# Patient Record
Sex: Female | Born: 1955 | Race: White | Hispanic: No | Marital: Married | Smoking: Never smoker
Health system: Southern US, Community
[De-identification: ages and names within clinical notes are randomized; demographics above are authoritative.]

## PROBLEM LIST (undated history)

## (undated) DIAGNOSIS — G510 Bell's palsy: Secondary | ICD-10-CM

## (undated) DIAGNOSIS — E119 Type 2 diabetes mellitus without complications: Secondary | ICD-10-CM

## (undated) DIAGNOSIS — K219 Gastro-esophageal reflux disease without esophagitis: Secondary | ICD-10-CM

## (undated) DIAGNOSIS — D649 Anemia, unspecified: Secondary | ICD-10-CM

## (undated) DIAGNOSIS — E785 Hyperlipidemia, unspecified: Secondary | ICD-10-CM

## (undated) HISTORY — PX: TONSILLECTOMY: SUR1361

## (undated) HISTORY — DX: Gastro-esophageal reflux disease without esophagitis: K21.9

## (undated) HISTORY — DX: Bell's palsy: G51.0

## (undated) HISTORY — DX: Type 2 diabetes mellitus without complications: E11.9

## (undated) HISTORY — DX: Hyperlipidemia, unspecified: E78.5

---
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
## 2003-06-12 ENCOUNTER — Other Ambulatory Visit: Payer: Self-pay

## 2004-02-14 ENCOUNTER — Ambulatory Visit: Payer: Self-pay | Admitting: Unknown Physician Specialty

## 2006-10-18 ENCOUNTER — Encounter: Payer: Self-pay | Admitting: Physician Assistant

## 2006-11-12 ENCOUNTER — Encounter: Payer: Self-pay | Admitting: Physician Assistant

## 2007-06-17 ENCOUNTER — Ambulatory Visit: Payer: Self-pay | Admitting: Sports Medicine

## 2014-11-28 ENCOUNTER — Encounter: Payer: 59 | Attending: Internal Medicine | Admitting: Dietician

## 2014-11-28 VITALS — Ht 65.0 in | Wt 253.2 lb

## 2014-11-28 DIAGNOSIS — Z713 Dietary counseling and surveillance: Secondary | ICD-10-CM | POA: Diagnosis present

## 2014-11-28 DIAGNOSIS — E669 Obesity, unspecified: Secondary | ICD-10-CM

## 2014-11-28 NOTE — Progress Notes (Signed)
Notes from [facility] employee "self referral" nutrition session: Start time: 1115   End time: 3524  Met with employee to discuss his/her nutritional concerns and diet history. The employee's questions/concerns were also addressed.  We discussed the following topics:  Healthy Eating  Weight Concerns   I also provided the following handouts as reinforcement of the educational session:  Planning a Balanced Meal   Additional Comments: Patient has been losing weight through regular exercise (30-80 minutes, 5 days per week), as well as reducing sugar and starch intake. She is tracking calories, and consuming less than the website-advised 2300kcal. Calculated needs for weight loss at 1800kcal daily, provided patient with meal plan. Discussed protein options for meals and snacks, as patient feels her midday protein intake might be low.    Goals: She will aim for 1800kcal daily, and include protein sources at regular intervals during the day.

## 2015-01-21 ENCOUNTER — Encounter: Payer: Self-pay | Admitting: Physician Assistant

## 2015-01-21 ENCOUNTER — Ambulatory Visit: Payer: Self-pay | Admitting: Physician Assistant

## 2015-01-21 VITALS — BP 130/80 | Temp 98.5°F | Wt 265.0 lb

## 2015-01-21 DIAGNOSIS — J018 Other acute sinusitis: Secondary | ICD-10-CM

## 2015-01-21 DIAGNOSIS — K2100 Gastro-esophageal reflux disease with esophagitis, without bleeding: Secondary | ICD-10-CM

## 2015-01-21 MED ORDER — FLUCONAZOLE 150 MG PO TABS: 150.0000 mg | ORAL_TABLET | Freq: Once | ORAL | Status: DC

## 2015-01-21 MED ORDER — AMOXICILLIN 875 MG PO TABS: 875.0000 mg | ORAL_TABLET | Freq: Two times a day (BID) | ORAL | Status: DC

## 2015-01-21 MED ORDER — OMEPRAZOLE 40 MG PO CPDR: 40.0000 mg | DELAYED_RELEASE_CAPSULE | Freq: Every day | ORAL | Status: DC

## 2015-01-21 NOTE — Progress Notes (Signed)
S: C/o runny nose and congestion for 3 days, no fever, chills, cp/sob, v/d; mucus is green and thick, cough is sporadic, c/o of facial and dental pain. Also med refill on prilosec  Using otc meds:   O: PE: perrl eomi, normocephalic, tms dull, nasal mucosa red and swollen, throat injected, neck supple no lymph, lungs c t a, cv rrr, neuro intact  A:  Acute sinusitis   P:amoxil 862m bid x 10d, dilfucan 1557mone now and 1 in 1 week, refill on omeprazole 4029md , urged pt once again to get pcp;  drink fluids, continue regular meds , use otc meds of choice, return if not improving in 5 days, return earlier if worsening

## 2015-01-21 NOTE — Progress Notes (Deleted)
   Subjective:    [name], female    DOB: 1955/08/05, 59 y.o.   MRN: [id number]  HPI    Review of Systems     Objective:   Physical Exam        Assessment & Plan:

## 2015-02-13 ENCOUNTER — Ambulatory Visit: Payer: Self-pay | Admitting: Physician Assistant

## 2015-02-13 ENCOUNTER — Encounter: Payer: Self-pay | Admitting: Physician Assistant

## 2015-02-13 VITALS — BP 142/90 | HR 100 | Temp 100.1°F

## 2015-02-13 DIAGNOSIS — B029 Zoster without complications: Secondary | ICD-10-CM

## 2015-02-13 DIAGNOSIS — R509 Fever, unspecified: Secondary | ICD-10-CM

## 2015-02-13 LAB — POCT URINALYSIS DIPSTICK
BILIRUBIN UA: NEGATIVE
Blood, UA: NEGATIVE
Glucose, UA: NEGATIVE
KETONES UA: NEGATIVE
Nitrite, UA: NEGATIVE
PH UA: 6
PROTEIN UA: NEGATIVE
Spec Grav, UA: 1.025
Urobilinogen, UA: 0.2

## 2015-02-13 MED ORDER — HYDROCODONE-ACETAMINOPHEN 5-325 MG PO TABS: 1.0000 | ORAL_TABLET | Freq: Four times a day (QID) | ORAL | Status: DC | PRN

## 2015-02-13 MED ORDER — CIPROFLOXACIN HCL 250 MG PO TABS: 250.0000 mg | ORAL_TABLET | Freq: Two times a day (BID) | ORAL | Status: DC

## 2015-02-13 MED ORDER — FAMCICLOVIR 500 MG PO TABS: 500.0000 mg | ORAL_TABLET | Freq: Three times a day (TID) | ORAL | Status: DC

## 2015-02-13 NOTE — Progress Notes (Signed)
S: c/o headache on r side of head, coming up from neck, fever/chills, mild cough started today, hx of pneumonia, no v/d, no eye pain, did get shingles vaccine at age ?52, sx for 2 days  O: vitals wnl except temp elevated at 100, pt appears flushed, scalp tender on r side along temperal side of head, has a few red bumps in area but no vesicles, tms clear, throat clear, neck supple no lymph, lungs c t a, cv rrr, cough is dry; ua + trace leuks  A: fever, uti, ?shingles  P: cipro 254m bid x 7d, famvir 5089mtid, vicodin 5/325 #20 nr; pt instructed to go to ER if fever worsening and headache worsening, signs and sx of meningitis explained. Pt states she understands. Work note given for 11-2 thru 11-4

## 2015-02-18 ENCOUNTER — Ambulatory Visit: Payer: Self-pay | Admitting: Physician Assistant

## 2015-02-18 ENCOUNTER — Encounter: Payer: Self-pay | Admitting: Physician Assistant

## 2015-02-18 VITALS — BP 119/75 | HR 84 | Temp 98.4°F

## 2015-02-18 DIAGNOSIS — J018 Other acute sinusitis: Secondary | ICD-10-CM

## 2015-02-18 DIAGNOSIS — B029 Zoster without complications: Secondary | ICD-10-CM

## 2015-02-18 MED ORDER — OXYCODONE-ACETAMINOPHEN 10-325 MG PO TABS: 1.0000 | ORAL_TABLET | Freq: Three times a day (TID) | ORAL | Status: DC | PRN

## 2015-02-18 MED ORDER — GABAPENTIN 300 MG PO CAPS: 300.0000 mg | ORAL_CAPSULE | Freq: Two times a day (BID) | ORAL | Status: DC

## 2015-02-18 MED ORDER — FLUCONAZOLE 150 MG PO TABS: 150.0000 mg | ORAL_TABLET | Freq: Once | ORAL | Status: DC

## 2015-02-18 NOTE — Progress Notes (Signed)
S: here for recheck, states pain is the same, fever has gone away, taking meds as rx'd , pain meds don't seem to help, has 1.5 days of meds left, now pain is on entire r side of face and back of eye, no v  O: vitals wnl, nad, skin with a few red places in scalp, none on skin, perrl eomi, lungs c t a, cv rrr  A: shingles  P: neurontin 336m bid, percocet 10/325 #20 nr, diflucan for yeast , finish other rx meds, stop vicodin

## 2015-02-27 ENCOUNTER — Encounter: Payer: Self-pay | Admitting: Physician Assistant

## 2015-02-27 ENCOUNTER — Ambulatory Visit: Payer: Self-pay | Admitting: Physician Assistant

## 2015-02-27 VITALS — BP 138/80 | HR 76

## 2015-02-27 DIAGNOSIS — B029 Zoster without complications: Secondary | ICD-10-CM

## 2015-02-27 DIAGNOSIS — B0229 Other postherpetic nervous system involvement: Secondary | ICD-10-CM

## 2015-02-27 MED ORDER — TRAMADOL HCL 50 MG PO TABS: 50.0000 mg | ORAL_TABLET | Freq: Three times a day (TID) | ORAL | Status: DC | PRN

## 2015-02-27 NOTE — Progress Notes (Signed)
S / lesions have dried in , feeling well but has residual pain to scalp not relieved with tylenol  Is ready to RTW but will not be able to take her neurontin in am  O VSS no active lesions  A/ Shingles post herpetic neuralgia P continue neurontin . rx ultram 50 mg i po q 8 hours prn #30 o rf with precautions. RTW Monday Mar 04 2015

## 2015-04-23 ENCOUNTER — Other Ambulatory Visit: Payer: Self-pay | Admitting: Physician Assistant

## 2015-06-17 ENCOUNTER — Ambulatory Visit: Payer: Self-pay | Admitting: Physician Assistant

## 2015-06-17 ENCOUNTER — Encounter: Payer: Self-pay | Admitting: Physician Assistant

## 2015-06-17 VITALS — BP 142/80 | HR 80 | Temp 98.3°F

## 2015-06-17 DIAGNOSIS — J329 Chronic sinusitis, unspecified: Secondary | ICD-10-CM

## 2015-06-17 DIAGNOSIS — J302 Other seasonal allergic rhinitis: Secondary | ICD-10-CM

## 2015-06-18 NOTE — Progress Notes (Signed)
See note in paper chart by Daryll Drown, PAC

## 2015-08-19 ENCOUNTER — Ambulatory Visit: Payer: Self-pay | Admitting: Physician Assistant

## 2015-08-19 ENCOUNTER — Encounter: Payer: Self-pay | Admitting: Physician Assistant

## 2015-08-19 VITALS — BP 154/80 | HR 80 | Temp 98.1°F

## 2015-08-19 DIAGNOSIS — M25532 Pain in left wrist: Secondary | ICD-10-CM

## 2015-08-19 MED ORDER — METHYLPREDNISOLONE 4 MG PO TBPK: ORAL_TABLET | ORAL | Status: DC

## 2015-08-19 NOTE — Progress Notes (Signed)
S: c/o left wrist pain, hx of carpal tunnel, has been wearing splints at night, doesn't wear them at work bc has to wash /sanitize her hands so much, is carrying a clipboard in the same arm while at work, wrist has gotten really inflamed, doesn't really want to go to ortho at this time  O: vitals wnl, nad, left wrist neg for bony tenderness, pt stiffly holding arm, no decreased rom, n/v intact  A: acute wrist pain with hx of carpal tunnel syndrome  P: medrol dose pack, ice, f/u with ortho

## 2015-10-31 DIAGNOSIS — M47816 Spondylosis without myelopathy or radiculopathy, lumbar region: Secondary | ICD-10-CM | POA: Diagnosis not present

## 2015-10-31 DIAGNOSIS — M545 Low back pain: Secondary | ICD-10-CM | POA: Diagnosis not present

## 2015-10-31 DIAGNOSIS — M79651 Pain in right thigh: Secondary | ICD-10-CM | POA: Diagnosis not present

## 2015-10-31 DIAGNOSIS — S79921A Unspecified injury of right thigh, initial encounter: Secondary | ICD-10-CM | POA: Diagnosis not present

## 2015-10-31 DIAGNOSIS — M79604 Pain in right leg: Secondary | ICD-10-CM | POA: Diagnosis not present

## 2016-03-02 DIAGNOSIS — H5213 Myopia, bilateral: Secondary | ICD-10-CM | POA: Diagnosis not present

## 2016-04-28 ENCOUNTER — Encounter: Payer: Self-pay | Admitting: Physician Assistant

## 2016-04-28 ENCOUNTER — Ambulatory Visit: Payer: Self-pay | Admitting: Physician Assistant

## 2016-04-28 VITALS — BP 122/80 | HR 96 | Temp 98.0°F

## 2016-04-28 DIAGNOSIS — J0100 Acute maxillary sinusitis, unspecified: Secondary | ICD-10-CM

## 2016-04-28 MED ORDER — FLUCONAZOLE 150 MG PO TABS: 150.0000 mg | ORAL_TABLET | Freq: Once | ORAL | 0 refills | Status: AC

## 2016-04-28 MED ORDER — AZITHROMYCIN 250 MG PO TABS: ORAL_TABLET | ORAL | 0 refills | Status: DC

## 2016-04-28 NOTE — Progress Notes (Signed)
S: C/o runny nose and congestion for 3 days, cold sx for 2 wekes, no fever, chills, cp/sob, v/d; mucus is green and thick, cough is sporadic, c/o of facial and dental pain.   Using otc meds:   O: PE: vitals wnl, nad, perrl eomi, normocephalic, tms dull, nasal mucosa red and swollen, throat injected, neck supple no lymph, lungs c t a, cv rrr, neuro intact  A:  Acute sinusitis   P: drink fluids, continue regular meds , use otc meds of choice, return if not improving in 5 days, return earlier if worsening, zpack, diflucan if needed

## 2016-05-12 ENCOUNTER — Ambulatory Visit: Payer: Self-pay | Admitting: Physician Assistant

## 2016-05-12 ENCOUNTER — Encounter: Payer: Self-pay | Admitting: Physician Assistant

## 2016-05-12 VITALS — BP 140/82 | HR 98 | Temp 98.0°F

## 2016-05-12 DIAGNOSIS — J0100 Acute maxillary sinusitis, unspecified: Secondary | ICD-10-CM

## 2016-05-12 MED ORDER — AMOXICILLIN 875 MG PO TABS: 875.0000 mg | ORAL_TABLET | Freq: Two times a day (BID) | ORAL | 0 refills | Status: DC

## 2016-05-12 MED ORDER — FLUCONAZOLE 150 MG PO TABS: ORAL_TABLET | ORAL | 0 refills | Status: DC

## 2016-05-12 MED ORDER — PREDNISONE 10 MG PO TABS: 30.0000 mg | ORAL_TABLET | Freq: Every day | ORAL | 0 refills | Status: DC

## 2016-05-12 NOTE — Progress Notes (Signed)
S: C/o runny nose and congestion for 2-3 weeks, no fever, chills, cp/sob, v/d; mucus is green and thick, cough is sporadic, c/o of facial and dental pain. Finished the zpack, felt like it was working a little but didn't clear her up  Using otc meds: saline nose rinse, cough syrup  O: PE: vitals wnl, nad, perrl eomi, normocephalic, tms dull, nasal mucosa red and swollen, throat injected, neck supple no lymph, lungs c t a, cv rrr, neuro intact  A:  Acute sinusitis   P: drink fluids, continue regular meds , use otc meds of choice, return if not improving in 5 days, return earlier if worsening , amoxil 8107m bid x 10d, diflucan x 2, prednisone 314mqd x 3d

## 2016-12-11 ENCOUNTER — Ambulatory Visit: Payer: Self-pay | Admitting: Physician Assistant

## 2016-12-11 VITALS — BP 119/70 | HR 84 | Temp 98.5°F | Resp 16

## 2016-12-11 DIAGNOSIS — E6609 Other obesity due to excess calories: Secondary | ICD-10-CM | POA: Insufficient documentation

## 2016-12-11 DIAGNOSIS — K219 Gastro-esophageal reflux disease without esophagitis: Secondary | ICD-10-CM | POA: Insufficient documentation

## 2016-12-11 DIAGNOSIS — R7309 Other abnormal glucose: Secondary | ICD-10-CM

## 2016-12-11 DIAGNOSIS — J309 Allergic rhinitis, unspecified: Secondary | ICD-10-CM | POA: Insufficient documentation

## 2016-12-11 DIAGNOSIS — R350 Frequency of micturition: Secondary | ICD-10-CM

## 2016-12-11 LAB — POCT URINALYSIS DIPSTICK
Bilirubin, UA: NEGATIVE
Ketones, UA: NEGATIVE
LEUKOCYTES UA: NEGATIVE
NITRITE UA: NEGATIVE
PH UA: 6 (ref 5.0–8.0)
Protein, UA: NEGATIVE
RBC UA: NEGATIVE
Spec Grav, UA: 1.025 (ref 1.010–1.025)
UROBILINOGEN UA: 0.2 U/dL

## 2016-12-11 LAB — GLUCOSE, POCT (MANUAL RESULT ENTRY): POC GLUCOSE: 259 mg/dL — AB (ref 70–99)

## 2016-12-11 NOTE — Progress Notes (Signed)
S: c/o burning, urgency, itching, some white vaginal discharge, ?odor, also some sinus pain and drainage, r ear is stopped up, no fever/chills, no cp/sob, hasn't eaten since 830 this morning, has been drinking more sodas than normal, no hx of diabetes  O: vitals wnl, nad, ent wnl, neck supple no lymph,l ungs c t a, cv rrr, ua 2+ glucose, fsbs 259  A: ?yeast and new onset diabetes, seasonal allergies  P: pt to stop using any sodas, is going to use her husband's glucometer to check fasting and nonfasting glucose levels, return with reading on Thursday and we will repeat fsbs, refer to pcp, otc meds for sinus drainage

## 2016-12-17 ENCOUNTER — Encounter: Payer: Self-pay | Admitting: Physician Assistant

## 2016-12-17 ENCOUNTER — Ambulatory Visit: Payer: Self-pay | Admitting: Physician Assistant

## 2016-12-17 VITALS — BP 118/80 | HR 92 | Temp 98.5°F

## 2016-12-17 DIAGNOSIS — R7309 Other abnormal glucose: Secondary | ICD-10-CM

## 2016-12-17 LAB — GLUCOSE, POCT (MANUAL RESULT ENTRY): POC GLUCOSE: 232 mg/dL — AB (ref 70–99)

## 2016-12-17 MED ORDER — METFORMIN HCL 500 MG PO TABS
500.0000 mg | ORAL_TABLET | Freq: Two times a day (BID) | ORAL | 3 refills | Status: DC
Start: 2016-12-17 — End: 2017-01-15

## 2016-12-17 NOTE — Progress Notes (Signed)
[name] spoke with [facility] appointment scheduled with Dr. Lavon Paganini on 01/15/2017 @ 9:00. Patient has been informed.

## 2016-12-17 NOTE — Progress Notes (Signed)
S: here for fsbs recheck, has list of fasting labs that are in the 110-130s, did not check after eating, had peanut butter toast before coming to clinic today  O: vitals wnl, nad, fsbs 232,   A: elevated glucose  P: pt to get A1C done at hospital, metformin 51m bid, we will set up appt with a pcp

## 2016-12-18 ENCOUNTER — Other Ambulatory Visit
Admission: RE | Admit: 2016-12-18 | Discharge: 2016-12-18 | Disposition: A | Discharge status: Home / Self Care | Payer: 59 | Source: Ambulatory Visit | Attending: Family Medicine | Admitting: Family Medicine

## 2016-12-18 DIAGNOSIS — R7309 Other abnormal glucose: Secondary | ICD-10-CM | POA: Diagnosis not present

## 2016-12-18 LAB — HEMOGLOBIN A1C
Hgb A1c MFr Bld: 9.4 % — ABNORMAL HIGH (ref 4.8–5.6)
Mean Plasma Glucose: 223.08 mg/dL

## 2017-01-15 ENCOUNTER — Encounter: Payer: Self-pay | Admitting: Family Medicine

## 2017-01-15 ENCOUNTER — Ambulatory Visit (INDEPENDENT_AMBULATORY_CARE_PROVIDER_SITE_OTHER): Payer: 59 | Admitting: Family Medicine

## 2017-01-15 VITALS — BP 120/82 | HR 76 | Temp 98.1°F | Resp 16 | Ht 65.0 in | Wt 237.0 lb

## 2017-01-15 DIAGNOSIS — K219 Gastro-esophageal reflux disease without esophagitis: Secondary | ICD-10-CM

## 2017-01-15 DIAGNOSIS — Z124 Encounter for screening for malignant neoplasm of cervix: Secondary | ICD-10-CM | POA: Diagnosis not present

## 2017-01-15 DIAGNOSIS — J309 Allergic rhinitis, unspecified: Secondary | ICD-10-CM

## 2017-01-15 DIAGNOSIS — E669 Obesity, unspecified: Secondary | ICD-10-CM

## 2017-01-15 DIAGNOSIS — Z1159 Encounter for screening for other viral diseases: Secondary | ICD-10-CM | POA: Diagnosis not present

## 2017-01-15 DIAGNOSIS — E119 Type 2 diabetes mellitus without complications: Secondary | ICD-10-CM | POA: Diagnosis not present

## 2017-01-15 DIAGNOSIS — E1169 Type 2 diabetes mellitus with other specified complication: Secondary | ICD-10-CM | POA: Insufficient documentation

## 2017-01-15 DIAGNOSIS — Z7689 Persons encountering health services in other specified circumstances: Secondary | ICD-10-CM

## 2017-01-15 DIAGNOSIS — E785 Hyperlipidemia, unspecified: Secondary | ICD-10-CM

## 2017-01-15 DIAGNOSIS — Z114 Encounter for screening for human immunodeficiency virus [HIV]: Secondary | ICD-10-CM | POA: Diagnosis not present

## 2017-01-15 DIAGNOSIS — Z6839 Body mass index (BMI) 39.0-39.9, adult: Secondary | ICD-10-CM

## 2017-01-15 LAB — POCT UA - MICROALBUMIN: MICROALBUMIN (UR) POC: 50 mg/L

## 2017-01-15 MED ORDER — OMEPRAZOLE 20 MG PO CPDR: 20.0000 mg | DELAYED_RELEASE_CAPSULE | Freq: Every day | ORAL | 3 refills | Status: DC

## 2017-01-15 MED ORDER — METFORMIN HCL 1000 MG PO TABS: 1000.0000 mg | ORAL_TABLET | Freq: Two times a day (BID) | ORAL | 2 refills | Status: DC

## 2017-01-15 NOTE — Assessment & Plan Note (Signed)
Well-controlled Continue Claritin as needed

## 2017-01-15 NOTE — Assessment & Plan Note (Signed)
No lipid panel on file Not on any medications Check CMP, lipid panel today

## 2017-01-15 NOTE — Assessment & Plan Note (Signed)
Well-controlled with occasional Prilosec Discussed dietary changes and patient is trying to not eat after dinner which seems to contribute We will decrease omeprazole dose to 20 mg daily as needed Discuss long-term consequences of PPI therapy including osteoporosis and kidney disease Could consider H2 blocker in the future

## 2017-01-15 NOTE — Patient Instructions (Signed)

## 2017-01-15 NOTE — Assessment & Plan Note (Signed)
Discussed diet and exercise and healthy weight management

## 2017-01-15 NOTE — Progress Notes (Signed)
Patient: Cindy Trujillo, Female    DOB: 06-15-1955, 61 y.o.   MRN: [id number] Visit Date: 01/15/2017  Today's Provider: Lavon Paganini, MD   Chief Complaint  Patient presents with  . Establish Care   Subjective:   Mrs. Fiore is establishing care. She has never had a PCP. 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 She is c/o GERD, and requesting a 90 day prescription of Omeprazole 40 mg. Had her flu shot through the hospital in September. Has not had pap in 10 years. Last colonoscopy was 9 years ago per pt, and was performed by Dr. Vira Agar. She has never had a mammogram, but performs a monthly self breast exam. Denies changes in breast exam. She feels fairly well. She reports she has not been exercising for the last year while she was caring for mother. She reports she is sleeping fairly well. Has not been sleeping well since mother died last month.  T2DM - Checking BG at home: no - does not have meter - plans to sign up for [employer] through work insurance to get free testing supplies - Medications: metformin 518m  - Compliance: was prescribed BID, but patient misunderstood so she was only taking daily - Diet: trying to cut back on sweets and sodas - backslid while mother was terminally ill - Exercise: 3-4 times weekly at gym - backslid while mother was terminally ill - [facility] exam: [facility] f/u exam soon - foot exam: need - microalbumin: needs - denies symptoms of hypoglycemia, polyuria, polydipsia, numbness extremities, foot ulcers/trauma - has not had any nutritional diabetes education   Review of Systems  Constitutional: Positive for activity change. Negative for appetite change, chills, diaphoresis, fatigue, fever and unexpected weight change.  HENT: Positive for postnasal drip and sneezing. Negative for congestion, dental problem, drooling, ear discharge, ear pain, facial swelling, hearing loss, mouth sores, nosebleeds, rhinorrhea, sinus pain,  sinus pressure, sore throat, tinnitus, trouble swallowing and voice change.   Eyes: Negative.   Respiratory: Negative.   Cardiovascular: Positive for leg swelling. Negative for chest pain and palpitations.  Gastrointestinal: Negative.   Endocrine: Negative.   Genitourinary: Negative.   Musculoskeletal: Negative.   Skin: Negative.   Allergic/Immunologic: Negative.   Neurological: Positive for headaches. Negative for dizziness, tremors, seizures, syncope, facial asymmetry, speech difficulty, weakness, light-headedness and numbness.  Hematological: Negative.   Psychiatric/Behavioral: Positive for sleep disturbance. Negative for agitation, behavioral problems, confusion, decreased concentration, dysphoric mood, hallucinations, self-injury and suicidal ideas. The patient is not nervous/anxious and is not hyperactive.     Social History      She  reports that she has never smoked. She has never used smokeless tobacco. She reports that she does not drink alcohol or use drugs.       Social History   Social History  . Marital status: Married    Spouse name: RFrancee Piccolo . Number of children: 0  . Years of education: some college   Occupational History  . [employer]   Social History Main Topics  . Smoking status: Never Smoker  . Smokeless tobacco: Never Used  . Alcohol use No  . Drug use: No  . Sexual activity: Not Currently   Other Topics Concern  . None   Social History Narrative  . None    Past Medical History:  Diagnosis Date  . Diabetes mellitus without complication ([place]   . GERD (gastroesophageal reflux disease)   . Hyperlipidemia  Patient Active Problem List   Diagnosis Date Noted  . Diabetes mellitus without complication ([place])   . Hyperlipidemia   . Allergic rhinitis 12/11/2016  . GERD (gastroesophageal reflux disease) 12/11/2016  . Obesity 12/11/2016    Past Surgical History:  Procedure Laterality Date  . TONSILLECTOMY AND  ADENOIDECTOMY  1968    Family History        Family Status  Relation Status  . Mother Deceased       complications from lung disease  . Father Alive  . Sister Alive  . Brother Alive  . Sister Alive  . Sister Alive  . Brother Alive  . MGM (Not Specified)  . MGF (Not Specified)  . PGM (Not Specified)  . PGF (Not Specified)  . Neg Hx (Not Specified)        Her family history includes COPD in her brother, father, mother, sister, sister, and sister; Cancer in her paternal grandmother; Fibromyalgia in her mother; Healthy in her brother; Heart attack in her maternal grandfather and paternal grandfather; Heart failure in her maternal grandmother; Melanoma in her father; Pulmonary fibrosis in her mother.     Allergies  Allergen Reactions  . Sulfa Antibiotics Hives     Current Outpatient Prescriptions:  .  loratadine (CLARITIN) 10 MG tablet, Take 10 mg by mouth daily., Disp: , Rfl:  .  metFORMIN (GLUCOPHAGE) 1000 MG tablet, Take 1 tablet (1,000 mg total) by mouth 2 (two) times daily with a meal., Disp: 180 tablet, Rfl: 2 .  omeprazole (PRILOSEC) 20 MG capsule, Take 1 capsule (20 mg total) by mouth daily., Disp: 90 capsule, Rfl: 3   Patient Care Team: Virginia Crews, MD as PCP - General (Family Medicine)      Objective:   Vitals: BP 120/82 (BP Location: Left Arm, Patient Position: Sitting, Cuff Size: Large)   Pulse 76   Temp 98.1 F (36.7 C) (Oral)   Resp 16   Ht 5' 5"  (1.651 m)   Wt 237 lb (107.5 kg)   BMI 39.44 kg/m    Vitals:   01/15/17 0908  BP: 120/82  Pulse: 76  Resp: 16  Temp: 98.1 F (36.7 C)  TempSrc: Oral  Weight: 237 lb (107.5 kg)  Height: 5' 5"  (1.651 m)     Physical Exam  Constitutional: She is oriented to person, place, and time. She appears well-developed and well-nourished. No distress.  HENT:  Head: Normocephalic and atraumatic.  Right Ear: External ear normal.  Left Ear: External ear normal.  Nose: Nose normal.  Mouth/Throat:  Oropharynx is clear and moist.  Eyes: Pupils are equal, round, and reactive to light. Conjunctivae are normal. No scleral icterus.  Neck: Neck supple. No thyromegaly present.  Cardiovascular: Normal rate, regular rhythm, normal heart sounds and intact distal pulses.   No murmur heard. Pulmonary/Chest: Effort normal and breath sounds normal. No respiratory distress. She has no wheezes. She has no rales.  Abdominal: Soft. Bowel sounds are normal. She exhibits no distension. There is no tenderness. There is no rebound and no guarding.  Genitourinary:  Genitourinary Comments: GYN:  External genitalia within normal limits.  Vaginal mucosa pink, moist, normal rugae.  Nonfriable cervix without lesions, no discharge or bleeding noted on speculum exam.  Bimanual exam revealed normal, nongravid uterus.  No cervical motion tenderness. No adnexal masses bilaterally.    Musculoskeletal: She exhibits no edema or deformity.  Lymphadenopathy:    She has no cervical adenopathy.  Neurological: She is alert and oriented to  person, place, and time.  Skin: Skin is warm and dry. No rash noted.  Psychiatric: She has a normal mood and affect. Her behavior is normal.  Vitals reviewed.    Depression Screen PHQ 2/9 Scores 01/15/2017  PHQ - 2 Score 0     Assessment & Plan:    Problem List Items Addressed This Visit      Respiratory   Allergic rhinitis    Well-controlled Continue Claritin as needed        Digestive   GERD (gastroesophageal reflux disease)    Well-controlled with occasional Prilosec Discussed dietary changes and patient is trying to not eat after dinner which seems to contribute We will decrease omeprazole dose to 20 mg daily as needed Discuss long-term consequences of PPI therapy including osteoporosis and kidney disease Could consider H2 blocker in the future      Relevant Medications   omeprazole (PRILOSEC) 20 MG capsule   Other Relevant Orders   CBC w/Diff/Platelet      Endocrine   Diabetes mellitus without complication ([place])    New diagnosis for the patient, uncontrolled Last A1c 9.4 Increase metformin to 500 mg twice daily If tolerating without significant GI effects after one week, can increase to 1000 mg twice a day New prescription sent to pharmacy Foot exam completed today Patient schedule eye exam Referral to nutrition for new diabetic teaching Discussed diet and exercise Discuss long-term consequences of uncontrolled diabetes and cold for A1c Urine microalbumin today We will update vaccines at upcoming CPE in one month  follow-up with recheck A1c in 2 months      Relevant Medications   metFORMIN (GLUCOPHAGE) 1000 MG tablet   Other Relevant Orders   Amb ref to Medical Nutrition Therapy-MNT   POCT UA - Microalbumin (Completed)     Other   Obesity    Discussed diet and exercise and healthy weight management      Relevant Medications   metFORMIN (GLUCOPHAGE) 1000 MG tablet   Other Relevant Orders   CBC w/Diff/Platelet   TSH   Hyperlipidemia    No lipid panel on file Not on any medications Check CMP, lipid panel today      Relevant Orders   Lipid panel   COMPLETE METABOLIC PANEL WITH GFR    Other Visit Diagnoses    Encounter to establish care    -  Primary   Relevant Orders   CBC w/Diff/Platelet   Need for hepatitis C screening test       Relevant Orders   Hepatitis C Antibody   Encounter for screening for HIV       Relevant Orders   HIV antibody (with reflex)   Screening for cervical cancer       Relevant Orders   Pap IG and HPV (high risk) DNA detection      Return in about 4 weeks (around 02/12/2017) for CPE.   The entirety of the information documented in the History of Present Illness, Review of Systems and Physical Exam were personally obtained by me. Portions of this information were initially documented by Raquel Sarna Ratchford, CMA and reviewed by me for thoroughness and accuracy.    Lavon Paganini, MD    [facility]

## 2017-01-15 NOTE — Assessment & Plan Note (Signed)
New diagnosis for the patient, uncontrolled Last A1c 9.4 Increase metformin to 500 mg twice daily If tolerating without significant GI effects after one week, can increase to 1000 mg twice a day New prescription sent to pharmacy Foot exam completed today Patient schedule eye exam Referral to nutrition for new diabetic teaching Discussed diet and exercise Discuss long-term consequences of uncontrolled diabetes and cold for A1c Urine microalbumin today We will update vaccines at upcoming CPE in one month  follow-up with recheck A1c in 2 months

## 2017-01-16 LAB — CBC WITH DIFFERENTIAL/PLATELET
BASOS ABS: 18 {cells}/uL (ref 0–200)
Basophils Relative: 0.3 %
EOS PCT: 2.4 %
Eosinophils Absolute: 144 cells/uL (ref 15–500)
HEMATOCRIT: 37.5 % (ref 35.0–45.0)
Hemoglobin: 12.2 g/dL (ref 11.7–15.5)
LYMPHS ABS: 2382 {cells}/uL (ref 850–3900)
MCH: 25.2 pg — ABNORMAL LOW (ref 27.0–33.0)
MCHC: 32.5 g/dL (ref 32.0–36.0)
MCV: 77.3 fL — AB (ref 80.0–100.0)
MPV: 10.4 fL (ref 7.5–12.5)
Monocytes Relative: 6.6 %
NEUTROS PCT: 51 %
Neutro Abs: 3060 cells/uL (ref 1500–7800)
PLATELETS: 141 10*3/uL (ref 140–400)
RBC: 4.85 10*6/uL (ref 3.80–5.10)
RDW: 14.6 % (ref 11.0–15.0)
TOTAL LYMPHOCYTE: 39.7 %
WBC mixed population: 396 cells/uL (ref 200–950)
WBC: 6 10*3/uL (ref 3.8–10.8)

## 2017-01-16 LAB — HEPATITIS C ANTIBODY
HEP C AB: NONREACTIVE
SIGNAL TO CUT-OFF: 0.01 (ref <1.00)

## 2017-01-16 LAB — LIPID PANEL
Cholesterol: 210 mg/dL — ABNORMAL HIGH (ref <200)
HDL: 56 mg/dL (ref >50)
LDL Cholesterol (Calc): 119 mg/dL (calc) — ABNORMAL HIGH
NON-HDL CHOLESTEROL (CALC): 154 mg/dL — AB (ref <130)
Total CHOL/HDL Ratio: 3.8 (calc) (ref <5.0)
Triglycerides: 248 mg/dL — ABNORMAL HIGH (ref <150)

## 2017-01-16 LAB — COMPLETE METABOLIC PANEL WITH GFR
AG Ratio: 1.2 (calc) (ref 1.0–2.5)
ALBUMIN MSPROF: 4.1 g/dL (ref 3.6–5.1)
ALT: 25 U/L (ref 6–29)
AST: 42 U/L — ABNORMAL HIGH (ref 10–35)
Alkaline phosphatase (APISO): 91 U/L (ref 33–130)
BUN: 13 mg/dL (ref 7–25)
CALCIUM: 9.4 mg/dL (ref 8.6–10.4)
CO2: 24 mmol/L (ref 20–32)
Chloride: 105 mmol/L (ref 98–110)
Creat: 0.81 mg/dL (ref 0.50–1.05)
GFR, EST AFRICAN AMERICAN: 92 mL/min/{1.73_m2} (ref >=60)
GFR, EST NON AFRICAN AMERICAN: 79 mL/min/{1.73_m2} (ref >=60)
GLUCOSE: 158 mg/dL — AB (ref 65–99)
Globulin: 3.3 g/dL (calc) (ref 1.9–3.7)
Potassium: 3.8 mmol/L (ref 3.5–5.3)
Sodium: 138 mmol/L (ref 135–146)
TOTAL PROTEIN: 7.4 g/dL (ref 6.1–8.1)
Total Bilirubin: 1.1 mg/dL (ref 0.2–1.2)

## 2017-01-16 LAB — TSH: TSH: 1.84 mIU/L (ref 0.40–4.50)

## 2017-01-16 LAB — HIV ANTIBODY (ROUTINE TESTING W REFLEX): HIV 1&2 Ab, 4th Generation: NONREACTIVE

## 2017-01-18 ENCOUNTER — Telehealth: Payer: Self-pay

## 2017-01-18 DIAGNOSIS — R809 Proteinuria, unspecified: Principal | ICD-10-CM

## 2017-01-18 DIAGNOSIS — E1129 Type 2 diabetes mellitus with other diabetic kidney complication: Secondary | ICD-10-CM

## 2017-01-18 LAB — PAP IG AND HPV HIGH-RISK: HPV DNA HIGH RISK: NOT DETECTED

## 2017-01-18 MED ORDER — LISINOPRIL 5 MG PO TABS: 5.0000 mg | ORAL_TABLET | Freq: Every day | ORAL | 5 refills | Status: DC

## 2017-01-18 NOTE — Telephone Encounter (Signed)
-----   Message from Virginia Crews, MD sent at 01/18/2017  8:32 AM EDT ----- Normal blood counts, electrolytes, kidney function, liver function, thyroid function. Negative hepatitis C and HIV screenings.  Cholesterol is moderately high.  10 year risk of heart disease/stroke is 5.2 %.  Given diabetes and moderately elevated cholesterol, I would recommend starting a cholesterol medicine. We can discuss further follow-up visit and decide whether patient is interested in taking this medication. Urine microalbumin is elevated, which means that the kidneys are taking some damage from diabetes. Patient would benefit from a medicine called lisinopril. She does this for her blood pressure, but does need to protect her kidneys from diabetes. If patient is agreeable to starting this medication, we'll start 5 mg daily prescribed #30 r5.  Possible side effects include decrease in blood pressure, though this is a low dose, persistent nagging nonproductive cough.  She should let us know if she experiences side effects.  Virginia Crews, MD, MPH [facility] 01/18/2017 8:32 AM

## 2017-01-18 NOTE — Telephone Encounter (Signed)
Accidentally closed results note; pt agrees to start Lisinopril, and will consider statin. Lisinopril sent in.

## 2017-01-18 NOTE — Telephone Encounter (Signed)
LMTCB 01/18/2017  Thanks,   -Mickel Baas

## 2017-01-19 ENCOUNTER — Telehealth: Payer: Self-pay

## 2017-01-19 NOTE — Telephone Encounter (Signed)
Left message advising pt. OK per DPR. Asked to call back if yeast infection needs to be treated.

## 2017-01-19 NOTE — Telephone Encounter (Signed)
-----   Message from Virginia Crews, MD sent at 01/19/2017  8:32 AM EDT ----- Normal pap smear.  Repeat in 5 years.  There is some yeast present.  If patient is having symptoms, can treat for yeast infection with diflucan.  Virginia Crews, MD, MPH [facility] 01/19/2017 8:32 AM

## 2017-01-20 ENCOUNTER — Telehealth: Payer: Self-pay | Admitting: Family Medicine

## 2017-01-20 MED ORDER — FLUCONAZOLE 150 MG PO TABS: 150.0000 mg | ORAL_TABLET | Freq: Once | ORAL | 0 refills | Status: AC

## 2017-01-20 NOTE — Telephone Encounter (Signed)
Left message advising pt. OK per DPR.

## 2017-01-20 NOTE — Telephone Encounter (Signed)
Pt returned Emily's call from 01/19/17. Pt stated she got the message Raquel Sarna left about her results and she would like to be treated for the yeast infection via pill form and Rx sent to [place]. Please advise. Thanks TNP

## 2017-01-20 NOTE — Telephone Encounter (Signed)
Please let patient know that Rx sent.  Virginia Crews, MD, MPH [facility] 01/20/2017 11:08 AM

## 2017-02-25 ENCOUNTER — Encounter: Payer: 59 | Attending: Family Medicine | Admitting: Dietician

## 2017-02-25 VITALS — Ht 65.0 in | Wt 240.7 lb

## 2017-02-25 DIAGNOSIS — E119 Type 2 diabetes mellitus without complications: Secondary | ICD-10-CM | POA: Diagnosis not present

## 2017-02-25 DIAGNOSIS — Z713 Dietary counseling and surveillance: Secondary | ICD-10-CM | POA: Diagnosis not present

## 2017-02-25 DIAGNOSIS — Z6839 Body mass index (BMI) 39.0-39.9, adult: Secondary | ICD-10-CM

## 2017-02-25 NOTE — Patient Instructions (Signed)
   Keep working on improved sleep, and exercise/ light activity as able.   Continue with making healthy food choices and regular meal and snack times (every 4-5 hours). Emphasize low-carb veggies, lean proteins, and whole grains.

## 2017-02-25 NOTE — Progress Notes (Signed)
Medical Nutrition Therapy: Visit start time: 1330  end time: 1430  Assessment:  Diagnosis: Type 2 Diabetes Past medical history: GERD, obesity Psychosocial issues/ stress concerns: patient reports high stress level for the past 2 years but currently better. Preferred learning method:  . Auditory . Visual . Hands-on  Current weight: 240.7lbs with shoes  Height: 5'5" Medications, supplements: reconciled list in medical record  Progress and evaluation: Patient reports losing weight over the past several years, from a high of 280+ lbs. She was seen in this office in August 2016 for MNT for weight control and has good basic understanding of nutrition. She since has been caring for ailing mother who recently passed away. Therefore exercise and healthy eating habits have declined to more erratic eating patterns and more unhealthy food and beverage choices. Reports recent reduction in soda and sweet tea intake. Controls portions and food choices when eating out. Reports high level of stress at work. She wakes up often during the night, which has occurred since she began providing around-the-clock care for her mother.   Physical activity: none at this time.  Dietary Intake:  Usual eating pattern includes 2-3 meals and 1-2 snacks per day. Dining out frequency: 4-5 meals per week.  Breakfast: yogurt with granola; fruit Snack: none Lunch: sometimes misses due to work, lack of hunger. Usually brings leftovers. No bread typically unless eating out. Snack: fruit or breakfast bar Supper: some meals out; sometimes cooks I.e. Pork chop, potato, green beans.  Snack: has stopped most snacks; occasionally popcorn. Has stopped chips, candy Beverages: water, occasional fruit juice (grape juice from [employer]); occasional hot chocolate in am if not juice; soda once a day with a meal.  Nutrition Care Education: Topics covered: diabetes, weight control    Weight control: benefits of weight control, reviewed  carbohydrate needs and appropriate nutrient balance in meals.  Advanced nutrition: dining out Diabetes:  goals for BGs, appropriate meal and snack schedule, appropriate carb intake and balance; role of exercise in BG control as well as weight control.  Nutritional Diagnosis:  [place]-2.2 Altered nutrition-related laboratory As related to diabetes.  As evidenced by HbA1C of 7.3%. [place]-3.3 Overweight/obesity As related to history of excess calories and inactivity.  As evidenced by BMI 39.8, patient report.  Intervention: Instruction as noted above.   Set goals with input from patient.   Patient states she knows what she needs to do to control BGs, will be signing on with [facility] [place] program.   No follow-up scheduled at this time; patient will schedule later if needed.  Education Materials given:  . Plate Planner . Food lists-- Carb Counting and Meal Planning ([employer]) . Diabetes and You [employer]) . Goals/ instructions  Learner/ who was taught:  . Patient   Level of understanding: [place] Kitchen Verbalizes/ demonstrates competency  Demonstrated degree of understanding via:   Teach back Learning barriers: . None  Willingness to learn/ readiness for change: . Eager, change in progress  Monitoring and Evaluation:  Dietary intake, exercise, BG control, and body weight      follow up: prn

## 2017-03-08 ENCOUNTER — Ambulatory Visit (INDEPENDENT_AMBULATORY_CARE_PROVIDER_SITE_OTHER): Payer: 59 | Admitting: Family Medicine

## 2017-03-08 ENCOUNTER — Encounter: Payer: Self-pay | Admitting: Family Medicine

## 2017-03-08 VITALS — BP 120/76 | HR 84 | Temp 98.3°F | Resp 16 | Ht 65.0 in | Wt 241.0 lb

## 2017-03-08 DIAGNOSIS — E669 Obesity, unspecified: Secondary | ICD-10-CM | POA: Diagnosis not present

## 2017-03-08 DIAGNOSIS — Z0000 Encounter for general adult medical examination without abnormal findings: Secondary | ICD-10-CM

## 2017-03-08 DIAGNOSIS — Z23 Encounter for immunization: Secondary | ICD-10-CM

## 2017-03-08 DIAGNOSIS — Z6839 Body mass index (BMI) 39.0-39.9, adult: Secondary | ICD-10-CM | POA: Diagnosis not present

## 2017-03-08 DIAGNOSIS — Z5320 Procedure and treatment not carried out because of patient's decision for unspecified reasons: Secondary | ICD-10-CM

## 2017-03-08 DIAGNOSIS — E119 Type 2 diabetes mellitus without complications: Secondary | ICD-10-CM | POA: Diagnosis not present

## 2017-03-08 DIAGNOSIS — Z1211 Encounter for screening for malignant neoplasm of colon: Secondary | ICD-10-CM | POA: Diagnosis not present

## 2017-03-08 NOTE — Patient Instructions (Signed)
Preventive Care 40-64 Years, Female Preventive care refers to lifestyle choices and visits with your health care provider that can promote health and wellness. What does preventive care include?  A yearly physical exam. This is also called an annual well check.  Dental exams once or twice a year.  Routine eye exams. Ask your health care provider how often you should have your eyes checked.  Personal lifestyle choices, including: ? Daily care of your teeth and gums. ? Regular physical activity. ? Eating a healthy diet. ? Avoiding tobacco and drug use. ? Limiting alcohol use. ? Practicing safe sex. ? Taking low-dose aspirin daily starting at age 58. ? Taking vitamin and mineral supplements as recommended by your health care provider. What happens during an annual well check? The services and screenings done by your health care provider during your annual well check will depend on your age, overall health, lifestyle risk factors, and family history of disease. Counseling Your health care provider may ask you questions about your:  Alcohol use.  Tobacco use.  Drug use.  Emotional well-being.  Home and relationship well-being.  Sexual activity.  Eating habits.  Work and work Statistician.  Method of birth control.  Menstrual cycle.  Pregnancy history.  Screening You may have the following tests or measurements:  Height, weight, and BMI.  Blood pressure.  Lipid and cholesterol levels. These may be checked every 5 years, or more frequently if you are over 81 years old.  Skin check.  Lung cancer screening. You may have this screening every year starting at age 78 if you have a 30-pack-year history of smoking and currently smoke or have quit within the past 15 years.  Fecal occult blood test (FOBT) of the stool. You may have this test every year starting at age 65.  Flexible sigmoidoscopy or colonoscopy. You may have a sigmoidoscopy every 5 years or a colonoscopy  every 10 years starting at age 30.  Hepatitis C blood test.  Hepatitis B blood test.  Sexually transmitted disease (STD) testing.  Diabetes screening. This is done by checking your blood sugar (glucose) after you have not eaten for a while (fasting). You may have this done every 1-3 years.  Mammogram. This may be done every 1-2 years. Talk to your health care provider about when you should start having regular mammograms. This may depend on whether you have a family history of breast cancer.  BRCA-related cancer screening. This may be done if you have a family history of breast, ovarian, tubal, or peritoneal cancers.  Pelvic exam and Pap test. This may be done every 3 years starting at age 80. Starting at age 36, this may be done every 5 years if you have a Pap test in combination with an HPV test.  Bone density scan. This is done to screen for osteoporosis. You may have this scan if you are at high risk for osteoporosis.  Discuss your test results, treatment options, and if necessary, the need for more tests with your health care provider. Vaccines Your health care provider may recommend certain vaccines, such as:  Influenza vaccine. This is recommended every year.  Tetanus, diphtheria, and acellular pertussis (Tdap, Td) vaccine. You may need a Td booster every 10 years.  Varicella vaccine. You may need this if you have not been vaccinated.  Zoster vaccine. You may need this after age 5.  Measles, mumps, and rubella (MMR) vaccine. You may need at least one dose of MMR if you were born in  1955 or later. You may also need a second dose.  Pneumococcal 13-valent conjugate (PCV13) vaccine. You may need this if you have certain conditions and were not previously vaccinated.  Pneumococcal polysaccharide (PPSV23) vaccine. You may need one or two doses if you smoke cigarettes or if you have certain conditions.  Meningococcal vaccine. You may need this if you have certain  conditions.  Hepatitis A vaccine. You may need this if you have certain conditions or if you travel or work in places where you may be exposed to hepatitis A.  Hepatitis B vaccine. You may need this if you have certain conditions or if you travel or work in places where you may be exposed to hepatitis B.  Haemophilus influenzae type b (Hib) vaccine. You may need this if you have certain conditions.  Talk to your health care provider about which screenings and vaccines you need and how often you need them. This information is not intended to replace advice given to you by your health care provider. Make sure you discuss any questions you have with your health care provider. Document Released: 04/26/2015 Document Revised: 12/18/2015 Document Reviewed: 01/29/2015 Elsevier Interactive Patient Education  2017 [employer].

## 2017-03-08 NOTE — Progress Notes (Signed)
Patient: Cindy Trujillo, Female    DOB: 07-16-1955, 61 y.o.   MRN: [id number] Visit Date: 03/08/2017  Today's Provider: Lavon Paganini, MD   Chief Complaint  Patient presents with  . Annual Exam   Subjective:    Annual physical exam Cindy Trujillo is a 61 y.o. female who presents today for health maintenance and complete physical. She feels fairly well. States she is fatigued, which is her baseline. She reports exercising none. She reports she is sleeping fairly well. She states her sleep has improved since LOV.  Per LOV 01/15/2017, pt states her last colonoscopy was 9 years ago. We do not have documentation.  Was done by Dr Vira Agar at [facility] [place] never had mammogram.  States that she will not until a more comfortable way of screening is developed. Last pap- 01/18/2017- NIL; HPV negative. Agrees to Pneumovax and TDAP vaccines today -----------------------------------------------------------------   Review of Systems  Constitutional: Negative.   HENT: Negative.   Eyes: Negative.   Respiratory: Negative.   Cardiovascular: Negative.   Gastrointestinal: Negative.   Endocrine: Negative.   Genitourinary: Negative.   Musculoskeletal: Negative.   Skin: Negative.   Allergic/Immunologic: Negative.   Neurological: Negative.   Hematological: Negative.   Psychiatric/Behavioral: Negative.     Social History      She  reports that  has never smoked. she has never used smokeless tobacco. She reports that she does not drink alcohol or use drugs.       Social History   Socioeconomic History  . Marital status: Married    Spouse name: Francee Piccolo  . Number of children: 0  . Years of education: some college  . Highest education level: Not on file  Social Needs  . Financial resource strain: Not on file  . Food insecurity - worry: Not on file  . Food insecurity - inability: Not on file  . Transportation needs - medical: Not on file  . Transportation needs - non-medical:  Not on file  Occupational History  . Occupation: CARE MANGEMENT    Employer: [employer]  Tobacco Use  . Smoking status: Never Smoker  . Smokeless tobacco: Never Used  Substance and Sexual Activity  . Alcohol use: No    Alcohol/week: 0.0 oz  . Drug use: No  . Sexual activity: Not Currently  Other Topics Concern  . Not on file  Social History Narrative  . Not on file    Past Medical History:  Diagnosis Date  . Diabetes mellitus without complication ([place])   . GERD (gastroesophageal reflux disease)   . Hyperlipidemia      Patient Active Problem List   Diagnosis Date Noted  . Diabetes mellitus without complication ([place])   . Hyperlipidemia   . Allergic rhinitis 12/11/2016  . GERD (gastroesophageal reflux disease) 12/11/2016  . Obesity 12/11/2016    Past Surgical History:  Procedure Laterality Date  . TONSILLECTOMY AND ADENOIDECTOMY  1968    Family History        Family Status  Relation Name Status  . Mother  Deceased       complications from lung disease  . Father  Alive  . Sister  Alive  . Brother  Alive  . Sister  Alive  . Sister  Alive  . Brother  Alive  . MGM  (Not Specified)  . MGF  (Not Specified)  . PGM  (Not Specified)  . PGF  (Not Specified)  . Neg Hx  (  Not Specified)        Her family history includes COPD in her brother, father, mother, sister, sister, and sister; Cancer in her paternal grandfather and paternal grandmother; Fibromyalgia in her mother; Healthy in her brother; Heart attack in her maternal grandfather and paternal grandfather; Heart failure in her maternal grandmother; Melanoma in her father; Pulmonary fibrosis in her mother.     Allergies  Allergen Reactions  . Sulfa Antibiotics Hives     Current Outpatient Medications:  .  lisinopril (PRINIVIL,ZESTRIL) 5 MG tablet, Take 1 tablet (5 mg total) by mouth daily., Disp: 30 tablet, Rfl: 5 .  loratadine (CLARITIN) 10 MG tablet, Take 10 mg by mouth daily., Disp: , Rfl:   .  metFORMIN (GLUCOPHAGE) 500 MG tablet, Take 500 mg by mouth 2 (two) times daily with a meal. , Disp: , Rfl: 3 .  omeprazole (PRILOSEC) 20 MG capsule, Take 1 capsule (20 mg total) by mouth daily., Disp: 90 capsule, Rfl: 3   Patient Care Team: Virginia Crews, MD as PCP - General (Family Medicine)      Objective:   Vitals: BP 120/76 (BP Location: Left Arm, Patient Position: Sitting, Cuff Size: Large)   Pulse 84   Temp 98.3 F (36.8 C) (Oral)   Resp 16   Ht 5' 5"  (1.651 m)   Wt 241 lb (109.3 kg)   BMI 40.10 kg/m    Vitals:   03/08/17 0859  BP: 120/76  Pulse: 84  Resp: 16  Temp: 98.3 F (36.8 C)  TempSrc: Oral  Weight: 241 lb (109.3 kg)  Height: 5' 5"  (1.651 m)     Physical Exam  Constitutional: She is oriented to person, place, and time. She appears well-developed and well-nourished. No distress.  HENT:  Head: Normocephalic and atraumatic.  Right Ear: External ear normal.  Left Ear: External ear normal.  Nose: Nose normal.  Mouth/Throat: Oropharynx is clear and moist.  Eyes: Conjunctivae and EOM are normal. Pupils are equal, round, and reactive to light. No scleral icterus.  Neck: Neck supple. No thyromegaly present.  Cardiovascular: Normal rate, regular rhythm, normal heart sounds and intact distal pulses.  No murmur heard. Pulmonary/Chest: Effort normal and breath sounds normal. No respiratory distress. She has no wheezes. She has no rales.  Breasts: breasts appear normal, no suspicious masses, no skin or nipple changes or axillary nodes.  Abdominal: Soft. Bowel sounds are normal. She exhibits no distension. There is no tenderness. There is no rebound and no guarding.  Musculoskeletal: She exhibits no edema or deformity.  Lymphadenopathy:    She has no cervical adenopathy.  Neurological: She is alert and oriented to person, place, and time.  Skin: Skin is warm and dry. No rash noted.  Psychiatric: She has a normal mood and affect. Her behavior is normal.    Vitals reviewed.    Depression Screen PHQ 2/9 Scores 02/25/2017 01/15/2017  PHQ - 2 Score 0 0      Assessment & Plan:     Routine Health Maintenance and Physical Exam  Exercise Activities and Dietary recommendations Goals    None      Immunization History  Administered Date(s) Administered  . Influenza,inj,Quad PF,6+ Mos 01/05/2017  . Pneumococcal Polysaccharide-23 03/08/2017  . Tdap 03/08/2017    Health Maintenance  Topic Date Due  . PNEUMOCOCCAL POLYSACCHARIDE VACCINE (1) 12/29/1959  . FOOT EXAM  12/29/1967  . OPHTHALMOLOGY EXAM  12/29/1967  . TETANUS/TDAP  12/28/1976  . MAMMOGRAM  12/29/2007  . COLONOSCOPY  12/29/2007  . HEMOGLOBIN A1C  06/17/2017  . PAP SMEAR  01/16/2020  . INFLUENZA VACCINE  Completed  . Hepatitis C Screening  Completed  . HIV Screening  Completed     Discussed health benefits of physical activity, and encouraged her to engage in regular exercise appropriate for her age and condition.  Referral to GI for colon cancer screening Patient declines breast cancer screening Updated vaccines today   Problem List Items Addressed This Visit      Endocrine   Diabetes mellitus without complication ([place])   Relevant Orders   Pneumococcal polysaccharide vaccine 23-valent greater than or equal to 2yo subcutaneous/IM (Completed)     Other   Obesity    Other Visit Diagnoses    Encounter for annual physical exam    -  Primary   Mammogram declined       Screening for colon cancer       Relevant Orders   Ambulatory referral to Gastroenterology   Need for pneumococcal vaccination       Relevant Orders   Pneumococcal polysaccharide vaccine 23-valent greater than or equal to 2yo subcutaneous/IM (Completed)   Need for Tdap vaccination       Relevant Orders   Tdap vaccine greater than or equal to 7yo IM (Completed)      Follow-up in 1 month for DM - will need foot exam and ophtho  referral --------------------------------------------------------------------  The entirety of the information documented in the History of Present Illness, Review of Systems and Physical Exam were personally obtained by me. Portions of this information were initially documented by Raquel Sarna Ratchford, CMA and reviewed by me for thoroughness and accuracy.    Lavon Paganini, MD  [facility]

## 2017-03-12 DIAGNOSIS — H5213 Myopia, bilateral: Secondary | ICD-10-CM | POA: Diagnosis not present

## 2017-03-12 DIAGNOSIS — E119 Type 2 diabetes mellitus without complications: Secondary | ICD-10-CM | POA: Diagnosis not present

## 2017-03-12 LAB — HM DIABETES EYE EXAM

## 2017-03-15 ENCOUNTER — Encounter: Payer: Self-pay | Admitting: Family Medicine

## 2017-03-22 ENCOUNTER — Ambulatory Visit: Payer: Self-pay | Admitting: Family Medicine

## 2017-06-14 DIAGNOSIS — Z1211 Encounter for screening for malignant neoplasm of colon: Secondary | ICD-10-CM | POA: Diagnosis not present

## 2017-06-28 ENCOUNTER — Ambulatory Visit (INDEPENDENT_AMBULATORY_CARE_PROVIDER_SITE_OTHER): Payer: Self-pay | Admitting: Family Medicine

## 2017-06-28 ENCOUNTER — Encounter: Payer: Self-pay | Admitting: Family Medicine

## 2017-06-28 VITALS — BP 119/74 | HR 83 | Temp 98.0°F | Wt 241.0 lb

## 2017-06-28 DIAGNOSIS — J40 Bronchitis, not specified as acute or chronic: Secondary | ICD-10-CM

## 2017-06-28 DIAGNOSIS — R059 Cough, unspecified: Secondary | ICD-10-CM

## 2017-06-28 DIAGNOSIS — R05 Cough: Secondary | ICD-10-CM

## 2017-06-28 MED ORDER — FLUCONAZOLE 150 MG PO TABS: 150.0000 mg | ORAL_TABLET | Freq: Once | ORAL | 0 refills | Status: AC

## 2017-06-28 MED ORDER — BENZONATATE 100 MG PO CAPS: 100.0000 mg | ORAL_CAPSULE | Freq: Three times a day (TID) | ORAL | 0 refills | Status: DC | PRN

## 2017-06-28 MED ORDER — AMOXICILLIN 875 MG PO TABS: 875.0000 mg | ORAL_TABLET | Freq: Two times a day (BID) | ORAL | 0 refills | Status: DC

## 2017-06-28 NOTE — Patient Instructions (Signed)

## 2017-06-28 NOTE — Progress Notes (Signed)
Sorethroat,cough green/yellow mucus/drainage x1wk OTC: tylenol, delsym,cough drops

## 2017-06-28 NOTE — Progress Notes (Signed)
Cindy Trujillo is a 63 y.o. female who presents with 7 days of cough and congestion symptoms. She has attempted to self treat with OTC medications without real relief. She has multiple co-morbid conditions like diabetes, GERD, hyperlipidemia, and allergic rhinitis.  Review of Systems  Constitutional: Negative for chills, fever and malaise/fatigue.  HENT: Positive for congestion. Negative for sinus pain and sore throat.   Eyes: Negative.   Respiratory: Positive for cough and sputum production.   Cardiovascular: Negative.   Gastrointestinal: Negative.   Genitourinary: Negative.   Musculoskeletal: Negative.   Neurological: Negative.   Endo/Heme/Allergies: Negative.   Psychiatric/Behavioral: Negative.     O: Vitals:   06/28/17 1247  BP: 119/74  Pulse: 83  Temp: 98 F (36.7 C)  SpO2: 96%   Physical Exam  Constitutional: She is oriented to person, place, and time. Vital signs are normal. She appears well-developed and well-nourished.  HENT:  Head: Normocephalic.  Right Ear: Hearing, tympanic membrane, external ear and ear canal normal.  Left Ear: Hearing, tympanic membrane, external ear and ear canal normal.  Nose: Rhinorrhea present.  Mouth/Throat: Uvula is midline and oropharynx is clear and moist.  Eyes: Pupils are equal, round, and reactive to light.  Neck: Normal range of motion. Neck supple.  Cardiovascular: Normal rate and regular rhythm.  Pulmonary/Chest: Effort normal. She has rales in the right lower field and the left lower field.  Abdominal: Soft. Bowel sounds are normal.  Musculoskeletal: Normal range of motion.  Lymphadenopathy:       Head (right side): Submandibular adenopathy present.       Head (left side): Submandibular adenopathy present.  Neurological: She is alert and oriented to person, place, and time.  Skin: Skin is warm and dry.     A: 1. Bronchitis   2. Cough     P: Treatment plan and medications use and indications discussed with patient who  agrees with POC and verbalized understanding. No acute concerns on exam. F/U if needed PRN. 1. Bronchitis - amoxicillin (AMOXIL) 875 MG tablet; Take 1 tablet (875 mg total) by mouth 2 (two) times daily.  2. Cough - benzonatate (TESSALON) 100 MG capsule; Take 1-2 capsules (100-200 mg total) by mouth 3 (three) times daily as needed for cough.  Other orders - ferrous sulfate 325 (65 FE) MG tablet; Take by mouth.

## 2017-07-01 ENCOUNTER — Telehealth: Payer: Self-pay | Admitting: Emergency Medicine

## 2017-07-01 NOTE — Telephone Encounter (Signed)
Left message follow-up call from [facility] visit with provider

## 2017-07-20 ENCOUNTER — Other Ambulatory Visit: Payer: Self-pay | Admitting: Family Medicine

## 2017-07-20 DIAGNOSIS — E1129 Type 2 diabetes mellitus with other diabetic kidney complication: Secondary | ICD-10-CM

## 2017-07-20 DIAGNOSIS — R809 Proteinuria, unspecified: Principal | ICD-10-CM

## 2017-07-20 NOTE — Telephone Encounter (Signed)
Left message to schedule FU.

## 2017-07-20 NOTE — Telephone Encounter (Signed)
1 month refill. Let's get her in for f/u soon.  Cindy Crews, MD, MPH [facility] 07/20/2017 9:52 AM

## 2017-07-20 NOTE — Telephone Encounter (Signed)
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 Was supposed to FU for DM in December, but cancelled appointment.

## 2017-08-20 ENCOUNTER — Encounter: Payer: Self-pay | Admitting: Emergency Medicine

## 2017-08-23 ENCOUNTER — Encounter
Admission: RE | Disposition: A | Discharge status: Home / Self Care | Payer: Self-pay | Source: Ambulatory Visit | Attending: Unknown Physician Specialty

## 2017-08-23 ENCOUNTER — Ambulatory Visit
Admission: RE | Admit: 2017-08-23 | Discharge: 2017-08-23 | Disposition: A | Discharge status: Home / Self Care | Payer: 59 | Source: Ambulatory Visit | Attending: Unknown Physician Specialty | Admitting: Unknown Physician Specialty

## 2017-08-23 ENCOUNTER — Ambulatory Visit: Payer: 59 | Admitting: Anesthesiology

## 2017-08-23 ENCOUNTER — Encounter: Payer: Self-pay | Admitting: *Deleted

## 2017-08-23 DIAGNOSIS — K5730 Diverticulosis of large intestine without perforation or abscess without bleeding: Secondary | ICD-10-CM | POA: Diagnosis not present

## 2017-08-23 DIAGNOSIS — E119 Type 2 diabetes mellitus without complications: Secondary | ICD-10-CM | POA: Insufficient documentation

## 2017-08-23 DIAGNOSIS — K5790 Diverticulosis of intestine, part unspecified, without perforation or abscess without bleeding: Secondary | ICD-10-CM | POA: Diagnosis not present

## 2017-08-23 DIAGNOSIS — Z79899 Other long term (current) drug therapy: Secondary | ICD-10-CM | POA: Insufficient documentation

## 2017-08-23 DIAGNOSIS — Z882 Allergy status to sulfonamides status: Secondary | ICD-10-CM | POA: Insufficient documentation

## 2017-08-23 DIAGNOSIS — Z1211 Encounter for screening for malignant neoplasm of colon: Secondary | ICD-10-CM | POA: Diagnosis not present

## 2017-08-23 DIAGNOSIS — K640 First degree hemorrhoids: Secondary | ICD-10-CM | POA: Diagnosis not present

## 2017-08-23 DIAGNOSIS — Z6841 Body Mass Index (BMI) 40.0 and over, adult: Secondary | ICD-10-CM | POA: Insufficient documentation

## 2017-08-23 DIAGNOSIS — K219 Gastro-esophageal reflux disease without esophagitis: Secondary | ICD-10-CM | POA: Insufficient documentation

## 2017-08-23 DIAGNOSIS — E785 Hyperlipidemia, unspecified: Secondary | ICD-10-CM | POA: Diagnosis not present

## 2017-08-23 DIAGNOSIS — K648 Other hemorrhoids: Secondary | ICD-10-CM | POA: Diagnosis not present

## 2017-08-23 DIAGNOSIS — Z7984 Long term (current) use of oral hypoglycemic drugs: Secondary | ICD-10-CM | POA: Diagnosis not present

## 2017-08-23 HISTORY — PX: COLONOSCOPY WITH PROPOFOL: SHX5780

## 2017-08-23 LAB — GLUCOSE, CAPILLARY: Glucose-Capillary: 205 mg/dL — ABNORMAL HIGH (ref 65–99)

## 2017-08-23 SURGERY — COLONOSCOPY WITH PROPOFOL
Anesthesia: General

## 2017-08-23 MED ORDER — EPHEDRINE SULFATE 50 MG/ML IJ SOLN
INTRAMUSCULAR | Status: AC
  Filled 2017-08-23: qty 1

## 2017-08-23 MED ORDER — LIDOCAINE HCL (PF) 2 % IJ SOLN
INTRAMUSCULAR | Status: DC | PRN
  Administered 2017-08-23: 80 mg

## 2017-08-23 MED ORDER — FENTANYL CITRATE (PF) 100 MCG/2ML IJ SOLN
INTRAMUSCULAR | Status: DC | PRN
  Administered 2017-08-23: 25 ug via INTRAVENOUS
  Administered 2017-08-23: 50 ug via INTRAVENOUS
  Administered 2017-08-23: 25 ug via INTRAVENOUS

## 2017-08-23 MED ORDER — PROPOFOL 500 MG/50ML IV EMUL
INTRAVENOUS | Status: DC | PRN
  Administered 2017-08-23: 75 ug/kg/min via INTRAVENOUS

## 2017-08-23 MED ORDER — PHENYLEPHRINE HCL 10 MG/ML IJ SOLN
INTRAMUSCULAR | Status: AC
  Filled 2017-08-23: qty 1

## 2017-08-23 MED ORDER — MIDAZOLAM HCL 2 MG/2ML IJ SOLN
INTRAMUSCULAR | Status: AC
  Filled 2017-08-23: qty 2

## 2017-08-23 MED ORDER — SODIUM CHLORIDE 0.9 % IV SOLN
INTRAVENOUS | Status: DC
  Administered 2017-08-23: 1000 mL via INTRAVENOUS

## 2017-08-23 MED ORDER — MIDAZOLAM HCL 5 MG/5ML IJ SOLN
INTRAMUSCULAR | Status: DC | PRN
  Administered 2017-08-23: 2 mg via INTRAVENOUS

## 2017-08-23 MED ORDER — FENTANYL CITRATE (PF) 100 MCG/2ML IJ SOLN
INTRAMUSCULAR | Status: AC
  Filled 2017-08-23: qty 2

## 2017-08-23 MED ORDER — SODIUM CHLORIDE 0.9 % IV SOLN: INTRAVENOUS | Status: DC

## 2017-08-23 MED ORDER — PROPOFOL 500 MG/50ML IV EMUL
INTRAVENOUS | Status: AC
  Filled 2017-08-23: qty 50

## 2017-08-23 MED ORDER — LIDOCAINE HCL (PF) 2 % IJ SOLN
INTRAMUSCULAR | Status: AC
  Filled 2017-08-23: qty 10

## 2017-08-23 MED ORDER — PROPOFOL 10 MG/ML IV BOLUS
INTRAVENOUS | Status: DC | PRN
  Administered 2017-08-23: 30 mg via INTRAVENOUS
  Administered 2017-08-23: 20 mg via INTRAVENOUS

## 2017-08-23 NOTE — H&P (Signed)
Primary Care Physician:  Virginia Crews, MD Primary Gastroenterologist:  Dr. Vira Agar  Pre-Procedure History & Physical: HPI:  Cindy Trujillo is a 62 y.o. female is here for an colonoscopy.  Done for colon cancer screening.   Past Medical History:  Diagnosis Date  . Diabetes mellitus without complication ([place])   . GERD (gastroesophageal reflux disease)   . Hyperlipidemia     Past Surgical History:  Procedure Laterality Date  . TONSILLECTOMY AND ADENOIDECTOMY  1968    Prior to Admission medications   Medication Sig Start Date End Date Taking? Authorizing Provider  benzonatate (TESSALON) 100 MG capsule Take 1-2 capsules (100-200 mg total) by mouth 3 (three) times daily as needed for cough. 06/28/17   Shella Maxim, NP  ferrous sulfate 325 (65 FE) MG tablet Take by mouth.    [provider]  lisinopril (PRINIVIL,ZESTRIL) 5 MG tablet TAKE 1 TABLET (5 MG TOTAL) BY MOUTH DAILY. 07/20/17   Bacigalupo, Dionne Bucy, MD  loratadine (CLARITIN) 10 MG tablet Take 10 mg by mouth daily.    [provider]  metFORMIN (GLUCOPHAGE) 500 MG tablet Take 500 mg by mouth 2 (two) times daily with a meal.  12/17/16   [provider]  omeprazole (PRILOSEC) 20 MG capsule Take 1 capsule (20 mg total) by mouth daily. 01/15/17   Virginia Crews, MD    Allergies as of 08/04/2017 - Review Complete 06/28/2017  Allergen Reaction Noted  . Sulfa antibiotics Hives 01/21/2015    Family History  Problem Relation Age of Onset  . COPD Mother        heavy smoker  . Pulmonary fibrosis Mother   . Fibromyalgia Mother   . COPD Father   . Melanoma Father   . COPD Sister   . COPD Brother   . COPD Sister   . COPD Sister   . Healthy Brother   . Heart failure Maternal Grandmother   . Heart attack Maternal Grandfather   . Cancer Paternal Grandmother        unsure of type, had mets in colon  . Heart attack Paternal Grandfather   . Cancer Paternal Grandfather        unknown  . Breast  cancer Neg Hx   . Cervical cancer Neg Hx     Social History   Socioeconomic History  . Marital status: Married    Spouse name: Francee Piccolo  . Number of children: 0  . Years of education: some college  . Highest education level: Not on file  Occupational History  . Occupation: CARE MANGEMENT    Employer: [employer]  Social Needs  . Financial resource strain: Not on file  . Food insecurity:    Worry: Not on file    Inability: Not on file  . Transportation needs:    Medical: Not on file    Non-medical: Not on file  Tobacco Use  . Smoking status: Never Smoker  . Smokeless tobacco: Never Used  Substance and Sexual Activity  . Alcohol use: No    Alcohol/week: 0.0 oz  . Drug use: No  . Sexual activity: Not Currently  Lifestyle  . Physical activity:    Days per week: Not on file    Minutes per session: Not on file  . Stress: Not on file  Relationships  . Social connections:    Talks on phone: Not on file    Gets together: Not on file    Attends religious service: Not on  file    Active member of club or organization: Not on file    Attends meetings of clubs or organizations: Not on file    Relationship status: Not on file  . Intimate partner violence:    Fear of current or ex partner: Not on file    Emotionally abused: Not on file    Physically abused: Not on file    Forced sexual activity: Not on file  Other Topics Concern  . Not on file  Social History Narrative  . Not on file    Review of Systems: See HPI, otherwise negative ROS  Physical Exam: BP (!) 143/81   Pulse 93   Temp (!) 97 F (36.1 C) (Tympanic)   Resp 16   Ht 5' 5"  (1.651 m)   SpO2 97%   BMI 40.10 kg/m  General:   Alert,  pleasant and cooperative in NAD Head:  Normocephalic and atraumatic. Neck:  Supple; no masses or thyromegaly. Lungs:  Clear throughout to auscultation.    Heart:  Regular rate and rhythm. Abdomen:  Soft, nontender and nondistended. Normal bowel sounds,  without guarding, and without rebound.   Neurologic:  Alert and  oriented x4;  grossly normal neurologically.  Impression/Plan: Cindy Trujillo is here for an colonoscopy to be performed for colon cancer screening  Risks, benefits, limitations, and alternatives regarding  colonoscopy have been reviewed with the patient.  Questions have been answered.  All parties agreeable.   Gaylyn Cheers, MD  08/23/2017, 7:30 AM

## 2017-08-23 NOTE — Anesthesia Preprocedure Evaluation (Signed)
Anesthesia Evaluation  Patient identified by MRN, date of birth, ID band Patient awake    Reviewed: Allergy & Precautions, H&P , NPO status , Patient's Chart, lab work & pertinent test results, reviewed documented beta blocker date and time   History of Anesthesia Complications Negative for: history of anesthetic complications  Airway Mallampati: II  TM Distance: >3 FB Neck ROM: full    Dental  (+) Caps, Dental Advidsory Given, Missing, Teeth Intact   Pulmonary neg pulmonary ROS,           Cardiovascular Exercise Tolerance: Good negative cardio ROS       Neuro/Psych negative neurological ROS  negative psych ROS   GI/Hepatic Neg liver ROS, GERD  ,  Endo/Other  diabetes, Oral Hypoglycemic AgentsMorbid obesity  Renal/GU negative Renal ROS  negative genitourinary   Musculoskeletal   Abdominal   Peds  Hematology negative hematology ROS (+)   Anesthesia Other Findings Past Medical History: No date: Diabetes mellitus without complication ([facility]) No date: GERD (gastroesophageal reflux disease) No date: Hyperlipidemia   Reproductive/Obstetrics negative OB ROS                             Anesthesia Physical Anesthesia Plan  ASA: III  Anesthesia Plan: General   Post-op Pain Management:    Induction: Intravenous  PONV Risk Score and Plan: 3 and Propofol infusion  Airway Management Planned: Natural Airway and Nasal Cannula  Additional Equipment:   Intra-op Plan:   Post-operative Plan:   Informed Consent: I have reviewed the patients History and Physical, chart, labs and discussed the procedure including the risks, benefits and alternatives for the proposed anesthesia with the patient or authorized representative who has indicated his/her understanding and acceptance.   Dental Advisory Given  Plan Discussed with: Anesthesiologist, CRNA and Surgeon  Anesthesia Plan Comments:          Anesthesia Quick Evaluation

## 2017-08-23 NOTE — Op Note (Signed)
[facility] Gastroenterology Patient Name: Cindy Trujillo Procedure Date: 08/23/2017 7:20 AM MRN: [id number] Account #: [id number] Date of Birth: 1955/06/10 Admit Type: Outpatient Age: 62 Room: [facility] ENDO ROOM 3 Gender: Female Note Status: Finalized Procedure:            Colonoscopy Indications:          Screening for colorectal malignant neoplasm Providers:            Manya Silvas, MD Referring MD:         Dionne Bucy. Bacigalupo (Referring MD) Medicines:            Propofol per Anesthesia Complications:        No immediate complications. Procedure:            Pre-Anesthesia Assessment:                       - After reviewing the risks and benefits, the patient                        was deemed in satisfactory condition to undergo the                        procedure.                       After obtaining informed consent, the colonoscope was                        passed under direct vision. Throughout the procedure,                        the patient's blood pressure, pulse, and oxygen                        saturations were monitored continuously. The                        Colonoscope was introduced through the anus and                        advanced to the the cecum, identified by appendiceal                        orifice and ileocecal valve. The colonoscopy was                        performed without difficulty. The patient tolerated the                        procedure well. The quality of the bowel preparation                        was good. Findings:      Multiple medium-mouthed diverticula were found in the sigmoid colon and       descending colon.      Internal hemorrhoids were found during endoscopy. The hemorrhoids were       small and Grade I (internal hemorrhoids that do not prolapse).      The exam was otherwise without abnormality. Impression:           - Diverticulosis in the sigmoid colon and  in the                        descending  colon.                       - Internal hemorrhoids.                       - The examination was otherwise normal.                       - No specimens collected. Recommendation:       - Repeat colonoscopy in 10 years for screening purposes. Manya Silvas, MD 08/23/2017 7:54:19 AM This report has been signed electronically. Number of Addenda: 0 Note Initiated On: 08/23/2017 7:20 AM Scope Withdrawal Time: 0 hours 8 minutes 3 seconds  Total Procedure Duration: 0 hours 13 minutes 35 seconds       [facility]

## 2017-08-23 NOTE — Anesthesia Postprocedure Evaluation (Signed)
Anesthesia Post Note  Patient: Cindy Trujillo  Procedure(s) Performed: COLONOSCOPY WITH PROPOFOL (N/A )  Patient location during evaluation: Endoscopy Anesthesia Type: General Level of consciousness: awake and alert Pain management: pain level controlled Vital Signs Assessment: post-procedure vital signs reviewed and stable Respiratory status: spontaneous breathing, nonlabored ventilation, respiratory function stable and patient connected to nasal cannula oxygen Cardiovascular status: blood pressure returned to baseline and stable Postop Assessment: no apparent nausea or vomiting Anesthetic complications: no     Last Vitals:  Vitals:   08/23/17 0815 08/23/17 0825  BP: 113/69 110/79  Pulse: 82 72  Resp: (!) 26 (!) 23  Temp:    SpO2: 93% 94%    Last Pain:  Vitals:   08/23/17 0825  TempSrc:   PainSc: 0-No pain                 Martha Clan

## 2017-08-23 NOTE — Anesthesia Post-op Follow-up Note (Signed)
Anesthesia QCDR form completed.        

## 2017-08-23 NOTE — Transfer of Care (Signed)
Immediate Anesthesia Transfer of Care Note  Patient: Chrisanne A Padgett  Procedure(s) Performed: COLONOSCOPY WITH PROPOFOL (N/A )  Patient Location: PACU  Anesthesia Type:General  Level of Consciousness: sedated  Airway & Oxygen Therapy: Patient Spontanous Breathing and Patient connected to nasal cannula oxygen  Post-op Assessment: Report given to RN and Post -op Vital signs reviewed and stable  Post vital signs: Reviewed and stable  Last Vitals:  Vitals Value Taken Time  BP 119/74 08/23/2017  7:55 AM  Temp 36.1 C 08/23/2017  7:55 AM  Pulse 80 08/23/2017  7:57 AM  Resp 27 08/23/2017  7:57 AM  SpO2 96 % 08/23/2017  7:57 AM  Vitals shown include unvalidated device data.  Last Pain:  Vitals:   08/23/17 0755  TempSrc:   PainSc: Asleep         Complications: No apparent anesthesia complications

## 2017-08-24 ENCOUNTER — Ambulatory Visit: Payer: 59 | Admitting: Family Medicine

## 2017-08-25 ENCOUNTER — Encounter: Payer: Self-pay | Admitting: Unknown Physician Specialty

## 2017-08-26 ENCOUNTER — Encounter: Payer: Self-pay | Admitting: Family Medicine

## 2017-08-27 ENCOUNTER — Encounter: Payer: Self-pay | Admitting: Family Medicine

## 2017-08-27 ENCOUNTER — Ambulatory Visit (INDEPENDENT_AMBULATORY_CARE_PROVIDER_SITE_OTHER): Payer: 59 | Admitting: Family Medicine

## 2017-08-27 VITALS — BP 130/76 | HR 79 | Temp 98.1°F | Resp 16 | Wt 244.0 lb

## 2017-08-27 DIAGNOSIS — J309 Allergic rhinitis, unspecified: Secondary | ICD-10-CM

## 2017-08-27 DIAGNOSIS — E119 Type 2 diabetes mellitus without complications: Secondary | ICD-10-CM

## 2017-08-27 DIAGNOSIS — R809 Proteinuria, unspecified: Secondary | ICD-10-CM | POA: Diagnosis not present

## 2017-08-27 DIAGNOSIS — I8393 Asymptomatic varicose veins of bilateral lower extremities: Secondary | ICD-10-CM

## 2017-08-27 DIAGNOSIS — K219 Gastro-esophageal reflux disease without esophagitis: Secondary | ICD-10-CM | POA: Diagnosis not present

## 2017-08-27 DIAGNOSIS — E1129 Type 2 diabetes mellitus with other diabetic kidney complication: Secondary | ICD-10-CM | POA: Diagnosis not present

## 2017-08-27 LAB — POCT GLYCOSYLATED HEMOGLOBIN (HGB A1C): HEMOGLOBIN A1C: 8.8

## 2017-08-27 MED ORDER — LISINOPRIL 5 MG PO TABS: 5.0000 mg | ORAL_TABLET | Freq: Every day | ORAL | 3 refills | Status: DC

## 2017-08-27 MED ORDER — METFORMIN HCL 1000 MG PO TABS: 1000.0000 mg | ORAL_TABLET | Freq: Two times a day (BID) | ORAL | 3 refills | Status: DC

## 2017-08-27 MED ORDER — OMEPRAZOLE 20 MG PO CPDR: 20.0000 mg | DELAYED_RELEASE_CAPSULE | Freq: Every day | ORAL | 3 refills | Status: DC

## 2017-08-27 MED ORDER — GLUCOSE BLOOD VI STRP: ORAL_STRIP | 3 refills | Status: DC

## 2017-08-27 MED ORDER — LORATADINE 10 MG PO TABS: 10.0000 mg | ORAL_TABLET | Freq: Every day | ORAL | 3 refills | Status: DC

## 2017-08-27 NOTE — Assessment & Plan Note (Signed)
Improving A1c decreasing from 9.4 to 8.8 Increase Metformin to 1013m BID Foot exam completed today UTD on eye exam, vaccines Seeing dietician Continue lisinopril for microalbuminuria F/u in 3 months

## 2017-08-27 NOTE — Assessment & Plan Note (Signed)
Referral to [facility]VS for eval and further management Discussed importance of elevation and compression socks

## 2017-08-27 NOTE — Assessment & Plan Note (Signed)
Doing well Refilled prilosec

## 2017-08-27 NOTE — Progress Notes (Signed)
Patient: Cindy Trujillo Female    DOB: 04/10/1956   62 y.o.   MRN: [id number] Visit Date: 08/27/2017  Today's Provider: Lavon Paganini, MD   I, Martha Clan, CMA, am acting as scribe for Lavon Paganini, MD.  Chief Complaint  Patient presents with  . Diabetes   Subjective:    HPI      Diabetes Mellitus Type II, Follow-up:   Lab Results  Component Value Date   HGBA1C 9.4 (H) 12/18/2016    Last seen for diabetes 6 months ago.  Management since then includes administering Pneumovax. She was to FU in 1 month for A1C and foot exam. She is not having side effects.  Current symptoms include none and have been stable. Home blood sugar records: fasting range: 117-215  Episodes of hypoglycemia? no   Current Insulin Regimen: N/A Most Recent Eye Exam: November 2018 Weight trend: increasing steadily Prior visit with dietician: yes - she is a part of the [facility] [place] program through [facility]. Current diet: is not eating much veggies recently. Is hoping to get back on track. Current exercise: none  Pertinent Labs:    Component Value Date/Time   CHOL 210 (H) 01/15/2017 1026   TRIG 248 (H) 01/15/2017 1026   HDL 56 01/15/2017 1026   LDLCALC 119 (H) 01/15/2017 1026   CREATININE 0.81 01/15/2017 1026    Wt Readings from Last 3 Encounters:  08/27/17 244 lb (110.7 kg)  06/28/17 241 lb (109.3 kg)  03/08/17 241 lb (109.3 kg)    ------------------------------------------------------------------------ Pt is also concerned about varicose veins, and is requesting a referral for this.  Allergies  Allergen Reactions  . Sulfa Antibiotics Hives     Current Outpatient Medications:  .  ferrous sulfate 325 (65 FE) MG tablet, Take by mouth., Disp: , Rfl:  .  lisinopril (PRINIVIL,ZESTRIL) 5 MG tablet, Take 1 tablet (5 mg total) by mouth daily., Disp: 90 tablet, Rfl: 3 .  loratadine (CLARITIN) 10 MG tablet, Take 1 tablet (10 mg total) by mouth daily., Disp: 90 tablet, Rfl:  3 .  metFORMIN (GLUCOPHAGE) 1000 MG tablet, Take 1 tablet (1,000 mg total) by mouth 2 (two) times daily with a meal., Disp: 180 tablet, Rfl: 3 .  omeprazole (PRILOSEC) 20 MG capsule, Take 1 capsule (20 mg total) by mouth daily., Disp: 90 capsule, Rfl: 3 .  glucose blood (ACCU-CHEK GUIDE) test strip, Use as instructed, Disp: 100 each, Rfl: 3  Review of Systems  Constitutional: Negative for activity change, appetite change, chills, diaphoresis, fatigue, fever and unexpected weight change.  Eyes: Negative for visual disturbance.  Respiratory: Negative for shortness of breath.   Cardiovascular: Negative for chest pain, palpitations and leg swelling.  Endocrine: Negative for polydipsia, polyphagia and polyuria.    Social History   Tobacco Use  . Smoking status: Never Smoker  . Smokeless tobacco: Never Used  Substance Use Topics  . Alcohol use: No    Alcohol/week: 0.0 oz   Objective:   BP 130/76 (BP Location: Left Arm, Patient Position: Sitting, Cuff Size: Large)   Pulse 79   Temp 98.1 F (36.7 C) (Oral)   Resp 16   Wt 244 lb (110.7 kg)   SpO2 99%   BMI 40.60 kg/m  Vitals:   08/27/17 1455  BP: 130/76  Pulse: 79  Resp: 16  Temp: 98.1 F (36.7 C)  TempSrc: Oral  SpO2: 99%  Weight: 244 lb (110.7 kg)     Physical Exam  Constitutional:  She is oriented to person, place, and time. She appears well-developed and well-nourished. No distress.  HENT:  Head: Normocephalic and atraumatic.  Eyes: Conjunctivae are normal.  Neck: Neck supple. No thyromegaly present.  Cardiovascular: Normal rate, regular rhythm, normal heart sounds and intact distal pulses.  No murmur heard. Pulmonary/Chest: Effort normal and breath sounds normal. No respiratory distress. She has no wheezes. She has no rales.  Musculoskeletal: She exhibits edema (1+ bilaterally).  + varicose veins bilaterally  Lymphadenopathy:    She has no cervical adenopathy.  Neurological: She is alert and oriented to person,  place, and time.  Skin: Skin is warm and dry. Capillary refill takes less than 2 seconds. No rash noted.  Psychiatric: She has a normal mood and affect. Her behavior is normal.  Vitals reviewed.   Diabetic Foot Exam - Simple   Simple Foot Form Diabetic Foot exam was performed with the following findings:  Yes 08/27/2017  3:19 PM  Visual Inspection No deformities, no ulcerations, no other skin breakdown bilaterally:  Yes Sensation Testing Intact to touch and monofilament testing bilaterally:  Yes Pulse Check Posterior Tibialis and Dorsalis pulse intact bilaterally:  Yes Comments      Results for orders placed or performed during the hospital encounter of 08/23/17  Glucose, capillary  Result Value Ref Range   Glucose-Capillary 205 (H) 65 - 99 mg/dL      Assessment & Plan:   Problem List Items Addressed This Visit      Cardiovascular and Mediastinum   Varicose veins of both lower extremities    Referral to VVS for eval and further management Discussed importance of elevation and compression socks      Relevant Medications   lisinopril (PRINIVIL,ZESTRIL) 5 MG tablet   Other Relevant Orders   Ambulatory referral to Vascular Surgery     Respiratory   Allergic rhinitis    Doing well Refilled claritin        Digestive   GERD (gastroesophageal reflux disease)    Doing well Refilled prilosec      Relevant Medications   omeprazole (PRILOSEC) 20 MG capsule     Endocrine   Diabetes mellitus without complication (HCC) - Primary    Improving A1c decreasing from 9.4 to 8.8 Increase Metformin to 1037m BID Foot exam completed today UTD on eye exam, vaccines Seeing dietician Continue lisinopril for microalbuminuria F/u in 3 months      Relevant Medications   metFORMIN (GLUCOPHAGE) 1000 MG tablet   lisinopril (PRINIVIL,ZESTRIL) 5 MG tablet    Other Visit Diagnoses    Microalbuminuria due to type 2 diabetes mellitus (HCC)       Relevant Medications   metFORMIN  (GLUCOPHAGE) 1000 MG tablet   lisinopril (PRINIVIL,ZESTRIL) 5 MG tablet       Return in about 3 months (around 11/27/2017) for diabetes f/u.   The entirety of the information documented in the History of Present Illness, Review of Systems and Physical Exam were personally obtained by me. Portions of this information were initially documented by ERaquel SarnaRatchford, CMA and reviewed by me for thoroughness and accuracy.    BVirginia Crews MD, MPH [facility]5/17/2019 3:25 PM

## 2017-08-27 NOTE — Addendum Note (Signed)
Addended by: Brennan Bailey on: 08/27/2017 04:34 PM   Modules accepted: Orders

## 2017-08-27 NOTE — Patient Instructions (Signed)

## 2017-08-27 NOTE — Assessment & Plan Note (Signed)
Doing well Refilled claritin

## 2017-10-05 ENCOUNTER — Encounter: Payer: Self-pay | Admitting: Family Medicine

## 2017-10-05 ENCOUNTER — Ambulatory Visit (INDEPENDENT_AMBULATORY_CARE_PROVIDER_SITE_OTHER): Payer: Self-pay | Admitting: Family Medicine

## 2017-10-05 ENCOUNTER — Ambulatory Visit (INDEPENDENT_AMBULATORY_CARE_PROVIDER_SITE_OTHER): Payer: 59 | Admitting: Family Medicine

## 2017-10-05 VITALS — BP 118/70 | HR 88 | Temp 98.4°F | Wt 243.0 lb

## 2017-10-05 VITALS — BP 110/58 | HR 87 | Temp 98.1°F | Wt 242.8 lb

## 2017-10-05 DIAGNOSIS — R1084 Generalized abdominal pain: Secondary | ICD-10-CM

## 2017-10-05 DIAGNOSIS — R350 Frequency of micturition: Secondary | ICD-10-CM | POA: Diagnosis not present

## 2017-10-05 DIAGNOSIS — R109 Unspecified abdominal pain: Secondary | ICD-10-CM

## 2017-10-05 DIAGNOSIS — R81 Glycosuria: Secondary | ICD-10-CM

## 2017-10-05 DIAGNOSIS — E119 Type 2 diabetes mellitus without complications: Secondary | ICD-10-CM

## 2017-10-05 DIAGNOSIS — R358 Other polyuria: Secondary | ICD-10-CM

## 2017-10-05 LAB — POCT URINALYSIS DIPSTICK
Bilirubin, UA: NEGATIVE
Blood, UA: NEGATIVE
Blood, UA: NEGATIVE
GLUCOSE UA: NEGATIVE
Glucose, UA: POSITIVE — AB
KETONES UA: 5
Leukocytes, UA: NEGATIVE
Leukocytes, UA: NEGATIVE
NITRITE UA: NEGATIVE
NITRITE UA: NEGATIVE
PH UA: 6 (ref 5.0–8.0)
PROTEIN UA: POSITIVE — AB
PROTEIN UA: POSITIVE — AB
SPEC GRAV UA: 1.025 (ref 1.010–1.025)
Spec Grav, UA: >=1.03 — AB (ref 1.010–1.025)
UROBILINOGEN UA: 0.2 U/dL
Urobilinogen, UA: 0.2 E.U./dL
pH, UA: 6 (ref 5.0–8.0)

## 2017-10-05 NOTE — Progress Notes (Signed)
Patient is a type 2 diabetic (uncontrolled last A1C 8.8) and presented today with symptoms of urinary frequency and large volume of glucose in urine. Urine negative of bacteria or leukocytes. Contacted patient's primary care provider's office and was able to have patient seen today at 3:20 pm. Patient was given a work excuse advising that patient is being referred back to PCP for further evaluation.    Carroll Sage. Kenton Kingfisher, MSN, FNP-C [facility]  [place],  [phone]

## 2017-10-05 NOTE — Progress Notes (Signed)
Patient: Cindy Trujillo Female    DOB: [birth year]   62 y.o.   MRN: [id number] Visit Date: 10/05/2017  Today's Provider: Vernie Murders, PA   Chief Complaint  Patient presents with  . Abdominal Pain  . Urinary Frequency   Subjective:    HPI Patient presents today C/O abdominal pain, nausea, chills and urinary frequency. Symptoms started on Saturday. She was seen at [facility]. UA was done and showed Glucose and Protein 2+. Molli Barrows FNP from [place] called to request patient be seen by PCP for further evaluation.     Past Medical History:  Diagnosis Date  . Diabetes mellitus without complication ([place])   . GERD (gastroesophageal reflux disease)   . Hyperlipidemia    Past Surgical History:  Procedure Laterality Date  . COLONOSCOPY WITH PROPOFOL N/A 08/23/2017   Procedure: COLONOSCOPY WITH PROPOFOL;  Surgeon: Manya Silvas, MD;  Location: [facility] ENDOSCOPY;  Service: Endoscopy;  Laterality: N/A;  . TONSILLECTOMY AND ADENOIDECTOMY  1968   Family History  Problem Relation Age of Onset  . COPD Mother        heavy smoker  . Pulmonary fibrosis Mother   . Fibromyalgia Mother   . COPD Father   . Melanoma Father   . COPD Sister   . COPD Brother   . COPD Sister   . COPD Sister   . Healthy Brother   . Heart failure Maternal Grandmother   . Heart attack Maternal Grandfather   . Cancer Paternal Grandmother        unsure of type, had mets in colon  . Heart attack Paternal Grandfather   . Cancer Paternal Grandfather        unknown  . Breast cancer Neg Hx   . Cervical cancer Neg Hx    Allergies  Allergen Reactions  . Sulfa Antibiotics Hives    Current Outpatient Medications:  .  ferrous sulfate 325 (65 FE) MG tablet, Take by mouth., Disp: , Rfl:  .  glucose blood (ACCU-CHEK GUIDE) test strip, Use as instructed, Disp: 100 each, Rfl: 3 .  lisinopril (PRINIVIL,ZESTRIL) 5 MG tablet, Take 1 tablet (5 mg total) by mouth daily., Disp: 90 tablet, Rfl: 3 .  loratadine  (CLARITIN) 10 MG tablet, Take 1 tablet (10 mg total) by mouth daily., Disp: 90 tablet, Rfl: 3 .  metFORMIN (GLUCOPHAGE) 1000 MG tablet, Take 1 tablet (1,000 mg total) by mouth 2 (two) times daily with a meal., Disp: 180 tablet, Rfl: 3 .  omeprazole (PRILOSEC) 20 MG capsule, Take 1 capsule (20 mg total) by mouth daily., Disp: 90 capsule, Rfl: 3  Review of Systems  Constitutional: Positive for chills.  Respiratory: Negative.   Cardiovascular: Negative.   Gastrointestinal: Positive for abdominal pain and nausea.  Genitourinary: Positive for frequency.   Social History   Tobacco Use  . Smoking status: Never Smoker  . Smokeless tobacco: Never Used  Substance Use Topics  . Alcohol use: No    Alcohol/week: 0.0 oz   Objective:   BP (!) 110/58 (BP Location: Right Arm, Patient Position: Sitting, Cuff Size: Large)   Pulse 87   Temp 98.1 F (36.7 C) (Oral)   Wt 242 lb 12.8 oz (110.1 kg)   SpO2 99%   BMI 40.40 kg/m  Vitals:   10/05/17 1554  BP: (!) 110/58  Pulse: 87  Temp: 98.1 F (36.7 C)  TempSrc: Oral  SpO2: 99%  Weight: 242 lb 12.8 oz (110.1 kg)   Physical  Exam  Constitutional: She is oriented to person, place, and time. She appears well-developed and well-nourished. No distress.  HENT:  Head: Normocephalic and atraumatic.  Right Ear: Hearing normal.  Left Ear: Hearing normal.  Nose: Nose normal.  Eyes: Conjunctivae and lids are normal. Right eye exhibits no discharge. Left eye exhibits no discharge. No scleral icterus.  Cardiovascular: Normal rate and regular rhythm.  Pulmonary/Chest: Effort normal and breath sounds normal. No respiratory distress.  Abdominal: Soft. Bowel sounds are normal. There is tenderness.  Some soreness in the RLQ and LLQ. No masses, rebound or rigidity.  Musculoskeletal: Normal range of motion.  Neurological: She is alert and oriented to person, place, and time.  Skin: Skin is intact. No lesion and no rash noted.  Psychiatric: She has a normal  mood and affect. Her speech is normal and behavior is normal. Thought content normal.      Assessment & Plan:     1. Generalized abdominal pain Onset of lower abdominal discomfort and urinary frequency over the past 24 hours. No vomiting, hematochezia, hematemesis, hematuria or diarrhea. Slight urinary frequency. No fever. Went to a walk-in clinic and urine dipstick was unremarkable. Microscopic exam showed many epithelial cells with 1+ bacteria and occasional WBC per hpf. Will get C&S and encouraged to drink extra water. Recheck pending lab report. - POCT Urinalysis Dipstick - Urine Culture  2. Urinary frequency Feels this may have occurred because she drank some Mt. Dew over the past weekend with some sweet tea. No glucose on repeat U/A. Encouraged to flush system with extra water in diet. States FBS was 161 this morning and continued Metformin 1000 mg BID. hgb A1C was 8.8 on 08-27-17. Denies hypoglycemic episodes. Recheck pending C&S report. May need additional diagnostic tests.  - POCT Urinalysis Dipstick - Urine Culture       Vernie Murders, PA  [facility]

## 2017-10-07 LAB — URINE CULTURE

## 2017-12-06 ENCOUNTER — Ambulatory Visit (INDEPENDENT_AMBULATORY_CARE_PROVIDER_SITE_OTHER): Payer: 59 | Admitting: Family Medicine

## 2017-12-06 ENCOUNTER — Encounter: Payer: Self-pay | Admitting: Family Medicine

## 2017-12-06 VITALS — BP 118/70 | HR 87 | Temp 98.1°F | Wt 242.0 lb

## 2017-12-06 DIAGNOSIS — E119 Type 2 diabetes mellitus without complications: Secondary | ICD-10-CM | POA: Diagnosis not present

## 2017-12-06 LAB — POCT GLYCOSYLATED HEMOGLOBIN (HGB A1C): HEMOGLOBIN A1C: 7.8 % — AB (ref 4.0–5.6)

## 2017-12-06 MED ORDER — DAPAGLIFLOZIN PROPANEDIOL 5 MG PO TABS: 5.0000 mg | ORAL_TABLET | Freq: Every day | ORAL | 2 refills | Status: DC

## 2017-12-06 MED ORDER — GLUCOSE BLOOD VI STRP: ORAL_STRIP | 3 refills | Status: DC

## 2017-12-06 MED ORDER — ACCU-CHEK FASTCLIX LANCETS MISC: 3 refills | Status: DC

## 2017-12-06 NOTE — Patient Instructions (Addendum)

## 2017-12-06 NOTE — Assessment & Plan Note (Signed)
Uncontrolled, but improving A1c has decreased from 8.8 to 7.8 Patient is having side effects of diarrhea from her metformin, but she states that this is manageable and wishes to continue with 1000 mg twice daily We will start for [employer] 5 mg daily Discussed potential side effects including increased UTI and yeast infections Seeing a dietitian Part of the [place] well [facility] program for diabetes Refilled lancets and test strips Continue lisinopril for microalbuminuria Up-to-date on foot exam, eye exam, vaccinations Follow-up in 3 months

## 2017-12-06 NOTE — Progress Notes (Signed)
Patient: Cindy Trujillo Female    DOB: 06-Feb-1956   62 y.o.   MRN: [id number] Visit Date: 12/06/2017  Today's Provider: Lavon Paganini, MD   Chief Complaint  Patient presents with  . Diabetes   Subjective:    I, Tiburcio Pea, CMA, am acting as a Education administrator for Lavon Paganini, MD.   HPI  Diabetes Mellitus Type II, Follow-up:   RecentLabs   Lab Results  Component Value Date   HGBA1C 8.8 08/27/2017   Last seen for diabetes 3 months ago.  Management since then includes increase Metformin to 1000 mg BID, Continue Lisinopril for Microalbuminuria. She is not having side effects.  Current symptoms include none  Home blood sugar records: fasting range: averaging 140-170  Episodes of hypoglycemia? no              Current Insulin Regimen: N/A Most Recent Eye Exam: 03/12/2017 Weight trend: increasing steadily Prior visit with dietician: yes - she is a part of the [facility] [place] program through [facility]. Current diet: eats more vegetables  Current exercise: none  Pertinent Labs: Labs(Brief)   Lab Results  Component Value Date   CHOL 210 (H) 01/15/2017   HDL 56 01/15/2017   LDLCALC 119 (H) 01/15/2017   TRIG 248 (H) 01/15/2017   CHOLHDL 3.8 01/15/2017    Wt Readings from Last 3 Encounters:  12/06/17 242 lb (109.8 kg)  10/05/17 242 lb 12.8 oz (110.1 kg)  10/05/17 243 lb (110.2 kg)    Allergies  Allergen Reactions  . Sulfa Antibiotics Hives     Current Outpatient Medications:  .  ferrous sulfate 325 (65 FE) MG tablet, Take by mouth., Disp: , Rfl:  .  glucose blood (ACCU-CHEK GUIDE) test strip, Use as instructed, Disp: 100 each, Rfl: 3 .  lisinopril (PRINIVIL,ZESTRIL) 5 MG tablet, Take 1 tablet (5 mg total) by mouth daily., Disp: 90 tablet, Rfl: 3 .  loratadine (CLARITIN) 10 MG tablet, Take 1 tablet (10 mg total) by mouth daily., Disp: 90 tablet, Rfl: 3 .  metFORMIN (GLUCOPHAGE) 1000 MG tablet, Take 1 tablet (1,000 mg total) by mouth 2 (two) times daily with  a meal., Disp: 180 tablet, Rfl: 3 .  omeprazole (PRILOSEC) 20 MG capsule, Take 1 capsule (20 mg total) by mouth daily., Disp: 90 capsule, Rfl: 3  Review of Systems  Constitutional: Negative.   HENT: Negative.   Respiratory: Negative.   Cardiovascular: Negative.   Gastrointestinal: Negative.   Genitourinary: Negative.   Musculoskeletal: Negative.   Neurological: Negative.   Psychiatric/Behavioral: Negative.     Social History   Tobacco Use  . Smoking status: Never Smoker  . Smokeless tobacco: Never Used  Substance Use Topics  . Alcohol use: No    Alcohol/week: 0.0 standard drinks   Objective:   BP 118/70 (BP Location: Right Arm, Patient Position: Sitting, Cuff Size: Normal)   Pulse 87   Temp 98.1 F (36.7 C) (Oral)   Wt 242 lb (109.8 kg)   SpO2 97%   BMI 40.27 kg/m  Vitals:   12/06/17 1621  BP: 118/70  Pulse: 87  Temp: 98.1 F (36.7 C)  TempSrc: Oral  SpO2: 97%  Weight: 242 lb (109.8 kg)     Physical Exam  Constitutional: She is oriented to person, place, and time. She appears well-developed and well-nourished. No distress.  HENT:  Head: Normocephalic and atraumatic.  Eyes: Conjunctivae are normal. No scleral icterus.  Neck: Neck supple. No thyromegaly present.  Cardiovascular: Normal  rate, regular rhythm, normal heart sounds and intact distal pulses.  No murmur heard. Pulmonary/Chest: Effort normal and breath sounds normal. No respiratory distress. She has no wheezes. She has no rales.  Abdominal: Soft. She exhibits no distension. There is no tenderness.  Musculoskeletal: She exhibits no edema.  Lymphadenopathy:    She has no cervical adenopathy.  Neurological: She is alert and oriented to person, place, and time.  Skin: Skin is warm and dry. Capillary refill takes less than 2 seconds. No rash noted.  Psychiatric: She has a normal mood and affect. Her behavior is normal.  Vitals reviewed.    Results for orders placed or performed in visit on 12/06/17    POCT HgB A1C  Result Value Ref Range   Hemoglobin A1C 7.8 (A) 4.0 - 5.6 %   HbA1c POC (<> result, manual entry)     HbA1c, POC (prediabetic range)     HbA1c, POC (controlled diabetic range)         Assessment & Plan:   Problem List Items Addressed This Visit      Endocrine   Diabetes mellitus without complication ([place]) - Primary    Uncontrolled, but improving A1c has decreased from 8.8 to 7.8 Patient is having side effects of diarrhea from her metformin, but she states that this is manageable and wishes to continue with 1000 mg twice daily We will start for [employer] 5 mg daily Discussed potential side effects including increased UTI and yeast infections Seeing a dietitian Part of the [place] well Tamala Julian program for diabetes Refilled lancets and test strips Continue lisinopril for microalbuminuria Up-to-date on foot exam, eye exam, vaccinations Follow-up in 3 months      Relevant Medications   dapagliflozin propanediol (FARXIGA) 5 MG TABS tablet   ACCU-CHEK FASTCLIX LANCETS MISC   glucose blood (ACCU-CHEK GUIDE) test strip   Other Relevant Orders   POCT HgB A1C (Completed)       Return in about 3 months (around 03/08/2018) for diabetes f/u.   The entirety of the information documented in the History of Present Illness, Review of Systems and Physical Exam were personally obtained by me. Portions of this information were initially documented by Tiburcio Pea, CMA and reviewed by me for thoroughness and accuracy.    Virginia Crews, MD, MPH [facility] 12/06/2017 4:57 PM

## 2018-03-08 ENCOUNTER — Other Ambulatory Visit: Payer: Self-pay | Admitting: Family Medicine

## 2018-03-17 ENCOUNTER — Encounter: Payer: Self-pay | Admitting: Family Medicine

## 2018-03-17 ENCOUNTER — Ambulatory Visit: Payer: 59 | Admitting: Family Medicine

## 2018-03-17 VITALS — BP 128/81 | HR 85 | Temp 97.9°F | Wt 238.4 lb

## 2018-03-17 DIAGNOSIS — E119 Type 2 diabetes mellitus without complications: Secondary | ICD-10-CM

## 2018-03-17 DIAGNOSIS — E782 Mixed hyperlipidemia: Secondary | ICD-10-CM | POA: Diagnosis not present

## 2018-03-17 LAB — POCT GLYCOSYLATED HEMOGLOBIN (HGB A1C)
Est. average glucose Bld gHb Est-mCnc: 163
HEMOGLOBIN A1C: 7.3 % — AB (ref 4.0–5.6)

## 2018-03-17 MED ORDER — OMEPRAZOLE 20 MG PO CPDR: 20.0000 mg | DELAYED_RELEASE_CAPSULE | Freq: Every day | ORAL | 3 refills | Status: DC

## 2018-03-17 MED ORDER — GLUCOSE BLOOD VI STRP: ORAL_STRIP | 3 refills | Status: DC

## 2018-03-17 MED ORDER — METFORMIN HCL 1000 MG PO TABS: 1000.0000 mg | ORAL_TABLET | Freq: Two times a day (BID) | ORAL | 3 refills | Status: DC

## 2018-03-17 MED ORDER — DAPAGLIFLOZIN PROPANEDIOL 10 MG PO TABS: 10.0000 mg | ORAL_TABLET | Freq: Every day | ORAL | 3 refills | Status: DC

## 2018-03-17 NOTE — Patient Instructions (Addendum)
   The CDC recommends two doses of Shingrix (the shingles vaccine) separated by 2 to 6 months for adults age 60 years and older. I recommend checking with your insurance plan regarding coverage for this vaccine.    Schedule an eye exam   Come back fasting for labs one morning

## 2018-03-17 NOTE — Progress Notes (Signed)
Patient: Cindy Trujillo Female    DOB: October 09, 1955   62 y.o.   MRN: [id number] Visit Date: 03/18/2018  Today's Provider: Lavon Paganini, MD   Chief Complaint  Patient presents with  . Diabetes   Subjective:    HPI  Diabetes Mellitus Type II, Follow-up:   Lab Results  Component Value Date   HGBA1C 7.3 (A) 03/17/2018   HGBA1C 7.8 (A) 12/06/2017   HGBA1C 8.8 08/27/2017    Last seen for diabetes 3 months ago.  Management since then includes add Farxiga 5 mg tablets and patient states that the medication is helping. She reports good compliance with treatment. She is not having side effects.  Current symptoms include none and have been stable. Home blood sugar records: arranges from 110-170.  Episodes of hypoglycemia? no   Current Insulin Regimen: none Most Recent Eye Exam: 03/2017 Weight trend: decreasing steadily Prior visit with dietician: no Current diet: well balanced Current exercise: walking  Pertinent Labs:    Component Value Date/Time   CHOL 210 (H) 01/15/2017 1026   TRIG 248 (H) 01/15/2017 1026   HDL 56 01/15/2017 1026   LDLCALC 119 (H) 01/15/2017 1026   CREATININE 0.81 01/15/2017 1026    Wt Readings from Last 3 Encounters:  03/17/18 238 lb 6.4 oz (108.1 kg)  12/06/17 242 lb (109.8 kg)  10/05/17 242 lb 12.8 oz (110.1 kg)    ------------------------------------------------------------------------     Allergies  Allergen Reactions  . Sulfa Antibiotics Hives     Current Outpatient Medications:  .  ACCU-CHEK FASTCLIX LANCETS MISC, Use as directed to check blood sugar 1-2 times daily, Disp: 100 each, Rfl: 3 .  ferrous sulfate 325 (65 FE) MG tablet, Take by mouth., Disp: , Rfl:  .  glucose blood (ACCU-CHEK GUIDE) test strip, Use as instructed, Disp: 100 each, Rfl: 3 .  lisinopril (PRINIVIL,ZESTRIL) 5 MG tablet, Take 1 tablet (5 mg total) by mouth daily., Disp: 90 tablet, Rfl: 3 .  loratadine (CLARITIN) 10 MG tablet, Take 1 tablet (10 mg  total) by mouth daily., Disp: 90 tablet, Rfl: 3 .  metFORMIN (GLUCOPHAGE) 1000 MG tablet, Take 1 tablet (1,000 mg total) by mouth 2 (two) times daily with a meal., Disp: 180 tablet, Rfl: 3 .  omeprazole (PRILOSEC) 20 MG capsule, Take 1 capsule (20 mg total) by mouth daily., Disp: 90 capsule, Rfl: 3 .  dapagliflozin propanediol (FARXIGA) 10 MG TABS tablet, Take 10 mg by mouth daily., Disp: 90 tablet, Rfl: 3  Review of Systems  Constitutional: Negative.   HENT: Negative.   Respiratory: Negative.   Cardiovascular: Negative.   Endocrine: Negative.   Genitourinary: Negative.   Neurological: Negative.     Social History   Tobacco Use  . Smoking status: Never Smoker  . Smokeless tobacco: Never Used  Substance Use Topics  . Alcohol use: No    Alcohol/week: 0.0 standard drinks   Objective:   BP 128/81 (BP Location: Left Arm, Patient Position: Sitting, Cuff Size: Normal)   Pulse 85   Temp 97.9 F (36.6 C) (Oral)   Wt 238 lb 6.4 oz (108.1 kg)   SpO2 99%   BMI 39.67 kg/m  Vitals:   03/17/18 1556  BP: 128/81  Pulse: 85  Temp: 97.9 F (36.6 C)  TempSrc: Oral  SpO2: 99%  Weight: 238 lb 6.4 oz (108.1 kg)     Physical Exam  Constitutional: She is oriented to person, place, and time. She appears well-developed and well-nourished. No distress.  HENT:  Head: Normocephalic and atraumatic.  Mouth/Throat: Oropharynx is clear and moist.  Eyes: Conjunctivae are normal. No scleral icterus.  Neck: Neck supple. No thyromegaly present.  Cardiovascular: Normal rate, regular rhythm, normal heart sounds and intact distal pulses.  No murmur heard. Pulmonary/Chest: Effort normal and breath sounds normal. No respiratory distress. She has no wheezes. She has no rales.  Abdominal: Soft. She exhibits no distension. There is no tenderness.  Musculoskeletal: She exhibits no edema.  Lymphadenopathy:    She has no cervical adenopathy.  Neurological: She is alert and oriented to person, place, and  time.  Skin: Skin is warm and dry. Capillary refill takes less than 2 seconds. No rash noted.  Psychiatric: She has a normal mood and affect. Her behavior is normal.  Vitals reviewed.   Results for orders placed or performed in visit on 03/17/18  POCT glycosylated hemoglobin (Hb A1C)  Result Value Ref Range   Hemoglobin A1C 7.3 (A) 4.0 - 5.6 %   HbA1c POC (<> result, manual entry)     HbA1c, POC (prediabetic range)     HbA1c, POC (controlled diabetic range)     Est. average glucose Bld gHb Est-mCnc 163        Assessment & Plan:   Problem List Items Addressed This Visit      Endocrine   Diabetes mellitus without complication ([place]) - Primary    Uncontrolled but improving A1c decreasing steadily Patient is having SEs of diarrhea from metformin, but this is manageable and wishes to continue with 1065m BID Increase farxiga to 171mdaily On Lisinopril Seeing dietician UTD on vaccines and foot exams To schedule next eye exam F/u in 3 months      Relevant Medications   dapagliflozin propanediol (FARXIGA) 10 MG TABS tablet   glucose blood (ACCU-CHEK GUIDE) test strip   metFORMIN (GLUCOPHAGE) 1000 MG tablet   Other Relevant Orders   POCT glycosylated hemoglobin (Hb A1C) (Completed)   Lipid panel   Comprehensive metabolic panel     Other   Hyperlipidemia    Not currently on a statin Recheck lipid panel and CMP      Relevant Orders   Lipid panel   Comprehensive metabolic panel       Return in about 3 months (around 06/16/2018) for diabetes f/u.   The entirety of the information documented in the History of Present Illness, Review of Systems and Physical Exam were personally obtained by me. Portions of this information were initially documented by NiTiburcio Peand PoHurman HornCMA and reviewed by me for thoroughness and accuracy.    BaVirginia CrewsMD, MPH [facility]2/09/2017 1:11 PM

## 2018-03-18 NOTE — Assessment & Plan Note (Signed)
Uncontrolled but improving A1c decreasing steadily Patient is having SEs of diarrhea from metformin, but this is manageable and wishes to continue with 1048m BID Increase farxiga to 1107mdaily On Lisinopril Seeing dietician UTD on vaccines and foot exams To schedule next eye exam F/u in 3 months

## 2018-03-18 NOTE — Assessment & Plan Note (Signed)
Not currently on a statin Recheck lipid panel and CMP

## 2018-03-30 ENCOUNTER — Ambulatory Visit (INDEPENDENT_AMBULATORY_CARE_PROVIDER_SITE_OTHER): Payer: Self-pay | Admitting: Physician Assistant

## 2018-03-30 ENCOUNTER — Ambulatory Visit (INDEPENDENT_AMBULATORY_CARE_PROVIDER_SITE_OTHER): Payer: 59 | Admitting: Family Medicine

## 2018-03-30 ENCOUNTER — Encounter: Payer: Self-pay | Admitting: Family Medicine

## 2018-03-30 ENCOUNTER — Encounter: Payer: Self-pay | Admitting: Physician Assistant

## 2018-03-30 VITALS — BP 144/84 | HR 88 | Temp 98.1°F | Resp 20 | Ht 63.0 in | Wt 237.0 lb

## 2018-03-30 VITALS — BP 118/79 | HR 84 | Temp 97.9°F | Wt 238.2 lb

## 2018-03-30 DIAGNOSIS — E119 Type 2 diabetes mellitus without complications: Secondary | ICD-10-CM

## 2018-03-30 DIAGNOSIS — R109 Unspecified abdominal pain: Secondary | ICD-10-CM

## 2018-03-30 DIAGNOSIS — R300 Dysuria: Secondary | ICD-10-CM

## 2018-03-30 DIAGNOSIS — R3915 Urgency of urination: Secondary | ICD-10-CM

## 2018-03-30 DIAGNOSIS — E782 Mixed hyperlipidemia: Secondary | ICD-10-CM

## 2018-03-30 DIAGNOSIS — R1011 Right upper quadrant pain: Secondary | ICD-10-CM | POA: Diagnosis not present

## 2018-03-30 LAB — POCT URINALYSIS DIPSTICK
BILIRUBIN UA: NEGATIVE
Blood, UA: NEGATIVE
Glucose, UA: POSITIVE — AB
KETONES UA: NEGATIVE
Leukocytes, UA: NEGATIVE
NITRITE UA: NEGATIVE
PROTEIN UA: NEGATIVE
Spec Grav, UA: 1.015 (ref 1.010–1.025)
Urobilinogen, UA: 0.2 E.U./dL
pH, UA: 5.5 (ref 5.0–8.0)

## 2018-03-30 MED ORDER — TRAMADOL HCL 50 MG PO TABS: 50.0000 mg | ORAL_TABLET | Freq: Three times a day (TID) | ORAL | 0 refills | Status: AC | PRN

## 2018-03-30 NOTE — Patient Instructions (Signed)

## 2018-03-30 NOTE — Patient Instructions (Signed)
Thank you for choosing [facility] for your health care needs.  You have been diagnosed with right sided abdominal pain.  You need further evaluation than [facility] can provide.  Your family physician, Dr. Brita Romp MD, will see you today at 3:45pm.  Hope you feel better soon.  Abdominal Pain, Adult  Many things can cause belly (abdominal) pain. Most times, belly pain is not dangerous. Many cases of belly pain can be watched and treated at home. Sometimes belly pain is serious, though. Your doctor will try to find the cause of your belly pain. Follow these instructions at home:  Take over-the-counter and prescription medicines only as told by your doctor. Do not take medicines that help you poop (laxatives) unless told to by your doctor.  Drink enough fluid to keep your pee (urine) clear or pale yellow.  Watch your belly pain for any changes.  Keep all follow-up visits as told by your doctor. This is important. Contact a doctor if:  Your belly pain changes or gets worse.  You are not hungry, or you lose weight without trying.  You are having trouble pooping (constipated) or have watery poop (diarrhea) for more than 2-3 days.  You have pain when you pee or poop.  Your belly pain wakes you up at night.  Your pain gets worse with meals, after eating, or with certain foods.  You are throwing up and cannot keep anything down.  You have a fever. Get help right away if:  Your pain does not go away as soon as your doctor says it should.  You cannot stop throwing up.  Your pain is only in areas of your belly, such as the right side or the left lower part of the belly.  You have bloody or black poop, or poop that looks like tar.  You have very bad pain, cramping, or bloating in your belly.  You have signs of not having enough fluid or water in your body (dehydration), such as: ? Dark pee, very little pee, or no pee. ? Cracked lips. ? Dry mouth. ? Sunken  eyes. ? Sleepiness. ? Weakness. This information is not intended to replace advice given to you by your health care provider. Make sure you discuss any questions you have with your health care provider. Document Released: 09/16/2007 Document Revised: 10/18/2015 Document Reviewed: 09/11/2015 Elsevier Interactive Patient Education  2019 [employer].

## 2018-03-30 NOTE — Progress Notes (Signed)
[name] DOB: Aug 11, 1955 AGE: 62 y.o. MRN: [id number]   PCP: Virginia Crews, MD   Chief Complaint:  Chief Complaint  Patient presents with  . Urinary Tract Infection    x 2d     Subjective:    HPI:  Cindy Trujillo is a 62 y.o. female presents for evaluation  Chief Complaint  Patient presents with  . Urinary Tract Infection    x 62d    62 year old female presents with four day history of abdominal/pelvic symptoms. Began Sunday 03/27/2018 with vaginal discharge. Scant amount on underwear; bloody discoloration. Following day had scant vaginal discharge again, this time yellow. No associated odor. No vaginal rash or pruritis. Patient is post-menopausal. Denies concern for STD (monogamous relationship with husband). Patient yesterday, 03/29/2018, developed abdominal/side pain. Located mid right abdomen with extension to mid axillary line/flank. Describes as constant ache/soreness. No colicky quality. Severe. 8/10 in severity. Worse with lying flat. Worse with pressure/palpation. Had to lie on left side when sleeping last night (only able to sleep total of 3 hours due to severity of pain). States she has never had this severe of abdominal pain previously. No known injury/trauma. Associated headache. Denies fever, chills, URI symptoms (does report mild seasonal allergies), nausea/vomiting, diarrhea/constipation, abdominal bloating/distension, or rash. Patient suspected she had a UTI; reports increased urinary urgency and increased urinary frequency. Denies dysuria, foul odor to urine, nocturia, hematuria. Patient with no previous history of kidney stone or kidney infection. Patient with no previous history of gallbladder disease. Patient still has gallbladder and appendix. Patient underwent colonoscopy on 08/23/2017; states no abnormalities.  Patient regularly followed by [facility]. Last seen 03/17/2018. Controlled on Metformin and Wilder Glade (started 3-4  months ago, increases risk of vaginal yeast infections and UTIs). Last A1C 7.3.  Patient seen at [facility]; 10/05/2017, for urinary frequency. UA revealed large amount of glucose. Patient referred to PCP; seen that day. Patient evaluated for lower abdominal pain and urinary frequency. No glucose on repeat UA. Advised to increase fluids. Urine culture revealed mixed bacteria. No antibiotic prescribed.  A limited review of symptoms was performed, pertinent positives and negatives as mentioned in HPI.  The following portions of the patient's history were reviewed and updated as appropriate: allergies, current medications and past medical history.  Patient Active Problem List   Diagnosis Date Noted  . Varicose veins of both lower extremities 08/27/2017  . Diabetes mellitus without complication ([place])   . Hyperlipidemia   . Allergic rhinitis 12/11/2016  . GERD (gastroesophageal reflux disease) 12/11/2016  . Obesity 12/11/2016    Allergies  Allergen Reactions  . Sulfa Antibiotics Hives    Current Outpatient Medications on File Prior to Visit  Medication Sig Dispense Refill  . ACCU-CHEK FASTCLIX LANCETS MISC Use as directed to check blood sugar 1-2 times daily 100 each 3  . dapagliflozin propanediol (FARXIGA) 10 MG TABS tablet Take 10 mg by mouth daily. 90 tablet 3  . ferrous sulfate 325 (65 FE) MG tablet Take by mouth.    Marland Kitchen glucose blood (ACCU-CHEK GUIDE) test strip Use as instructed 100 each 3  . lisinopril (PRINIVIL,ZESTRIL) 5 MG tablet Take 1 tablet (5 mg total) by mouth daily. 90 tablet 3  . loratadine (CLARITIN) 10 MG tablet Take 1 tablet (10 mg total) by mouth daily. 90 tablet 3  . metFORMIN (GLUCOPHAGE) 1000 MG tablet Take 1 tablet (1,000 mg total) by mouth 2 (two) times daily with a meal. 180 tablet 3  .  omeprazole (PRILOSEC) 20 MG capsule Take 1 capsule (20 mg total) by mouth daily. 90 capsule 3   No current facility-administered medications on file prior to  visit.        Objective:   Vitals:   03/30/18 1408  BP: (!) 144/84  Pulse: 88  Resp: 20  Temp: 98.1 F (36.7 C)  SpO2: 98%     Wt Readings from Last 3 Encounters:  03/30/18 237 lb (107.5 kg)  03/17/18 238 lb 6.4 oz (108.1 kg)  12/06/17 242 lb (109.8 kg)    Physical Exam:   General Appearance:  Patient standing in examination room for majority of HPI due to discomfort with sitting or lying flat. Not pacing room. Conversational. Kermit Balo self-historian. In no acute distress. Afebrile. Patient is uncomfortable but does not look ill/sick.   Head:  Normocephalic, without obvious abnormality, atraumatic  Lungs:   Clear to auscultation bilaterally, respirations unlabored. No wheezing, rales, rhonchi, crackles.  Heart:  Regular rate and rhythm, S1 and S2 normal, no murmur, rub, or gallop  Abdomen:   Abdomen soft. No visible rash. No ecchymosis. No distension. Moderate tenderness with palpation over mid right abdomen with extension to mid axillary line, just underneath ribs. Grimacing with palpation. However, no rigidity, rebound tenderness, or guarding. Minimal right CVA tenderness with percussion.   Extremities: Extremities normal, atraumatic, no cyanosis or edema  Pulses: 2+ and symmetric  Skin: Skin color, texture, turgor normal, no rashes or lesions  Lymph nodes: Cervical, supraclavicular, and axillary nodes normal  Neurologic: Normal    Assessment & Plan:    Exam findings, diagnosis etiology and medication use and indications reviewed with patient. Follow-Up and discharge instructions provided. No emergent/urgent issues found on exam.  Patient education was provided.   Patient verbalized understanding of information provided and agrees with plan of care (POC), all questions answered. The patient is advised to call or return to clinic if condition does not see an improvement in symptoms, or to seek the care of the closest emergency department if condition worsens with the below  plan.    1. Abdominal pain, unspecified abdominal location  2. Dysuria  - POCT Urinalysis Dipstick. 2067m/dL glucose. Otherwise normal.  Patient with 2 day history of severe right sided abdominal pain. UA reveals glycosuria (known type-2 diabetic on Farxiga). No leukocytes or nitrites or blood indicative of UTI/pyelonephritis or ureteral stone. Due to severity of pain, advised patient go to the ED for further evaluation and treatment. Suspect patient should have blood work and imaging; differential includes cholecystitis, appendicitis, colitis, etc.   [facility] Discussed patient's case. Patient with severe abdominal pain; however, afebrile, non-toxic appearing, and abdominal exam without red flags (guarding, rigidity, or rebound tenderness). Dr. B agreed to see patient at 4pm today for further evaluation.    SDarlin Priestly MHS, PA-C SMontey Hora MHS, PA-C Advanced Practice Provider [facility] [address] [facility] [place] [address](p):  [email or web address]

## 2018-03-30 NOTE — Progress Notes (Signed)
Patient: Cindy Trujillo Female    DOB: [birth year]   62 y.o.   MRN: [id number] Visit Date: 03/30/2018  Today's Provider: Lavon Paganini, MD   Chief Complaint  Patient presents with  . Abdominal Pain   Subjective:    I, Tiburcio Pea, CMA, am acting as a scribe for Lavon Paganini, MD.   Abdominal Pain  This is a new problem. Episode onset: Sunday. The onset quality is sudden. The problem occurs constantly. The problem has been unchanged. The pain is located in the RUQ. The quality of the pain is aching. The abdominal pain does not radiate. Associated symptoms include diarrhea. Pertinent negatives include no constipation, nausea or vomiting. Exacerbated by: certain positions. The pain is relieved by nothing. She has tried nothing for the symptoms. Her past medical history is significant for GERD.   Constant pain x3 days.  Seemed to come out of nowhere. Worse with laying on it and sitting for long periods of time. Hasn't tried any medications.  Thought it might be a UTI as Wilder Glade was recently increased.  UA negative at [facility].  No back pain, chest pain, vomiting, constipation.  Has loose stools due to Metformin.     Allergies  Allergen Reactions  . Sulfa Antibiotics Hives     Current Outpatient Medications:  .  ACCU-CHEK FASTCLIX LANCETS MISC, Use as directed to check blood sugar 1-2 times daily, Disp: 100 each, Rfl: 3 .  dapagliflozin propanediol (FARXIGA) 10 MG TABS tablet, Take 10 mg by mouth daily., Disp: 90 tablet, Rfl: 3 .  ferrous sulfate 325 (65 FE) MG tablet, Take by mouth., Disp: , Rfl:  .  glucose blood (ACCU-CHEK GUIDE) test strip, Use as instructed, Disp: 100 each, Rfl: 3 .  lisinopril (PRINIVIL,ZESTRIL) 5 MG tablet, Take 1 tablet (5 mg total) by mouth daily., Disp: 90 tablet, Rfl: 3 .  loratadine (CLARITIN) 10 MG tablet, Take 1 tablet (10 mg total) by mouth daily., Disp: 90 tablet, Rfl: 3 .  metFORMIN (GLUCOPHAGE) 1000 MG tablet, Take 1 tablet (1,000 mg  total) by mouth 2 (two) times daily with a meal., Disp: 180 tablet, Rfl: 3 .  omeprazole (PRILOSEC) 20 MG capsule, Take 1 capsule (20 mg total) by mouth daily., Disp: 90 capsule, Rfl: 3  Review of Systems  Constitutional: Negative.   Respiratory: Negative.   Cardiovascular: Negative.   Gastrointestinal: Positive for abdominal pain and diarrhea. Negative for abdominal distention, anal bleeding, blood in stool, constipation, nausea, rectal pain and vomiting.  Genitourinary: Negative.   Musculoskeletal: Negative.   Skin: Negative.   Neurological: Negative.   Psychiatric/Behavioral: Negative.     Social History   Tobacco Use  . Smoking status: Never Smoker  . Smokeless tobacco: Never Used  Substance Use Topics  . Alcohol use: No    Alcohol/week: 0.0 standard drinks      Objective:   BP 118/79 (BP Location: Right Arm, Patient Position: Sitting, Cuff Size: Large)   Pulse 84   Temp 97.9 F (36.6 C) (Oral)   Wt 238 lb 3.2 oz (108 kg)   SpO2 97%   BMI 42.20 kg/m  Vitals:   03/30/18 1602  BP: 118/79  Pulse: 84  Temp: 97.9 F (36.6 C)  TempSrc: Oral  SpO2: 97%  Weight: 238 lb 3.2 oz (108 kg)     Physical Exam Vitals signs reviewed.  Constitutional:      General: She is not in acute distress.    Appearance: She is well-developed. She  is not ill-appearing.  HENT:     Head: Normocephalic and atraumatic.     Mouth/Throat:     Mouth: Mucous membranes are moist.     Pharynx: No oropharyngeal exudate.  Eyes:     General: No scleral icterus.    Pupils: Pupils are equal, round, and reactive to light.  Cardiovascular:     Rate and Rhythm: Normal rate and regular rhythm.     Heart sounds: Normal heart sounds. No murmur.  Pulmonary:     Effort: Pulmonary effort is normal. No respiratory distress.     Breath sounds: Normal breath sounds. No wheezing or rhonchi.  Abdominal:     General: Bowel sounds are normal. There is no distension.     Palpations: Abdomen is soft. There  is no fluid wave or hepatomegaly.     Tenderness: There is abdominal tenderness in the right upper quadrant. There is no right CVA tenderness, left CVA tenderness, guarding or rebound. Positive signs include Murphy's sign. Negative signs include McBurney's sign.  Skin:    General: Skin is warm and dry.     Capillary Refill: Capillary refill takes less than 2 seconds.     Findings: No rash.  Neurological:     General: No focal deficit present.     Mental Status: She is alert and oriented to person, place, and time.     Cranial Nerves: No cranial nerve deficit.  Psychiatric:        Mood and Affect: Mood normal.        Behavior: Behavior normal.         Assessment & Plan   1. RUQ pain - new problem -Right upper quadrant pain with positive Murphy sign on exam, concerning for possible gallbladder etiology -Check CBC, CMP -Check lipase to ensure no pancreatitis, low exam is not consistent with this - Check right upper quadrant ultrasound to ensure no cholecystitis or obstructive gallstone - Discussed symptomatic management -Can use tramadol sparingly as needed for pain -Further management pending results - US Abdomen Limited RUQ; Future - CBC w/Diff/Platelet - Comprehensive metabolic panel - Lipase  2. Diabetes mellitus without complication ([place]) - no changes, checking labs from last visit - Lipid panel  3. Mixed hyperlipidemia - Lipid panel    Meds ordered this encounter  Medications  . traMADol (ULTRAM) 50 MG tablet    Sig: Take 1 tablet (50 mg total) by mouth every 8 (eight) hours as needed for up to 5 days.    Dispense:  15 tablet    Refill:  0     Return if symptoms worsen or fail to improve.   The entirety of the information documented in the History of Present Illness, Review of Systems and Physical Exam were personally obtained by me. Portions of this information were initially documented by Tiburcio Pea, CMA and reviewed by me for thoroughness and accuracy.      Virginia Crews, MD, MPH [facility] 03/31/2018 1:58 PM

## 2018-03-31 ENCOUNTER — Telehealth: Payer: Self-pay

## 2018-03-31 ENCOUNTER — Ambulatory Visit
Admission: RE | Admit: 2018-03-31 | Discharge: 2018-03-31 | Disposition: A | Discharge status: Home / Self Care | Payer: 59 | Source: Ambulatory Visit | Attending: Family Medicine | Admitting: Family Medicine

## 2018-03-31 DIAGNOSIS — R1011 Right upper quadrant pain: Secondary | ICD-10-CM | POA: Diagnosis not present

## 2018-03-31 DIAGNOSIS — K7689 Other specified diseases of liver: Secondary | ICD-10-CM | POA: Diagnosis not present

## 2018-03-31 DIAGNOSIS — K760 Fatty (change of) liver, not elsewhere classified: Secondary | ICD-10-CM | POA: Diagnosis not present

## 2018-03-31 LAB — CBC WITH DIFFERENTIAL/PLATELET
BASOS ABS: 0 10*3/uL (ref 0.0–0.2)
Basos: 1 %
EOS (ABSOLUTE): 0.2 10*3/uL (ref 0.0–0.4)
Eos: 3 %
HEMOGLOBIN: 11.4 g/dL (ref 11.1–15.9)
Hematocrit: 33.6 % — ABNORMAL LOW (ref 34.0–46.6)
IMMATURE GRANS (ABS): 0 10*3/uL (ref 0.0–0.1)
Immature Granulocytes: 0 %
LYMPHS ABS: 2 10*3/uL (ref 0.7–3.1)
LYMPHS: 36 %
MCH: 25.8 pg — ABNORMAL LOW (ref 26.6–33.0)
MCHC: 33.9 g/dL (ref 31.5–35.7)
MCV: 76 fL — ABNORMAL LOW (ref 79–97)
MONOCYTES: 7 %
Monocytes Absolute: 0.4 10*3/uL (ref 0.1–0.9)
NEUTROS PCT: 53 %
Neutrophils Absolute: 3 10*3/uL (ref 1.4–7.0)
Platelets: 127 10*3/uL — ABNORMAL LOW (ref 150–450)
RBC: 4.42 x10E6/uL (ref 3.77–5.28)
RDW: 15.6 % — ABNORMAL HIGH (ref 12.3–15.4)
WBC: 5.6 10*3/uL (ref 3.4–10.8)

## 2018-03-31 LAB — LIPID PANEL
CHOLESTEROL TOTAL: 195 mg/dL (ref 100–199)
Chol/HDL Ratio: 4.5 ratio — ABNORMAL HIGH (ref 0.0–4.4)
HDL: 43 mg/dL (ref >39)
LDL Calculated: 87 mg/dL (ref 0–99)
Triglycerides: 327 mg/dL — ABNORMAL HIGH (ref 0–149)
VLDL CHOLESTEROL CAL: 65 mg/dL — AB (ref 5–40)

## 2018-03-31 LAB — COMPREHENSIVE METABOLIC PANEL
ALBUMIN: 4.2 g/dL (ref 3.6–4.8)
ALK PHOS: 99 IU/L (ref 39–117)
ALT: 32 IU/L (ref 0–32)
AST: 42 IU/L — ABNORMAL HIGH (ref 0–40)
Albumin/Globulin Ratio: 1.5 (ref 1.2–2.2)
BUN / CREAT RATIO: 18 (ref 12–28)
BUN: 16 mg/dL (ref 8–27)
Bilirubin Total: 0.7 mg/dL (ref 0.0–1.2)
CALCIUM: 9.5 mg/dL (ref 8.7–10.3)
CO2: 19 mmol/L — AB (ref 20–29)
CREATININE: 0.91 mg/dL (ref 0.57–1.00)
Chloride: 105 mmol/L (ref 96–106)
GFR calc Af Amer: 79 mL/min/{1.73_m2} (ref >59)
GFR calc non Af Amer: 69 mL/min/{1.73_m2} (ref >59)
GLUCOSE: 100 mg/dL — AB (ref 65–99)
Globulin, Total: 2.8 g/dL (ref 1.5–4.5)
Potassium: 3.9 mmol/L (ref 3.5–5.2)
Sodium: 139 mmol/L (ref 134–144)
Total Protein: 7 g/dL (ref 6.0–8.5)

## 2018-03-31 LAB — LIPASE: LIPASE: 35 U/L (ref 14–72)

## 2018-03-31 NOTE — Telephone Encounter (Signed)
Pt returning call, please call back @ [phone].

## 2018-03-31 NOTE — Telephone Encounter (Signed)
Sounds good

## 2018-03-31 NOTE — Telephone Encounter (Signed)
LMTCB

## 2018-03-31 NOTE — Telephone Encounter (Signed)
-----   Message from Virginia Crews, MD sent at 03/31/2018 10:08 AM EST ----- Gallbladder looks fine.  Does have some fatty liver changes.  No explanation for abd pain.  Would stop Metformin for 1 wk and see if this helps.  Also watch for rash as shingles can sometimes cause pain prior to rash presenting.

## 2018-03-31 NOTE — Telephone Encounter (Signed)
-----   Message from Virginia Crews, MD sent at 03/31/2018 10:07 AM EST ----- Blood counts have dropped a little.  We will recheck in 1 month.  Normal kidney function, liver function, electrolytes, pancreas.  Cholesterol is not quite to goal, want LDL <70.  Given diabetes, recommend statin for prevention of heart disease.  Would start lipitor 77m daily if patient is agreeable.

## 2018-03-31 NOTE — Telephone Encounter (Signed)
Patient was advised and states she would like to hold off on the Lipitor for now. Patient also states she will come in next month to have her lab redrawn.

## 2018-04-11 ENCOUNTER — Encounter: Payer: Self-pay | Admitting: Physician Assistant

## 2018-04-11 ENCOUNTER — Ambulatory Visit: Payer: Self-pay | Admitting: Physician Assistant

## 2018-04-11 VITALS — BP 130/70 | HR 101 | Temp 98.7°F | Wt 240.0 lb

## 2018-04-11 DIAGNOSIS — J069 Acute upper respiratory infection, unspecified: Secondary | ICD-10-CM

## 2018-04-11 DIAGNOSIS — R05 Cough: Secondary | ICD-10-CM

## 2018-04-11 DIAGNOSIS — R059 Cough, unspecified: Secondary | ICD-10-CM

## 2018-04-11 MED ORDER — PREDNISONE 50 MG PO TABS: 50.0000 mg | ORAL_TABLET | Freq: Every day | ORAL | 0 refills | Status: AC

## 2018-04-11 MED ORDER — PSEUDOEPH-BROMPHEN-DM 30-2-10 MG/5ML PO SYRP: 5.0000 mL | ORAL_SOLUTION | Freq: Four times a day (QID) | ORAL | 0 refills | Status: DC | PRN

## 2018-04-11 NOTE — Progress Notes (Signed)
[name] DOB: June 06, 1955 AGE: 62 y.o. MRN: [id number]   PCP: Virginia Crews, MD   Chief Complaint:  Chief Complaint  Patient presents with  . sinus pressure, cough and drainage w/green mucous    x4 days     Subjective:    HPI:  Cindy Trujillo is a 62 y.o. female presents for evaluation  Chief Complaint  Patient presents with  . sinus pressure, cough and drainage w/green mucous    x39 days   62 year old female presents to [facility] with 5 day history of URI symptoms. Began with sore throat. Then developed nasal congestion, bilateral maxillary sinus pressure/congestion with associated burning pain/discomfort (equal bilaterally), and cough. Cough coarse/junky. Worse at night. Causing difficulty sleeping. Associated headache and bilateral ear fullness/pressure. Has been taking OTC Mucinex with minimal symptom improvement. Patient takes Claritin daily for allergic rhinitis. Patient hates nasal sprays. Denies fever, chills, ear discharge/drainage, tinnitus, current sore throat, chest pain, SOB, wheezing. Patient with no smoking history. No asthma diagnosis. Patient is a type 2 diabetic. Recent improved control of A1C with starting Farxiga; last A1C 7.3 on 03/17/2018.  Of note, patient seen two weeks ago, on 03/30/2018, with RUQ abdominal pain. Referred to PCP. Seen by PCP same day. Underwent RUQ ultrasound and blood work. Workup benign. Unclear etiology of pain. Patient states self resolved within one week.  A limited review of symptoms was performed, pertinent positives and negatives as mentioned in HPI.  The following portions of the patient's history were reviewed and updated as appropriate: allergies, current medications and past medical history.  Patient Active Problem List   Diagnosis Date Noted  . Varicose veins of both lower extremities 08/27/2017  . Diabetes mellitus without complication ([place])   . Hyperlipidemia   . Allergic rhinitis  12/11/2016  . GERD (gastroesophageal reflux disease) 12/11/2016  . Obesity 12/11/2016    Allergies  Allergen Reactions  . Sulfa Antibiotics Hives    Current Outpatient Medications on File Prior to Visit  Medication Sig Dispense Refill  . ACCU-CHEK FASTCLIX LANCETS MISC Use as directed to check blood sugar 1-2 times daily 100 each 3  . dapagliflozin propanediol (FARXIGA) 10 MG TABS tablet Take 10 mg by mouth daily. 90 tablet 3  . ferrous sulfate 325 (65 FE) MG tablet Take by mouth.    Marland Kitchen glucose blood (ACCU-CHEK GUIDE) test strip Use as instructed 100 each 3  . lisinopril (PRINIVIL,ZESTRIL) 5 MG tablet Take 1 tablet (5 mg total) by mouth daily. 90 tablet 3  . loratadine (CLARITIN) 10 MG tablet Take 1 tablet (10 mg total) by mouth daily. 90 tablet 3  . metFORMIN (GLUCOPHAGE) 1000 MG tablet Take 1 tablet (1,000 mg total) by mouth 2 (two) times daily with a meal. 180 tablet 3  . omeprazole (PRILOSEC) 20 MG capsule Take 1 capsule (20 mg total) by mouth daily. 90 capsule 3   No current facility-administered medications on file prior to visit.        Objective:   Vitals:   04/11/18 1128  BP: 130/70  Pulse: (!) 101  Temp: 98.7 F (37.1 C)  SpO2: 97%     Wt Readings from Last 3 Encounters:  04/11/18 240 lb (108.9 kg)  03/30/18 238 lb 3.2 oz (108 kg)  03/30/18 237 lb (107.5 kg)    Physical Exam:   General Appearance:  Patient sitting comfortably on examination table. Conversational. Kermit Balo self-historian. In no acute distress. Afebrile.   Head:  Normocephalic, without  obvious abnormality, atraumatic  Eyes:  PERRL, conjunctiva/corneas clear, EOM's intact  Ears:  Bilateral ear canals WNL. No erythema or edema. No discharge/drainage. Bilateral TMs WNL. No erythema, injection, or serous effusion. No scar tissue.  Nose: Nares normal, septum midline. Nasal mucosa reveals bilateral edema with thick clear rhinorrhea. Nasally sounding voice. No sinus tenderness with percussion/palpation.  Subjective bilateral maxillary sinus pressure/congestion.  Throat: Lips, mucosa, and tongue normal; teeth and gums normal. Throat reveals no erythema. Tonsils with no enlargement or exudate.  Neck: Supple, symmetrical, trachea midline, no adenopathy  Lungs:   Clear to auscultation bilaterally, respirations unlabored. Coarse cough during examination. No wheezing, rales, rhonchi, or crackles.  Heart:  Regular rate and rhythm, S1 and S2 normal, no murmur, rub, or gallop  Extremities: Extremities normal, atraumatic, no cyanosis or edema  Pulses: 2+ and symmetric  Skin: Skin color, texture, turgor normal, no rashes or lesions  Lymph nodes: Cervical, supraclavicular, and axillary nodes normal  Neurologic: Normal    Assessment & Plan:    Exam findings, diagnosis etiology and medication use and indications reviewed with patient. Follow-Up and discharge instructions provided. No emergent/urgent issues found on exam.  Patient education was provided.   Patient verbalized understanding of information provided and agrees with plan of care (POC), all questions answered. The patient is advised to call or return to clinic if condition does not see an improvement in symptoms, or to seek the care of the closest emergency department if condition worsens with the below plan.    1. Upper respiratory tract infection, unspecified type  2. Cough  - predniSONE (DELTASONE) 50 MG tablet; Take 1 tablet (50 mg total) by mouth daily with breakfast for 5 days.  Dispense: 5 tablet; Refill: 0 - brompheniramine-pseudoephedrine-DM 30-2-10 MG/5ML syrup; Take 5 mLs by mouth 4 (four) times daily as needed.  Dispense: 120 mL; Refill: 0  Patient with five day history of URI symptoms; primary complaint sinus pressure/congestion and cough. Afebrile, VSS, in no acute distress, clear lung sounds. Prescribed 5-day course of prednisone for decreasing of sinus mucosal edema and decrease of swelling of bronchial tubes. Prescribed  Bromphed cough syrup. Advised patient return to [facility] or follow-up with PCP or urgent care in 4-5 days if symptoms not improving, sooner with any worsening symptoms.   Darlin Priestly, MHS, PA-C Montey Hora, MHS, PA-C Advanced Practice Provider [facility]  [address] [facility], [place], [place] [address] (p):  [phone] [email or web address]

## 2018-04-11 NOTE — Patient Instructions (Signed)
Thank you for choosing [facility] for your health care needs.  You have been diagnosed with an upper respiratory infection (a cold).  Recommend increase fluids; water, Gatorade, hot tea with lemon/honey, orange juice. Rest. Use several pillows to prop yourself up at night, will help with cough. Use cool mist humidifier in bedroom.  You have been prescribed a steroid, prednisone. Take 1 pill once a day x 5 days. Take with food to prevent stomach upset. Will cause sugars to increase.  You have been prescribed a prescription cough syrup.  Follow-up at [facility], with your family physician, or at urgent care if symptoms not improving in 4-5 days. Follow-up sooner with any worsening symptoms.  Upper Respiratory Infection, Adult An upper respiratory infection (URI) affects the nose, throat, and upper air passages. URIs are caused by germs (viruses). The most common type of URI is often called "the common cold." Medicines cannot cure URIs, but you can do things at home to relieve your symptoms. URIs usually get better within 7-10 days. Follow these instructions at home: Activity  Rest as needed.  If you have a fever, stay home from work or school until your fever is gone, or until your doctor says you may return to work or school. ? You should stay home until you cannot spread the infection anymore (you are not contagious). ? Your doctor may have you wear a face mask so you have less risk of spreading the infection. Relieving symptoms  Gargle with a salt-water mixture 3-4 times a day or as needed. To make a salt-water mixture, completely dissolve -1 tsp of salt in 1 cup of warm water.  Use a cool-mist humidifier to add moisture to the air. This can help you breathe more easily. Eating and drinking   Drink enough fluid to keep your pee (urine) pale yellow.  Eat soups and other clear broths. General instructions   Take over-the-counter and prescription medicines only as told by your  doctor. These include cold medicines, fever reducers, and cough suppressants.  Do not use any products that contain nicotine or tobacco. These include cigarettes and e-cigarettes. If you need help quitting, ask your doctor.  Avoid being where people are smoking (avoid secondhand smoke).  Make sure you get regular shots and get the flu shot every year.  Keep all follow-up visits as told by your doctor. This is important. How to avoid spreading infection to others   Wash your hands often with soap and water. If you do not have soap and water, use hand sanitizer.  Avoid touching your mouth, face, eyes, or nose.  Cough or sneeze into a tissue or your sleeve or elbow. Do not cough or sneeze into your hand or into the air. Contact a doctor if:  You are getting worse, not better.  You have any of these: ? A fever. ? Chills. ? Brown or red mucus in your nose. ? Yellow or brown fluid (discharge)coming from your nose. ? Pain in your face, especially when you bend forward. ? Swollen neck glands. ? Pain with swallowing. ? White areas in the back of your throat. Get help right away if:  You have shortness of breath that gets worse.  You have very bad or constant: ? Headache. ? Ear pain. ? Pain in your forehead, behind your eyes, and over your cheekbones (sinus pain). ? Chest pain.  You have long-lasting (chronic) lung disease along with any of these: ? Wheezing. ? Long-lasting cough. ? Coughing up blood. ? A  change in your usual mucus.  You have a stiff neck.  You have changes in your: ? Vision. ? Hearing. ? Thinking. ? Mood. Summary  An upper respiratory infection (URI) is caused by a germ called a virus. The most common type of URI is often called "the common cold."  URIs usually get better within 7-10 days.  Take over-the-counter and prescription medicines only as told by your doctor. This information is not intended to replace advice given to you by your health care  provider. Make sure you discuss any questions you have with your health care provider. Document Released: 09/16/2007 Document Revised: 11/20/2016 Document Reviewed: 11/20/2016 Elsevier Interactive Patient Education  2019 [employer].

## 2018-04-12 DIAGNOSIS — H524 Presbyopia: Secondary | ICD-10-CM | POA: Diagnosis not present

## 2018-04-12 DIAGNOSIS — E119 Type 2 diabetes mellitus without complications: Secondary | ICD-10-CM | POA: Diagnosis not present

## 2018-04-12 LAB — HM DIABETES EYE EXAM

## 2018-04-14 ENCOUNTER — Telehealth: Payer: Self-pay | Admitting: Emergency Medicine

## 2018-04-14 NOTE — Telephone Encounter (Signed)
Left message following up on visit with [facility] 

## 2018-04-18 ENCOUNTER — Ambulatory Visit (INDEPENDENT_AMBULATORY_CARE_PROVIDER_SITE_OTHER): Payer: Self-pay | Admitting: Physician Assistant

## 2018-04-18 ENCOUNTER — Encounter: Payer: Self-pay | Admitting: Physician Assistant

## 2018-04-18 VITALS — BP 112/80 | HR 82 | Temp 97.8°F | Resp 15 | Ht 65.0 in | Wt 238.0 lb

## 2018-04-18 DIAGNOSIS — J209 Acute bronchitis, unspecified: Secondary | ICD-10-CM

## 2018-04-18 MED ORDER — BENZONATATE 100 MG PO CAPS: 100.0000 mg | ORAL_CAPSULE | Freq: Three times a day (TID) | ORAL | 0 refills | Status: DC | PRN

## 2018-04-18 MED ORDER — PSEUDOEPH-BROMPHEN-DM 30-2-10 MG/5ML PO SYRP: 5.0000 mL | ORAL_SOLUTION | Freq: Four times a day (QID) | ORAL | 0 refills | Status: DC | PRN

## 2018-04-18 NOTE — Patient Instructions (Signed)
Acute Bronchitis, Adult    Thank you for choosing [facility] for your health care needs.   You have been diagnosed with bronchitis (chest cold).   Recommend increase fluids; water, Gatorade, or hot tea with water or lemon. May use humidifier or vaporizer in bedroom. Use several pillows to prop self up at night, may help with coughing and sleeping. Rest.   Use bromfed cough syrup as needed.  Use tessalon perles during the day to help suppress cough.   If no improvement in 3-5 days or you develop new worse cough or fever, contact our office and we can send in a prescription for an antibiotic at that time.   If you do get the antibiotic filled and do not notice improvement in 1-2 weeks, please follow up with family doctor.   Hope you feel better soon.      Acute bronchitis is when air tubes (bronchi) in the lungs suddenly get swollen. The condition can make it hard to breathe. It can also cause these symptoms:  A cough.  Coughing up clear, yellow, or green mucus.  Wheezing.  Chest congestion.  Shortness of breath.  A fever.  Body aches.  Chills.  A sore throat. Follow these instructions at home:  Medicines  Take over-the-counter and prescription medicines only as told by your doctor.  If you were prescribed an antibiotic medicine, take it as told by your doctor. Do not stop taking the antibiotic even if you start to feel better. General instructions  Rest.  Drink enough fluids to keep your pee (urine) pale yellow.  Avoid smoking and secondhand smoke. If you smoke and you need help quitting, ask your doctor. Quitting will help your lungs heal faster.  Use an inhaler, cool mist vaporizer, or humidifier as told by your doctor.  Keep all follow-up visits as told by your doctor. This is important. How is this prevented? To lower your risk of getting this condition again:  Wash your hands often with soap and water. If you cannot use soap and water, use hand  sanitizer.  Avoid contact with people who have cold symptoms.  Try not to touch your hands to your mouth, nose, or eyes.  Make sure to get the flu shot every year. Contact a doctor if:  Your symptoms do not get better in 2 weeks. Get help right away if:  You cough up blood.  You have chest pain.  You have very bad shortness of breath.  You become dehydrated.  You faint (pass out) or keep feeling like you are going to pass out.  You keep throwing up (vomiting).  You have a very bad headache.  Your fever or chills gets worse. This information is not intended to replace advice given to you by your health care provider. Make sure you discuss any questions you have with your health care provider. Document Released: 09/16/2007 Document Revised: 11/11/2016 Document Reviewed: 09/18/2015 Elsevier Interactive Patient Education  2019 [employer].

## 2018-04-18 NOTE — Progress Notes (Addendum)
MRN: [id number] DOB: May 16, 1955  Subjective:   Cindy Trujillo is a 63 y.o. female presenting for chief complaint of Cough (x1wk) and Sinus Problem (x1wk) .  Reports 1.5 wk history of illness. Started out with head cold then went to her chest. Having coarse cough (mix of dry hacking and productive). Seen on 04/11/18 and dx with URI w/ cough. Given Rx for bromfed and prednisone. Mucus is less and lighter in appearance.  Notes she feels about 75% better but the cough is still lingering. Denies fever, SOB, DOE, wheezing, chest tightness, chest pain, sinus pain, sore throat, ear pain, N/V/D. Would like a refill for bromfed because she believes it is really helping.  Patient has had flu shot this season. Denies smoking.  PMH of DM, last A1C 03/2018 was 7.3. No PMH of HTN, asthma, or COPD. Denies recent travel. Denies any other aggravating or relieving factors, no other questions or concerns.  Review of Systems  Constitutional: Negative for chills, diaphoresis and malaise/fatigue.  HENT: Negative for sinus pain.   Respiratory: Negative for hemoptysis.   Musculoskeletal: Negative for myalgias.  Neurological: Negative for dizziness and headaches.    Cindy Trujillo has a current medication list which includes the following prescription(s): accu-chek fastclix lancets, dapagliflozin propanediol, ferrous sulfate, glucose blood, lisinopril, loratadine, metformin, and omeprazole. Also is allergic to sulfa antibiotics.  Cindy Trujillo  has a past medical history of Diabetes mellitus without complication ([place]), GERD (gastroesophageal reflux disease), and Hyperlipidemia. Also  has a past surgical history that includes Tonsillectomy and adenoidectomy (1968) and Colonoscopy with propofol (N/A, 08/23/2017).   Objective:   Vitals: BP 112/80 (BP Location: Right Arm, Patient Position: Sitting, Cuff Size: Normal)   Pulse 82   Temp 97.8 F (36.6 C)   Ht 5' 5"  (1.651 m)   Wt 238 lb (108 kg)   SpO2 97%   BMI 39.61 kg/m   Physical  Exam Vitals signs reviewed.  Constitutional:      General: She is not in acute distress.    Appearance: She is well-developed and well-groomed. She is not ill-appearing, toxic-appearing or diaphoretic.  HENT:     Head: Normocephalic and atraumatic.     Right Ear: Tympanic membrane, ear canal and external ear normal.     Left Ear: Ear canal and external ear normal. Tympanic membrane is injected. Tympanic membrane is not erythematous or bulging.     Nose: Nose normal. No mucosal edema.     Right Sinus: No maxillary sinus tenderness or frontal sinus tenderness.     Left Sinus: No maxillary sinus tenderness or frontal sinus tenderness.     Mouth/Throat:     Lips: Pink.     Mouth: Mucous membranes are moist.     Pharynx: Oropharynx is clear. Uvula midline.     Tonsils: No tonsillar exudate or tonsillar abscesses.  Eyes:     Conjunctiva/sclera: Conjunctivae normal.  Neck:     Musculoskeletal: Normal range of motion.  Cardiovascular:     Rate and Rhythm: Normal rate and regular rhythm.     Heart sounds: Normal heart sounds.  Pulmonary:     Effort: Pulmonary effort is normal.     Breath sounds: Normal breath sounds. No decreased breath sounds, wheezing, rhonchi or rales.  Lymphadenopathy:     Head:     Right side of head: No submental, submandibular, tonsillar, preauricular, posterior auricular or occipital adenopathy.     Left side of head: No submental, submandibular, tonsillar, preauricular, posterior auricular or occipital adenopathy.  Cervical: No cervical adenopathy.     Upper Body:     Right upper body: No supraclavicular adenopathy.     Left upper body: No supraclavicular adenopathy.  Skin:    General: Skin is warm and dry.  Neurological:     Mental Status: She is alert.     No results found for this or any previous visit (from the past 24 hour(s)).  Assessment and Plan :  1. Acute bronchitis, unspecified organism Pt is overall well appearing, NAD. VSS. She is  afebrile. Lungs CTAB. Her sinus pressure completely resolved and her cough is 75% better since initial visit. Has not developed any new concerning sx or worsening of initial sx, which is reassuring. No red flags noted. Suspect this is all still viral in etiology and she should continue symptomatic/supportive measures at this time. No concerning hx for secondary sickening.  If no improvement in 3-5 days, pt advised to contact our office. Would consider Rx for oral abx (either a zpack or doxy) to cover for potential atypical lung etiology at that time. Encouraged to seek care immediately if symptoms worsen/develops new concerning sx. Pt voices understanding.   Meds ordered this encounter  Medications  . brompheniramine-pseudoephedrine-DM 30-2-10 MG/5ML syrup    Sig: Take 5 mLs by mouth 4 (four) times daily as needed.    Dispense:  120 mL    Refill:  0    Order Specific Question:   Supervising Provider    Answer:   MILLER, BRIAN [[id number]]  . benzonatate (TESSALON) 100 MG capsule    Sig: Take 1-2 capsules (100-200 mg total) by mouth 3 (three) times daily as needed for cough.    Dispense:  40 capsule    Refill:  0    Order Specific Question:   Supervising Provider    Answer:   Sabra Heck, BRIAN [[id number]]     Tenna Delaine, [facility] 04/18/2018 3:55 PM

## 2018-04-21 ENCOUNTER — Telehealth: Payer: Self-pay | Admitting: Emergency Medicine

## 2018-04-21 NOTE — Telephone Encounter (Signed)
Left message on patient cell phone that was given to me via her husband [phone] This was a followup call from visit with [facility]

## 2018-04-22 ENCOUNTER — Telehealth: Payer: Self-pay | Admitting: Emergency Medicine

## 2018-04-22 MED ORDER — DOXYCYCLINE HYCLATE 100 MG PO TABS: 100.0000 mg | ORAL_TABLET | Freq: Two times a day (BID) | ORAL | 0 refills | Status: AC

## 2018-04-22 NOTE — Telephone Encounter (Signed)
Patient returned phone call stated that she is no better still has cough,congestion and yellow mucus. Per conversation with provider at last visit would consider a zpack or doxy to be sent to [facility] pharmacy if not any better. Please advise

## 2018-04-22 NOTE — Telephone Encounter (Signed)
Reviewed office visit note from 04/11/18 and 04/18/18. Due to now duration of illness, feel an antibiotic would be warranted.  Will seen Doxy[place]ine 159m bid [place] 7 [place]  Doxycycline can cause sensitivity to the sun/easier to develop a sun burn. Be careful about sun exposure.  Continue to use Bromphed cough syrup if helpful. May use cool mist humdifier in bedroom at night. Prop self up with several pillows at night, can help with cough. During the day, may find throat lozenges/cough suppressants such as Hall's helpful.  If no improvement in 4-5 days on antibiotic, recommend patient follow-up with family physician or urgent care for further evaluation. Patient should seek immediate re-evaluation if she develops fever, chest pain, SOB, or other new/concerning symptom.  Thanks, SFS PA-C

## 2018-04-22 NOTE — Telephone Encounter (Signed)
Spoke to patient informed her of prescription sent to pharmacy also advised her of precaution when taking antibiotic. Per patient acknowledge understanding

## 2018-05-09 ENCOUNTER — Telehealth: Payer: Self-pay | Admitting: Family Medicine

## 2018-05-09 DIAGNOSIS — I8393 Asymptomatic varicose veins of bilateral lower extremities: Secondary | ICD-10-CM

## 2018-05-09 NOTE — Telephone Encounter (Signed)
Patient wants to get another referral to Dr. Ozella Almond office. She was unable to make one last year.

## 2018-05-10 NOTE — Telephone Encounter (Signed)
Ordered placed

## 2018-05-13 ENCOUNTER — Encounter (INDEPENDENT_AMBULATORY_CARE_PROVIDER_SITE_OTHER): Payer: Self-pay | Admitting: Vascular Surgery

## 2018-05-20 ENCOUNTER — Ambulatory Visit (INDEPENDENT_AMBULATORY_CARE_PROVIDER_SITE_OTHER): Payer: 59 | Admitting: Vascular Surgery

## 2018-05-20 ENCOUNTER — Encounter (INDEPENDENT_AMBULATORY_CARE_PROVIDER_SITE_OTHER): Payer: Self-pay | Admitting: Vascular Surgery

## 2018-05-20 VITALS — BP 137/83 | HR 93 | Resp 14 | Ht 65.0 in | Wt 237.0 lb

## 2018-05-20 DIAGNOSIS — E782 Mixed hyperlipidemia: Secondary | ICD-10-CM

## 2018-05-20 DIAGNOSIS — I83813 Varicose veins of bilateral lower extremities with pain: Secondary | ICD-10-CM | POA: Insufficient documentation

## 2018-05-20 DIAGNOSIS — E119 Type 2 diabetes mellitus without complications: Secondary | ICD-10-CM | POA: Diagnosis not present

## 2018-05-20 NOTE — Patient Instructions (Signed)
Varicose Veins Varicose veins are veins that have become enlarged, bulged, and twisted. They most often appear in the legs. What are the causes? This condition is caused by damage to the valves in the vein. These valves help blood return to your heart. When they are damaged and they stop working properly, blood may flow backward and back up in the veins near the skin, causing the veins to get larger and appear twisted. The condition can result from any issue that causes blood to back up, like pregnancy, prolonged standing, or obesity. What increases the risk? This condition is more likely to develop in people who are:  On their feet a lot.  Pregnant.  Overweight. What are the signs or symptoms? Symptoms of this condition include:  Bulging, twisted, and bluish veins.  A feeling of heaviness. This may be worse at the end of the day.  Leg pain. This may be worse at the end of the day.  Swelling in the leg.  Changes in skin color over the veins. How is this diagnosed? This condition may be diagnosed based on your symptoms, a physical exam, and an ultrasound test. How is this treated? Treatment for this condition may involve:  Avoiding sitting or standing in one position for long periods of time.  Wearing compression stockings. These stockings help to prevent blood clots and reduce swelling in the legs.  Raising (elevating) the legs when resting.  Losing weight.  Exercising regularly. If you have persistent symptoms or want to improve the way your varicose veins look, you may choose to have a procedure to close the varicose veins off or to remove them. Treatments to close off the veins include:  Sclerotherapy. In this treatment, a solution is injected into a vein to close it off.  Laser treatment. In this treatment, the vein is heated with a laser to close it off.  Radiofrequency vein ablation. In this treatment, an electrical current produced by radio waves is used to close  off the vein. Treatments to remove the veins include:  Phlebectomy. In this treatment, the veins are removed through small incisions made over the veins.  Vein ligation and stripping. In this treatment, incisions are made over the veins. The veins are then removed after being tied (ligated) with stitches (sutures). Follow these instructions at home: Activity  Walk as much as possible. Walking increases blood flow. This helps blood return to the heart and takes pressure off your veins. It also increases your cardiovascular strength.  Follow your health care provider's instructions about exercising.  Do not stand or sit in one position for a long period of time.  Do not sit with your legs crossed.  Rest with your legs raised during the day. General instructions   Follow any diet instructions given to you by your health care provider.  Wear compression stockings as directed by your health care provider. Do not wear other kinds of tight clothing around your legs, pelvis, or waist.  Elevate your legs at night to above the level of your heart.  If you get a cut in the skin over the varicose vein and the vein bleeds: ? Lie down with your leg raised. ? Apply firm pressure to the cut with a clean cloth until the bleeding stops. ? Place a bandage (dressing) on the cut. Contact a health care provider if:  The skin around your varicose veins starts to break down.  You have pain, redness, tenderness, or hard swelling over a vein.  You  are uncomfortable because of pain.  You get a cut in the skin over a varicose vein and it will not stop bleeding. Summary  Varicose veins are veins that have become enlarged, bulged, and twisted. They most often appear in the legs.  This condition is caused by damage to the valves in the vein. These valves help blood return to your heart.  Treatment for this condition includes frequent movements, wearing compression stockings, losing weight, and  exercising regularly. In some cases, procedures are done to close off or remove the veins.  Treatment for this condition may include wearing compression stockings, elevating the legs, losing weight, and engaging in regular activity. In some cases, procedures are done to close off or remove the veins. This information is not intended to replace advice given to you by your health care provider. Make sure you discuss any questions you have with your health care provider. Document Released: 01/07/2005 Document Revised: 04/22/2016 Document Reviewed: 04/22/2016 Elsevier Interactive Patient Education  2019 [employer].

## 2018-05-20 NOTE — Progress Notes (Signed)
[name], female   DOB: 09/13/1955, 63 y.o.   MRN: [id number]  Chief Complaint  Patient presents with  . New Patient (Initial Visit)    HPI Cindy Trujillo is a 63 y.o. female.  I am asked to see the patient by Dr. Brita Romp for evaluation of painful varicose veins.  The patient presents with complaints of symptomatic varicosities of the both legs. The patient reports a long standing history of varicosities and they have become painful over time. There was no clear inciting event or causative factor that started the symptoms.  The left leg is more severly affected. The patient elevates the legs for relief. The pain is described as stinging and burning. The symptoms are generally most severe in the evening, particularly when they have been on their feet for long periods of time. Elevation has been used to try to improve the symptoms with limited success. The patient complains of worsening swelling as an associated symptom. The patient has no previous history of deep venous thrombosis or superficial thrombophlebitis to their knowledge.     Past Medical History:  Diagnosis Date  . Diabetes mellitus without complication ([place])   . GERD (gastroesophageal reflux disease)   . Hyperlipidemia     Past Surgical History:  Procedure Laterality Date  . COLONOSCOPY WITH PROPOFOL N/A 08/23/2017   Procedure: COLONOSCOPY WITH PROPOFOL;  Surgeon: Manya Silvas, MD;  Location: [facility] ENDOSCOPY;  Service: Endoscopy;  Laterality: N/A;  . TONSILLECTOMY AND ADENOIDECTOMY  1968    Family History  Problem Relation Age of Onset  . COPD Mother        heavy smoker  . Pulmonary fibrosis Mother   . Fibromyalgia Mother   . COPD Father   . Melanoma Father   . COPD Sister   . COPD Brother   . COPD Sister   . COPD Sister   . Healthy Brother   . Heart failure Maternal Grandmother   . Heart attack Maternal Grandfather   . Cancer Paternal Grandmother        unsure of type, had mets in  colon  . Heart attack Paternal Grandfather   . Cancer Paternal Grandfather        unknown  . Breast cancer Neg Hx   . Cervical cancer Neg Hx     Social History Social History   Tobacco Use  . Smoking status: Never Smoker  . Smokeless tobacco: Never Used  Substance Use Topics  . Alcohol use: No    Alcohol/week: 0.0 standard drinks  . Drug use: No    Allergies  Allergen Reactions  . Sulfa Antibiotics Hives    Current Outpatient Medications  Medication Sig Dispense Refill  . ACCU-CHEK FASTCLIX LANCETS MISC Use as directed to check blood sugar 1-2 times daily 100 each 3  . dapagliflozin propanediol (FARXIGA) 10 MG TABS tablet Take 10 mg by mouth daily. 90 tablet 3  . ferrous sulfate 325 (65 FE) MG tablet Take by mouth.    Marland Kitchen glucose blood (ACCU-CHEK GUIDE) test strip Use as instructed 100 each 3  . lisinopril (PRINIVIL,ZESTRIL) 5 MG tablet Take 1 tablet (5 mg total) by mouth daily. 90 tablet 3  . loratadine (CLARITIN) 10 MG tablet Take 1 tablet (10 mg total) by mouth daily. 90 tablet 3  . metFORMIN (GLUCOPHAGE) 1000 MG tablet Take 1 tablet (1,000 mg total) by mouth 2 (two) times daily with a meal. 180 tablet 3  . omeprazole (PRILOSEC) 20 MG  capsule Take 1 capsule (20 mg total) by mouth daily. 90 capsule 3   No current facility-administered medications for this visit.       REVIEW OF SYSTEMS (Negative unless checked)  Constitutional: [] Weight loss  [] Fever  [] Chills Cardiac: [] Chest pain   [] Chest pressure   [] Palpitations   [] Shortness of breath when laying flat   [] Shortness of breath at rest   [] Shortness of breath with exertion. Vascular:  [] Pain in legs with walking   [] Pain in legs at rest   [] Pain in legs when laying flat   [] Claudication   [] Pain in feet when walking  [] Pain in feet at rest  [] Pain in feet when laying flat   [] History of DVT   [] Phlebitis   [x] Swelling in legs   [x] Varicose veins   [] Non-healing ulcers Pulmonary:   [] Uses home oxygen   [] Productive  cough   [] Hemoptysis   [] Wheeze  [] COPD   [] Asthma Neurologic:  [] Dizziness  [] Blackouts   [] Seizures   [] History of stroke   [] History of TIA  [] Aphasia   [] Temporary blindness   [] Dysphagia   [] Weakness or numbness in arms   [] Weakness or numbness in legs Musculoskeletal:  [] Arthritis   [] Joint swelling   [] Joint pain   [] Low back pain Hematologic:  [] Easy bruising  [] Easy bleeding   [] Hypercoagulable state   [] Anemic  [] Hepatitis Gastrointestinal:  [] Blood in stool   [] Vomiting blood  [x] Gastroesophageal reflux/heartburn   [] Abdominal pain Genitourinary:  [] Chronic kidney disease   [] Difficult urination  [] Frequent urination  [] Burning with urination   [] Hematuria Skin:  [] Rashes   [] Ulcers   [] Wounds Psychological:  [] History of anxiety   []  History of major depression.    Physical Exam BP 137/83 (BP Location: Right Arm, Patient Position: Sitting)   Pulse 93   Resp 14   Ht 5' 5"  (1.651 m)   Wt 237 lb (107.5 kg)   BMI 39.44 kg/m  Gen:  WD/WN, NAD Head: [place]/AT, No temporalis wasting.  Ear/Nose/Throat: Hearing grossly intact Eyes: Sclera non-icteric. Conjunctiva clear Neck: Supple. Trachea midline Pulmonary:  Good air movement, no use of accessory muscles, respirations not labored.  Cardiac: RRR, No JVD Vascular: Varicosities diffuse and measuring up to 2-3 mm in the right lower extremity        Varicosities diffuse and measuring up to 2-3 mm in the left lower extremity Vessel Right Left  Radial Palpable Palpable                          PT Palpable Palpable  DP Palpable Palpable   Gastrointestinal: soft, non-tender/non-distended.  Musculoskeletal: M/S 5/5 throughout.  Mild BLE edema Neurologic: Sensation grossly intact in extremities.  Symmetrical.  Speech is fluent.  Psychiatric: Judgment intact, Mood & affect appropriate for pt's clinical situation. Dermatologic: No rashes or ulcers noted.  No cellulitis or open wounds.    Radiology No results found.  Labs Recent  Results (from the past 2160 hour(s))  POCT glycosylated hemoglobin (Hb A1C)     Status: Abnormal   Collection Time: 03/17/18  4:06 PM  Result Value Ref Range   Hemoglobin A1C 7.3 (A) 4.0 - 5.6 %   HbA1c POC (<> result, manual entry)     HbA1c, POC (prediabetic range)     HbA1c, POC (controlled diabetic range)     Est. average glucose Bld gHb Est-mCnc 163   POCT Urinalysis Dipstick     Status: Abnormal   Collection Time:  03/30/18  2:40 PM  Result Value Ref Range   Color, UA lt yellow    Clarity, UA clear    Glucose, UA Positive (A) Negative   Bilirubin, UA neg    Ketones, UA neg    Spec Grav, UA 1.015 1.010 - 1.025   Blood, UA neg    pH, UA 5.5 5.0 - 8.0   Protein, UA Negative Negative   Urobilinogen, UA 0.2 0.2 or 1.0 E.U./dL   Nitrite, UA neg    Leukocytes, UA Negative Negative   Appearance     Odor    CBC w/Diff/Platelet     Status: Abnormal   Collection Time: 03/30/18  4:27 PM  Result Value Ref Range   WBC 5.6 3.4 - 10.8 x10E3/uL   RBC 4.42 3.77 - 5.28 x10E6/uL   Hemoglobin 11.4 11.1 - 15.9 g/dL   Hematocrit 33.6 (L) 34.0 - 46.6 %   MCV 76 (L) 79 - 97 fL   MCH 25.8 (L) 26.6 - 33.0 pg   MCHC 33.9 31.5 - 35.7 g/dL   RDW 15.6 (H) 12.3 - 15.4 %    Comment: **Effective April 18, 2018, the RDW pediatric reference**   interval will be removed and the adult reference interval   will be changing to:                             Female 11.7 - 15.4                                                      Female 11.6 - 15.4    Platelets 127 (L) 150 - 450 x10E3/uL   Neutrophils 53 Not Estab. %   Lymphs 36 Not Estab. %   Monocytes 7 Not Estab. %   Eos 3 Not Estab. %   Basos 1 Not Estab. %   Neutrophils Absolute 3.0 1.4 - 7.0 x10E3/uL   Lymphocytes Absolute 2.0 0.7 - 3.1 x10E3/uL   Monocytes Absolute 0.4 0.1 - 0.9 x10E3/uL   EOS (ABSOLUTE) 0.2 0.0 - 0.4 x10E3/uL   Basophils Absolute 0.0 0.0 - 0.2 x10E3/uL   Immature Granulocytes 0 Not Estab. %   Immature Grans (Abs) 0.0 0.0 - 0.1  x10E3/uL  Comprehensive metabolic panel     Status: Abnormal   Collection Time: 03/30/18  4:27 PM  Result Value Ref Range   Glucose 100 (H) 65 - 99 mg/dL   BUN 16 8 - 27 mg/dL   Creatinine, Ser 0.91 0.57 - 1.00 mg/dL   GFR calc non Af Amer 69 >59 mL/min/1.73   GFR calc Af Amer 79 >59 mL/min/1.73   BUN/Creatinine Ratio 18 12 - 28   Sodium 139 134 - 144 mmol/L   Potassium 3.9 3.5 - 5.2 mmol/L   Chloride 105 96 - 106 mmol/L   CO2 19 (L) 20 - 29 mmol/L   Calcium 9.5 8.7 - 10.3 mg/dL   Total Protein 7.0 6.0 - 8.5 g/dL   Albumin 4.2 3.6 - 4.8 g/dL   Globulin, Total 2.8 1.5 - 4.5 g/dL   Albumin/Globulin Ratio 1.5 1.2 - 2.2   Bilirubin Total 0.7 0.0 - 1.2 mg/dL   Alkaline Phosphatase 99 39 - 117 IU/L   AST 42 (H) 0 - 40 IU/L   ALT 32  0 - 32 IU/L  Lipase     Status: None   Collection Time: 03/30/18  4:27 PM  Result Value Ref Range   Lipase 35 14 - 72 U/L  Lipid panel     Status: Abnormal   Collection Time: 03/30/18  4:27 PM  Result Value Ref Range   Cholesterol, Total 195 100 - 199 mg/dL   Triglycerides 327 (H) 0 - 149 mg/dL   HDL 43 >39 mg/dL   VLDL Cholesterol Cal 65 (H) 5 - 40 mg/dL   LDL Calculated 87 0 - 99 mg/dL   Chol/HDL Ratio 4.5 (H) 0.0 - 4.4 ratio    Comment:                                   T. Chol/HDL Ratio                                             Men  Women                               1/2 Avg.Risk  3.4    3.3                                   Avg.Risk  5.0    4.4                                2X Avg.Risk  9.6    7.1                                3X Avg.Risk 23.4   11.0   HM DIABETES EYE EXAM     Status: None   Collection Time: 04/12/18 12:00 AM  Result Value Ref Range   HM Diabetic Eye Exam No Retinopathy No Retinopathy    Assessment/Plan:  Hyperlipidemia lipid control important in reducing the progression of atherosclerotic disease. Continue statin therapy   Diabetes mellitus without complication (HCC) blood glucose control important in reducing  the progression of atherosclerotic disease. Also, involved in wound healing. On appropriate medications.   Varicose veins of leg with pain, bilateral   The patient has symptoms consistent with chronic venous insufficiency. We discussed the natural history and treatment options for venous disease. I recommended the regular use of 20 - 30 mm Hg compression stockings, and prescribed these today. I recommended leg elevation and anti-inflammatories as needed for pain. I have also recommended a complete venous duplex to assess the venous system for reflux or thrombotic issues. This can be done at the patient's convenience. I will see the patient back in 3 months to assess the response to conservative management, and determine further treatment options.     Leotis Pain 05/20/2018, 3:48 PM   This note was created with Dragon medical transcription system.  Any errors from dictation are unintentional.

## 2018-05-20 NOTE — Assessment & Plan Note (Signed)
blood glucose control important in reducing the progression of atherosclerotic disease. Also, involved in wound healing. On appropriate medications.  

## 2018-05-20 NOTE — Assessment & Plan Note (Signed)
lipid control important in reducing the progression of atherosclerotic disease. Continue statin therapy  

## 2018-06-23 ENCOUNTER — Ambulatory Visit: Payer: 59 | Admitting: Family Medicine

## 2018-06-23 ENCOUNTER — Encounter: Payer: Self-pay | Admitting: Family Medicine

## 2018-06-23 ENCOUNTER — Other Ambulatory Visit: Payer: Self-pay

## 2018-06-23 VITALS — BP 111/73 | HR 82 | Temp 98.5°F | Wt 235.0 lb

## 2018-06-23 DIAGNOSIS — E1129 Type 2 diabetes mellitus with other diabetic kidney complication: Secondary | ICD-10-CM | POA: Insufficient documentation

## 2018-06-23 DIAGNOSIS — E782 Mixed hyperlipidemia: Secondary | ICD-10-CM

## 2018-06-23 DIAGNOSIS — R809 Proteinuria, unspecified: Secondary | ICD-10-CM | POA: Diagnosis not present

## 2018-06-23 LAB — POCT GLYCOSYLATED HEMOGLOBIN (HGB A1C): Hemoglobin A1C: 7.2 % — AB (ref 4.0–5.6)

## 2018-06-23 MED ORDER — OMEPRAZOLE 20 MG PO CPDR: 20.0000 mg | DELAYED_RELEASE_CAPSULE | Freq: Every day | ORAL | 3 refills | Status: DC

## 2018-06-23 MED ORDER — DAPAGLIFLOZIN PROPANEDIOL 10 MG PO TABS: 10.0000 mg | ORAL_TABLET | Freq: Every day | ORAL | 3 refills | Status: DC

## 2018-06-23 MED ORDER — LISINOPRIL 5 MG PO TABS: 5.0000 mg | ORAL_TABLET | Freq: Every day | ORAL | 3 refills | Status: DC

## 2018-06-23 MED ORDER — METFORMIN HCL 1000 MG PO TABS: 1000.0000 mg | ORAL_TABLET | Freq: Two times a day (BID) | ORAL | 3 refills | Status: DC

## 2018-06-23 NOTE — Assessment & Plan Note (Signed)
Discussed importance of tight lipid control in the setting of diabetes and to reduce progression of atherosclerotic disease She is hesitant to take a statin Recheck lipid panel at upcoming CPE and consider statin therapy again

## 2018-06-23 NOTE — Assessment & Plan Note (Signed)
Discussed importance of healthy weight management Discussed diet and exercise  

## 2018-06-23 NOTE — Assessment & Plan Note (Signed)
A1c has decreased slightly from 7.3-7.2 Still not to goal of less than 7 Discussed possible addition of a third medication, but patient is very hesitant She would like 3 months to work on diet and exercise to improve this We will continue metformin and Farxiga at current doses Up-to-date on eye exam, vaccination On lisinopril Foot exam completed today Patient is hesitant to take a statin and she is not taking one currently We will plan to recheck her A1c and lipids at her CPE in 3 months

## 2018-06-23 NOTE — Progress Notes (Signed)
Patient: Cindy Trujillo Female    DOB: 1955/10/08   63 y.o.   MRN: [id number] Visit Date: 06/23/2018  Today's Provider: Lavon Paganini, MD   Chief Complaint  Patient presents with  . Diabetes   Subjective:    I, Tiburcio Pea, CMA, am acting as a Education administrator for Lavon Paganini, MD.   HPI  Diabetes Mellitus Type II, Follow-up:   RecentLabs       Lab Results  Component Value Date   HGBA1C 7.3 (A) 03/17/2018   HGBA1C 7.8 (A) 12/06/2017   HGBA1C 8.8 08/27/2017      Last seen for diabetes 3 months ago.  Management since then includes continue  Farxiga 5 mg and Metformin 1000 mg She reports good compliance with treatment. She is not having side effects.  Current symptoms include none  Home blood sugar records: arranges from 120-150's  Episodes of hypoglycemia? no              Current Insulin Regimen: none Most Recent Eye Exam: 04/12/2018 Weight trend:  Prior visit with dietician: no Current diet: healthy  Current exercise: walking  Pertinent Labs: Lab Results  Component Value Date   CHOL 195 03/30/2018   HDL 43 03/30/2018   LDLCALC 87 03/30/2018   TRIG 327 (H) 03/30/2018   CHOLHDL 4.5 (H) 03/30/2018    Wt Readings from Last 3 Encounters:  06/23/18 235 lb (106.6 kg)  05/20/18 237 lb (107.5 kg)  04/18/18 238 lb (108 kg)    Allergies  Allergen Reactions  . Sulfa Antibiotics Hives     Current Outpatient Medications:  .  ACCU-CHEK FASTCLIX LANCETS MISC, Use as directed to check blood sugar 1-2 times daily, Disp: 100 each, Rfl: 3 .  dapagliflozin propanediol (FARXIGA) 10 MG TABS tablet, Take 10 mg by mouth daily., Disp: 90 tablet, Rfl: 3 .  ferrous sulfate 325 (65 FE) MG tablet, Take by mouth., Disp: , Rfl:  .  glucose blood (ACCU-CHEK GUIDE) test strip, Use as instructed, Disp: 100 each, Rfl: 3 .  lisinopril (PRINIVIL,ZESTRIL) 5 MG tablet, Take 1 tablet (5 mg total) by mouth daily., Disp: 90 tablet, Rfl: 3 .  loratadine (CLARITIN) 10 MG  tablet, Take 1 tablet (10 mg total) by mouth daily., Disp: 90 tablet, Rfl: 3 .  metFORMIN (GLUCOPHAGE) 1000 MG tablet, Take 1 tablet (1,000 mg total) by mouth 2 (two) times daily with a meal., Disp: 180 tablet, Rfl: 3 .  omeprazole (PRILOSEC) 20 MG capsule, Take 1 capsule (20 mg total) by mouth daily., Disp: 90 capsule, Rfl: 3  Review of Systems  Constitutional: Negative.   Respiratory: Negative.   Cardiovascular: Negative.   Endocrine: Negative.   Musculoskeletal: Negative.     Social History   Tobacco Use  . Smoking status: Never Smoker  . Smokeless tobacco: Never Used  Substance Use Topics  . Alcohol use: No    Alcohol/week: 0.0 standard drinks      Objective:   BP 111/73 (BP Location: Right Arm, Patient Position: Sitting, Cuff Size: Large)   Pulse 82   Temp 98.5 F (36.9 C) (Oral)   Wt 235 lb (106.6 kg)   SpO2 98%   BMI 39.11 kg/m  Vitals:   06/23/18 1602  BP: 111/73  Pulse: 82  Temp: 98.5 F (36.9 C)  TempSrc: Oral  SpO2: 98%  Weight: 235 lb (106.6 kg)     Physical Exam Vitals signs reviewed.  Constitutional:      General: She  is not in acute distress.    Appearance: Normal appearance. She is well-developed. She is not diaphoretic.  HENT:     Head: Normocephalic and atraumatic.  Eyes:     General: No scleral icterus.    Conjunctiva/sclera: Conjunctivae normal.  Neck:     Musculoskeletal: Neck supple.     Thyroid: No thyromegaly.  Cardiovascular:     Rate and Rhythm: Normal rate and regular rhythm.     Pulses: Normal pulses.     Heart sounds: Normal heart sounds. No murmur.  Pulmonary:     Effort: Pulmonary effort is normal. No respiratory distress.     Breath sounds: Normal breath sounds. No wheezing, rhonchi or rales.  Musculoskeletal:     Right lower leg: No edema.     Left lower leg: No edema.  Lymphadenopathy:     Cervical: No cervical adenopathy.  Skin:    General: Skin is warm and dry.     Capillary Refill: Capillary refill takes less  than 2 seconds.     Findings: No rash.  Neurological:     Mental Status: She is alert and oriented to person, place, and time. Mental status is at baseline.  Psychiatric:        Mood and Affect: Mood normal.        Behavior: Behavior normal.    Diabetic Foot Exam - Simple   Simple Foot Form Diabetic Foot exam was performed with the following findings:  Yes 06/23/2018  4:18 PM  Visual Inspection No deformities, no ulcerations, no other skin breakdown bilaterally:  Yes Sensation Testing Intact to touch and monofilament testing bilaterally:  Yes Pulse Check Posterior Tibialis and Dorsalis pulse intact bilaterally:  Yes Comments     Results for orders placed or performed in visit on 06/23/18  POCT HgB A1C  Result Value Ref Range   Hemoglobin A1C 7.2 (A) 4.0 - 5.6 %   HbA1c POC (<> result, manual entry)     HbA1c, POC (prediabetic range)     HbA1c, POC (controlled diabetic range)          Assessment & Plan   Problem List Items Addressed This Visit      Endocrine   T2DM (type 2 diabetes mellitus) ([place]) - Primary    A1c has decreased slightly from 7.3-7.2 Still not to goal of less than 7 Discussed possible addition of a third medication, but patient is very hesitant She would like 3 months to work on diet and exercise to improve this We will continue metformin and Farxiga at current doses Up-to-date on eye exam, vaccination On lisinopril Foot exam completed today Patient is hesitant to take a statin and she is not taking one currently We will plan to recheck her A1c and lipids at her CPE in 3 months      Relevant Medications   lisinopril (PRINIVIL,ZESTRIL) 5 MG tablet   metFORMIN (GLUCOPHAGE) 1000 MG tablet   dapagliflozin propanediol (FARXIGA) 10 MG TABS tablet   Other Relevant Orders   POCT HgB A1C (Completed)     Other   Morbid obesity ([place])    Discussed importance of healthy weight management Discussed diet and exercise      Relevant Medications   metFORMIN  (GLUCOPHAGE) 1000 MG tablet   dapagliflozin propanediol (FARXIGA) 10 MG TABS tablet   Hyperlipidemia    Discussed importance of tight lipid control in the setting of diabetes and to reduce progression of atherosclerotic disease She is hesitant to take a statin Recheck  lipid panel at upcoming CPE and consider statin therapy again      Relevant Medications   lisinopril (PRINIVIL,ZESTRIL) 5 MG tablet   Microalbuminuria    Patient to resume taking lisinopril       Other Visit Diagnoses    Microalbuminuria due to type 2 diabetes mellitus (HCC)       Relevant Medications   lisinopril (PRINIVIL,ZESTRIL) 5 MG tablet   metFORMIN (GLUCOPHAGE) 1000 MG tablet   dapagliflozin propanediol (FARXIGA) 10 MG TABS tablet       Return in about 3 months (around 09/23/2018) for CPE.   The entirety of the information documented in the History of Present Illness, Review of Systems and Physical Exam were personally obtained by me. Portions of this information were initially documented by Tiburcio Pea, CMA and reviewed by me for thoroughness and accuracy.    Virginia Crews, MD, MPH [facility] 06/23/2018 4:32 PM

## 2018-06-23 NOTE — Assessment & Plan Note (Signed)
Patient to resume taking lisinopril

## 2018-07-15 ENCOUNTER — Encounter (INDEPENDENT_AMBULATORY_CARE_PROVIDER_SITE_OTHER): Payer: 59

## 2018-07-15 ENCOUNTER — Ambulatory Visit (INDEPENDENT_AMBULATORY_CARE_PROVIDER_SITE_OTHER): Payer: 59 | Admitting: Nurse Practitioner

## 2018-08-01 ENCOUNTER — Telehealth (INDEPENDENT_AMBULATORY_CARE_PROVIDER_SITE_OTHER): Payer: Self-pay

## 2018-08-01 NOTE — Telephone Encounter (Signed)
I called the patient and left a message on voicemail with medical advice

## 2018-08-01 NOTE — Telephone Encounter (Signed)
We can keep her same appointment. She should continue to wear her stockings and elevate her lower extremities as much as possible.  She has non-invasive testing scheduled at that time and based on her last visit, we need to wait at least 3 months for insurance purposes if there a possible intervention that can be done.  Based on her last visit the pain and swelling is likely due to her varicose veins, which are not urgent in nature.  She can take tylenol or ibuprofen for the pain.

## 2018-08-12 DIAGNOSIS — I82409 Acute embolism and thrombosis of unspecified deep veins of unspecified lower extremity: Secondary | ICD-10-CM

## 2018-08-12 HISTORY — DX: Acute embolism and thrombosis of unspecified deep veins of unspecified lower extremity: I82.409

## 2018-08-18 ENCOUNTER — Ambulatory Visit (INDEPENDENT_AMBULATORY_CARE_PROVIDER_SITE_OTHER): Payer: 59 | Admitting: Nurse Practitioner

## 2018-08-18 ENCOUNTER — Encounter (INDEPENDENT_AMBULATORY_CARE_PROVIDER_SITE_OTHER): Payer: 59

## 2018-08-26 ENCOUNTER — Ambulatory Visit (INDEPENDENT_AMBULATORY_CARE_PROVIDER_SITE_OTHER): Payer: 59 | Admitting: Nurse Practitioner

## 2018-08-26 ENCOUNTER — Other Ambulatory Visit: Payer: Self-pay

## 2018-08-26 ENCOUNTER — Ambulatory Visit (INDEPENDENT_AMBULATORY_CARE_PROVIDER_SITE_OTHER): Payer: 59

## 2018-08-26 ENCOUNTER — Encounter (INDEPENDENT_AMBULATORY_CARE_PROVIDER_SITE_OTHER): Payer: Self-pay | Admitting: Nurse Practitioner

## 2018-08-26 VITALS — BP 113/75 | HR 76 | Resp 16 | Ht 65.0 in | Wt 234.0 lb

## 2018-08-26 DIAGNOSIS — I83813 Varicose veins of bilateral lower extremities with pain: Secondary | ICD-10-CM | POA: Diagnosis not present

## 2018-08-26 DIAGNOSIS — I82411 Acute embolism and thrombosis of right femoral vein: Secondary | ICD-10-CM | POA: Diagnosis not present

## 2018-08-26 DIAGNOSIS — K219 Gastro-esophageal reflux disease without esophagitis: Secondary | ICD-10-CM | POA: Diagnosis not present

## 2018-08-26 DIAGNOSIS — Z7984 Long term (current) use of oral hypoglycemic drugs: Secondary | ICD-10-CM

## 2018-08-26 DIAGNOSIS — R809 Proteinuria, unspecified: Secondary | ICD-10-CM

## 2018-08-26 DIAGNOSIS — E1129 Type 2 diabetes mellitus with other diabetic kidney complication: Secondary | ICD-10-CM | POA: Diagnosis not present

## 2018-08-26 DIAGNOSIS — Z79899 Other long term (current) drug therapy: Secondary | ICD-10-CM | POA: Diagnosis not present

## 2018-08-26 MED ORDER — TRAMADOL HCL 50 MG PO TABS: 50.0000 mg | ORAL_TABLET | Freq: Four times a day (QID) | ORAL | 0 refills | Status: AC | PRN

## 2018-08-26 MED ORDER — APIXABAN 5 MG PO TABS: 5.0000 mg | ORAL_TABLET | Freq: Two times a day (BID) | ORAL | 3 refills | Status: DC

## 2018-08-26 MED ORDER — ELIQUIS 5 MG VTE STARTER PACK: ORAL_TABLET | ORAL | 0 refills | Status: DC

## 2018-08-31 ENCOUNTER — Encounter (INDEPENDENT_AMBULATORY_CARE_PROVIDER_SITE_OTHER): Payer: Self-pay | Admitting: Nurse Practitioner

## 2018-08-31 NOTE — Progress Notes (Signed)
SUBJECTIVE:  [name], female    DOB: [birth year], 63 y.o.   MRN: [id number] Chief Complaint  Patient presents with  . Follow-up    ultrasound follow up    HPI  EDYE Trujillo is a 63 y.o. female The patient returns for followup evaluation 3 months after the initial visit. The patient continues to have pain in the lower extremities with dependency. The pain is lessened with elevation. Graduated compression stockings, Class I (20-30 mmHg), have been worn but the stockings do not eliminate the leg pain. Over-the-counter analgesics do not improve the symptoms. The degree of discomfort continues to interfere with daily activities. The patient notes the pain in the legs is causing problems with daily exercise, at the workplace and even with household activities and maintenance such as standing in the kitchen preparing meals and doing dishes.   Venous ultrasound shows DVT extending from the femoral to popliteal vein of the right lower extremity.  Superficial reflux is present in the left lower extremity.   Based upon the patient's pictures, the patient likely started having her DVT around May 4th.  She denies having any previous history of DVT.  She reports extreme swelling and redness of the right lower extremity, but denies shortness of breath or chest pain.   Past Medical History:  Diagnosis Date  . Diabetes mellitus without complication (Cindy Trujillo)   . GERD (gastroesophageal reflux disease)   . Hyperlipidemia     Past Surgical History:  Procedure Laterality Date  . COLONOSCOPY WITH PROPOFOL N/A 08/23/2017   Procedure: COLONOSCOPY WITH PROPOFOL;  Surgeon: Manya Silvas, MD;  Location: [facility] ENDOSCOPY;  Service: Endoscopy;  Laterality: N/A;  . TONSILLECTOMY AND ADENOIDECTOMY  1968    Social History   Socioeconomic History  . Marital status: Married    Spouse name: Cindy Trujillo  . Number of children: 0  . Years of education: some college  . Highest education level: Not on  file  Occupational History  . Occupation: CARE MANGEMENT    Employer: [employer]  Social Needs  . Financial resource strain: Not on file  . Food insecurity:    Worry: Not on file    Inability: Not on file  . Transportation needs:    Medical: Not on file    Non-medical: Not on file  Tobacco Use  . Smoking status: Never Smoker  . Smokeless tobacco: Never Used  Substance and Sexual Activity  . Alcohol use: No    Alcohol/week: 0.0 standard drinks  . Drug use: No  . Sexual activity: Not Currently  Lifestyle  . Physical activity:    Days per week: Not on file    Minutes per session: Not on file  . Stress: Not on file  Relationships  . Social connections:    Talks on phone: Not on file    Gets together: Not on file    Attends religious service: Not on file    Active member of club or organization: Not on file    Attends meetings of clubs or organizations: Not on file    Relationship status: Not on file  . Intimate partner violence:    Fear of current or ex partner: Not on file    Emotionally abused: Not on file    Physically abused: Not on file    Forced sexual activity: Not on file  Other Topics Concern  . Not on file  Social History Narrative  . Not on file  Family History  Problem Relation Age of Onset  . COPD Mother        heavy smoker  . Pulmonary fibrosis Mother   . Fibromyalgia Mother   . COPD Father   . Melanoma Father   . COPD Sister   . COPD Brother   . COPD Sister   . COPD Sister   . Healthy Brother   . Heart failure Maternal Grandmother   . Heart attack Maternal Grandfather   . Cancer Paternal Grandmother        unsure of type, had mets in colon  . Heart attack Paternal Grandfather   . Cancer Paternal Grandfather        unknown  . Breast cancer Neg Hx   . Cervical cancer Neg Hx     Allergies  Allergen Reactions  . Sulfa Antibiotics Hives     Review of Systems   Review of Systems: Negative Unless Checked  Constitutional: [] Weight loss  [] Fever  [] Chills Cardiac: [] Chest pain   []  Atrial Fibrillation  [] Palpitations   [] Shortness of breath when laying flat   [] Shortness of breath with exertion. [] Shortness of breath at rest Vascular:  [] Pain in legs with walking   [] Pain in legs with standing [] Pain in legs when laying flat   [] Claudication    [] Pain in feet when laying flat    [x] History of DVT   [] Phlebitis   [x] Swelling in legs   [x] Varicose veins   [] Non-healing ulcers Pulmonary:   [] Uses home oxygen   [] Productive cough   [] Hemoptysis   [] Wheeze  [] COPD   [] Asthma Neurologic:  [] Dizziness   [] Seizures  [] Blackouts [] History of stroke   [] History of TIA  [] Aphasia   [] Temporary Blindness   [] Weakness or numbness in arm   [] Weakness or numbness in leg Musculoskeletal:   [] Joint swelling   [] Joint pain   [] Low back pain  []  History of Knee Replacement [] Arthritis [] back Surgeries  []  Spinal Stenosis    Hematologic:  [] Easy bruising  [] Easy bleeding   [] Hypercoagulable state   [] Anemic Gastrointestinal:  [] Diarrhea   [] Vomiting  [] Gastroesophageal reflux/heartburn   [] Difficulty swallowing. [] Abdominal pain Genitourinary:  [] Chronic kidney disease   [] Difficult urination  [] Anuric   [] Blood in urine [] Frequent urination  [] Burning with urination   [] Hematuria Skin:  [] Rashes   [] Ulcers [] Wounds Psychological:  [] History of anxiety   []  History of major depression  []  Memory Difficulties      OBJECTIVE:   Physical Exam  BP 113/75 (BP Location: Right Arm)   Pulse 76   Resp 16   Ht 5' 5"  (1.651 m)   Wt 234 lb (106.1 kg)   BMI 38.94 kg/m   Gen: WD/WN, NAD Head: [place]/AT, No temporalis wasting.  Ear/Nose/Throat: Hearing grossly intact, nares w/o erythema or drainage Eyes: PER, EOMI, sclera nonicteric.  Neck: Supple, no masses.  No JVD.  Pulmonary:  Good air movement, no use of accessory muscles.  Cardiac: RRR Vascular: 3+ hard edema right leg; erythematous; 3-5 mm varicose vein on left lower  extremity and scattred varicosities  Radial Palpable Palpable   Gastrointestinal: soft, non-distended. No guarding/no peritoneal signs.  Musculoskeletal: M/S 5/5 throughout.  No deformity or atrophy.  Neurologic: Pain and light touch intact in extremities.  Symmetrical.  Speech is fluent. Motor exam as listed above. Psychiatric: Judgment intact, Mood & affect appropriate for pt's clinical situation. Dermatologic: bilateral stasis dermatitis . No Ulcers Noted.  No changes consistent with cellulitis. Lymph : No Cervical lymphadenopathy,  no lichenification or skin changes of chronic lymphedema.       ASSESSMENT AND PLAN:  1. Acute deep vein thrombosis (DVT) of femoral vein of right lower extremity (HCC)  The patient had an incidental finding of a RLE DVT while being evaluated for reflux.   Recommend:   No surgery or intervention at this point in time.  IVC filter is not indicated at present.  Patient's duplex ultrasound of the venous system shows DVT from the popliteal to the femoral veins.  The patient is initiated on anticoagulation   Elevation was stressed, use of a recliner was discussed.  I have had a long discussion with the patient regarding DVT and post phlebitic changes such as swelling and why it  causes symptoms such as pain.  The patient will wear graduated compression stockings class 1 (20-30 mmHg), beginning after three full days of anticoagulation, on a daily basis a prescription was given. The patient will  beginning wearing the stockings first thing in the morning and removing them in the evening. The patient is instructed specifically not to sleep in the stockings.  In addition, behavioral modification including elevation during the day and avoidance of prolonged dependency will be initiated.      - Eliquis DVT/PE Starter Pack (ELIQUIS STARTER PACK) 5 MG TABS; Take as directed on package: start with two-27m tablets twice daily for 7 days. On day 8, switch to one-560m tablet twice daily.  Dispense: 1 each; Refill: 0 - apixaban (ELIQUIS) 5 MG TABS tablet; Take 1 tablet (5 mg total) by mouth 2 (two) times daily.  Dispense: 60 tablet; Refill: 3 - traMADol (ULTRAM) 50 MG tablet; Take 1 tablet (50 mg total) by mouth every 6 (six) hours as needed for up to 5 days.  Dispense: 20 tablet; Refill: 0 - VAS US[place]OWER EXTREMITY VENOUS (DVT); Future  2. Varicose veins of leg with pain, bilateral The patient has evidence of superficial reflux in the left GSV.  However, her incidental finding of DVT in the RLE takes precedent.  We will have her return in 3 months to evaluate the DVT and then possible proceed with endovenous laser ablation of the right lower extremity.    3. Gastroesophageal reflux disease, esophagitis presence not specified Continue PPI as already ordered, this medication has been reviewed and there are no changes at this time.  Avoidence of caffeine and alcohol  Moderate elevation of the head of the bed   4. Type 2 diabetes mellitus with microalbuminuria, without long-term current use of insulin (HCC) Continue hypoglycemic medications as already ordered, these medications have been reviewed and there are no changes at this time.  Hgb A1C to be monitored as already arranged by primary service    Current Outpatient Medications on File Prior to Visit  Medication Sig Dispense Refill  . ACCU-CHEK FASTCLIX LANCETS MISC Use as directed to check blood sugar 1-2 times daily 100 each 3  . dapagliflozin propanediol (FARXIGA) 10 MG TABS tablet Take 10 mg by mouth daily. 90 tablet 3  . ferrous sulfate 325 (65 FE) MG tablet Take by mouth.    . Marland Kitchenlucose blood (ACCU-CHEK GUIDE) test strip Use as instructed 100 each 3  . lisinopril (PRINIVIL,ZESTRIL) 5 MG tablet Take 1 tablet (5 mg total) by mouth daily. 90 tablet 3  . loratadine (CLARITIN) 10 MG tablet Take 1 tablet (10 mg total) by mouth daily. 90 tablet 3  . metFORMIN (GLUCOPHAGE) 1000 MG tablet Take 1 tablet  (1,000 mg total)  by mouth 2 (two) times daily with a meal. 180 tablet 3  . omeprazole (PRILOSEC) 20 MG capsule Take 1 capsule (20 mg total) by mouth daily. 90 capsule 3   No current facility-administered medications on file prior to visit.     There are no Patient Instructions on file for this visit. Return in about 3 months (around 11/26/2018) for DVT.   Kris Hartmann, NP  This note was completed with Sales executive.  Any errors are purely unintentional.

## 2018-09-01 ENCOUNTER — Telehealth (INDEPENDENT_AMBULATORY_CARE_PROVIDER_SITE_OTHER): Payer: Self-pay | Admitting: Nurse Practitioner

## 2018-09-01 NOTE — Telephone Encounter (Signed)
Patient should be able to take frequent breaks. She should not be doing extensive walking for at least a week.  She should be allowed to wear comfortable shoes such as sneakers.  Also she should be able to elevate her legs during breaks.  We have also received the paperwork from [employer] and we will be faxing by the requested deadline of 06/03

## 2018-09-01 NOTE — Telephone Encounter (Signed)
Left message for Seth Bake to call back for directions below. AS, CMA

## 2018-09-01 NOTE — Telephone Encounter (Signed)
Cindy Trujillo with [employer] calling asking if patient has anymore restrictions and what the light duty means. Please advise. AS< CMA   [phone] ext [phone]

## 2018-09-02 NOTE — Telephone Encounter (Signed)
Left message for Cindy Trujillo to return my call. AS, CMA

## 2018-10-10 ENCOUNTER — Ambulatory Visit: Payer: 59 | Admitting: Family Medicine

## 2018-10-10 ENCOUNTER — Other Ambulatory Visit: Payer: Self-pay

## 2018-10-10 ENCOUNTER — Encounter: Payer: Self-pay | Admitting: Family Medicine

## 2018-10-10 VITALS — BP 116/77 | HR 81 | Temp 98.3°F | Ht 65.0 in | Wt 232.0 lb

## 2018-10-10 DIAGNOSIS — B372 Candidiasis of skin and nail: Secondary | ICD-10-CM

## 2018-10-10 DIAGNOSIS — I82411 Acute embolism and thrombosis of right femoral vein: Secondary | ICD-10-CM

## 2018-10-10 DIAGNOSIS — R809 Proteinuria, unspecified: Secondary | ICD-10-CM | POA: Diagnosis not present

## 2018-10-10 DIAGNOSIS — E785 Hyperlipidemia, unspecified: Secondary | ICD-10-CM

## 2018-10-10 DIAGNOSIS — E1169 Type 2 diabetes mellitus with other specified complication: Secondary | ICD-10-CM | POA: Diagnosis not present

## 2018-10-10 DIAGNOSIS — E1129 Type 2 diabetes mellitus with other diabetic kidney complication: Secondary | ICD-10-CM

## 2018-10-10 DIAGNOSIS — Z0000 Encounter for general adult medical examination without abnormal findings: Secondary | ICD-10-CM | POA: Diagnosis not present

## 2018-10-10 MED ORDER — NYSTATIN 100000 UNIT/GM EX CREA: 1.0000 "application " | TOPICAL_CREAM | Freq: Two times a day (BID) | CUTANEOUS | 0 refills | Status: DC

## 2018-10-10 NOTE — Patient Instructions (Addendum)
 The CDC recommends two doses of Shingrix (the shingles vaccine) separated by 2 to 6 months for adults age 61 years and older. I recommend checking with your insurance plan regarding coverage for this vaccine.    Preventive Care 40-64 Years Old, Female Preventive care refers to visits with your health care provider and lifestyle choices that can promote health and wellness. This includes:  A yearly physical exam. This may also be called an annual well check.  Regular dental visits and eye exams.  Immunizations.  Screening for certain conditions.  Healthy lifestyle choices, such as eating a healthy diet, getting regular exercise, not using drugs or products that contain nicotine and tobacco, and limiting alcohol use. What can I expect for my preventive care visit? Physical exam Your health care provider will check your:  Height and weight. This may be used to calculate body mass index (BMI), which tells if you are at a healthy weight.  Heart rate and blood pressure.  Skin for abnormal spots. Counseling Your health care provider may ask you questions about your:  Alcohol, tobacco, and drug use.  Emotional well-being.  Home and relationship well-being.  Sexual activity.  Eating habits.  Work and work environment.  Method of birth control.  Menstrual cycle.  Pregnancy history. What immunizations do I need?  Influenza (flu) vaccine  This is recommended every year. Tetanus, diphtheria, and pertussis (Tdap) vaccine  You may need a Td booster every 10 years. Varicella (chickenpox) vaccine  You may need this if you have not been vaccinated. Zoster (shingles) vaccine  You may need this after age 60. Measles, mumps, and rubella (MMR) vaccine  You may need at least one dose of MMR if you were born in 1957 or later. You may also need a second dose. Pneumococcal conjugate (PCV13) vaccine  You may need this if you have certain conditions and were not previously  vaccinated. Pneumococcal polysaccharide (PPSV23) vaccine  You may need one or two doses if you smoke cigarettes or if you have certain conditions. Meningococcal conjugate (MenACWY) vaccine  You may need this if you have certain conditions. Hepatitis A vaccine  You may need this if you have certain conditions or if you travel or work in places where you may be exposed to hepatitis A. Hepatitis B vaccine  You may need this if you have certain conditions or if you travel or work in places where you may be exposed to hepatitis B. Haemophilus influenzae type b (Hib) vaccine  You may need this if you have certain conditions. Human papillomavirus (HPV) vaccine  If recommended by your health care provider, you may need three doses over 6 months. You may receive vaccines as individual doses or as more than one vaccine together in one shot (combination vaccines). Talk with your health care provider about the risks and benefits of combination vaccines. What tests do I need? Blood tests  Lipid and cholesterol levels. These may be checked every 5 years, or more frequently if you are over 61 years old.  Hepatitis C test.  Hepatitis B test. Screening  Lung cancer screening. You may have this screening every year starting at age 55 if you have a 30-pack-year history of smoking and currently smoke or have quit within the past 15 years.  Colorectal cancer screening. All adults should have this screening starting at age 61 and continuing until age 75. Your health care provider may recommend screening at age 45 if you are at increased risk. You will have   tests every 1-10 years, depending on your results and the type of screening test.  Diabetes screening. This is done by checking your blood sugar (glucose) after you have not eaten for a while (fasting). You may have this done every 1-3 years.  Mammogram. This may be done every 1-2 years. Talk with your health care provider about when you should start  having regular mammograms. This may depend on whether you have a family history of breast cancer.  BRCA-related cancer screening. This may be done if you have a family history of breast, ovarian, tubal, or peritoneal cancers.  Pelvic exam and Pap test. This may be done every 3 years starting at age 21. Starting at age 30, this may be done every 5 years if you have a Pap test in combination with an HPV test. Other tests  Sexually transmitted disease (STD) testing.  Bone density scan. This is done to screen for osteoporosis. You may have this scan if you are at high risk for osteoporosis. Follow these instructions at home: Eating and drinking  Eat a diet that includes fresh fruits and vegetables, whole grains, lean protein, and low-fat dairy.  Take vitamin and mineral supplements as recommended by your health care provider.  Do not drink alcohol if: ? Your health care provider tells you not to drink. ? You are pregnant, may be pregnant, or are planning to become pregnant.  If you drink alcohol: ? Limit how much you have to 0-1 drink a day. ? Be aware of how much alcohol is in your drink. In the [place] one drink equals one 12 oz bottle of beer (355 mL), one 5 oz glass of wine (148 mL), or one 1 oz glass of hard liquor (44 mL). Lifestyle  Take daily care of your teeth and gums.  Stay active. Exercise for at least 30 minutes on 5 or more days each week.  Do not use any products that contain nicotine or tobacco, such as cigarettes, e-cigarettes, and chewing tobacco. If you need help quitting, ask your health care provider.  If you are sexually active, practice safe sex. Use a condom or other form of birth control (contraception) in order to prevent pregnancy and STIs (sexually transmitted infections).  If told by your health care provider, take low-dose aspirin daily starting at age 61. What's next?  Visit your health care provider once a year for a well check visit.  Ask your health  care provider how often you should have your eyes and teeth checked.  Stay up to date on all vaccines. This information is not intended to replace advice given to you by your health care provider. Make sure you discuss any questions you have with your health care provider. Document Released: 04/26/2015 Document Revised: 12/09/2017 Document Reviewed: 12/09/2017 Elsevier Patient Education  2020 [employer]  

## 2018-10-10 NOTE — Progress Notes (Signed)
Patient: Cindy Trujillo, Female    DOB: 09/08/1955, 63 y.o.   MRN: [id number] Visit Date: 10/12/2018  Today's Provider: Lavon Paganini, MD   Chief Complaint  Patient presents with  . Annual Exam   Subjective:     Annual physical exam Cindy Trujillo is a 63 y.o. female who presents today for health maintenance and complete physical. She feels well. She reports exercising doing yard work. She reports she is sleeping well.  Pt never had mammogram.  States that she will not until a more comfortable way of screening is developed. -----------------------------------------------------------------   Review of Systems  Constitutional: Positive for appetite change.  HENT: Negative.   Eyes: Negative.   Respiratory: Negative.   Cardiovascular: Positive for leg swelling.  Gastrointestinal: Negative.   Endocrine: Negative.   Genitourinary: Negative.   Musculoskeletal: Negative.   Skin: Negative.   Allergic/Immunologic: Negative.   Neurological: Negative.   Hematological: Negative.   Psychiatric/Behavioral: Negative.     Social History      She  reports that she has never smoked. She has never used smokeless tobacco. She reports that she does not drink alcohol or use drugs.       Social History   Socioeconomic History  . Marital status: Married    Spouse name: Francee Piccolo  . Number of children: 0  . Years of education: some college  . Highest education level: Not on file  Occupational History  . Occupation: CARE MANGEMENT    Employer: [employer]  Social Needs  . Financial resource strain: Not on file  . Food insecurity    Worry: Not on file    Inability: Not on file  . Transportation needs    Medical: Not on file    Non-medical: Not on file  Tobacco Use  . Smoking status: Never Smoker  . Smokeless tobacco: Never Used  Substance and Sexual Activity  . Alcohol use: No    Alcohol/week: 0.0 standard drinks  . Drug use: No  . Sexual activity: Not  Currently  Lifestyle  . Physical activity    Days per week: Not on file    Minutes per session: Not on file  . Stress: Not on file  Relationships  . Social Herbalist on phone: Not on file    Gets together: Not on file    Attends religious service: Not on file    Active member of club or organization: Not on file    Attends meetings of clubs or organizations: Not on file    Relationship status: Not on file  Other Topics Concern  . Not on file  Social History Narrative  . Not on file    Past Medical History:  Diagnosis Date  . Diabetes mellitus without complication ([place])   . GERD (gastroesophageal reflux disease)   . Hyperlipidemia      Patient Active Problem List   Diagnosis Date Noted  . Acute deep vein thrombosis (DVT) of femoral vein of right lower extremity ([place]) 10/12/2018  . Microalbuminuria 06/23/2018  . Varicose veins of leg with pain, bilateral 05/20/2018  . Varicose veins of both lower extremities 08/27/2017  . T2DM (type 2 diabetes mellitus) ([place])   . Hyperlipidemia associated with type 2 diabetes mellitus ([place])   . Allergic rhinitis 12/11/2016  . GERD (gastroesophageal reflux disease) 12/11/2016  . Morbid obesity ([place]) 12/11/2016    Past Surgical History:  Procedure Laterality Date  . COLONOSCOPY WITH  PROPOFOL N/A 08/23/2017   Procedure: COLONOSCOPY WITH PROPOFOL;  Surgeon: Manya Silvas, MD;  Location[facility] ENDOSCOPY;  Service: Endoscopy;  Laterality: N/A;  . TONSILLECTOMY AND ADENOIDECTOMY  1968    Family History        Family Status  Relation Name Status  . Mother  Deceased       complications from lung disease  . Father  Alive  . Sister  Alive  . Brother  Alive  . Sister  Alive  . Sister  Alive  . Brother  Alive  . MGM  (Not Specified)  . MGF  (Not Specified)  . PGM  (Not Specified)  . PGF  (Not Specified)  . Neg Hx  (Not Specified)        Her family history includes COPD in her brother, father, mother, sister, sister, and  sister; Cancer in her paternal grandfather and paternal grandmother; Fibromyalgia in her mother; Healthy in her brother; Heart attack in her maternal grandfather and paternal grandfather; Heart failure in her maternal grandmother; Melanoma in her father; Pulmonary fibrosis in her mother. There is no history of Breast cancer or Cervical cancer.      Allergies  Allergen Reactions  . Sulfa Antibiotics Hives     Current Outpatient Medications:  .  ACCU-CHEK FASTCLIX LANCETS MISC, Use as directed to check blood sugar 1-2 times daily, Disp: 100 each, Rfl: 3 .  apixaban (ELIQUIS) 5 MG TABS tablet, Take 1 tablet (5 mg total) by mouth 2 (two) times daily., Disp: 60 tablet, Rfl: 3 .  dapagliflozin propanediol (FARXIGA) 10 MG TABS tablet, Take 10 mg by mouth daily., Disp: 90 tablet, Rfl: 3 .  Eliquis DVT/PE Starter Pack (ELIQUIS STARTER PACK) 5 MG TABS, Take as directed on package: start with two-63m tablets twice daily for 7 days. On day 8, switch to one-517mtablet twice daily., Disp: 1 each, Rfl: 0 .  ferrous sulfate 325 (65 FE) MG tablet, Take by mouth., Disp: , Rfl:  .  glucose blood (ACCU-CHEK GUIDE) test strip, Use as instructed, Disp: 100 each, Rfl: 3 .  lisinopril (PRINIVIL,ZESTRIL) 5 MG tablet, Take 1 tablet (5 mg total) by mouth daily., Disp: 90 tablet, Rfl: 3 .  loratadine (CLARITIN) 10 MG tablet, Take 1 tablet (10 mg total) by mouth daily., Disp: 90 tablet, Rfl: 3 .  metFORMIN (GLUCOPHAGE) 1000 MG tablet, Take 1 tablet (1,000 mg total) by mouth 2 (two) times daily with a meal., Disp: 180 tablet, Rfl: 3 .  omeprazole (PRILOSEC) 20 MG capsule, Take 1 capsule (20 mg total) by mouth daily., Disp: 90 capsule, Rfl: 3 .  nystatin cream (MYCOSTATIN), Apply 1 application topically 2 (two) times daily., Disp: 30 g, Rfl: 0   Patient Care Team: BaVirginia CrewsMD as PCP - General (Family Medicine)    Objective:    Vitals: BP 116/77 (BP Location: Right Arm, Patient Position: Sitting, Cuff Size:  Large)   Pulse 81   Temp 98.3 F (36.8 C) (Oral)   Ht 5' 5"  (1.651 m)   Wt 232 lb (105.2 kg)   SpO2 99%   BMI 38.61 kg/m    Vitals:   10/10/18 1104  BP: 116/77  Pulse: 81  Temp: 98.3 F (36.8 C)  TempSrc: Oral  SpO2: 99%  Weight: 232 lb (105.2 kg)  Height: 5' 5"  (1.651 m)     Physical Exam Vitals signs reviewed.  Constitutional:      General: She is not in acute distress.  Appearance: Normal appearance. She is well-developed. She is not diaphoretic.  HENT:     Head: Normocephalic and atraumatic.     Right Ear: External ear normal.     Left Ear: External ear normal.     Nose: Nose normal.     Mouth/Throat:     Mouth: Mucous membranes are moist.     Pharynx: Oropharynx is clear. No oropharyngeal exudate.  Eyes:     General: No scleral icterus.    Conjunctiva/sclera: Conjunctivae normal.     Pupils: Pupils are equal, round, and reactive to light.  Neck:     Musculoskeletal: Neck supple.     Thyroid: No thyromegaly.  Cardiovascular:     Rate and Rhythm: Normal rate and regular rhythm.     Heart sounds: Normal heart sounds. No murmur.  Pulmonary:     Effort: Pulmonary effort is normal. No respiratory distress.     Breath sounds: Normal breath sounds. No wheezing or rales.  Abdominal:     General: Bowel sounds are normal. There is no distension.     Palpations: Abdomen is soft.     Tenderness: There is no abdominal tenderness. There is no guarding or rebound.  Genitourinary:    Comments: Breasts: breasts appear normal, no suspicious masses, no skin or nipple changes or axillary nodes.  Musculoskeletal:        General: No deformity.     Right lower leg: Edema (2+) present.     Left lower leg: No edema.  Lymphadenopathy:     Cervical: No cervical adenopathy.  Skin:    General: Skin is warm and dry.     Capillary Refill: Capillary refill takes less than 2 seconds.     Comments: Slight intertrigo noted under breasts  Neurological:     Mental Status: She is  alert and oriented to person, place, and time.  Psychiatric:        Mood and Affect: Mood normal.        Behavior: Behavior normal.        Thought Content: Thought content normal.      Depression Screen PHQ 2/9 Scores 10/10/2018 02/25/2017 01/15/2017  PHQ - 2 Score 0 0 0  PHQ- 9 Score 1 - -       Assessment & Plan:     Routine Health Maintenance and Physical Exam  Exercise Activities and Dietary recommendations Goals   None     Immunization History  Administered Date(s) Administered  . Influenza,inj,Quad PF,6+ Mos 01/05/2017  . Pneumococcal Polysaccharide-23 03/08/2017  . Tdap 03/08/2017    Health Maintenance  Topic Date Due  . MAMMOGRAM  12/29/2007  . INFLUENZA VACCINE  11/12/2018  . HEMOGLOBIN A1C  12/24/2018  . OPHTHALMOLOGY EXAM  04/13/2019  . FOOT EXAM  06/23/2019  . PAP SMEAR-Modifier  01/16/2020  . TETANUS/TDAP  03/09/2027  . COLONOSCOPY  08/24/2027  . PNEUMOCOCCAL POLYSACCHARIDE VACCINE AGE 23-64 HIGH RISK  Completed  . Hepatitis C Screening  Completed  . HIV Screening  Completed     Discussed health benefits of physical activity, and encouraged her to engage in regular exercise appropriate for her age and condition.    --------------------------------------------------------------------  Problem List Items Addressed This Visit      Cardiovascular and Mediastinum   Acute deep vein thrombosis (DVT) of femoral vein of right lower extremity (HCC)    Followed by vascular surgery Found incidentally on imaging for varicose veins Unprovoked, so could consider hypercoagulable work-up after completing Eliquis course Scheduled  for follow-up in 2 months with vascular surgery Doing well on Eliquis, which will be continued Advised compression and elevation to decrease swelling of right lower extremity      Relevant Orders   CBC w/Diff/Platelet     Endocrine   T2DM (type 2 diabetes mellitus) ([place])   Relevant Orders   Comprehensive metabolic panel    Hemoglobin A1c   Hyperlipidemia associated with type 2 diabetes mellitus ([place])   Relevant Orders   Comprehensive metabolic panel   Lipid panel     Other   Morbid obesity ([place])   Relevant Orders   CBC w/Diff/Platelet   Microalbuminuria    Other Visit Diagnoses    Encounter for annual physical exam    -  Primary   Relevant Orders   CBC w/Diff/Platelet   Comprehensive metabolic panel   Lipid panel   Hemoglobin A1c   Candidal intertrigo       Relevant Medications   nystatin cream (MYCOSTATIN)    -Noted incidentally on exam - Nystatin cream until resolution   Return in about 3 months (around 01/10/2019) for chronic disease f/u.   The entirety of the information documented in the History of Present Illness, Review of Systems and Physical Exam were personally obtained by me. Portions of this information were initially documented by Tiburcio Pea, CMA and reviewed by me for thoroughness and accuracy.    Arthelia Callicott, Dionne Bucy, MD MPH [facility]

## 2018-10-12 DIAGNOSIS — Z0000 Encounter for general adult medical examination without abnormal findings: Secondary | ICD-10-CM | POA: Diagnosis not present

## 2018-10-12 DIAGNOSIS — R809 Proteinuria, unspecified: Secondary | ICD-10-CM | POA: Diagnosis not present

## 2018-10-12 DIAGNOSIS — I82411 Acute embolism and thrombosis of right femoral vein: Secondary | ICD-10-CM | POA: Diagnosis not present

## 2018-10-12 DIAGNOSIS — E785 Hyperlipidemia, unspecified: Secondary | ICD-10-CM | POA: Diagnosis not present

## 2018-10-12 DIAGNOSIS — E1129 Type 2 diabetes mellitus with other diabetic kidney complication: Secondary | ICD-10-CM | POA: Diagnosis not present

## 2018-10-12 DIAGNOSIS — E1169 Type 2 diabetes mellitus with other specified complication: Secondary | ICD-10-CM | POA: Diagnosis not present

## 2018-10-12 NOTE — Assessment & Plan Note (Signed)
Followed by vascular surgery Found incidentally on imaging for varicose veins Unprovoked, so could consider hypercoagulable work-up after completing Eliquis course Scheduled for follow-up in 2 months with vascular surgery Doing well on Eliquis, which will be continued Advised compression and elevation to decrease swelling of right lower extremity

## 2018-10-13 LAB — LIPID PANEL
Chol/HDL Ratio: 4.6 ratio — ABNORMAL HIGH (ref 0.0–4.4)
Cholesterol, Total: 214 mg/dL — ABNORMAL HIGH (ref 100–199)
HDL: 47 mg/dL (ref >39)
LDL Calculated: 110 mg/dL — ABNORMAL HIGH (ref 0–99)
Triglycerides: 287 mg/dL — ABNORMAL HIGH (ref 0–149)
VLDL Cholesterol Cal: 57 mg/dL — ABNORMAL HIGH (ref 5–40)

## 2018-10-13 LAB — CBC WITH DIFFERENTIAL/PLATELET
Basophils Absolute: 0 10*3/uL (ref 0.0–0.2)
Basos: 1 %
EOS (ABSOLUTE): 0.2 10*3/uL (ref 0.0–0.4)
Eos: 3 %
Hematocrit: 35.3 % (ref 34.0–46.6)
Hemoglobin: 11.8 g/dL (ref 11.1–15.9)
Immature Grans (Abs): 0 10*3/uL (ref 0.0–0.1)
Immature Granulocytes: 0 %
Lymphocytes Absolute: 1.7 10*3/uL (ref 0.7–3.1)
Lymphs: 35 %
MCH: 26.5 pg — ABNORMAL LOW (ref 26.6–33.0)
MCHC: 33.4 g/dL (ref 31.5–35.7)
MCV: 79 fL (ref 79–97)
Monocytes Absolute: 0.3 10*3/uL (ref 0.1–0.9)
Monocytes: 6 %
Neutrophils Absolute: 2.6 10*3/uL (ref 1.4–7.0)
Neutrophils: 55 %
Platelets: 102 10*3/uL — ABNORMAL LOW (ref 150–450)
RBC: 4.45 x10E6/uL (ref 3.77–5.28)
RDW: 14.5 % (ref 11.7–15.4)
WBC: 4.8 10*3/uL (ref 3.4–10.8)

## 2018-10-13 LAB — HEMOGLOBIN A1C
Est. average glucose Bld gHb Est-mCnc: 146 mg/dL
Hgb A1c MFr Bld: 6.7 % — ABNORMAL HIGH (ref 4.8–5.6)

## 2018-10-13 LAB — COMPREHENSIVE METABOLIC PANEL
ALT: 29 IU/L (ref 0–32)
AST: 44 IU/L — ABNORMAL HIGH (ref 0–40)
Albumin/Globulin Ratio: 1.5 (ref 1.2–2.2)
Albumin: 4.3 g/dL (ref 3.8–4.9)
Alkaline Phosphatase: 100 IU/L (ref 39–117)
BUN/Creatinine Ratio: 15 (ref 12–28)
BUN: 16 mg/dL (ref 8–27)
Bilirubin Total: 0.7 mg/dL (ref 0.0–1.2)
CO2: 18 mmol/L — ABNORMAL LOW (ref 20–29)
Calcium: 9.3 mg/dL (ref 8.7–10.3)
Chloride: 108 mmol/L — ABNORMAL HIGH (ref 96–106)
Creatinine, Ser: 1.06 mg/dL — ABNORMAL HIGH (ref 0.57–1.00)
GFR calc Af Amer: 66 mL/min/{1.73_m2} (ref >59)
GFR calc non Af Amer: 57 mL/min/{1.73_m2} — ABNORMAL LOW (ref >59)
Globulin, Total: 2.8 g/dL (ref 1.5–4.5)
Glucose: 108 mg/dL — ABNORMAL HIGH (ref 65–99)
Potassium: 4.3 mmol/L (ref 3.5–5.2)
Sodium: 142 mmol/L (ref 134–144)
Total Protein: 7.1 g/dL (ref 6.0–8.5)

## 2018-10-17 ENCOUNTER — Telehealth: Payer: Self-pay

## 2018-10-17 DIAGNOSIS — D696 Thrombocytopenia, unspecified: Secondary | ICD-10-CM

## 2018-10-17 DIAGNOSIS — R7989 Other specified abnormal findings of blood chemistry: Secondary | ICD-10-CM

## 2018-10-17 NOTE — Telephone Encounter (Signed)
-----   Message from Virginia Crews, MD sent at 10/13/2018  4:11 PM EDT ----- Blood counts are normal, except platelet count continues to downtrend.  This can be a rare side effect from the Eliquis, but had already started decreasing prior to starting the Eliquis.  I recommend a hematology referral for further work-up.  Can go ahead and place the referral if patient agrees (diagnosis thrombocytopenia).  They would also be the ones who could do the further work-up for possible causes of the unprovoked DVT.    Kidney function is also slightly worse than it was 6 months ago.  I wonder if the patient is taking any NSAIDs, and if she is she should hold the use.  She should also try to hydrate well.  I recommend rechecking her labs in 2 weeks to see if this is improving.    Cholesterol has also worsened over the last 6 months.  Bad cholesterol, LDL, is now up from 87-1 10.  Our goal for LDL is less than 70 in diabetics.  It is also recommended that most diabetics take a statin for heart protection regardless of therapy cholesterol level.  I would recommend that she start a statin.  If she agrees, we can go ahead and prescribe atorvastatin 20 mg daily 30-day supply with 2 refills.  I would then recheck her cholesterol and liver function in about 3 months.  I also recommend regular exercise-30 minutes a daily 5 times a week-and diet low in saturated fat.  A1c, on the other hand, has decreased and is now well controlled at 6.7.  No changes to diabetic medications.  Keep up the good work with low-carb diet.

## 2018-10-17 NOTE — Telephone Encounter (Signed)
LVMTRC 

## 2018-10-20 NOTE — Telephone Encounter (Signed)
Left message to call back  

## 2018-10-20 NOTE — Telephone Encounter (Signed)
Patient returning your call.

## 2018-10-21 MED ORDER — ATORVASTATIN CALCIUM 40 MG PO TABS: 40.0000 mg | ORAL_TABLET | Freq: Every day | ORAL | 3 refills | Status: DC

## 2018-10-21 NOTE — Telephone Encounter (Signed)
Atorvastatin sent to pharmacy Labs ordered - can be left up front to repeat in 2 weeks

## 2018-10-21 NOTE — Telephone Encounter (Signed)
Patient was advised via vm per DRP

## 2018-10-21 NOTE — Telephone Encounter (Addendum)
Patient advised of labs. She would like to recheck her labs again before the referral to Hematology and to recheck the Renal panel. Patient also agrees to the Atorvastatin, if this can be send to her pharmacy please.  Pharmacy: [facility]

## 2018-11-29 ENCOUNTER — Ambulatory Visit (INDEPENDENT_AMBULATORY_CARE_PROVIDER_SITE_OTHER): Payer: 59

## 2018-11-29 ENCOUNTER — Ambulatory Visit (INDEPENDENT_AMBULATORY_CARE_PROVIDER_SITE_OTHER): Payer: 59 | Admitting: Vascular Surgery

## 2018-11-29 ENCOUNTER — Other Ambulatory Visit: Payer: Self-pay

## 2018-11-29 ENCOUNTER — Encounter (INDEPENDENT_AMBULATORY_CARE_PROVIDER_SITE_OTHER): Payer: Self-pay | Admitting: Vascular Surgery

## 2018-11-29 VITALS — BP 111/72 | HR 81 | Resp 16 | Wt 232.0 lb

## 2018-11-29 DIAGNOSIS — E1169 Type 2 diabetes mellitus with other specified complication: Secondary | ICD-10-CM | POA: Diagnosis not present

## 2018-11-29 DIAGNOSIS — E785 Hyperlipidemia, unspecified: Secondary | ICD-10-CM | POA: Diagnosis not present

## 2018-11-29 DIAGNOSIS — I82411 Acute embolism and thrombosis of right femoral vein: Secondary | ICD-10-CM

## 2018-11-29 DIAGNOSIS — I82531 Chronic embolism and thrombosis of right popliteal vein: Secondary | ICD-10-CM

## 2018-11-29 DIAGNOSIS — E1129 Type 2 diabetes mellitus with other diabetic kidney complication: Secondary | ICD-10-CM | POA: Diagnosis not present

## 2018-11-29 DIAGNOSIS — I82409 Acute embolism and thrombosis of unspecified deep veins of unspecified lower extremity: Secondary | ICD-10-CM | POA: Insufficient documentation

## 2018-11-29 DIAGNOSIS — I83813 Varicose veins of bilateral lower extremities with pain: Secondary | ICD-10-CM | POA: Diagnosis not present

## 2018-11-29 DIAGNOSIS — R809 Proteinuria, unspecified: Secondary | ICD-10-CM

## 2018-11-29 NOTE — Assessment & Plan Note (Signed)
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  She still needs three more months of anticoagulation and we will recheck her at that time.

## 2018-11-29 NOTE — Assessment & Plan Note (Signed)
blood glucose control important in reducing the progression of atherosclerotic disease. Also, involved in wound healing. On appropriate medications.  

## 2018-11-29 NOTE — Assessment & Plan Note (Signed)
Reflux study in three months.  No intervention until she finishes treatment for DVT of the right leg.

## 2018-11-29 NOTE — Progress Notes (Signed)
MRN : [id number]  Cindy Trujillo is a 63 y.o. ([birth year]) female who presents with chief complaint of  Chief Complaint  Patient presents with  . Follow-up    ultrasound follow up  .  History of Present Illness: Patient returns today in follow up of her venous insufficiency and DVT.  She has been on anticoagulation for three months for a right leg DVT.  This is better in terms of pain and swelling, but not entirely resolved. 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   Current Outpatient Medications  Medication Sig Dispense Refill  . ACCU-CHEK FASTCLIX LANCETS MISC Use as directed to check blood sugar 1-2 times daily 100 each 3  . apixaban (ELIQUIS) 5 MG TABS tablet Take 1 tablet (5 mg total) by mouth 2 (two) times daily. 60 tablet 3  . atorvastatin (LIPITOR) 40 MG tablet Take 1 tablet (40 mg total) by mouth daily. 30 tablet 3  . dapagliflozin propanediol (FARXIGA) 10 MG TABS tablet Take 10 mg by mouth daily. 90 tablet 3  . ferrous sulfate 325 (65 FE) MG tablet Take by mouth.    Marland Kitchen glucose blood (ACCU-CHEK GUIDE) test strip Use as instructed 100 each 3  . lisinopril (PRINIVIL,ZESTRIL) 5 MG tablet Take 1 tablet (5 mg total) by mouth daily. 90 tablet 3  . loratadine (CLARITIN) 10 MG tablet Take 1 tablet (10 mg total) by mouth daily. 90 tablet 3  . metFORMIN (GLUCOPHAGE) 1000 MG tablet Take 1 tablet (1,000 mg total) by mouth 2 (two) times daily with a meal. 180 tablet 3  . omeprazole (PRILOSEC) 20 MG capsule Take 1 capsule (20 mg total) by mouth daily. 90 capsule 3  . Eliquis DVT/PE Starter Pack (ELIQUIS STARTER PACK) 5 MG TABS Take as directed on package: start with two-48m tablets twice daily for 7 days. On day 8, switch to one-556mtablet twice daily. (Patient not taking: Reported on 11/29/2018) 1 each 0  . nystatin cream (MYCOSTATIN) Apply 1 application topically 2 (two) times daily. 30 g 0   No current  facility-administered medications for this visit.     Past Medical History:  Diagnosis Date  . [place] complication ([place]  . GERD (gastroesophageal reflux disease)   . Hyperlipidemia     Past Surgical History:  Procedure Laterality Date  . COLONOSCOPY WITH PROPOFOL N/A 08/23/2017   Procedure: COLONOSCOPY WITH PROPOFOL;  Surgeon: ElManya SilvasMD;  [facility];  Service: Endoscopy;  Laterality: N/A;  . TONSILLECTOMY AND ADENOIDECTOMY  1968    Social History Social History   Tobacco Use  . Smoking status: Never Smoker  . Smokeless tobacco: Never Used  Substance Use Topics  . Alcohol use: No    Alcohol/week: 0.0 standard drinks  . Drug use: No     Family History Family History  Problem Relation Age of Onset  . COPD Mother        heavy smoker  . Pulmonary fibrosis Mother   . Fibromyalgia Mother   . COPD Father   . Melanoma Father   . COPD Sister   . COPD Brother   . COPD Sister   . COPD Sister   . Healthy Brother   . Heart failure Maternal Grandmother   . Heart attack Maternal Grandfather   . Cancer Paternal Grandmother        unsure of type, had mets in colon  . Heart  attack Paternal Grandfather   . Cancer Paternal Grandfather        unknown  . Breast cancer Neg Hx   . Cervical cancer Neg Hx      Allergies  Allergen Reactions  . Sulfa Antibiotics Hives     REVIEW OF SYSTEMS (Negative unless checked)  Constitutional: [] Weight loss  [] Fever  [] Chills Cardiac: [] Chest pain   [] Chest pressure   [] Palpitations   [] Shortness of breath when laying flat   [] Shortness of breath at rest   [] Shortness of breath with exertion. Vascular:  [x] Pain in legs with walking   [x] Pain in legs at rest   [] Pain in legs when laying flat   [] Claudication   [] Pain in feet when walking  [] Pain in feet at rest  [] Pain in feet when laying flat   [] History of DVT   [x] Phlebitis   [x] Swelling in legs   [] Varicose veins   [] Non-healing ulcers  Pulmonary:   [] Uses home oxygen   [] Productive cough   [] Hemoptysis   [] Wheeze  [] COPD   [] Asthma Neurologic:  [] Dizziness  [] Blackouts   [] Seizures   [] History of stroke   [] History of TIA  [] Aphasia   [] Temporary blindness   [] Dysphagia   [] Weakness or numbness in arms   [] Weakness or numbness in legs Musculoskeletal:  [x] Arthritis   [] Joint swelling   [] Joint pain   [] Low back pain Hematologic:  [] Easy bruising  [] Easy bleeding   [] Hypercoagulable state   [] Anemic   Gastrointestinal:  [] Blood in stool   [] Vomiting blood  [] Gastroesophageal reflux/heartburn   [] Abdominal pain Genitourinary:  [] Chronic kidney disease   [] Difficult urination  [] Frequent urination  [] Burning with urination   [] Hematuria Skin:  [] Rashes   [] Ulcers   [] Wounds Psychological:  [] History of anxiety   []  History of major depression.  Physical Examination  BP 111/72 (BP Location: Right Arm)   Pulse 81   Resp 16   Wt 232 lb (105.2 kg)   BMI 38.61 kg/m  Gen:  WD/WN, NAD Head: [place]/AT, No temporalis wasting. Ear/Nose/Throat: Hearing grossly intact, nares w/o erythema or drainage Eyes: Conjunctiva clear. Sclera non-icteric Neck: Supple.  Trachea midline Pulmonary:  Good air movement, no use of accessory muscles.  Cardiac: RRR, no JVD Vascular:  Vessel Right Left  Radial Palpable Palpable                          PT Palpable Palpable  DP Palpable Palpable   Gastrointestinal: soft, non-tender/non-distended. No guarding/reflex.  Musculoskeletal: M/S 5/5 throughout.  No deformity or atrophy. 1+ BLE edema. Diffuse/extensive varicosities of both legs, worse on the left. Neurologic: Sensation grossly intact in extremities.  Symmetrical.  Speech is fluent.  Psychiatric: Judgment intact, Mood & affect appropriate for pt's clinical situation. Dermatologic: No rashes or ulcers noted.  No cellulitis or open wounds.       Labs Recent Results (from the past 2160 hour(s))  CBC w/Diff/Platelet     Status:  Abnormal   Collection Time: 10/12/18 10:47 AM  Result Value Ref Range   WBC 4.8 3.4 - 10.8 x10E3/uL   RBC 4.45 3.77 - 5.28 x10E6/uL   Hemoglobin 11.8 11.1 - 15.9 g/dL   Hematocrit 35.3 34.0 - 46.6 %   MCV 79 79 - 97 fL   MCH 26.5 (L) 26.6 - 33.0 pg   MCHC 33.4 31.5 - 35.7 g/dL   RDW 14.5 11.7 - 15.4 %   Platelets 102 (L) 150 - 450  x10E3/uL   Neutrophils 55 Not Estab. %   Lymphs 35 Not Estab. %   Monocytes 6 Not Estab. %   Eos 3 Not Estab. %   Basos 1 Not Estab. %   Neutrophils Absolute 2.6 1.4 - 7.0 x10E3/uL   Lymphocytes Absolute 1.7 0.7 - 3.1 x10E3/uL   Monocytes Absolute 0.3 0.1 - 0.9 x10E3/uL   EOS (ABSOLUTE) 0.2 0.0 - 0.4 x10E3/uL   Basophils Absolute 0.0 0.0 - 0.2 x10E3/uL   Immature Granulocytes 0 Not Estab. %   Immature Grans (Abs) 0.0 0.0 - 0.1 x10E3/uL  Comprehensive metabolic panel     Status: Abnormal   Collection Time: 10/12/18 10:47 AM  Result Value Ref Range   Glucose 108 (H) 65 - 99 mg/dL   BUN 16 8 - 27 mg/dL   Creatinine, Ser 1.06 (H) 0.57 - 1.00 mg/dL   GFR calc non Af Amer 57 (L) >59 mL/min/1.73   GFR calc Af Amer 66 >59 mL/min/1.73   BUN/Creatinine Ratio 15 12 - 28   Sodium 142 134 - 144 mmol/L   Potassium 4.3 3.5 - 5.2 mmol/L   Chloride 108 (H) 96 - 106 mmol/L   CO2 18 (L) 20 - 29 mmol/L   Calcium 9.3 8.7 - 10.3 mg/dL   Total Protein 7.1 6.0 - 8.5 g/dL   Albumin 4.3 3.8 - 4.9 g/dL   Globulin, Total 2.8 1.5 - 4.5 g/dL   Albumin/Globulin Ratio 1.5 1.2 - 2.2   Bilirubin Total 0.7 0.0 - 1.2 mg/dL   Alkaline Phosphatase 100 39 - 117 IU/L   AST 44 (H) 0 - 40 IU/L   ALT 29 0 - 32 IU/L  Lipid panel     Status: Abnormal   Collection Time: 10/12/18 10:47 AM  Result Value Ref Range   Cholesterol, Total 214 (H) 100 - 199 mg/dL   Triglycerides 287 (H) 0 - 149 mg/dL   HDL 47 >39 mg/dL   VLDL Cholesterol Cal 57 (H) 5 - 40 mg/dL   LDL Calculated 110 (H) 0 - 99 mg/dL   Chol/HDL Ratio 4.6 (H) 0.0 - 4.4 ratio    Comment:                                   T.  Chol/HDL Ratio                                             Men  Women                               1/2 Avg.Risk  3.4    3.3                                   Avg.Risk  5.0    4.4                                2X Avg.Risk  9.6    7.1  3X Avg.Risk 23.4   11.0   Hemoglobin A1c     Status: Abnormal   Collection Time: 10/12/18 10:47 AM  Result Value Ref Range   Hgb A1c MFr Bld 6.7 (H) 4.8 - 5.6 %    Comment:          Prediabetes: 5.7 - 6.4          Diabetes: >6.4          Glycemic control for adults with diabetes: <7.0    Est. average glucose Bld gHb Est-mCnc 146 mg/dL    Radiology No results found.  Assessment/Plan  T2DM (type 2 diabetes mellitus) (HCC) blood glucose control important in reducing the progression of atherosclerotic disease. Also, involved in wound healing. On appropriate medications.   Hyperlipidemia associated with type 2 diabetes mellitus (HCC) lipid control important in reducing the progression of atherosclerotic disease. Continue statin therapy   Varicose veins of leg with pain, bilateral Reflux study in three months.  No intervention until she finishes treatment for DVT of the right leg.  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  She still needs three more months of anticoagulation and we will recheck her at that time.     Leotis Pain, MD  11/29/2018 4:42 PM    This note was created with Dragon medical transcription system.  Any errors from dictation are purely unintentional

## 2018-11-29 NOTE — Assessment & Plan Note (Signed)
lipid control important in reducing the progression of atherosclerotic disease. Continue statin therapy  

## 2019-01-11 ENCOUNTER — Ambulatory Visit: Payer: 59 | Admitting: Family Medicine

## 2019-02-13 ENCOUNTER — Ambulatory Visit: Payer: Self-pay | Admitting: Family Medicine

## 2019-02-27 ENCOUNTER — Other Ambulatory Visit: Payer: Self-pay

## 2019-02-27 ENCOUNTER — Other Ambulatory Visit: Payer: Self-pay | Admitting: Family Medicine

## 2019-02-27 ENCOUNTER — Encounter: Payer: Self-pay | Admitting: Family Medicine

## 2019-02-27 ENCOUNTER — Ambulatory Visit: Payer: 59 | Admitting: Family Medicine

## 2019-02-27 VITALS — BP 106/69 | HR 76 | Temp 97.3°F | Resp 16 | Ht 65.0 in | Wt 228.0 lb

## 2019-02-27 DIAGNOSIS — E1169 Type 2 diabetes mellitus with other specified complication: Secondary | ICD-10-CM

## 2019-02-27 DIAGNOSIS — I82411 Acute embolism and thrombosis of right femoral vein: Secondary | ICD-10-CM

## 2019-02-27 DIAGNOSIS — E1129 Type 2 diabetes mellitus with other diabetic kidney complication: Secondary | ICD-10-CM | POA: Diagnosis not present

## 2019-02-27 DIAGNOSIS — R809 Proteinuria, unspecified: Secondary | ICD-10-CM

## 2019-02-27 DIAGNOSIS — R7989 Other specified abnormal findings of blood chemistry: Secondary | ICD-10-CM | POA: Diagnosis not present

## 2019-02-27 DIAGNOSIS — E785 Hyperlipidemia, unspecified: Secondary | ICD-10-CM | POA: Diagnosis not present

## 2019-02-27 DIAGNOSIS — D696 Thrombocytopenia, unspecified: Secondary | ICD-10-CM | POA: Diagnosis not present

## 2019-02-27 MED ORDER — APIXABAN 5 MG PO TABS: 5.0000 mg | ORAL_TABLET | Freq: Two times a day (BID) | ORAL | 3 refills | Status: DC

## 2019-02-27 MED ORDER — ACCU-CHEK GUIDE VI STRP: ORAL_STRIP | 3 refills | Status: DC

## 2019-02-27 MED ORDER — ROSUVASTATIN CALCIUM 5 MG PO TABS: 5.0000 mg | ORAL_TABLET | Freq: Every day | ORAL | 3 refills | Status: DC

## 2019-02-27 MED ORDER — LORATADINE 10 MG PO TABS: 10.0000 mg | ORAL_TABLET | Freq: Every day | ORAL | 3 refills | Status: DC

## 2019-02-27 NOTE — Assessment & Plan Note (Signed)
Recently started on atorvastatin, but has developed myalgias and headaches Switch from atorvastatin to Crestor

## 2019-02-27 NOTE — Assessment & Plan Note (Signed)
Discussed importance of healthy weight management Discussed diet and exercise  

## 2019-02-27 NOTE — Progress Notes (Signed)
Patient: Cindy Trujillo Female    DOB: 01-17-1956   63 y.o.   MRN: [id number] Visit Date: 02/27/2019  Today's Provider: Lavon Paganini, MD   Chief Complaint  Patient presents with  . Diabetes   Subjective:     HPI  Diabetes Mellitus Type II, Follow-up:   Lab Results  Component Value Date   HGBA1C 6.7 (H) 10/12/2018   HGBA1C 7.2 (A) 06/23/2018   HGBA1C 7.3 (A) 03/17/2018    Last seen for diabetes 4 months ago.  Management since then includes no changes. She reports excellent compliance with treatment. She is not having side effects.  Current symptoms include none and have been stable. Home blood sugar records: fasting range: 120's  Episodes of hypoglycemia? no   Current insulin regiment: Is not on insulin Most Recent Eye Exam:  Weight trend: stable Prior visit with dietician: No Current exercise: walking Current diet habits: in general, an "unhealthy" diet  Pertinent Labs:    Component Value Date/Time   CHOL 214 (H) 10/12/2018 1047   TRIG 287 (H) 10/12/2018 1047   HDL 47 10/12/2018 1047   LDLCALC 110 (H) 10/12/2018 1047   LDLCALC 119 (H) 01/15/2017 1026   CREATININE 1.06 (H) 10/12/2018 1047   CREATININE 0.81 01/15/2017 1026    Wt Readings from Last 3 Encounters:  02/27/19 228 lb (103.4 kg)  11/29/18 232 lb (105.2 kg)  10/10/18 232 lb (105.2 kg)    ------------------------------------------------------------------------ She has follow-up this week with vascular surgery regarding her right lower extremity DVT to see if she can stop Eliquis.  She is taking this with good compliance.  She denies any bleeding.  Patient reports some myalgias and headaches since starting atorvastatin.  She is wondering if this is a possible side effect.  She is taking it with good compliance.   Allergies  Allergen Reactions  . Sulfa Antibiotics Hives     Current Outpatient Medications:  .  ACCU-CHEK FASTCLIX LANCETS MISC, Use as directed to check blood sugar  1-2 times daily, Disp: 100 each, Rfl: 3 .  apixaban (ELIQUIS) 5 MG TABS tablet, Take 1 tablet (5 mg total) by mouth 2 (two) times daily., Disp: 60 tablet, Rfl: 3 .  atorvastatin (LIPITOR) 40 MG tablet, Take 1 tablet (40 mg total) by mouth daily., Disp: 30 tablet, Rfl: 3 .  dapagliflozin propanediol (FARXIGA) 10 MG TABS tablet, Take 10 mg by mouth daily., Disp: 90 tablet, Rfl: 3 .  ferrous sulfate 325 (65 FE) MG tablet, Take by mouth., Disp: , Rfl:  .  glucose blood (ACCU-CHEK GUIDE) test strip, Use as instructed, Disp: 100 each, Rfl: 3 .  lisinopril (PRINIVIL,ZESTRIL) 5 MG tablet, Take 1 tablet (5 mg total) by mouth daily., Disp: 90 tablet, Rfl: 3 .  loratadine (CLARITIN) 10 MG tablet, Take 1 tablet (10 mg total) by mouth daily., Disp: 90 tablet, Rfl: 3 .  metFORMIN (GLUCOPHAGE) 1000 MG tablet, Take 1 tablet (1,000 mg total) by mouth 2 (two) times daily with a meal., Disp: 180 tablet, Rfl: 3 .  omeprazole (PRILOSEC) 20 MG capsule, Take 1 capsule (20 mg total) by mouth daily., Disp: 90 capsule, Rfl: 3  Review of Systems  Constitutional: Negative.   Eyes: Negative.   Respiratory: Negative.   Cardiovascular: Negative.   Endocrine: Negative.   Musculoskeletal: Positive for arthralgias and myalgias. Negative for joint swelling.  Neurological: Positive for headaches. Negative for dizziness, syncope, speech difficulty, weakness and numbness.  Psychiatric/Behavioral: Negative.  Social History   Tobacco Use  . Smoking status: Never Smoker  . Smokeless tobacco: Never Used  Substance Use Topics  . Alcohol use: No    Alcohol/week: 0.0 standard drinks      Objective:   BP 106/69 (BP Location: Right Arm, Patient Position: Sitting, Cuff Size: Large)   Pulse 76   Temp (!) 97.3 F (36.3 C) (Temporal)   Resp 16   Ht 5' 5"  (1.651 m)   Wt 228 lb (103.4 kg)   BMI 37.94 kg/m  Vitals:   02/27/19 0816  BP: 106/69  Pulse: 76  Resp: 16  Temp: (!) 97.3 F (36.3 C)  TempSrc: Temporal   Weight: 228 lb (103.4 kg)  Height: 5' 5"  (1.651 m)  Body mass index is 37.94 kg/m.   Physical Exam Vitals signs reviewed.  Constitutional:      General: She is not in acute distress.    Appearance: Normal appearance. She is well-developed. She is not diaphoretic.  HENT:     Head: Normocephalic and atraumatic.  Eyes:     General: No scleral icterus.    Conjunctiva/sclera: Conjunctivae normal.  Neck:     Musculoskeletal: Neck supple.     Thyroid: No thyromegaly.  Cardiovascular:     Rate and Rhythm: Normal rate and regular rhythm.     Pulses: Normal pulses.     Heart sounds: Normal heart sounds. No murmur.  Pulmonary:     Effort: Pulmonary effort is normal. No respiratory distress.     Breath sounds: Normal breath sounds. No wheezing, rhonchi or rales.  Musculoskeletal:     Right lower leg: No edema.     Left lower leg: No edema.  Lymphadenopathy:     Cervical: No cervical adenopathy.  Skin:    General: Skin is warm and dry.     Capillary Refill: Capillary refill takes less than 2 seconds.     Findings: No rash.  Neurological:     Mental Status: She is alert and oriented to person, place, and time. Mental status is at baseline.  Psychiatric:        Mood and Affect: Mood normal.        Behavior: Behavior normal.      No results found for any visits on 02/27/19.     Assessment & Plan   Problem List Items Addressed This Visit      Cardiovascular and Mediastinum   Acute deep vein thrombosis (DVT) of femoral vein of right lower extremity (HCC)    Followed by vascular surgery Found incidentally on imaging for varicose veins Unprovoked Has follow-up this week with vascular surgery to determine whether she will continue on Eliquis      Relevant Medications   apixaban (ELIQUIS) 5 MG TABS tablet   rosuvastatin (CRESTOR) 5 MG tablet     Endocrine   T2DM (type 2 diabetes mellitus) ([place]) - Primary    Chronic and previously well controlled Continue current  medications Up-to-date on vaccinations and screenings Recheck A1c On ACE inhibitor and statin Follow-up in 3 months      Relevant Medications   rosuvastatin (CRESTOR) 5 MG tablet   glucose blood (ACCU-CHEK GUIDE) test strip   Other Relevant Orders   Hemoglobin A1c   Hyperlipidemia associated with type 2 diabetes mellitus ([place])    Recently started on atorvastatin, but has developed myalgias and headaches Switch from atorvastatin to Crestor      Relevant Medications   rosuvastatin (CRESTOR) 5 MG tablet  Other   Morbid obesity ([place])    Discussed importance of healthy weight management Discussed diet and exercise          Return in about 3 months (around 05/30/2019) for chronic disease f/u.   The entirety of the information documented in the History of Present Illness, Review of Systems and Physical Exam were personally obtained by me. Portions of this information were initially documented by Lynford Humphrey, CMA and reviewed by me for thoroughness and accuracy.    Felis Quillin, Dionne Bucy, MD MPH [facility]

## 2019-02-27 NOTE — Assessment & Plan Note (Signed)
Chronic and previously well controlled Continue current medications Up-to-date on vaccinations and screenings Recheck A1c On ACE inhibitor and statin Follow-up in 3 months

## 2019-02-27 NOTE — Assessment & Plan Note (Signed)
Followed by vascular surgery Found incidentally on imaging for varicose veins Unprovoked Has follow-up this week with vascular surgery to determine whether she will continue on Eliquis

## 2019-02-28 LAB — BASIC METABOLIC PANEL
BUN/Creatinine Ratio: 14 (ref 12–28)
BUN: 17 mg/dL (ref 8–27)
CO2: 17 mmol/L — ABNORMAL LOW (ref 20–29)
Calcium: 9.4 mg/dL (ref 8.7–10.3)
Chloride: 105 mmol/L (ref 96–106)
Creatinine, Ser: 1.18 mg/dL — ABNORMAL HIGH (ref 0.57–1.00)
GFR calc Af Amer: 58 mL/min/{1.73_m2} — ABNORMAL LOW (ref >59)
GFR calc non Af Amer: 50 mL/min/{1.73_m2} — ABNORMAL LOW (ref >59)
Glucose: 127 mg/dL — ABNORMAL HIGH (ref 65–99)
Potassium: 4 mmol/L (ref 3.5–5.2)
Sodium: 138 mmol/L (ref 134–144)

## 2019-02-28 LAB — CBC WITH DIFFERENTIAL/PLATELET
Basophils Absolute: 0 10*3/uL (ref 0.0–0.2)
Basos: 0 %
EOS (ABSOLUTE): 0.1 10*3/uL (ref 0.0–0.4)
Eos: 2 %
Hematocrit: 34.1 % (ref 34.0–46.6)
Hemoglobin: 11.7 g/dL (ref 11.1–15.9)
Immature Grans (Abs): 0 10*3/uL (ref 0.0–0.1)
Immature Granulocytes: 0 %
Lymphocytes Absolute: 1.7 10*3/uL (ref 0.7–3.1)
Lymphs: 33 %
MCH: 26.8 pg (ref 26.6–33.0)
MCHC: 34.3 g/dL (ref 31.5–35.7)
MCV: 78 fL — ABNORMAL LOW (ref 79–97)
Monocytes Absolute: 0.3 10*3/uL (ref 0.1–0.9)
Monocytes: 7 %
Neutrophils Absolute: 2.9 10*3/uL (ref 1.4–7.0)
Neutrophils: 58 %
Platelets: 106 10*3/uL — ABNORMAL LOW (ref 150–450)
RBC: 4.36 x10E6/uL (ref 3.77–5.28)
RDW: 13.7 % (ref 11.7–15.4)
WBC: 5.1 10*3/uL (ref 3.4–10.8)

## 2019-02-28 LAB — HEMOGLOBIN A1C
Est. average glucose Bld gHb Est-mCnc: 146 mg/dL
Hgb A1c MFr Bld: 6.7 % — ABNORMAL HIGH (ref 4.8–5.6)

## 2019-03-01 ENCOUNTER — Encounter: Payer: Self-pay | Admitting: Family Medicine

## 2019-03-01 ENCOUNTER — Telehealth: Payer: Self-pay

## 2019-03-01 DIAGNOSIS — D696 Thrombocytopenia, unspecified: Secondary | ICD-10-CM

## 2019-03-01 NOTE — Telephone Encounter (Signed)
Viewed by Teodoro Spray on 03/01/2019 3:48 PM

## 2019-03-01 NOTE — Telephone Encounter (Signed)
lmtcb for lab results

## 2019-03-01 NOTE — Telephone Encounter (Signed)
-----   Message from Virginia Crews, MD sent at 03/01/2019 11:34 AM EST ----- Normal/Stable labs.  A1c remains well controlled.  No changes to medications

## 2019-03-02 ENCOUNTER — Ambulatory Visit (INDEPENDENT_AMBULATORY_CARE_PROVIDER_SITE_OTHER): Payer: 59

## 2019-03-02 ENCOUNTER — Encounter (INDEPENDENT_AMBULATORY_CARE_PROVIDER_SITE_OTHER): Payer: Self-pay | Admitting: Nurse Practitioner

## 2019-03-02 ENCOUNTER — Other Ambulatory Visit: Payer: Self-pay

## 2019-03-02 ENCOUNTER — Ambulatory Visit (INDEPENDENT_AMBULATORY_CARE_PROVIDER_SITE_OTHER): Payer: 59 | Admitting: Nurse Practitioner

## 2019-03-02 VITALS — BP 118/69 | HR 76 | Resp 16 | Ht 65.0 in | Wt 230.0 lb

## 2019-03-02 DIAGNOSIS — I83813 Varicose veins of bilateral lower extremities with pain: Secondary | ICD-10-CM

## 2019-03-02 DIAGNOSIS — E785 Hyperlipidemia, unspecified: Secondary | ICD-10-CM

## 2019-03-02 DIAGNOSIS — I82531 Chronic embolism and thrombosis of right popliteal vein: Secondary | ICD-10-CM

## 2019-03-02 DIAGNOSIS — I82411 Acute embolism and thrombosis of right femoral vein: Secondary | ICD-10-CM | POA: Diagnosis not present

## 2019-03-02 DIAGNOSIS — E1169 Type 2 diabetes mellitus with other specified complication: Secondary | ICD-10-CM

## 2019-03-03 ENCOUNTER — Encounter (INDEPENDENT_AMBULATORY_CARE_PROVIDER_SITE_OTHER): Payer: Self-pay

## 2019-03-03 ENCOUNTER — Encounter (INDEPENDENT_AMBULATORY_CARE_PROVIDER_SITE_OTHER): Payer: Self-pay | Admitting: Nurse Practitioner

## 2019-03-03 NOTE — Progress Notes (Signed)
SUBJECTIVE:  [name], female    DOB: 1955-09-22, 63 y.o.   MRN: [id number] Chief Complaint  Patient presents with  . Follow-up    ultrasound    HPI  Cindy Trujillo is a 63 y.o. female that is following up today in regards to the varicose vein in her lower extremities as well as her right lower extremity DVT.  Initially the patient had an occlusive DVT from the femoral to popliteal vein.  Today the patient states that her swelling is much improved however she does continue to have some postphlebitic pain at times.  She denies any fever, chills, nausea, vomiting.   The patient continues to have pain in the lower extremities with dependency, the left worse than the right. The pain is lessened with elevation. Graduated compression stockings, Class I (20-30 mmHg), have been worn but the stockings do not eliminate the leg pain. Over-the-counter analgesics do not improve the symptoms. The degree of discomfort continues to interfere with daily activities. The patient notes the pain in the legs is causing problems with daily exercise, at the workplace and even with household activities and maintenance such as standing in the kitchen preparing meals and doing dishes.   Today noninvasive studies show that in the right lower extremity there is minimal residual chronic appearing DVT in the back wall of the popliteal vein.  There is near full compression and full recannulized flow.  There is reflux present within the common femoral vein, femoral vein and popliteal vein.  No evidence of superficial reflux in the right lower extremity.  The left lower extremity has reflux present in the common femoral vein as well as reflux in the great saphenous vein at the saphenofemoral junction down to the level of the great saphenous vein at the proximal calf.  The diameters range from 0.5cm to 1.29 cm Past Medical History:  Diagnosis Date  . Diabetes mellitus without complication ([place])   . GERD  (gastroesophageal reflux disease)   . Hyperlipidemia     Past Surgical History:  Procedure Laterality Date  . COLONOSCOPY WITH PROPOFOL N/A 08/23/2017   Procedure: COLONOSCOPY WITH PROPOFOL;  Surgeon: Manya Silvas, MD;  Location: [facility] ENDOSCOPY;  Service: Endoscopy;  Laterality: N/A;  . TONSILLECTOMY AND ADENOIDECTOMY  1968    Social History   Socioeconomic History  . Marital status: Married    Spouse name: Francee Piccolo  . Number of children: 0  . Years of education: some college  . Highest education level: Not on file  Occupational History  . Occupation: CARE MANGEMENT    Employer: [employer]  Social Needs  . Financial resource strain: Not on file  . Food insecurity    Worry: Not on file    Inability: Not on file  . Transportation needs    Medical: Not on file    Non-medical: Not on file  Tobacco Use  . Smoking status: Never Smoker  . Smokeless tobacco: Never Used  Substance and Sexual Activity  . Alcohol use: No    Alcohol/week: 0.0 standard drinks  . Drug use: No  . Sexual activity: Not Currently  Lifestyle  . Physical activity    Days per week: Not on file    Minutes per session: Not on file  . Stress: Not on file  Relationships  . Social Herbalist on phone: Not on file    Gets together: Not on file    Attends religious service: Not  on file    Active member of club or organization: Not on file    Attends meetings of clubs or organizations: Not on file    Relationship status: Not on file  . Intimate partner violence    Fear of current or ex partner: Not on file    Emotionally abused: Not on file    Physically abused: Not on file    Forced sexual activity: Not on file  Other Topics Concern  . Not on file  Social History Narrative  . Not on file    Family History  Problem Relation Age of Onset  . COPD Mother        heavy smoker  . Pulmonary fibrosis Mother   . Fibromyalgia Mother   . COPD Father   . Melanoma Father   .  COPD Sister   . COPD Brother   . COPD Sister   . COPD Sister   . Healthy Brother   . Heart failure Maternal Grandmother   . Heart attack Maternal Grandfather   . Cancer Paternal Grandmother        unsure of type, had mets in colon  . Heart attack Paternal Grandfather   . Cancer Paternal Grandfather        unknown  . Breast cancer Neg Hx   . Cervical cancer Neg Hx     Allergies  Allergen Reactions  . Sulfa Antibiotics Hives     Review of Systems   Review of Systems: Negative Unless Checked Constitutional: [] Weight loss  [] Fever  [] Chills Cardiac: [] Chest pain   []  Atrial Fibrillation  [] Palpitations   [] Shortness of breath when laying flat   [] Shortness of breath with exertion. [] Shortness of breath at rest Vascular:  [] Pain in legs with walking   [] Pain in legs with standing [] Pain in legs when laying flat   [] Claudication    [] Pain in feet when laying flat    [x] History of DVT   [] Phlebitis   [x] Swelling in legs   [x] Varicose veins   [] Non-healing ulcers Pulmonary:   [] Uses home oxygen   [] Productive cough   [] Hemoptysis   [] Wheeze  [] COPD   [] Asthma Neurologic:  [] Dizziness   [] Seizures  [] Blackouts [] History of stroke   [] History of TIA  [] Aphasia   [] Temporary Blindness   [] Weakness or numbness in arm   [] Weakness or numbness in leg Musculoskeletal:   [] Joint swelling   [] Joint pain   [] Low back pain  []  History of Knee Replacement [] Arthritis [] back Surgeries  []  Spinal Stenosis    Hematologic:  [] Easy bruising  [] Easy bleeding   [] Hypercoagulable state   [] Anemic Gastrointestinal:  [] Diarrhea   [] Vomiting  [x] Gastroesophageal reflux/heartburn   [] Difficulty swallowing. [] Abdominal pain Genitourinary:  [] Chronic kidney disease   [] Difficult urination  [] Anuric   [] Blood in urine [] Frequent urination  [] Burning with urination   [] Hematuria Skin:  [] Rashes   [] Ulcers [] Wounds Psychological:  [] History of anxiety   []  History of major depression  []  Memory Difficulties       OBJECTIVE:   Physical Exam  BP 118/69 (BP Location: Right Arm)   Pulse 76   Resp 16   Ht 5' 5"  (1.651 m)   Wt 230 lb (104.3 kg)   BMI 38.27 kg/m   Gen: WD/WN, NAD Head: [place]/AT, No temporalis wasting.  Ear/Nose/Throat: Hearing grossly intact, nares w/o erythema or drainage Eyes: PER, EOMI, sclera nonicteric.  Neck: Supple, no masses.  No JVD.  Pulmonary:  Good air movement, no use  of accessory muscles.  Cardiac: RRR Vascular: 2+ edema bilaterally. Scattered spider veins bilaterally.  Large varicosity on left lower extremity from 5-9 cm in size  Vessel Right Left  Radial Palpable Palpable  Dorsalis Pedis Palpable Palpable  Posterior Tibial Palpable Palpable   Gastrointestinal: soft, non-distended. No guarding/no peritoneal signs.  Musculoskeletal: M/S 5/5 throughout.  No deformity or atrophy.  Neurologic: Pain and light touch intact in extremities.  Symmetrical.  Speech is fluent. Motor exam as listed above. Psychiatric: Judgment intact, Mood & affect appropriate for pt's clinical situation. Dermatologic: No Venous rashes. No Ulcers Noted.  No changes consistent with cellulitis. Lymph : No Cervical lymphadenopathy, no lichenification or skin changes of chronic lymphedema.       ASSESSMENT AND PLAN:  1. 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  At this time the patient will begin using Eliquis 2.5 mg twice a day.  The patient has had full anticoagulation for 6 months following her initial DVT.  We will continue this dosage for 3 months.  Patient will continue to engage in conservative therapy.  2. Varicose veins of both lower extremities with pain Recommend  I have reviewed my previous  discussion with the patient regarding  varicose veins and why they cause symptoms. Patient will continue  wearing graduated compression stockings class 1 on a daily basis, beginning first  thing in the morning and removing them in the evening.    In addition, behavioral modification including elevation during the day was again discussed and this will continue.  The patient has utilized over the counter pain medications and has been exercising.  However, at this time conservative therapy has not alleviated the patient's symptoms of leg pain and swelling  Recommend: laser ablation of the left great saphenous veins to eliminate the symptoms of pain and swelling of the lower extremities caused by the severe superficial venous reflux disease.   3. Hyperlipidemia associated with type 2 diabetes mellitus ([place]) Continue statin as ordered and reviewed, no changes at this time    Current Outpatient Medications on File Prior to Visit  Medication Sig Dispense Refill  . ACCU-CHEK FASTCLIX LANCETS MISC Use as directed to check blood sugar 1-2 times daily 100 each 3  . apixaban (ELIQUIS) 5 MG TABS tablet Take 1 tablet (5 mg total) by mouth 2 (two) times daily. 60 tablet 3  . dapagliflozin propanediol (FARXIGA) 10 MG TABS tablet Take 10 mg by mouth daily. 90 tablet 3  . ferrous sulfate 325 (65 FE) MG tablet Take by mouth.    Marland Kitchen glucose blood (ACCU-CHEK GUIDE) test strip Use as instructed 100 each 3  . lisinopril (PRINIVIL,ZESTRIL) 5 MG tablet Take 1 tablet (5 mg total) by mouth daily. 90 tablet 3  . loratadine (CLARITIN) 10 MG tablet Take 1 tablet (10 mg total) by mouth daily. 90 tablet 3  . metFORMIN (GLUCOPHAGE) 1000 MG tablet Take 1 tablet (1,000 mg total) by mouth 2 (two) times daily with a meal. 180 tablet 3  . omeprazole (PRILOSEC) 20 MG capsule Take 1 capsule (20 mg total) by mouth daily. 90 capsule 3  . rosuvastatin (CRESTOR) 5 MG tablet Take 1 tablet (5 mg total) by mouth daily. 30 tablet 3   No current facility-administered medications on file prior to visit.     There are no Patient Instructions on file for this visit. No follow-ups on file.   Lucretia Roers  Owens Shark, NP  This  note was completed with Sales executive.  Any errors are purely unintentional.

## 2019-03-07 ENCOUNTER — Encounter: Payer: Self-pay | Admitting: Family Medicine

## 2019-03-13 ENCOUNTER — Other Ambulatory Visit: Payer: Self-pay

## 2019-03-14 ENCOUNTER — Other Ambulatory Visit: Payer: Self-pay

## 2019-03-14 ENCOUNTER — Inpatient Hospital Stay: Payer: 59 | Attending: Oncology | Admitting: Oncology

## 2019-03-14 ENCOUNTER — Inpatient Hospital Stay: Payer: 59

## 2019-03-14 ENCOUNTER — Encounter: Payer: Self-pay | Admitting: Oncology

## 2019-03-14 VITALS — BP 111/75 | HR 80 | Temp 97.3°F | Resp 16 | Wt 232.2 lb

## 2019-03-14 DIAGNOSIS — D649 Anemia, unspecified: Secondary | ICD-10-CM | POA: Insufficient documentation

## 2019-03-14 DIAGNOSIS — Z7984 Long term (current) use of oral hypoglycemic drugs: Secondary | ICD-10-CM | POA: Diagnosis not present

## 2019-03-14 DIAGNOSIS — Z79899 Other long term (current) drug therapy: Secondary | ICD-10-CM | POA: Insufficient documentation

## 2019-03-14 DIAGNOSIS — D696 Thrombocytopenia, unspecified: Secondary | ICD-10-CM | POA: Diagnosis not present

## 2019-03-14 DIAGNOSIS — E785 Hyperlipidemia, unspecified: Secondary | ICD-10-CM | POA: Insufficient documentation

## 2019-03-14 DIAGNOSIS — Z7901 Long term (current) use of anticoagulants: Secondary | ICD-10-CM | POA: Diagnosis not present

## 2019-03-14 DIAGNOSIS — K760 Fatty (change of) liver, not elsewhere classified: Secondary | ICD-10-CM | POA: Diagnosis not present

## 2019-03-14 DIAGNOSIS — E119 Type 2 diabetes mellitus without complications: Secondary | ICD-10-CM | POA: Insufficient documentation

## 2019-03-14 DIAGNOSIS — K219 Gastro-esophageal reflux disease without esophagitis: Secondary | ICD-10-CM | POA: Insufficient documentation

## 2019-03-14 LAB — VITAMIN B12: Vitamin B-12: 195 pg/mL (ref 180–914)

## 2019-03-14 LAB — COMPREHENSIVE METABOLIC PANEL
ALT: 24 U/L (ref 0–44)
AST: 32 U/L (ref 15–41)
Albumin: 4.2 g/dL (ref 3.5–5.0)
Alkaline Phosphatase: 98 U/L (ref 38–126)
Anion gap: 10 (ref 5–15)
BUN: 15 mg/dL (ref 8–23)
CO2: 22 mmol/L (ref 22–32)
Calcium: 9.5 mg/dL (ref 8.9–10.3)
Chloride: 110 mmol/L (ref 98–111)
Creatinine, Ser: 1.03 mg/dL — ABNORMAL HIGH (ref 0.44–1.00)
GFR calc Af Amer: >60 mL/min (ref >60)
GFR calc non Af Amer: 59 mL/min — ABNORMAL LOW (ref >60)
Glucose, Bld: 106 mg/dL — ABNORMAL HIGH (ref 70–99)
Potassium: 4.1 mmol/L (ref 3.5–5.1)
Sodium: 142 mmol/L (ref 135–145)
Total Bilirubin: 1 mg/dL (ref 0.3–1.2)
Total Protein: 7.8 g/dL (ref 6.5–8.1)

## 2019-03-14 LAB — TECHNOLOGIST SMEAR REVIEW
Plt Morphology: DECREASED
RBC Morphology: NORMAL

## 2019-03-14 LAB — CBC WITH DIFFERENTIAL/PLATELET
Abs Immature Granulocytes: 0.01 10*3/uL (ref 0.00–0.07)
Basophils Absolute: 0 10*3/uL (ref 0.0–0.1)
Basophils Relative: 0 %
Eosinophils Absolute: 0.1 10*3/uL (ref 0.0–0.5)
Eosinophils Relative: 2 %
HCT: 37 % (ref 36.0–46.0)
Hemoglobin: 11.6 g/dL — ABNORMAL LOW (ref 12.0–15.0)
Immature Granulocytes: 0 %
Lymphocytes Relative: 36 %
Lymphs Abs: 2 10*3/uL (ref 0.7–4.0)
MCH: 26.3 pg (ref 26.0–34.0)
MCHC: 31.4 g/dL (ref 30.0–36.0)
MCV: 83.9 fL (ref 80.0–100.0)
Monocytes Absolute: 0.3 10*3/uL (ref 0.1–1.0)
Monocytes Relative: 5 %
Neutro Abs: 3.1 10*3/uL (ref 1.7–7.7)
Neutrophils Relative %: 57 %
Platelets: 112 10*3/uL — ABNORMAL LOW (ref 150–400)
RBC: 4.41 MIL/uL (ref 3.87–5.11)
RDW: 14.1 % (ref 11.5–15.5)
WBC: 5.5 10*3/uL (ref 4.0–10.5)
nRBC: 0 % (ref 0.0–0.2)

## 2019-03-14 LAB — IMMATURE PLATELET FRACTION: Immature Platelet Fraction: 2.6 % (ref 1.2–8.6)

## 2019-03-14 LAB — HEPATITIS PANEL, ACUTE
HCV Ab: NONREACTIVE
Hep A IgM: NONREACTIVE
Hep B C IgM: NONREACTIVE
Hepatitis B Surface Ag: NONREACTIVE

## 2019-03-14 LAB — FOLATE: Folate: 10.5 ng/mL (ref >5.9)

## 2019-03-14 LAB — HIV ANTIBODY (ROUTINE TESTING W REFLEX): HIV Screen 4th Generation wRfx: NONREACTIVE

## 2019-03-15 NOTE — Progress Notes (Signed)
Hematology/Oncology Consult note [facility] Telephone:([phone] [phone]   Patient Care Team: Virginia Crews, MD as PCP - General (Family Medicine)  REFERRING PROVIDER: Virginia Crews, MD  CHIEF COMPLAINTS/REASON FOR VISIT:  Evaluation of thrombocytopenia  HISTORY OF PRESENTING ILLNESS:   Cindy Trujillo is a  63 y.o.  female with PMH listed below was seen in consultation at the request of  Brita Romp, Dionne Bucy, MD  for evaluation of thrombocytopenia Patient works in a Tourist information centre manager at [employer].  She had blood work done recently 02/27/2019, platelet count 10 6000.  Mild anemia with hemoglobin 11.7.  Normal differential. Reviewing patient's previous medical history and labs. Noted that thrombocytopenia has been chronic, with onset in December 2017.  Gradually decreasing. Denies any alcohol use, known history of hepatitis or HIV. Reviewed patient's previous images. 03/31/2018 ultrasound abdomen limited right upper quadrant showed diffuse increase in liver echogenicity finding felt to be indicated of of hepatic steatosis.  No focal liver lesions are evident on this study, MSP cautioned that the sensitivity of the ultrasound of detecting focal liver lesions is diminished in the circumstances.  She denies any easy bruising, unintentional weight loss, night sweats, fever or chills.  Review of Systems  Constitutional: Negative for appetite change, chills, fatigue and fever.  HENT:   Negative for hearing loss and voice change.   Eyes: Negative for eye problems.  Respiratory: Negative for chest tightness and cough.   Cardiovascular: Negative for chest pain.  Gastrointestinal: Negative for abdominal distention, abdominal pain and blood in stool.  Endocrine: Negative for hot flashes.  Genitourinary: Negative for difficulty urinating and frequency.   Musculoskeletal: Negative for arthralgias.  Skin: Negative for itching and rash.   Neurological: Negative for extremity weakness.  Hematological: Negative for adenopathy.  Psychiatric/Behavioral: Negative for confusion.    MEDICAL HISTORY:  Past Medical History:  Diagnosis Date  . Diabetes mellitus without complication ([place])   . GERD (gastroesophageal reflux disease)   . Hyperlipidemia     SURGICAL HISTORY: Past Surgical History:  Procedure Laterality Date  . COLONOSCOPY WITH PROPOFOL N/A 08/23/2017   Procedure: COLONOSCOPY WITH PROPOFOL;  Surgeon: Manya Silvas, MD;  Location: [facility] ENDOSCOPY;  Service: Endoscopy;  Laterality: N/A;  . TONSILLECTOMY AND ADENOIDECTOMY  1968    SOCIAL HISTORY: Social History   Socioeconomic History  . Marital status: Married    Spouse name: Francee Piccolo  . Number of children: 0  . Years of education: some college  . Highest education level: Not on file  Occupational History  . Occupation: CARE MANGEMENT    Employer: [employer]  Social Needs  . Financial resource strain: Not on file  . Food insecurity    Worry: Not on file    Inability: Not on file  . Transportation needs    Medical: Not on file    Non-medical: Not on file  Tobacco Use  . Smoking status: Never Smoker  . Smokeless tobacco: Never Used  Substance and Sexual Activity  . Alcohol use: No    Alcohol/week: 0.0 standard drinks  . Drug use: No  . Sexual activity: Not Currently  Lifestyle  . Physical activity    Days per week: Not on file    Minutes per session: Not on file  . Stress: Not on file  Relationships  . Social Herbalist on phone: Not on file    Gets together: Not on file    Attends religious service:  Not on file    Active member of club or organization: Not on file    Attends meetings of clubs or organizations: Not on file    Relationship status: Not on file  . Intimate partner violence    Fear of current or ex partner: Not on file    Emotionally abused: Not on file    Physically abused: Not on file    Forced  sexual activity: Not on file  Other Topics Concern  . Not on file  Social History Narrative  . Not on file    FAMILY HISTORY: Family History  Problem Relation Age of Onset  . COPD Mother        heavy smoker  . Pulmonary fibrosis Mother   . Fibromyalgia Mother   . COPD Father   . Melanoma Father   . COPD Sister   . COPD Brother   . COPD Sister   . COPD Sister   . Healthy Brother   . Heart failure Maternal Grandmother   . Heart attack Maternal Grandfather   . Cancer Paternal Grandmother        unsure of type, had mets in colon  . Heart attack Paternal Grandfather   . Cancer Paternal Grandfather        unknown  . Breast cancer Neg Hx   . Cervical cancer Neg Hx     ALLERGIES:  is allergic to sulfa antibiotics.  MEDICATIONS:  Current Outpatient Medications  Medication Sig Dispense Refill  . ACCU-CHEK FASTCLIX LANCETS MISC Use as directed to check blood sugar 1-2 times daily 100 each 3  . apixaban (ELIQUIS) 5 MG TABS tablet Take 1 tablet (5 mg total) by mouth 2 (two) times daily. 60 tablet 3  . dapagliflozin propanediol (FARXIGA) 10 MG TABS tablet Take 10 mg by mouth daily. 90 tablet 3  . glucose blood (ACCU-CHEK GUIDE) test strip Use as instructed 100 each 3  . lisinopril (PRINIVIL,ZESTRIL) 5 MG tablet Take 1 tablet (5 mg total) by mouth daily. 90 tablet 3  . loratadine (CLARITIN) 10 MG tablet Take 1 tablet (10 mg total) by mouth daily. 90 tablet 3  . metFORMIN (GLUCOPHAGE) 1000 MG tablet Take 1 tablet (1,000 mg total) by mouth 2 (two) times daily with a meal. 180 tablet 3  . omeprazole (PRILOSEC) 20 MG capsule Take 1 capsule (20 mg total) by mouth daily. 90 capsule 3  . rosuvastatin (CRESTOR) 5 MG tablet Take 1 tablet (5 mg total) by mouth daily. 30 tablet 3  . ferrous sulfate 325 (65 FE) MG tablet Take by mouth.     No current facility-administered medications for this visit.      PHYSICAL EXAMINATION: ECOG PERFORMANCE STATUS: 0 - Asymptomatic Vitals:   03/14/19  1510  BP: 111/75  Pulse: 80  Resp: 16  Temp: (!) 97.3 F (36.3 C)   Filed Weights   03/14/19 1510  Weight: 232 lb 3.2 oz (105.3 kg)    Physical Exam Constitutional:      General: She is not in acute distress. HENT:     Head: Normocephalic and atraumatic.  Eyes:     General: No scleral icterus.    Pupils: Pupils are equal, round, and reactive to light.  Neck:     Musculoskeletal: Normal range of motion and neck supple.  Cardiovascular:     Rate and Rhythm: Normal rate and regular rhythm.     Heart sounds: Normal heart sounds.  Pulmonary:     Effort: Pulmonary effort  is normal. No respiratory distress.     Breath sounds: No wheezing.  Abdominal:     General: Bowel sounds are normal. There is no distension.     Palpations: Abdomen is soft. There is no mass.     Tenderness: There is no abdominal tenderness.  Musculoskeletal: Normal range of motion.        General: No deformity.  Skin:    General: Skin is warm and dry.     Findings: No erythema or rash.  Neurological:     Mental Status: She is alert and oriented to person, place, and time.     Cranial Nerves: No cranial nerve deficit.     Coordination: Coordination normal.  Psychiatric:        Behavior: Behavior normal.        Thought Content: Thought content normal.     LABORATORY DATA:  I have reviewed the data as listed Lab Results  Component Value Date   WBC 5.5 03/14/2019   HGB 11.6 (L) 03/14/2019   HCT 37.0 03/14/2019   MCV 83.9 03/14/2019   PLT 112 (L) 03/14/2019   Recent Labs    03/30/18 1627 10/12/18 1047 02/27/19 0815 03/14/19 1554  NA 139 142 138 142  K 3.9 4.3 4.0 4.1  CL 105 108* 105 110  CO2 19* 18* 17* 22  GLUCOSE 100* 108* 127* 106*  BUN 16 16 17 15   CREATININE 0.91 1.06* 1.18* 1.03*  CALCIUM 9.5 9.3 9.4 9.5  GFRNONAA 69 57* 50* 59*  GFRAA 79 66 58* >60  PROT 7.0 7.1  --  7.8  ALBUMIN 4.2 4.3  --  4.2  AST 42* 44*  --  32  ALT 32 29  --  24  ALKPHOS 99 100  --  98  BILITOT 0.7  0.7  --  1.0   Iron/TIBC/Ferritin/ %Sat No results found for: IRON, TIBC, FERRITIN, IRONPCTSAT    RADIOGRAPHIC STUDIES: I have personally reviewed the radiological images as listed and agreed with the findings in the report.  Vas [place] Lower Extremity Venous Reflux  Result Date: 03/13/2019  Lower Venous Reflux Study Indications: Varicosities.  Risk Factors: DVT Follow-up Rt leg. Comparison Study: 11/29/2018 Performing Technologist: Concha Norway RVT  Examination Guidelines: A complete evaluation includes B-mode imaging, spectral Doppler, color Doppler, and power Doppler as needed of all accessible portions of each vessel. Bilateral testing is considered an integral part of a complete examination. Limited examinations for reoccurring indications may be performed as noted. The reflux portion of the exam is performed with the patient in reverse Trendelenburg.  +---------+---------------+---------+-----------+----------+--------------+ RIGHT    CompressibilityPhasicitySpontaneityPropertiesThrombus Aging +---------+---------------+---------+-----------+----------+--------------+ CFV      Full           Yes      Yes                                 +---------+---------------+---------+-----------+----------+--------------+ SFJ      Full           Yes      Yes                                 +---------+---------------+---------+-----------+----------+--------------+ FV Prox  Full                                                        +---------+---------------+---------+-----------+----------+--------------+  FV Mid   Full           Yes      Yes                                 +---------+---------------+---------+-----------+----------+--------------+ FV DistalFull                                                        +---------+---------------+---------+-----------+----------+--------------+ POP      Partial        Yes      Yes                                  +---------+---------------+---------+-----------+----------+--------------+ PTV      Full                                                        +---------+---------------+---------+-----------+----------+--------------+ GSV      Full           Yes      Yes                                 +---------+---------------+---------+-----------+----------+--------------+ SSV      Full                                                        +---------+---------------+---------+-----------+----------+--------------+  +---------+---------------+---------+-----------+----------+--------------+ LEFT     CompressibilityPhasicitySpontaneityPropertiesThrombus Aging +---------+---------------+---------+-----------+----------+--------------+ CFV      Full           Yes      Yes                                 +---------+---------------+---------+-----------+----------+--------------+ SFJ      Full           Yes      Yes                                 +---------+---------------+---------+-----------+----------+--------------+ FV Prox  Full                                                        +---------+---------------+---------+-----------+----------+--------------+ FV Mid   Full           Yes      Yes                                 +---------+---------------+---------+-----------+----------+--------------+ FV DistalFull                                                        +---------+---------------+---------+-----------+----------+--------------+  POP      Full           Yes      Yes                                 +---------+---------------+---------+-----------+----------+--------------+ GSV      Full           Yes      Yes                                 +---------+---------------+---------+-----------+----------+--------------+ SSV      Full                                                         +---------+---------------+---------+-----------+----------+--------------+  Venous Reflux Times Normal value < 0.5 sec +---------+------+---------+--------+--------+ RIGHT    RefluxReflux NoDiameterComments           Yes                            +---------+------+---------+--------+--------+ CFV       yes                            +---------+------+---------+--------+--------+ FV mid    yes                            +---------+------+---------+--------+--------+ Popliteal yes                            +---------+------+---------+--------+--------+  +--------------+------+---------+--------+-----------------+ LEFT          RefluxReflux NoDiameterComments                         Yes                                     +--------------+------+---------+--------+-----------------+ CFV            yes                                     +--------------+------+---------+--------+-----------------+ GSV at SFJ     yes             1.29                    +--------------+------+---------+--------+-----------------+ GSV prox thigh yes             .95                     +--------------+------+---------+--------+-----------------+ GSV mid thigh  yes             .55                     +--------------+------+---------+--------+-----------------+ GSV dist thigh yes             .56                     +--------------+------+---------+--------+-----------------+  GSV at knee    yes             .61   varicosity 1.29cm +--------------+------+---------+--------+-----------------+ GSV prox calf  yes             .47   varocosity        +--------------+------+---------+--------+-----------------+  Summary: Right: Evidence of chronic venous insufficiency is detected in the deep venous system. There is no evidence of superficial venous thrombosis. There is minimal residual chronic appearing DVT on the back wall of popliteal vein with near full  compression and full recanalized flow. Left: Evidence of chronic venous insufficiency is detected in the deep venous system, and great saphenous vein. There is no evidence of deep vein thrombosis in the lower extremity.There is no evidence of superficial venous thrombosis.  *See table(s) above for measurements and observations. Electronically signed by Leotis Pain MD on 03/13/2019 at 10:43:53 AM.    Final       ASSESSMENT & PLAN:  1. Thrombocytopenia ([place])   2. Normocytic anemia   Discussed with patient that thrombocytopenia can be seen in multiple circumstances, including nutrition deficiencies, medication, autoimmune disease, bone marrow underproduction/malignancies, chronic liver disease etc. For the work up of patient's thrombocytopenia, I recommend checking CBC;CMP, LDH; smear review, folate, Vitamin B12, hepatitis, HIV,  Flowcytometry, immature platelet fraction.  Additional testing based on these work-up results.. Will also check ultrasound of the abdomen.  Patient has a history of abnormal echogenicity, reflecting underlying fatty liver disease.  Chronic liver disease may contribute to thrombocytopenia.  Also, discussed with the patient that if no clear etiology found- bone marrow biopsy would be suggested. Currently await for the above workup.   Normocytic anemia, very mild.  Pending above work-up. # Patient follow-up with me in approximately 2 weeks to review the above results.   Orders Placed This Encounter  Procedures  . US Abdomen Complete    Standing Status:   Future    Standing Expiration Date:   03/13/2020    Order Specific Question:   Reason for Exam (SYMPTOM  OR DIAGNOSIS REQUIRED)    Answer:   fatty liver disease, thrombocytopenia    Order Specific Question:   Preferred imaging location?    Answer:   [facility]  . CBC with Differential/Platelet    Standing Status:   Future    Number of Occurrences:   1    Standing Expiration Date:   03/13/2020  . Technologist  smear review    Standing Status:   Future    Number of Occurrences:   1    Standing Expiration Date:   03/13/2020  . Comprehensive metabolic panel    Standing Status:   Future    Number of Occurrences:   1    Standing Expiration Date:   03/13/2020  . Hepatitis panel, acute    Standing Status:   Future    Number of Occurrences:   1    Standing Expiration Date:   03/13/2020  . HIV antibody    Standing Status:   Future    Number of Occurrences:   1    Standing Expiration Date:   03/13/2020  . Immature Platelet Fraction    Standing Status:   Future    Number of Occurrences:   1    Standing Expiration Date:   03/13/2020  . Vitamin B12    Standing Status:   Future    Number of Occurrences:   1    Standing Expiration Date:  03/13/2020  . Folate    Standing Status:   Future    Number of Occurrences:   1    Standing Expiration Date:   03/13/2020  . Flow cytometry panel-leukemia/lymphoma work-up    Standing Status:   Future    Number of Occurrences:   1    Standing Expiration Date:   03/13/2020    All questions were answered. The patient knows to call the clinic with any problems questions or concerns.  cc Virginia Crews, MD    Return of visit: 1-2 weeks Thank you for this kind referral and the opportunity to participate in the care of this patient. A copy of today's note is routed to referring provider  Total face to face encounter time for this patient visit was 45 min. >50% of the time was  spent in counseling and coordination of care.    Earlie Server, MD, PhD Hematology Oncology [facility] Pager- [phone] 03/15/2019

## 2019-03-16 ENCOUNTER — Encounter (INDEPENDENT_AMBULATORY_CARE_PROVIDER_SITE_OTHER): Payer: Self-pay

## 2019-03-19 LAB — COMP PANEL: LEUKEMIA/LYMPHOMA

## 2019-03-20 ENCOUNTER — Ambulatory Visit
Admission: RE | Admit: 2019-03-20 | Discharge: 2019-03-20 | Disposition: A | Discharge status: Home / Self Care | Payer: 59 | Source: Ambulatory Visit | Attending: Oncology | Admitting: Oncology

## 2019-03-20 ENCOUNTER — Ambulatory Visit (INDEPENDENT_AMBULATORY_CARE_PROVIDER_SITE_OTHER): Payer: 59 | Admitting: Family Medicine

## 2019-03-20 ENCOUNTER — Other Ambulatory Visit: Payer: Self-pay

## 2019-03-20 DIAGNOSIS — D696 Thrombocytopenia, unspecified: Secondary | ICD-10-CM | POA: Insufficient documentation

## 2019-03-20 DIAGNOSIS — Z23 Encounter for immunization: Secondary | ICD-10-CM | POA: Diagnosis not present

## 2019-03-20 DIAGNOSIS — K760 Fatty (change of) liver, not elsewhere classified: Secondary | ICD-10-CM | POA: Diagnosis not present

## 2019-03-21 ENCOUNTER — Inpatient Hospital Stay (HOSPITAL_BASED_OUTPATIENT_CLINIC_OR_DEPARTMENT_OTHER): Payer: 59 | Admitting: Oncology

## 2019-03-21 ENCOUNTER — Encounter: Payer: Self-pay | Admitting: Oncology

## 2019-03-21 DIAGNOSIS — D649 Anemia, unspecified: Secondary | ICD-10-CM | POA: Diagnosis not present

## 2019-03-21 DIAGNOSIS — R932 Abnormal findings on diagnostic imaging of liver and biliary tract: Secondary | ICD-10-CM

## 2019-03-21 DIAGNOSIS — D696 Thrombocytopenia, unspecified: Secondary | ICD-10-CM | POA: Diagnosis not present

## 2019-03-21 MED ORDER — VITAMIN B-12 1000 MCG PO TABS: 1000.0000 ug | ORAL_TABLET | Freq: Every day | ORAL | 1 refills | Status: DC

## 2019-03-21 NOTE — Progress Notes (Signed)
HEMATOLOGY-ONCOLOGY TeleHEALTH VISIT PROGRESS NOTE  I connected with Cindy Trujillo on 03/21/19 at  2:15 PM EST by video enabled telemedicine visit and verified that I am speaking with the correct person using two identifiers. I discussed the limitations, risks, security and privacy concerns of performing an evaluation and management service by telemedicine and the availability of in-person appointments. I also discussed with the patient that there may be a patient responsible charge related to this service. The patient expressed understanding and agreed to proceed.   Other persons participating in the visit and their role in the encounter:  None  Patient's location: At work Provider's location: office Chief Complaint: Follow-up for thrombocytopenia.   INTERVAL HISTORY Cindy Trujillo is a 63 y.o. female who has above history reviewed by me today presents for follow up visit for management of .thrombocytopenia  Problems and complaints are listed below:  Patient had work-up done during interval.  Denies any new complaints today.  She feels well.  She is at work. Review of Systems  Constitutional: Negative for appetite change, chills, fatigue and fever.  HENT:   Negative for hearing loss and voice change.   Eyes: Negative for eye problems.  Respiratory: Negative for chest tightness and cough.   Cardiovascular: Negative for chest pain.  Gastrointestinal: Negative for abdominal distention, abdominal pain and blood in stool.  Endocrine: Negative for hot flashes.  Genitourinary: Negative for difficulty urinating and frequency.   Musculoskeletal: Negative for arthralgias.  Skin: Negative for itching and rash.  Neurological: Negative for extremity weakness.  Hematological: Negative for adenopathy.  Psychiatric/Behavioral: Negative for confusion.    Past Medical History:  Diagnosis Date  . Diabetes mellitus without complication ([place])   . GERD (gastroesophageal reflux disease)   .  Hyperlipidemia    Past Surgical History:  Procedure Laterality Date  . COLONOSCOPY WITH PROPOFOL N/A 08/23/2017   Procedure: COLONOSCOPY WITH PROPOFOL;  Surgeon: Manya Silvas, MD;  Location[facility] ENDOSCOPY;  Service: Endoscopy;  Laterality: N/A;  . TONSILLECTOMY AND ADENOIDECTOMY  1968    Family History  Problem Relation Age of Onset  . COPD Mother        heavy smoker  . Pulmonary fibrosis Mother   . Fibromyalgia Mother   . COPD Father   . Melanoma Father   . COPD Sister   . COPD Brother   . COPD Sister   . COPD Sister   . Healthy Brother   . Heart failure Maternal Grandmother   . Heart attack Maternal Grandfather   . Cancer Paternal Grandmother        unsure of type, had mets in colon  . Heart attack Paternal Grandfather   . Cancer Paternal Grandfather        unknown  . Breast cancer Neg Hx   . Cervical cancer Neg Hx     Social History   Socioeconomic History  . Marital status: Married    Spouse name: Francee Piccolo  . Number of children: 0  . Years of education: some college  . Highest education level: Not on file  Occupational History  . Occupation: CARE MANGEMENT    Employer: [employer]  Social Needs  . Financial resource strain: Not on file  . Food insecurity    Worry: Not on file    Inability: Not on file  . Transportation needs    Medical: Not on file    Non-medical: Not on file  Tobacco Use  . Smoking status: Never Smoker  .  Smokeless tobacco: Never Used  Substance and Sexual Activity  . Alcohol use: No    Alcohol/week: 0.0 standard drinks  . Drug use: No  . Sexual activity: Not Currently  Lifestyle  . Physical activity    Days per week: Not on file    Minutes per session: Not on file  . Stress: Not on file  Relationships  . Social Herbalist on phone: Not on file    Gets together: Not on file    Attends religious service: Not on file    Active member of club or organization: Not on file    Attends meetings of clubs  or organizations: Not on file    Relationship status: Not on file  . Intimate partner violence    Fear of current or ex partner: Not on file    Emotionally abused: Not on file    Physically abused: Not on file    Forced sexual activity: Not on file  Other Topics Concern  . Not on file  Social History Narrative  . Not on file    Current Outpatient Medications on File Prior to Visit  Medication Sig Dispense Refill  . ACCU-CHEK FASTCLIX LANCETS MISC Use as directed to check blood sugar 1-2 times daily 100 each 3  . apixaban (ELIQUIS) 5 MG TABS tablet Take 1 tablet (5 mg total) by mouth 2 (two) times daily. 60 tablet 3  . dapagliflozin propanediol (FARXIGA) 10 MG TABS tablet Take 10 mg by mouth daily. 90 tablet 3  . ferrous sulfate 325 (65 FE) MG tablet Take by mouth.    Marland Kitchen glucose blood (ACCU-CHEK GUIDE) test strip Use as instructed 100 each 3  . lisinopril (PRINIVIL,ZESTRIL) 5 MG tablet Take 1 tablet (5 mg total) by mouth daily. 90 tablet 3  . loratadine (CLARITIN) 10 MG tablet Take 1 tablet (10 mg total) by mouth daily. 90 tablet 3  . metFORMIN (GLUCOPHAGE) 1000 MG tablet Take 1 tablet (1,000 mg total) by mouth 2 (two) times daily with a meal. 180 tablet 3  . omeprazole (PRILOSEC) 20 MG capsule Take 1 capsule (20 mg total) by mouth daily. 90 capsule 3  . rosuvastatin (CRESTOR) 5 MG tablet Take 1 tablet (5 mg total) by mouth daily. 30 tablet 3   No current facility-administered medications on file prior to visit.     Allergies  Allergen Reactions  . Sulfa Antibiotics Hives       Observations/Objective: There were no vitals filed for this visit. There is no height or weight on file to calculate BMI.  Physical Exam  CBC    Component Value Date/Time   WBC 5.5 03/14/2019 1554   RBC 4.41 03/14/2019 1554   HGB 11.6 (L) 03/14/2019 1554   HGB 11.7 02/27/2019 0815   HCT 37.0 03/14/2019 1554   HCT 34.1 02/27/2019 0815   PLT 112 (L) 03/14/2019 1554   PLT 106 (L) 02/27/2019 0815    MCV 83.9 03/14/2019 1554   MCV 78 (L) 02/27/2019 0815   MCH 26.3 03/14/2019 1554   MCHC 31.4 03/14/2019 1554   RDW 14.1 03/14/2019 1554   RDW 13.7 02/27/2019 0815   LYMPHSABS 2.0 03/14/2019 1554   LYMPHSABS 1.7 02/27/2019 0815   MONOABS 0.3 03/14/2019 1554   EOSABS 0.1 03/14/2019 1554   EOSABS 0.1 02/27/2019 0815   BASOSABS 0.0 03/14/2019 1554   BASOSABS 0.0 02/27/2019 0815    CMP     Component Value Date/Time   NA 142 03/14/2019  1554   NA 138 02/27/2019 0815   K 4.1 03/14/2019 1554   CL 110 03/14/2019 1554   CO2 22 03/14/2019 1554   GLUCOSE 106 (H) 03/14/2019 1554   BUN 15 03/14/2019 1554   BUN 17 02/27/2019 0815   CREATININE 1.03 (H) 03/14/2019 1554   CREATININE 0.81 01/15/2017 1026   CALCIUM 9.5 03/14/2019 1554   PROT 7.8 03/14/2019 1554   PROT 7.1 10/12/2018 1047   ALBUMIN 4.2 03/14/2019 1554   ALBUMIN 4.3 10/12/2018 1047   AST 32 03/14/2019 1554   ALT 24 03/14/2019 1554   ALKPHOS 98 03/14/2019 1554   BILITOT 1.0 03/14/2019 1554   BILITOT 0.7 10/12/2018 1047   GFRNONAA 59 (L) 03/14/2019 1554   GFRNONAA 79 01/15/2017 1026   GFRAA >60 03/14/2019 1554   GFRAA 92 01/15/2017 1026     Assessment and Plan: 1. Thrombocytopenia ([place])   2. Abnormal liver ultrasound   3. Normocytic anemia     #Lab results were reviewed and discussed with patient. She has borderline vitamin B12 level.  I recommend patient to start oral vitamin B12 1000 MCG daily. This may be related to her thrombocytopenia. Other work-up showed normal folate level,Negative hepatitis panel, negative HIV, negative peripheral blood flow cytometry.  Normal immature platelet fraction. Given the chronicity of thrombocytopenia, and patient is currently asymptomatic, I recommend observation for now, monitor blood counts in 3 months.  Future bone marrow biopsy can be considered if platelet count decreases  #Abnormal liver ultrasound.  Ultrasound images were independent reviewed by me and discussed with  patient. Patient hasNormal spleen size.Coarse hepatic echotexture suggesting fatty liver infiltration or  Hepatocellular disease.  Recommend patient to establish care with gastroenterology.  #Normocytic anemia, MCV 83.9.This is her baseline since 1 year ago.  Continue monitor. Check iron panel at the next visit. Follow Up Instructions: Follow-up in 3 months.   I discussed the assessment and treatment plan with the patient. The patient was provided an opportunity to ask questions and all were answered. The patient agreed with the plan and demonstrated an understanding of the instructions.  The patient was advised to call back or seek an in-person evaluation if the symptoms worsen or if the condition fails to improve as anticipated.    Earlie Server, MD 03/21/2019 8:37 PM

## 2019-03-21 NOTE — Progress Notes (Signed)
Patient contacted for telehealth visit. No concerns voiced.

## 2019-03-22 ENCOUNTER — Encounter: Payer: Self-pay | Admitting: Family Medicine

## 2019-03-22 NOTE — Progress Notes (Signed)
Patient here for Shingrix vaccination only.  I did not examine the patient.  I did review his medical history, medications, and allergies and vaccine consent form.  CMA gave vaccination. Patient tolerated well.  Virginia Crews, MD, MPH [facility] 03/22/2019 8:19 AM

## 2019-04-10 DIAGNOSIS — H5213 Myopia, bilateral: Secondary | ICD-10-CM | POA: Diagnosis not present

## 2019-04-10 LAB — HM DIABETES EYE EXAM

## 2019-04-12 ENCOUNTER — Encounter: Payer: Self-pay | Admitting: Family Medicine

## 2019-05-03 ENCOUNTER — Encounter: Payer: Self-pay | Admitting: Family Medicine

## 2019-05-03 ENCOUNTER — Encounter (INDEPENDENT_AMBULATORY_CARE_PROVIDER_SITE_OTHER): Payer: Self-pay

## 2019-05-05 ENCOUNTER — Other Ambulatory Visit: Payer: Self-pay

## 2019-05-05 ENCOUNTER — Encounter (INDEPENDENT_AMBULATORY_CARE_PROVIDER_SITE_OTHER): Payer: Self-pay | Admitting: Vascular Surgery

## 2019-05-05 ENCOUNTER — Ambulatory Visit (INDEPENDENT_AMBULATORY_CARE_PROVIDER_SITE_OTHER): Payer: 59 | Admitting: Vascular Surgery

## 2019-05-05 VITALS — BP 109/74 | HR 76 | Resp 16 | Wt 230.8 lb

## 2019-05-05 DIAGNOSIS — I83812 Varicose veins of left lower extremities with pain: Secondary | ICD-10-CM

## 2019-05-05 DIAGNOSIS — I83813 Varicose veins of bilateral lower extremities with pain: Secondary | ICD-10-CM

## 2019-05-05 NOTE — Progress Notes (Signed)
  Cindy Trujillo is a 64 y.o. female who presents with symptomatic venous reflux  Past Medical History:  Diagnosis Date  . Diabetes mellitus without complication ([place])   . GERD (gastroesophageal reflux disease)   . Hyperlipidemia     Past Surgical History:  Procedure Laterality Date  . COLONOSCOPY WITH PROPOFOL N/A 08/23/2017   Procedure: COLONOSCOPY WITH PROPOFOL;  Surgeon: Manya Silvas, MD;  Location: [facility] ENDOSCOPY;  Service: Endoscopy;  Laterality: N/A;  . TONSILLECTOMY AND ADENOIDECTOMY  1968     Current Outpatient Medications:  .  ACCU-CHEK FASTCLIX LANCETS MISC, Use as directed to check blood sugar 1-2 times daily, Disp: 100 each, Rfl: 3 .  apixaban (ELIQUIS) 5 MG TABS tablet, Take 1 tablet (5 mg total) by mouth 2 (two) times daily., Disp: 60 tablet, Rfl: 3 .  dapagliflozin propanediol (FARXIGA) 10 MG TABS tablet, Take 10 mg by mouth daily., Disp: 90 tablet, Rfl: 3 .  ferrous sulfate 325 (65 FE) MG tablet, Take by mouth., Disp: , Rfl:  .  glucose blood (ACCU-CHEK GUIDE) test strip, Use as instructed, Disp: 100 each, Rfl: 3 .  lisinopril (PRINIVIL,ZESTRIL) 5 MG tablet, Take 1 tablet (5 mg total) by mouth daily., Disp: 90 tablet, Rfl: 3 .  loratadine (CLARITIN) 10 MG tablet, Take 1 tablet (10 mg total) by mouth daily., Disp: 90 tablet, Rfl: 3 .  metFORMIN (GLUCOPHAGE) 1000 MG tablet, Take 1 tablet (1,000 mg total) by mouth 2 (two) times daily with a meal., Disp: 180 tablet, Rfl: 3 .  omeprazole (PRILOSEC) 20 MG capsule, Take 1 capsule (20 mg total) by mouth daily., Disp: 90 capsule, Rfl: 3 .  rosuvastatin (CRESTOR) 5 MG tablet, Take 1 tablet (5 mg total) by mouth daily., Disp: 30 tablet, Rfl: 3 .  vitamin B-12 (CYANOCOBALAMIN) 1000 MCG tablet, Take 1 tablet (1,000 mcg total) by mouth daily., Disp: 90 tablet, Rfl: 1  Allergies  Allergen Reactions  . Sulfa Antibiotics Hives     Varicose veins of leg with pain, bilateral     PLAN: The patient's left lower extremity was  sterilely prepped and draped. The ultrasound machine was used to visualize the saphenous vein throughout its course. A segment in the mid to upper calf was selected for access. The saphenous vein was accessed without difficulty using ultrasound guidance with a micropuncture needle. A 0.018 wire was then placed beyond the saphenofemoral junction and the needle was removed. The 65 cm sheath was then placed over the wire and the wire and dilator were removed. The laser fiber was then placed through the sheath and its tip was placed approximately 4-5 centimeters below the saphenofemoral junction. Tumescent anesthesia was then created with a dilute lidocaine solution. Laser energy was then delivered with constant withdrawal of the sheath and laser fiber. Approximately 1474 joules of energy were delivered over a length of 36 centimeters using a 1470 Hz VenaCure machine at 7 W. Sterile dressings were placed. The patient tolerated the procedure well without obvious complications.   Follow-up in 1 week with post-laser duplex.

## 2019-05-08 ENCOUNTER — Ambulatory Visit (INDEPENDENT_AMBULATORY_CARE_PROVIDER_SITE_OTHER): Payer: 59

## 2019-05-08 ENCOUNTER — Other Ambulatory Visit: Payer: Self-pay

## 2019-05-08 DIAGNOSIS — I83813 Varicose veins of bilateral lower extremities with pain: Secondary | ICD-10-CM

## 2019-05-15 HISTORY — PX: VEIN SURGERY: SHX48

## 2019-05-22 ENCOUNTER — Other Ambulatory Visit: Payer: Self-pay

## 2019-05-22 ENCOUNTER — Ambulatory Visit (INDEPENDENT_AMBULATORY_CARE_PROVIDER_SITE_OTHER): Payer: 59 | Admitting: Family Medicine

## 2019-05-22 DIAGNOSIS — Z23 Encounter for immunization: Secondary | ICD-10-CM

## 2019-05-23 ENCOUNTER — Encounter: Payer: Self-pay | Admitting: Family Medicine

## 2019-05-23 NOTE — Progress Notes (Signed)
Patient here for Shingrix vaccination only.  I did not examine the patient.  I did review his medical history, medications, and allergies and vaccine consent form.  CMA gave vaccination. Patient tolerated well.  Virginia Crews, MD, MPH [facility] 05/23/2019 8:15 AM

## 2019-06-05 ENCOUNTER — Encounter: Payer: 59 | Admitting: Family Medicine

## 2019-06-05 ENCOUNTER — Ambulatory Visit: Payer: Self-pay | Admitting: Family Medicine

## 2019-06-05 ENCOUNTER — Telehealth: Payer: Self-pay

## 2019-06-05 NOTE — Telephone Encounter (Signed)
Copied from [place] [id number]. Topic: Appointment Scheduling - Scheduling Inquiry for Clinic >> Jun 05, 2019 12:37 PM Greggory Keen D wrote: Reason for CRM: Pt called saying she wanted to see Dr. B in person and cancelled her appt today for her 3 month fu.  She would like to be scheduled when Dr. B in back in the offfice,

## 2019-06-05 NOTE — Progress Notes (Signed)
This encounter was created in error - please disregard.

## 2019-06-06 ENCOUNTER — Ambulatory Visit (INDEPENDENT_AMBULATORY_CARE_PROVIDER_SITE_OTHER): Payer: 59 | Admitting: Vascular Surgery

## 2019-06-06 ENCOUNTER — Encounter (INDEPENDENT_AMBULATORY_CARE_PROVIDER_SITE_OTHER): Payer: Self-pay | Admitting: Vascular Surgery

## 2019-06-06 ENCOUNTER — Other Ambulatory Visit: Payer: Self-pay

## 2019-06-06 VITALS — BP 111/75 | HR 92 | Resp 16 | Ht 65.0 in | Wt 231.0 lb

## 2019-06-06 DIAGNOSIS — E785 Hyperlipidemia, unspecified: Secondary | ICD-10-CM | POA: Diagnosis not present

## 2019-06-06 DIAGNOSIS — I83813 Varicose veins of bilateral lower extremities with pain: Secondary | ICD-10-CM

## 2019-06-06 DIAGNOSIS — E1129 Type 2 diabetes mellitus with other diabetic kidney complication: Secondary | ICD-10-CM | POA: Diagnosis not present

## 2019-06-06 DIAGNOSIS — R809 Proteinuria, unspecified: Secondary | ICD-10-CM

## 2019-06-06 DIAGNOSIS — E1169 Type 2 diabetes mellitus with other specified complication: Secondary | ICD-10-CM

## 2019-06-06 NOTE — Assessment & Plan Note (Signed)
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  These can be managed with a combination of conventional sclerotherapy and foam sclerotherapy, likely perform foam sclerotherapy first.  I discussed the risks and benefits of sclerotherapy with the patient.  She is agreeable to proceed.

## 2019-06-06 NOTE — Progress Notes (Signed)
MRN : [id number]  Cindy Trujillo is a 64 y.o. (December 14, 1955) female who presents with chief complaint of  Chief Complaint  Patient presents with  . Follow-up    4 wks post laser   .  History of Present Illness: Patient returns today in follow up of her venous insufficiency.  About a month ago, she underwent left great saphenous vein laser ablation.  She had significant soreness and bruising which is largely resolved.  Her varicosities have decreased in size but she continues to have painful varicosities particularly around the knee and calf area.  Post laser duplex showed a successful ablation without DVT but there were large branches still present.  Current Outpatient Medications  Medication Sig Dispense Refill  . ACCU-CHEK FASTCLIX LANCETS MISC Use as directed to check blood sugar 1-2 times daily 100 each 3  . apixaban (ELIQUIS) 5 MG TABS tablet Take 1 tablet (5 mg total) by mouth 2 (two) times daily. 60 tablet 3  . dapagliflozin propanediol (FARXIGA) 10 MG TABS tablet Take 10 mg by mouth daily. 90 tablet 3  . ferrous sulfate 325 (65 FE) MG tablet Take by mouth.    Marland Kitchen glucose blood (ACCU-CHEK GUIDE) test strip Use as instructed 100 each 3  . lisinopril (PRINIVIL,ZESTRIL) 5 MG tablet Take 1 tablet (5 mg total) by mouth daily. 90 tablet 3  . loratadine (CLARITIN) 10 MG tablet Take 1 tablet (10 mg total) by mouth daily. 90 tablet 3  . metFORMIN (GLUCOPHAGE) 1000 MG tablet Take 1 tablet (1,000 mg total) by mouth 2 (two) times daily with a meal. 180 tablet 3  . omeprazole (PRILOSEC) 20 MG capsule Take 1 capsule (20 mg total) by mouth daily. 90 capsule 3  . rosuvastatin (CRESTOR) 5 MG tablet Take 1 tablet (5 mg total) by mouth daily. 30 tablet 3  . vitamin B-12 (CYANOCOBALAMIN) 1000 MCG tablet Take 1 tablet (1,000 mcg total) by mouth daily. 90 tablet 1   No current facility-administered medications for this visit.    Past Medical History:  Diagnosis Date  . Diabetes mellitus without  complication ([place])   . GERD (gastroesophageal reflux disease)   . Hyperlipidemia     Past Surgical History:  Procedure Laterality Date  . COLONOSCOPY WITH PROPOFOL N/A 08/23/2017   Procedure: COLONOSCOPY WITH PROPOFOL;  Surgeon: Manya Silvas, MD;  Location[facility] ENDOSCOPY;  Service: Endoscopy;  Laterality: N/A;  . TONSILLECTOMY AND ADENOIDECTOMY  1968     Social History   Tobacco Use  . Smoking status: Never Smoker  . Smokeless tobacco: Never Used  Substance Use Topics  . Alcohol use: No    Alcohol/week: 0.0 standard drinks  . Drug use: No    Family History  Problem Relation Age of Onset  . COPD Mother        heavy smoker  . Pulmonary fibrosis Mother   . Fibromyalgia Mother   . COPD Father   . Melanoma Father   . COPD Sister   . COPD Brother   . COPD Sister   . COPD Sister   . Healthy Brother   . Heart failure Maternal Grandmother   . Heart attack Maternal Grandfather   . Cancer Paternal Grandmother        unsure of type, had mets in colon  . Heart attack Paternal Grandfather   . Cancer Paternal Grandfather        unknown  . Breast cancer Neg Hx   . Cervical cancer Neg Hx  Allergies  Allergen Reactions  . Sulfa Antibiotics Hives    REVIEW OF SYSTEMS (Negative unless checked)  Constitutional: [] ?Weight loss  [] ?Fever  [] ?Chills Cardiac: [] ?Chest pain   [] ?Chest pressure   [] ?Palpitations   [] ?Shortness of breath when laying flat   [] ?Shortness of breath at rest   [] ?Shortness of breath with exertion. Vascular:  [x] ?Pain in legs with walking   [x] ?Pain in legs at rest   [] ?Pain in legs when laying flat   [] ?Claudication   [] ?Pain in feet when walking  [] ?Pain in feet at rest  [] ?Pain in feet when laying flat   [] ?History of DVT   [x] ?Phlebitis   [x] ?Swelling in legs   [] ?Varicose veins   [] ?Non-healing ulcers Pulmonary:   [] ?Uses home oxygen   [] ?Productive cough   [] ?Hemoptysis   [] ?Wheeze  [] ?COPD   [] ?Asthma Neurologic:  [] ?Dizziness  [] ?Blackouts    [] ?Seizures   [] ?History of stroke   [] ?History of TIA  [] ?Aphasia   [] ?Temporary blindness   [] ?Dysphagia   [] ?Weakness or numbness in arms   [] ?Weakness or numbness in legs Musculoskeletal:  [x] ?Arthritis   [] ?Joint swelling   [] ?Joint pain   [] ?Low back pain Hematologic:  [] ?Easy bruising  [] ?Easy bleeding   [] ?Hypercoagulable state   [] ?Anemic   Gastrointestinal:  [] ?Blood in stool   [] ?Vomiting blood  [] ?Gastroesophageal reflux/heartburn   [] ?Abdominal pain Genitourinary:  [] ?Chronic kidney disease   [] ?Difficult urination  [] ?Frequent urination  [] ?Burning with urination   [] ?Hematuria Skin:  [] ?Rashes   [] ?Ulcers   [] ?Wounds Psychological:  [] ?History of anxiety   [] ? History of major depression.  Physical Examination  BP 111/75 (BP Location: Left Arm)   Pulse 92   Resp 16   Ht 5' 5"  (1.651 m)   Wt 231 lb (104.8 kg)   BMI 38.44 kg/m  Gen:  WD/WN, NAD Head: [place]/AT, No temporalis wasting. Ear/Nose/Throat: Hearing grossly intact, nares w/o erythema or drainage Eyes: Conjunctiva clear. Sclera non-icteric Neck: Supple.  Trachea midline Pulmonary:  Good air movement, no use of accessory muscles.  Cardiac: RRR, no JVD Vascular:  Vessel Right Left  Radial Palpable Palpable                          PT Palpable Palpable  DP Palpable Palpable   Gastrointestinal: soft, non-tender/non-distended. No guarding/reflex.  Musculoskeletal: M/S 5/5 throughout.  No deformity or atrophy.  Prominent varicosities around the medial calf and knee area although smaller than before.  Now measuring 1-2 mm or so.  Fairly prominent spider varicosities are also present around the ankle and lower leg.  Mild left lower extremity edema. Neurologic: Sensation grossly intact in extremities.  Symmetrical.  Speech is fluent.  Psychiatric: Judgment intact, Mood & affect appropriate for pt's clinical situation. Dermatologic: No rashes or ulcers noted.  No cellulitis or open wounds.       Labs Recent  Results (from the past 2160 hour(s))  Flow cytometry panel-leukemia/lymphoma work-up     Status: None   Collection Time: 03/14/19  3:54 PM  Result Value Ref Range   PATH INTERP XXX-IMP Comment     Comment: No significant immunophenotypic abnormality detected   CLINICAL INFO Comment     Comment: (NOTE) Accompanying CBC dated 03/14/2019 shows: WBC count 5.5, Hgb 11.6, Plt 112, Neu 3.1, Lym 2.0, Mon 0.3.    Specimen Type Comment     Comment: Peripheral blood   ASSESSMENT OF LEUKOCYTES Comment     Comment: (NOTE)  No monoclonal B cell population is detected. kappa:lambda ratio 1.6 There is no loss of, or aberrant expression of, the pan T cell antigens to suggest a neoplastic T cell process. An increased CD4/T helper to CD8/T suppressor cell ratio is detected. CD4:CD8 ratio 12.9 No circulating blasts are detected. There is no immunophenotypic  evidence of abnormal myeloid maturation. Mature monocytes show aberrant expression of CD56, a finding that can be seen in association with both reactive/activated processes as well as neoplastic processes. Analysis of the leukocyte population shows: granulocytes 63%, monocytes 5%, lymphocytes 32%, blasts <0.1%, B cells 6%, T cells 25%, NK cells 1%.    % Viable Cells Comment     Comment: 93%   ANALYSIS AND GATING STRATEGY Comment     Comment: 8 color analysis with CD45/SSC gating   IMMUNOPHENOTYPING STUDY Comment     Comment: (NOTE) CD2       Normal         CD3       Normal CD4       Normal         CD5       Normal CD7       Normal         CD8       Normal CD10      Normal         CD11b     Normal CD13      Normal         CD14      Normal CD16      Normal         CD19      Normal CD20      Normal         CD33      Normal CD34      Normal         CD38      Normal CD45      Normal         CD56      Normal CD57      Normal         CD117     Normal HLA-DR    Normal         KAPPA     Normal LAMBDA    Normal         CD64      Normal     PATHOLOGIST NAME Comment     Comment: Henrietta Hoover, M.D.   COMMENT: Comment     Comment: (NOTE) Each antibody in this assay was utilized to assess for potential abnormalities of studied cell populations or to characterize identified abnormalities. This test was developed and its performance characteristics determined by [facility].  It has not been cleared or approved by the U.S. Food and Drug Administration. The FDA has determined that such clearance or approval is not necessary. This test is used for clinical purposes.  It should not be regarded as investigational or for research. Performed At: -[facility] [address] [place], [place] [id number] Katina Degree MDPhD [id number] Performed At: [facility] [address] [place], [place] [id number] Katina Degree MDPhD [id number]   Folate     Status: None   Collection Time: 03/14/19  3:54 PM  Result Value Ref Range   Folate 10.5 >5.9 ng/mL    Comment: Performed at [facility], [name] [place], [place] [address]  Vitamin B12     Status:  None   Collection Time: 03/14/19  3:54 PM  Result Value Ref Range   Vitamin B-12 195 180 - 914 pg/mL    Comment: (NOTE) This assay is not validated for testing neonatal or myeloproliferative syndrome specimens for Vitamin B12 levels. Performed at [facility], [place] [address], [place], [place] [address]   Immature Platelet Fraction     Status: None   Collection Time: 03/14/19  3:54 PM  Result Value Ref Range   Immature Platelet Fraction 2.6 1.2 - 8.6 %    Comment: Performed at [facility], [address] [place], [place] [address]  HIV antibody     Status: None   Collection Time: 03/14/19  3:54 PM  Result Value Ref Range   HIV Screen 4th Generation wRfx NON REACTIVE NON REACTIVE    Comment: Performed at [facility], [place] [address], [place], [place] [address]  Hepatitis panel, acute     Status: None   Collection Time: 03/14/19  3:54 PM   Result Value Ref Range   Hepatitis B Surface Ag NON REACTIVE NON REACTIVE   HCV Ab NON REACTIVE NON REACTIVE    Comment: (NOTE) Nonreactive HCV antibody screen is consistent with no HCV infections,  unless recent infection is suspected or other evidence exists to indicate HCV infection.    Hep A IgM NON REACTIVE NON REACTIVE   Hep B C IgM NON REACTIVE NON REACTIVE    Comment: Performed at [facility], [place] [address] [place], [place] [address]  Comprehensive metabolic panel     Status: Abnormal   Collection Time: 03/14/19  3:54 PM  Result Value Ref Range   Sodium 142 135 - 145 mmol/L   Potassium 4.1 3.5 - 5.1 mmol/L   Chloride 110 98 - 111 mmol/L   CO2 22 22 - 32 mmol/L   Glucose, Bld 106 (H) 70 - 99 mg/dL   BUN 15 8 - 23 mg/dL   Creatinine, Ser 1.03 (H) 0.44 - 1.00 mg/dL   Calcium 9.5 8.9 - 10.3 mg/dL   Total Protein 7.8 6.5 - 8.1 g/dL   Albumin 4.2 3.5 - 5.0 g/dL   AST 32 15 - 41 U/L   ALT 24 0 - 44 U/L   Alkaline Phosphatase 98 38 - 126 U/L   Total Bilirubin 1.0 0.3 - 1.2 mg/dL   GFR calc non Af Amer 59 (L) >60 mL/min   GFR calc Af Amer >60 >60 mL/min   Anion gap 10 5 - 15    Comment: Performed at [facility], [address] [place], [place] [address]  CBC with Differential/Platelet     Status: Abnormal   Collection Time: 03/14/19  3:54 PM  Result Value Ref Range   WBC 5.5 4.0 - 10.5 K/uL   RBC 4.41 3.87 - 5.11 MIL/uL   Hemoglobin 11.6 (L) 12.0 - 15.0 g/dL   HCT 37.0 36.0 - 46.0 %   MCV 83.9 80.0 - 100.0 fL   MCH 26.3 26.0 - 34.0 pg   MCHC 31.4 30.0 - 36.0 g/dL   RDW 14.1 11.5 - 15.5 %   Platelets 112 (L) 150 - 400 K/uL   nRBC 0.0 0.0 - 0.2 %   Neutrophils Relative % 57 %   Neutro Abs 3.1 1.7 - 7.7 K/uL   Lymphocytes Relative 36 %   Lymphs Abs 2.0 0.7 - 4.0 K/uL   Monocytes Relative 5 %   Monocytes Absolute 0.3 0.1 -  1.0 K/uL   Eosinophils Relative 2 %   Eosinophils Absolute 0.1 0.0 - 0.5 K/uL   Basophils Relative 0 %   Basophils Absolute  0.0 0.0 - 0.1 K/uL   Immature Granulocytes 0 %   Abs Immature Granulocytes 0.01 0.00 - 0.07 K/uL    Comment: Performed at [facility], [address] [place], [place] [address]  Technologist smear review     Status: None   Collection Time: 03/14/19  3:54 PM  Result Value Ref Range   WBC Morphology MORPHOLOGY UNREMARKABLE    RBC Morphology RBC MORPHOLOGY NORMAL    Tech Review PLATELETS APPEAR DECREASED     Comment: Normal platelet morphology Performed at [facility], [address] [place], [place] [address]   HM DIABETES EYE EXAM     Status: None   Collection Time: 04/10/19 12:00 AM  Result Value Ref Range   HM Diabetic Eye Exam No Retinopathy No Retinopathy    Radiology VAS [place] LASER ABLATION OF SUPERFICIAL VEI  Result Date: 05/09/2019  Lower Venous Study Other Indications: Post left ablation. Performing Technologist: Concha Norway RVT  Examination Guidelines: A complete evaluation includes B-mode imaging, spectral Doppler, color Doppler, and power Doppler as needed of all accessible portions of each vessel. Bilateral testing is considered an integral part of a complete examination. Limited examinations for reoccurring indications may be performed as noted.  +------+---------------+---------+-----------+----------+--------------+ LEFT  CompressibilityPhasicitySpontaneityPropertiesThrombus Aging +------+---------------+---------+-----------+----------+--------------+ CFV   Full           Yes      Yes                                 +------+---------------+---------+-----------+----------+--------------+ SFJ   Full                                                        +------+---------------+---------+-----------+----------+--------------+ FV MidFull           Yes      Yes                                 +------+---------------+---------+-----------+----------+--------------+ POP   Full           Yes      Yes                                  +------+---------------+---------+-----------+----------+--------------+     Summary: Left: Successful vein closure.There is no evidence of deep vein thrombosis in the lower extremity. However, portions of this examination were limited- see technologist comments above. There is an additional vessel running parallel to the closed GSV that also extends to the SFJ suggesting an accessory GSV. It appears to feed a large varicosity at the knee which feeds superficially to multiple varicosities at the knee distally to the calf.  *See table(s) above for measurements and observations. Electronically signed by Leotis Pain MD on 05/09/2019 at 3:22:25 PM.    Final     Assessment/Plan T2DM (type 2 diabetes mellitus) ([place]) blood glucose control important in reducing the progression of atherosclerotic disease. Also, involved in wound healing. On appropriate medications.   Hyperlipidemia associated with  type 2 diabetes mellitus (HCC) lipid control important in reducing the progression of atherosclerotic disease. Continue statin therapy  Varicose veins of leg with pain, bilateral The patient is undergone successful laser ablation of the left great saphenous vein but does have residual painful varicosities in the left leg.  These can be managed with a combination of conventional sclerotherapy and foam sclerotherapy, likely perform foam sclerotherapy first.  I discussed the risks and benefits of sclerotherapy with the patient.  She is agreeable to proceed.    Leotis Pain, MD  06/06/2019 4:54 PM    This note was created with Dragon medical transcription system.  Any errors from dictation are purely unintentional

## 2019-06-06 NOTE — Patient Instructions (Signed)
Sclerotherapy  Sclerotherapy is a procedure that is done to improve the appearance of varicose veins and spider veins and to help relieve aching, swelling, cramping, and pain in the legs. Varicose veins are veins that have become enlarged, bulging, and twisted due to a damaged valve that causes blood to collect (pool) in the veins. Spider veins are small varicose veins. Sclerotherapy usually works best for smaller spider and varicose veins. This procedure involves injecting a chemical into the vein to close it off. You may need more than one treatment to close a vein all the way. Sclerotherapy is usually performed on the legs because that is where varicose and spider veins most often occur. Tell a health care provider about:  Any allergies you have.  All medicines you are taking, including vitamins, herbs, eye drops, creams, and over-the-counter medicines.  Any blood disorders you have.  Any surgeries you have had.  Any medical conditions you have.  Whether you are pregnant or may be pregnant. What are the risks? Generally, this is a safe procedure. However, problems may occur, including:  Infection.  Bleeding.  Allergic reactions to medicines or dyes.  Blood clots.  Nerve damage.  Bruising and scarring.  Darkened skin around the area. What happens before the procedure?  Do not use lotions or creams on your legs unless your health care provider approves.  Follow instructions from your health care provider about eating and drinking restrictions.  Do not use any products that contain nicotine or tobacco, such as cigarettes and e-cigarettes. If you need help quitting, ask your health care provider.  Ask your health care provider about: ? Changing or stopping your regular medicines. This is especially important if you are taking diabetes medicines or blood thinners. ? Taking medicines such as aspirin and ibuprofen. These medicines can thin your blood. Do not take these  medicines before your procedure if your health care provider instructs you not to.  You may have an ultrasound of the affected area to check for blood clots and to check blood flow.  In rare cases, you may have an X-ray procedure to check how blood flows through your veins (angiogram). For an angiogram, a dye is injected to outline your veins on X-rays. What happens during the procedure?  To lower your risk of infection: ? Your health care team will wash or sanitize their hands. ? Your skin will be washed with soap. ? Hair may be removed from the treatment area.  A small, thin needle will be used to inject a chemical (sclerosant) into your varicose vein. The sclerosant will irritate the lining of the vein and cause the vein to close below the injection site. You may feel some stinging, burning, or irritation.  The injection may be repeated for more than one varicose vein.  The injection area will be wrapped with elastic bandages. The procedure may vary among health care providers and hospitals. What happens after the procedure?  Your injection area will be wrapped with elastic bandages. If there is bleeding, the bandages may be changed.  Do not drive until your health care provider approves. You may need to wait 1-2 days before driving.  You will need to wear compression stockings for about a week, or as long as your health care provider recommends. Summary  Sclerotherapy is a procedure that is done to improve the appearance of varicose veins and spider veins and to help relieve aching, swelling, cramping, and pain in the legs.  A small, thin needle is  used to inject a chemical (sclerosant) into a spider vein or varicose vein to close it off.  Elastic bandages will be wrapped around the injection area after the procedure. This information is not intended to replace advice given to you by your health care provider. Make sure you discuss any questions you have with your health care  provider. Document Revised: 07/22/2018 Document Reviewed: 05/19/2016 Elsevier Patient Education  [place].

## 2019-06-26 ENCOUNTER — Ambulatory Visit: Payer: 59 | Admitting: Family Medicine

## 2019-06-26 ENCOUNTER — Other Ambulatory Visit: Payer: Self-pay | Admitting: Family Medicine

## 2019-06-26 ENCOUNTER — Other Ambulatory Visit: Payer: Self-pay

## 2019-06-26 ENCOUNTER — Encounter: Payer: Self-pay | Admitting: Family Medicine

## 2019-06-26 VITALS — BP 109/75 | Temp 97.1°F | Resp 16 | Ht 65.0 in | Wt 231.0 lb

## 2019-06-26 DIAGNOSIS — I82411 Acute embolism and thrombosis of right femoral vein: Secondary | ICD-10-CM

## 2019-06-26 DIAGNOSIS — R809 Proteinuria, unspecified: Secondary | ICD-10-CM | POA: Diagnosis not present

## 2019-06-26 DIAGNOSIS — E785 Hyperlipidemia, unspecified: Secondary | ICD-10-CM

## 2019-06-26 DIAGNOSIS — E1129 Type 2 diabetes mellitus with other diabetic kidney complication: Secondary | ICD-10-CM

## 2019-06-26 DIAGNOSIS — I83813 Varicose veins of bilateral lower extremities with pain: Secondary | ICD-10-CM

## 2019-06-26 DIAGNOSIS — E1169 Type 2 diabetes mellitus with other specified complication: Secondary | ICD-10-CM

## 2019-06-26 LAB — POCT GLYCOSYLATED HEMOGLOBIN (HGB A1C): Hemoglobin A1C: 6.9 % — AB (ref 4.0–5.6)

## 2019-06-26 MED ORDER — FREESTYLE TEST VI STRP: ORAL_STRIP | 12 refills | Status: DC

## 2019-06-26 MED ORDER — APIXABAN 5 MG PO TABS: 5.0000 mg | ORAL_TABLET | Freq: Two times a day (BID) | ORAL | 3 refills | Status: DC

## 2019-06-26 MED ORDER — FREESTYLE UNISTICK II LANCETS MISC: 5 refills | Status: DC

## 2019-06-26 MED ORDER — FREESTYLE SYSTEM KIT: 1.0000 | PACK | 0 refills | Status: DC | PRN

## 2019-06-26 MED ORDER — LISINOPRIL 5 MG PO TABS: 5.0000 mg | ORAL_TABLET | Freq: Every day | ORAL | 3 refills | Status: DC

## 2019-06-26 MED ORDER — DAPAGLIFLOZIN PROPANEDIOL 10 MG PO TABS: 10.0000 mg | ORAL_TABLET | Freq: Every day | ORAL | 3 refills | Status: DC

## 2019-06-26 MED ORDER — METFORMIN HCL 1000 MG PO TABS: 1000.0000 mg | ORAL_TABLET | Freq: Two times a day (BID) | ORAL | 3 refills | Status: DC

## 2019-06-26 NOTE — Assessment & Plan Note (Signed)
Discussed importance of healthy weight management Discussed diet and exercise  

## 2019-06-26 NOTE — Assessment & Plan Note (Signed)
Patient continues to have L leg painful varicosities Advised to f/u with [facility] From chart review appears plan from Dr Lucky Cowboy was to try foam sclerotherapy

## 2019-06-26 NOTE — Assessment & Plan Note (Signed)
Continue lisinopril at low dose

## 2019-06-26 NOTE — Assessment & Plan Note (Signed)
Did not tolerate Lipitor due to myalgias Tolerating Crestor well Recheck CMP and FLP

## 2019-06-26 NOTE — Progress Notes (Signed)
Patient: Cindy Trujillo Female    DOB: 01-25-1956   64 y.o.   MRN: [id number] Visit Date: 06/26/2019  Today's Provider: Lavon Paganini, MD   Chief Complaint  Patient presents with  . Diabetes   Subjective:     HPI  Diabetes Mellitus Type II, Follow-up:   Lab Results  Component Value Date   HGBA1C 6.7 (H) 02/27/2019   HGBA1C 6.7 (H) 10/12/2018   HGBA1C 7.2 (A) 06/23/2018    Last seen for diabetes 4 months ago.  Management since then includes no changes. She reports excellent compliance with treatment. She is not having side effects.  Current symptoms include none and have been stable. Home blood sugar records: fasting range: 110  Episodes of hypoglycemia? no   Current insulin regiment: Is not on insulin Most Recent Eye Exam: UTD Weight trend: stable Prior visit with dietician: No Current exercise: none Current diet habits: in general, an "unhealthy" diet  Pertinent Labs:    Component Value Date/Time   CHOL 214 (H) 10/12/2018 1047   TRIG 287 (H) 10/12/2018 1047   HDL 47 10/12/2018 1047   LDLCALC 110 (H) 10/12/2018 1047   LDLCALC 119 (H) 01/15/2017 1026   CREATININE 1.03 (H) 03/14/2019 1554   CREATININE 0.81 01/15/2017 1026    Wt Readings from Last 3 Encounters:  06/26/19 231 lb (104.8 kg)  06/06/19 231 lb (104.8 kg)  05/05/19 230 lb 12.8 oz (104.7 kg)    ------------------------------------------------------------------------  Allergies  Allergen Reactions  . Sulfa Antibiotics Hives     Current Outpatient Medications:  .  apixaban (ELIQUIS) 5 MG TABS tablet, Take 1 tablet (5 mg total) by mouth 2 (two) times daily., Disp: 60 tablet, Rfl: 3 .  dapagliflozin propanediol (FARXIGA) 10 MG TABS tablet, Take 10 mg by mouth daily., Disp: 90 tablet, Rfl: 3 .  ferrous sulfate 325 (65 FE) MG tablet, Take by mouth., Disp: , Rfl:  .  lisinopril (PRINIVIL,ZESTRIL) 5 MG tablet, Take 1 tablet (5 mg total) by mouth daily., Disp: 90 tablet, Rfl: 3 .   loratadine (CLARITIN) 10 MG tablet, Take 1 tablet (10 mg total) by mouth daily., Disp: 90 tablet, Rfl: 3 .  metFORMIN (GLUCOPHAGE) 1000 MG tablet, Take 1 tablet (1,000 mg total) by mouth 2 (two) times daily with a meal., Disp: 180 tablet, Rfl: 3 .  omeprazole (PRILOSEC) 20 MG capsule, Take 1 capsule (20 mg total) by mouth daily., Disp: 90 capsule, Rfl: 3 .  rosuvastatin (CRESTOR) 5 MG tablet, Take 1 tablet (5 mg total) by mouth daily., Disp: 30 tablet, Rfl: 3 .  vitamin B-12 (CYANOCOBALAMIN) 1000 MCG tablet, Take 1 tablet (1,000 mcg total) by mouth daily., Disp: 90 tablet, Rfl: 1 .  ACCU-CHEK FASTCLIX LANCETS MISC, Use as directed to check blood sugar 1-2 times daily, Disp: 100 each, Rfl: 3 .  glucose blood (ACCU-CHEK GUIDE) test strip, Use as instructed, Disp: 100 each, Rfl: 3  Review of Systems  Constitutional: Negative.   Eyes: Negative.   Respiratory: Negative.   Cardiovascular: Negative.   Neurological: Negative.     Social History   Tobacco Use  . Smoking status: Never Smoker  . Smokeless tobacco: Never Used  Substance Use Topics  . Alcohol use: No    Alcohol/week: 0.0 standard drinks      Objective:   BP 109/75 (BP Location: Left Arm, Patient Position: Sitting, Cuff Size: Large)   Temp (!) 97.1 F (36.2 C) (Temporal)   Resp 16  Ht 5' 5"  (1.651 m)   Wt 231 lb (104.8 kg)   BMI 38.44 kg/m  Vitals:   06/26/19 0811  BP: 109/75  Resp: 16  Temp: (!) 97.1 F (36.2 C)  TempSrc: Temporal  Weight: 231 lb (104.8 kg)  Height: 5' 5"  (1.651 m)  Body mass index is 38.44 kg/m.   Physical Exam Vitals reviewed.  Constitutional:      General: She is not in acute distress.    Appearance: Normal appearance. She is well-developed. She is not diaphoretic.  HENT:     Head: Normocephalic and atraumatic.     Right Ear: External ear normal.     Left Ear: External ear normal.  Eyes:     General: No scleral icterus.    Conjunctiva/sclera: Conjunctivae normal.     Pupils: Pupils  are equal, round, and reactive to light.  Neck:     Thyroid: No thyromegaly.  Cardiovascular:     Rate and Rhythm: Normal rate and regular rhythm.     Pulses: Normal pulses.     Heart sounds: Normal heart sounds. No murmur.  Pulmonary:     Effort: Pulmonary effort is normal. No respiratory distress.     Breath sounds: Normal breath sounds. No wheezing or rales.  Abdominal:     General: Bowel sounds are normal. There is no distension.     Palpations: Abdomen is soft.     Tenderness: There is no abdominal tenderness. There is no guarding or rebound.  Musculoskeletal:        General: No deformity.     Cervical back: Neck supple.     Right lower leg: No edema.     Left lower leg: Edema (trace) present.  Lymphadenopathy:     Cervical: No cervical adenopathy.  Skin:    General: Skin is warm and dry.     Capillary Refill: Capillary refill takes less than 2 seconds.     Findings: No rash.  Neurological:     Mental Status: She is alert and oriented to person, place, and time. Mental status is at baseline.  Psychiatric:        Mood and Affect: Mood normal.        Behavior: Behavior normal.        Thought Content: Thought content normal.    Diabetic Foot Exam - Simple   Simple Foot Form Diabetic Foot exam was performed with the following findings: Yes 06/26/2019  8:57 AM  Visual Inspection No deformities, no ulcerations, no other skin breakdown bilaterally: Yes Sensation Testing Intact to touch and monofilament testing bilaterally: Yes Pulse Check Posterior Tibialis and Dorsalis pulse intact bilaterally: Yes Comments      No results found for any visits on 06/26/19.     Assessment & Plan    Problem List Items Addressed This Visit      Cardiovascular and Mediastinum   Varicose veins of leg with pain, bilateral    Patient continues to have L leg painful varicosities Advised to f/u with [facility] From chart review appears plan from Dr Lucky Cowboy was to try foam sclerotherapy       Relevant Medications   lisinopril (ZESTRIL) 5 MG tablet   apixaban (ELIQUIS) 5 MG TABS tablet   Acute deep vein thrombosis (DVT) of femoral vein of right lower extremity (HCC)   Relevant Medications   lisinopril (ZESTRIL) 5 MG tablet   apixaban (ELIQUIS) 5 MG TABS tablet     Endocrine   T2DM (type 2 diabetes mellitus) (  [place]) - Primary    Chronic and well controlled With microalbuminuria - continue lisinopril Continue statin Continue current meds Foot exam today UTD on other screenigns and vaccines F/u in 3-6 months      Relevant Medications   lisinopril (ZESTRIL) 5 MG tablet   metFORMIN (GLUCOPHAGE) 1000 MG tablet   dapagliflozin propanediol (FARXIGA) 10 MG TABS tablet   Other Relevant Orders   POCT glycosylated hemoglobin (Hb A1C) (Completed)   Lipid panel   Comprehensive metabolic panel   Hyperlipidemia associated with type 2 diabetes mellitus ([place])    Did not tolerate Lipitor due to myalgias Tolerating Crestor well Recheck CMP and FLP      Relevant Medications   lisinopril (ZESTRIL) 5 MG tablet   metFORMIN (GLUCOPHAGE) 1000 MG tablet   dapagliflozin propanediol (FARXIGA) 10 MG TABS tablet   Other Relevant Orders   Lipid panel   Comprehensive metabolic panel   Microalbuminuria due to type 2 diabetes mellitus (HCC)    Continue lisinopril at low dose      Relevant Medications   lisinopril (ZESTRIL) 5 MG tablet   metFORMIN (GLUCOPHAGE) 1000 MG tablet   dapagliflozin propanediol (FARXIGA) 10 MG TABS tablet     Other   Morbid obesity (HCC)    Discussed importance of healthy weight management Discussed diet and exercise       Relevant Medications   metFORMIN (GLUCOPHAGE) 1000 MG tablet   dapagliflozin propanediol (FARXIGA) 10 MG TABS tablet   Other Relevant Orders   Lipid panel   Comprehensive metabolic panel       Return in about 3 months (around 09/26/2019) for CPE, as scheduled.   The entirety of the information documented in the History of Present  Illness, Review of Systems and Physical Exam were personally obtained by me. Portions of this information were initially documented by Lynford Humphrey, CMA and reviewed by me for thoroughness and accuracy.    Riku Buttery, Dionne Bucy, MD MPH [facility]

## 2019-06-26 NOTE — Assessment & Plan Note (Signed)
Chronic and well controlled With microalbuminuria - continue lisinopril Continue statin Continue current meds Foot exam today UTD on other screenigns and vaccines F/u in 3-6 months

## 2019-06-27 ENCOUNTER — Telehealth: Payer: Self-pay

## 2019-06-27 LAB — LIPID PANEL
Chol/HDL Ratio: 2.7 ratio (ref 0.0–4.4)
Cholesterol, Total: 137 mg/dL (ref 100–199)
HDL: 51 mg/dL (ref >39)
LDL Chol Calc (NIH): 51 mg/dL (ref 0–99)
Triglycerides: 220 mg/dL — ABNORMAL HIGH (ref 0–149)
VLDL Cholesterol Cal: 35 mg/dL (ref 5–40)

## 2019-06-27 LAB — COMPREHENSIVE METABOLIC PANEL
ALT: 20 IU/L (ref 0–32)
AST: 32 IU/L (ref 0–40)
Albumin/Globulin Ratio: 1.5 (ref 1.2–2.2)
Albumin: 4 g/dL (ref 3.8–4.8)
Alkaline Phosphatase: 92 IU/L (ref 39–117)
BUN/Creatinine Ratio: 13 (ref 12–28)
BUN: 15 mg/dL (ref 8–27)
Bilirubin Total: 0.8 mg/dL (ref 0.0–1.2)
CO2: 19 mmol/L — ABNORMAL LOW (ref 20–29)
Calcium: 9.3 mg/dL (ref 8.7–10.3)
Chloride: 108 mmol/L — ABNORMAL HIGH (ref 96–106)
Creatinine, Ser: 1.13 mg/dL — ABNORMAL HIGH (ref 0.57–1.00)
GFR calc Af Amer: 61 mL/min/{1.73_m2} (ref >59)
GFR calc non Af Amer: 53 mL/min/{1.73_m2} — ABNORMAL LOW (ref >59)
Globulin, Total: 2.7 g/dL (ref 1.5–4.5)
Glucose: 136 mg/dL — ABNORMAL HIGH (ref 65–99)
Potassium: 4.2 mmol/L (ref 3.5–5.2)
Sodium: 141 mmol/L (ref 134–144)
Total Protein: 6.7 g/dL (ref 6.0–8.5)

## 2019-06-27 NOTE — Telephone Encounter (Signed)
-----   Message from Virginia Crews, MD sent at 06/27/2019  9:45 AM EDT ----- Normal/stable labs

## 2019-06-27 NOTE — Telephone Encounter (Signed)
Left message on voicemail to return call for results.

## 2019-06-27 NOTE — Telephone Encounter (Signed)
LMTCB, [facility] may give results.

## 2019-07-13 ENCOUNTER — Other Ambulatory Visit: Payer: Self-pay | Admitting: Family Medicine

## 2019-08-22 ENCOUNTER — Encounter: Payer: Self-pay | Admitting: Family Medicine

## 2019-09-18 ENCOUNTER — Other Ambulatory Visit: Payer: Self-pay | Admitting: Family Medicine

## 2019-09-18 NOTE — Telephone Encounter (Signed)
Requested Prescriptions  Pending Prescriptions Disp Refills  . omeprazole (PRILOSEC) 20 MG capsule [Pharmacy Med Name: OMEPRAZOLE 20 MG CPDR 20 Capsule] 90 capsule 3    Sig: TAKE 1 CAPSULE (20 MG TOTAL) BY MOUTH DAILY.     Gastroenterology: Proton Pump Inhibitors Passed - 09/18/2019  7:49 AM      Passed - Valid encounter within last 12 months    Recent Outpatient Visits          2 months ago Type 2 diabetes mellitus with microalbuminuria, without long-term current use of insulin [facility])   [facility], Dionne Bucy, MD   3 months ago Need for shingles vaccine   [facility], Dionne Bucy, MD   6 months ago Need for shingles vaccine   [facility], Dionne Bucy, MD   6 months ago Type 2 diabetes mellitus with microalbuminuria, without long-term current use of insulin [facility])   [facility], Dionne Bucy, MD   11 months ago Encounter for annual physical exam   [facility], Dionne Bucy, MD

## 2019-09-21 ENCOUNTER — Other Ambulatory Visit (HOSPITAL_COMMUNITY)
Admission: RE | Admit: 2019-09-21 | Discharge: 2019-09-21 | Disposition: A | Discharge status: Home / Self Care | Payer: 59 | Source: Ambulatory Visit | Attending: Family Medicine | Admitting: Family Medicine

## 2019-09-21 ENCOUNTER — Telehealth: Payer: Self-pay | Admitting: Obstetrics & Gynecology

## 2019-09-21 ENCOUNTER — Ambulatory Visit (INDEPENDENT_AMBULATORY_CARE_PROVIDER_SITE_OTHER): Payer: 59 | Admitting: Family Medicine

## 2019-09-21 ENCOUNTER — Encounter: Payer: Self-pay | Admitting: Family Medicine

## 2019-09-21 ENCOUNTER — Other Ambulatory Visit: Payer: Self-pay

## 2019-09-21 VITALS — BP 123/77 | HR 80 | Temp 96.6°F | Ht 65.0 in | Wt 232.0 lb

## 2019-09-21 DIAGNOSIS — N950 Postmenopausal bleeding: Secondary | ICD-10-CM

## 2019-09-21 DIAGNOSIS — Z124 Encounter for screening for malignant neoplasm of cervix: Secondary | ICD-10-CM | POA: Diagnosis not present

## 2019-09-21 DIAGNOSIS — I83813 Varicose veins of bilateral lower extremities with pain: Secondary | ICD-10-CM | POA: Diagnosis not present

## 2019-09-21 DIAGNOSIS — Z0000 Encounter for general adult medical examination without abnormal findings: Secondary | ICD-10-CM | POA: Insufficient documentation

## 2019-09-21 HISTORY — DX: Postmenopausal bleeding: N95.0

## 2019-09-21 NOTE — Telephone Encounter (Signed)
[facility] referring for Post-menopausal bleeding. Called and left voicemail for patient to call back to be scheduled.

## 2019-09-21 NOTE — Assessment & Plan Note (Addendum)
Pt reports vaginal bleeding x3days Worse after heavy lifting.  She does have h/o thrombocytopenia, so recheck CBC Check TSH Will order [place] and refer to GYN to evaluate and treat for possible endometrial biopsy

## 2019-09-21 NOTE — Assessment & Plan Note (Signed)
Pt continues to have leg pain from varicose veins.  Referral placed to vascular in [facility] per pts request.

## 2019-09-21 NOTE — Progress Notes (Signed)
I,Laura E Walsh,acting as a scribe for Lavon Paganini, MD.,have documented all relevant documentation on the behalf of Lavon Paganini, MD,as directed by  Lavon Paganini, MD while in the presence of Lavon Paganini, MD.   Complete physical exam   Patient: Cindy Trujillo   DOB: 18-Sep-1955   64 y.o. Female  MRN: [id number] Visit Date: 09/21/2019  Today's healthcare provider: Lavon Paganini, MD   Chief Complaint  Patient presents with  . Annual Exam  . Vaginal Bleeding    Started about two weeks ago.    Subjective    LAIKLYN PILKENTON is a 64 y.o. female who presents today for a complete physical exam.  She reports consuming a general diet. The patient does not participate in regular exercise at present. She generally feels well. She reports sleeping well. She does have additional problems to discuss today.  HPI  01/15/2017 Pap/HPV-negative 08/23/2017 Colonoscopy-diverticulosis, internal hemorrhoids, repeat in 10 years   Reports post-menopausal bleeding x3 days 4 days ago.  Felt like a regular period, but lighter.  Started after lifting something heavy.  No new sexual partners.  No discharge, fever, abd pain.  Past Medical History:  Diagnosis Date  . Diabetes mellitus without complication ([place])   . GERD (gastroesophageal reflux disease)   . Hyperlipidemia    Past Surgical History:  Procedure Laterality Date  . COLONOSCOPY WITH PROPOFOL N/A 08/23/2017   Procedure: COLONOSCOPY WITH PROPOFOL;  Surgeon: Manya Silvas, MD;  Location: [facility] ENDOSCOPY;  Service: Endoscopy;  Laterality: N/A;  . TONSILLECTOMY AND ADENOIDECTOMY  1968   Social History   Socioeconomic History  . Marital status: Married    Spouse name: Francee Piccolo  . Number of children: 0  . Years of education: some college  . Highest education level: Not on file  Occupational History  . Occupation: CARE MANGEMENT    Employer: [employer]  Tobacco Use  . Smoking status: Never Smoker    . Smokeless tobacco: Never Used  Vaping Use  . Vaping Use: Never used  Substance and Sexual Activity  . Alcohol use: No    Alcohol/week: 0.0 standard drinks  . Drug use: No  . Sexual activity: Not Currently  Other Topics Concern  . Not on file  Social History Narrative  . Not on file   Social Determinants of Health   Financial Resource Strain:   . Difficulty of Paying Living Expenses:   Food Insecurity:   . Worried About Charity fundraiser in the Last Year:   . Arboriculturist in the Last Year:   Transportation Needs:   . Film/video editor (Medical):   Marland Kitchen Lack of Transportation (Non-Medical):   Physical Activity:   . Days of Exercise per Week:   . Minutes of Exercise per Session:   Stress:   . Feeling of Stress :   Social Connections:   . Frequency of Communication with Friends and Family:   . Frequency of Social Gatherings with Friends and Family:   . Attends Religious Services:   . Active Member of Clubs or Organizations:   . Attends Archivist Meetings:   Marland Kitchen Marital Status:   Intimate Partner Violence:   . Fear of Current or Ex-Partner:   . Emotionally Abused:   Marland Kitchen Physically Abused:   . Sexually Abused:    Family Status  Relation Name Status  . Mother  Deceased       complications from lung disease  . Father  Alive  . Sister  Alive  . Brother  Alive  . Sister  Alive  . Sister  Alive  . Brother  Alive  . MGM  (Not Specified)  . MGF  (Not Specified)  . PGM  (Not Specified)  . PGF  (Not Specified)  . Neg Hx  (Not Specified)   Family History  Problem Relation Age of Onset  . COPD Mother        heavy smoker  . Pulmonary fibrosis Mother   . Fibromyalgia Mother   . COPD Father   . Melanoma Father   . COPD Sister   . COPD Brother   . COPD Sister   . COPD Sister   . Healthy Brother   . Heart failure Maternal Grandmother   . Heart attack Maternal Grandfather   . Cancer Paternal Grandmother        unsure of type, had mets in colon  .  Heart attack Paternal Grandfather   . Cancer Paternal Grandfather        unknown  . Breast cancer Neg Hx   . Cervical cancer Neg Hx    Allergies  Allergen Reactions  . Sulfa Antibiotics Hives    Patient Care Team: Virginia Crews, MD as PCP - General (Family Medicine)   Medications: Outpatient Medications Prior to Visit  Medication Sig  . apixaban (ELIQUIS) 5 MG TABS tablet Take 1 tablet (5 mg total) by mouth 2 (two) times daily.  . dapagliflozin propanediol (FARXIGA) 10 MG TABS tablet Take 10 mg by mouth daily.  . ferrous sulfate 325 (65 FE) MG tablet Take by mouth.  . FreeStyle Unistick II Lancets MISC Use as instructed to check blood glucose daily  . glucose blood (FREESTYLE TEST STRIPS) test strip Use as instructed to check blood glucose daily  . glucose monitoring kit (FREESTYLE) monitoring kit 1 each by Does not apply route as needed for other.  . lisinopril (ZESTRIL) 5 MG tablet Take 1 tablet (5 mg total) by mouth daily.  Marland Kitchen loratadine (CLARITIN) 10 MG tablet Take 1 tablet (10 mg total) by mouth daily.  . metFORMIN (GLUCOPHAGE) 1000 MG tablet Take 1 tablet (1,000 mg total) by mouth 2 (two) times daily with a meal.  . omeprazole (PRILOSEC) 20 MG capsule TAKE 1 CAPSULE (20 MG TOTAL) BY MOUTH DAILY.  . rosuvastatin (CRESTOR) 5 MG tablet TAKE 1 TABLET BY MOUTH DAILY  . vitamin B-12 (CYANOCOBALAMIN) 1000 MCG tablet Take 1 tablet (1,000 mcg total) by mouth daily.   No facility-administered medications prior to visit.    Review of Systems  Constitutional: Negative.   HENT: Negative.   Eyes: Negative.   Respiratory: Negative.   Cardiovascular: Negative.   Gastrointestinal: Negative.   Endocrine: Negative.   Genitourinary: Positive for vaginal bleeding and vaginal discharge.  Musculoskeletal: Negative.   Skin: Negative.   Allergic/Immunologic: Negative.   Neurological: Negative.   Hematological: Negative.   Psychiatric/Behavioral: Negative.     Last CBC Lab Results   Component Value Date   WBC 5.5 03/14/2019   HGB 11.6 (L) 03/14/2019   HCT 37.0 03/14/2019   MCV 83.9 03/14/2019   MCH 26.3 03/14/2019   RDW 14.1 03/14/2019   PLT 112 (L) 27/09/2374   Last metabolic panel Lab Results  Component Value Date   GLUCOSE 136 (H) 06/26/2019   NA 141 06/26/2019   K 4.2 06/26/2019   CL 108 (H) 06/26/2019   CO2 19 (L) 06/26/2019   BUN 15 06/26/2019  CREATININE 1.13 (H) 06/26/2019   GFRNONAA 53 (L) 06/26/2019   GFRAA 61 06/26/2019   CALCIUM 9.3 06/26/2019   PROT 6.7 06/26/2019   ALBUMIN 4.0 06/26/2019   LABGLOB 2.7 06/26/2019   AGRATIO 1.5 06/26/2019   BILITOT 0.8 06/26/2019   ALKPHOS 92 06/26/2019   AST 32 06/26/2019   ALT 20 06/26/2019   ANIONGAP 10 03/14/2019   Last lipids Lab Results  Component Value Date   CHOL 137 06/26/2019   HDL 51 06/26/2019   LDLCALC 51 06/26/2019   TRIG 220 (H) 06/26/2019   CHOLHDL 2.7 06/26/2019   Last hemoglobin A1c Lab Results  Component Value Date   HGBA1C 6.9 (A) 06/26/2019   Last thyroid functions Lab Results  Component Value Date   TSH 1.84 01/15/2017   Last vitamin D No results found for: 25OHVITD2, 25OHVITD3, VD25OH Last vitamin B12 and Folate Lab Results  Component Value Date   VITAMINB12 195 03/14/2019   FOLATE 10.5 03/14/2019    Objective    BP 123/77 (BP Location: Left Arm, Patient Position: Sitting, Cuff Size: Large)   Pulse 80   Temp (!) 96.6 F (35.9 C) (Temporal)   Ht 5' 5"  (1.651 m)   Wt 232 lb (105.2 kg)   BMI 38.61 kg/m    Physical Exam Exam conducted with a chaperone present.  Constitutional:      General: She is not in acute distress.    Appearance: Normal appearance. She is well-developed. She is not diaphoretic.  HENT:     Head: Normocephalic and atraumatic.     Right Ear: Tympanic membrane, ear canal and external ear normal.     Left Ear: Tympanic membrane, ear canal and external ear normal.     Mouth/Throat:     Pharynx: Uvula midline.  Eyes:     General:  Lids are normal.     Conjunctiva/sclera: Conjunctivae normal.     Pupils: Pupils are equal, round, and reactive to light.  Neck:     Thyroid: No thyroid mass or thyromegaly.     Vascular: No carotid bruit.     Trachea: Trachea normal.  Cardiovascular:     Rate and Rhythm: Normal rate and regular rhythm.     Pulses: Normal pulses.     Heart sounds: Normal heart sounds. No murmur heard.   Pulmonary:     Effort: Pulmonary effort is normal. No respiratory distress.     Breath sounds: Normal breath sounds. No wheezing or rhonchi.  Abdominal:     Palpations: Abdomen is soft.     Tenderness: There is no abdominal tenderness. There is no guarding.  Genitourinary:    Vagina: Normal.     Cervix: Cervical bleeding present.     Comments: GYN:  External genitalia within normal limits.  Vaginal mucosa pink, moist, normal rugae.  Nonfriable cervix without lesions, no discharge, minimal spotting noted on speculum exam.  Bimanual exam revealed normal, nongravid uterus.  No cervical motion tenderness. No adnexal masses bilaterally.    Musculoskeletal:        General: Normal range of motion.     Cervical back: Normal range of motion and neck supple.     Right lower leg: No edema.     Left lower leg: No edema.  Lymphadenopathy:     Cervical: No cervical adenopathy.  Skin:    General: Skin is warm and dry.     Findings: No rash.  Neurological:     Mental Status: She is alert and oriented  to person, place, and time. Mental status is at baseline.     Cranial Nerves: No cranial nerve deficit.  Psychiatric:        Mood and Affect: Mood normal.        Speech: Speech normal.        Behavior: Behavior normal.        Thought Content: Thought content normal.        Judgment: Judgment normal.      Depression Screen  PHQ 2/9 Scores 09/21/2019 10/10/2018 02/25/2017  PHQ - 2 Score 0 0 0  PHQ- 9 Score 0 1 -    No results found for any visits on 09/21/19.  Assessment & Plan    Routine Health  Maintenance and Physical Exam  Exercise Activities and Dietary recommendations Goals   None     Immunization History  Administered Date(s) Administered  . Influenza,inj,Quad PF,6+ Mos 01/05/2017  . PFIZER SARS-COV-2 Vaccination 06/09/2019, 06/30/2019  . Pneumococcal Polysaccharide-23 03/08/2017  . Tdap 03/08/2017  . Zoster Recombinat (Shingrix) 03/20/2019, 05/22/2019    Health Maintenance  Topic Date Due  . MAMMOGRAM  Never done  . INFLUENZA VACCINE  11/12/2019  . HEMOGLOBIN A1C  12/27/2019  . PAP SMEAR-Modifier  01/16/2020  . OPHTHALMOLOGY EXAM  04/09/2020  . FOOT EXAM  06/25/2020  . TETANUS/TDAP  03/09/2027  . COLONOSCOPY  08/24/2027  . PNEUMOCOCCAL POLYSACCHARIDE VACCINE AGE 1-64 HIGH RISK  Completed  . COVID-19 Vaccine  Completed  . Hepatitis C Screening  Completed  . HIV Screening  Completed    Discussed health benefits of physical activity, and encouraged her to engage in regular exercise appropriate for her age and condition.  Problem List Items Addressed This Visit      Cardiovascular and Mediastinum   Varicose veins of leg with pain, bilateral    Pt continues to have leg pain from varicose veins.  Referral placed to vascular in [facility] per pts request.       Relevant Orders   Ambulatory referral to Vascular Surgery     Other   Morbid obesity [facility])    Discussed importance of healthy weight management Discussed diet and exercise       Post-menopausal bleeding    Pt reports vaginal bleeding x3days Worse after heavy lifting.  She does have h/o thrombocytopenia, so recheck CBC Check TSH Will order [place] and refer to GYN to evaluate and treat for possible endometrial biopsy      Relevant Orders   Ambulatory referral to Obstetrics / Gynecology   CBC with Differential/Platelet   TSH   US Pelvic Complete With Transvaginal   Cervical cancer screening   Relevant Orders   Cytology - PAP   Annual physical exam - Primary       Return in about 3  months (around 12/22/2019) for For Diabetes .     I, Lavon Paganini, MD, have reviewed all documentation for this visit. The documentation on 09/21/19 for the exam, diagnosis, procedures, and orders are all accurate and complete.   Zareen Jamison, Dionne Bucy, MD, MPH [facility]

## 2019-09-21 NOTE — Patient Instructions (Signed)

## 2019-09-21 NOTE — Assessment & Plan Note (Signed)
Discussed importance of healthy weight management Discussed diet and exercise  

## 2019-09-22 LAB — CBC WITH DIFFERENTIAL/PLATELET
Basophils Absolute: 0 10*3/uL (ref 0.0–0.2)
Basos: 0 %
EOS (ABSOLUTE): 0.1 10*3/uL (ref 0.0–0.4)
Eos: 3 %
Hematocrit: 34.4 % (ref 34.0–46.6)
Hemoglobin: 11.5 g/dL (ref 11.1–15.9)
Immature Grans (Abs): 0 10*3/uL (ref 0.0–0.1)
Immature Granulocytes: 0 %
Lymphocytes Absolute: 1.6 10*3/uL (ref 0.7–3.1)
Lymphs: 34 %
MCH: 26.9 pg (ref 26.6–33.0)
MCHC: 33.4 g/dL (ref 31.5–35.7)
MCV: 80 fL (ref 79–97)
Monocytes Absolute: 0.3 10*3/uL (ref 0.1–0.9)
Monocytes: 7 %
Neutrophils Absolute: 2.7 10*3/uL (ref 1.4–7.0)
Neutrophils: 56 %
Platelets: 97 10*3/uL — CL (ref 150–450)
RBC: 4.28 x10E6/uL (ref 3.77–5.28)
RDW: 14.7 % (ref 11.7–15.4)
WBC: 4.8 10*3/uL (ref 3.4–10.8)

## 2019-09-22 LAB — TSH: TSH: 2.75 u[IU]/mL (ref 0.450–4.500)

## 2019-09-25 ENCOUNTER — Telehealth: Payer: Self-pay

## 2019-09-25 LAB — CYTOLOGY - PAP
Chlamydia: NEGATIVE
Comment: NEGATIVE
Comment: NEGATIVE
Comment: NORMAL
Diagnosis: NEGATIVE
High risk HPV: NEGATIVE
Neisseria Gonorrhea: NEGATIVE

## 2019-09-25 NOTE — Telephone Encounter (Signed)
Message was left with pts husband Francee Piccolo he stated that he would relay the Message in reference to getting her appts scheduled as requested and for her to give the office  a call back.

## 2019-09-25 NOTE — Telephone Encounter (Signed)
Pt called back and stated that she would rather be seen by her GYN provider first before having any appts scheduled. She said that she would call back at a later date to  have lab/MD appts scheduled.  Pt also wanted to know if you were able to view her labs from 09/21/19.

## 2019-09-25 NOTE — Telephone Encounter (Signed)
Result note received with MD recommendation:  Schedule patient to follow-up with me in the next 1 to 2 weeks.  Lab MD-please order CBC, check smear, CMP, B12, folate.   Please schedule the lab 2 days prior to MD appt.

## 2019-09-25 NOTE — Telephone Encounter (Signed)
Please see patient's response

## 2019-09-26 ENCOUNTER — Other Ambulatory Visit: Payer: Self-pay

## 2019-09-26 ENCOUNTER — Ambulatory Visit
Admission: RE | Admit: 2019-09-26 | Discharge: 2019-09-26 | Disposition: A | Discharge status: Home / Self Care | Payer: 59 | Source: Ambulatory Visit | Attending: Family Medicine | Admitting: Family Medicine

## 2019-09-26 DIAGNOSIS — N950 Postmenopausal bleeding: Secondary | ICD-10-CM | POA: Insufficient documentation

## 2019-09-27 ENCOUNTER — Telehealth: Payer: Self-pay

## 2019-09-27 NOTE — Telephone Encounter (Signed)
lmtcb

## 2019-09-27 NOTE — Telephone Encounter (Signed)
-----   Message from Mar Daring, [place] sent at 09/27/2019 10:23 AM EDT ----- Pelvic US does show to have one area in the uterine wall that is thicker than other areas. There are multiple benign processes this could be, but the most worrisome cause would be a carcinoma. It is recommended to f/u with your GYN for endometrial biopsy of the area. I know you have an appt with Dr. Gilman Schmidt but that is not until 10/24/19. I would recommend to call and see if there is anything sooner just for peace of mind.

## 2019-09-27 NOTE — Telephone Encounter (Signed)
Patient advised as below. Patient verbalizes understanding and is in agreement with treatment plan.  

## 2019-10-02 ENCOUNTER — Telehealth: Payer: Self-pay

## 2019-10-02 DIAGNOSIS — D696 Thrombocytopenia, unspecified: Secondary | ICD-10-CM

## 2019-10-02 NOTE — Telephone Encounter (Signed)
Patient advised as below. Patient verbalizes understanding and is in agreement with treatment plan.  

## 2019-10-02 NOTE — Telephone Encounter (Signed)
-----  Message from Virginia Crews, MD sent at 10/02/2019 12:10 PM EDT ----- Patient does need to see GI regarding her liver and thrombocytopenia per Dr Collie Siad recommendation.  Please call patient and place referral. Thanks.  Dr Tasia Catchings is also reaching out to schedule f/u appt. ----- Message ----- From: Paulene Floor Sent: 09/22/2019   1:36 PM EDT To: Virginia Crews, MD, Earlie Server, MD  [facility] Juliann Pulse,   Dr. B is out of the office and I am looking over some results. I see you have a history of low platelets and some recent vaginal bleeding. Your platelets are slightly lower than usual but do not need intervention at this time.  It looks like you were seen by Dr. Tasia Catchings in December in 03/2019 who state in her last note if your numbers continue to decrease, she may consider a bone marrow biopsy.    There is no anemia to signify significant blood loss. It looks like you are scheduled for an ultrasound and follow up with OBGYN. I'll forward this to Dr. Jacinto Reap and also Dr. Tasia Catchings.   Best, Carles Collet, PA-C

## 2019-10-03 NOTE — Telephone Encounter (Signed)
Thrombocytopenia is fine

## 2019-10-12 ENCOUNTER — Encounter: Payer: 59 | Admitting: Family Medicine

## 2019-10-12 ENCOUNTER — Encounter: Payer: Self-pay | Admitting: *Deleted

## 2019-10-23 ENCOUNTER — Other Ambulatory Visit: Payer: Self-pay

## 2019-10-23 ENCOUNTER — Encounter: Payer: Self-pay | Admitting: Family Medicine

## 2019-10-23 MED ORDER — ROSUVASTATIN CALCIUM 5 MG PO TABS: 5.0000 mg | ORAL_TABLET | Freq: Every day | ORAL | 3 refills | Status: DC

## 2019-10-24 ENCOUNTER — Ambulatory Visit (INDEPENDENT_AMBULATORY_CARE_PROVIDER_SITE_OTHER): Payer: 59 | Admitting: Obstetrics and Gynecology

## 2019-10-24 ENCOUNTER — Encounter: Payer: Self-pay | Admitting: Obstetrics and Gynecology

## 2019-10-24 ENCOUNTER — Other Ambulatory Visit: Payer: Self-pay

## 2019-10-24 VITALS — BP 130/72 | HR 78 | Resp 18 | Ht 65.0 in | Wt 232.0 lb

## 2019-10-24 DIAGNOSIS — N950 Postmenopausal bleeding: Secondary | ICD-10-CM | POA: Diagnosis not present

## 2019-10-24 NOTE — Patient Instructions (Signed)
Hysteroscopy Hysteroscopy is a procedure that is used to examine the inside of a woman's womb (uterus). This may be done for various reasons, including:  To look for lumps (tumors) and other growths in the uterus.  To evaluate abnormal bleeding, fibroid tumors, polyps, scar tissue (adhesions), or cancer of the uterus.  To determine the cause of an inability to get pregnant (infertility) or repeated losses of pregnancies (miscarriages).  To find a lost IUD (intrauterine device).  To perform a procedure that permanently prevents pregnancy (sterilization). During this procedure, a thin, flexible tube with a small light and camera (hysteroscope) is used to examine the uterus. The camera sends images to a monitor in the room so that your health care provider can view the inside of your uterus. A hysteroscopy should be done right after a menstrual period to make sure that you are not pregnant. Tell a health care provider about:  Any allergies you have.  All medicines you are taking, including vitamins, herbs, eye drops, creams, and over-the-counter medicines.  Any problems you or family members have had with the use of anesthetic medicines.  Any blood disorders you have.  Any surgeries you have had.  Any medical conditions you have.  Whether you are pregnant or may be pregnant. What are the risks? Generally, this is a safe procedure. However, problems may occur, including:  Excessive bleeding.  Infection.  Damage to the uterus or other structures or organs.  Allergic reaction to medicines or fluids that are used in the procedure. What happens before the procedure? Staying hydrated Follow instructions from your health care provider about hydration, which may include:  Up to 2 hours before the procedure - you may continue to drink clear liquids, such as water, clear fruit juice, black coffee, and plain tea. Eating and drinking restrictions Follow instructions from your health care  provider about eating and drinking, which may include:  8 hours before the procedure - stop eating solid foods and drink clear liquids only  2 hours before the procedure - stop drinking clear liquids. General instructions  Ask your health care provider about: ? Changing or stopping your normal medicines. This is important if you take diabetes medicines or blood thinners. ? Taking medicines such as aspirin and ibuprofen. These medicines can thin your blood and cause bleeding. Do not take these medicines for 1 week before your procedure, or as told by your health care provider.  Do not use any products that contain nicotine or tobacco for 2 weeks before the procedure. This includes cigarettes and e-cigarettes. If you need help quitting, ask your health care provider.  Medicine may be placed in your cervix the day before the procedure. This medicine causes the cervix to have a larger opening (dilate). The larger opening makes it easier for the hysteroscope to be inserted into the uterus during the procedure.  Plan to have someone with you for the first 24-48 hours after the procedure, especially if you are given a medicine to make you fall asleep (general anesthetic).  Plan to have someone take you home from the hospital or clinic. What happens during the procedure?  To lower your risk of infection: ? Your health care team will wash or sanitize their hands. ? Your skin will be washed with soap. ? Hair may be removed from the surgical area.  An IV tube will be inserted into one of your veins.  You may be given one or more of the following: ? A medicine to help  you relax (sedative). ? A medicine that numbs the area around the cervix (local anesthetic). ? A medicine to make you fall asleep (general anesthetic).  A hysteroscope will be inserted through your vagina and into your uterus.  Air or fluid will be used to enlarge your uterus, enabling your health care provider to see your uterus  better. The amount of fluid used will be carefully checked throughout the procedure.  In some cases, tissue may be gently scraped from inside the uterus and sent to a lab for testing (biopsy). The procedure may vary among health care providers and hospitals. What happens after the procedure?  Your blood pressure, heart rate, breathing rate, and blood oxygen level will be monitored until the medicines you were given have worn off.  You may have some cramping. You may be given medicines for this.  You may have bleeding, which varies from light spotting to menstrual-like bleeding. This is normal.  If you had a biopsy done, it is your responsibility to get the results of your procedure. Ask your health care provider, or the department performing the procedure, when your results will be ready. Summary  Hysteroscopy is a procedure that is used to examine the inside of a woman's womb (uterus).  After the procedure, you may have bleeding, which varies from light spotting to menstrual-like bleeding. This is normal. You may also have cramping.  Plan to have someone take you home from the hospital or clinic. This information is not intended to replace advice given to you by your health care provider. Make sure you discuss any questions you have with your health care provider. Document Revised: 03/12/2017 Document Reviewed: 04/28/2016 Elsevier Patient Education  [place].

## 2019-10-24 NOTE — Progress Notes (Signed)
[name], female   DOB: 07/25/1955, 64 y.o.   MRN: [id number]  Reason for Consult: Gynecologic Exam   Referred by Virginia Crews, MD  Subjective:     HPI:  Cindy Trujillo is a 64 y.o. female. She presents today with complaints of postmenopausal bleeding which has been occurring on and off since June of 2021.  Pelvic US showed focal endometrial lesion/thickening of 1 cm.   DVT in May of 2020. Currently on Eliquis. DVT was found incidentally to her procedure for varicose veins.   Reports that she was noted to have low platelets  Last month and needs to follow up with hematology/oncology for a bone marrow biopsy.   Gynecological History Menarche: 73 Menopause: 70 Describes periods as regular, monthly Last pap smear: 09/21/2019- NIL Last Mammogram: None, patient only has clinical exams- declines mammogram  Hx OCP usage for for 15-18 years Hx obesity for 20 years  Obstetrical History G0P0  Past Medical History:  Diagnosis Date  . Diabetes mellitus without complication ([place])   . GERD (gastroesophageal reflux disease)   . Hyperlipidemia    Family History  Problem Relation Age of Onset  . COPD Mother        heavy smoker  . Pulmonary fibrosis Mother   . Fibromyalgia Mother   . COPD Father   . Melanoma Father   . COPD Sister   . COPD Brother   . COPD Sister   . COPD Sister   . Healthy Brother   . Heart failure Maternal Grandmother   . Heart attack Maternal Grandfather   . Cancer Paternal Grandmother        unsure of type, had mets in colon  . Heart attack Paternal Grandfather   . Cancer Paternal Grandfather        unknown  . Breast cancer Neg Hx   . Cervical cancer Neg Hx    Past Surgical History:  Procedure Laterality Date  . COLONOSCOPY WITH PROPOFOL N/A 08/23/2017   Procedure: COLONOSCOPY WITH PROPOFOL;  Surgeon: Manya Silvas, MD;  Location: [facility] ENDOSCOPY;  Service: Endoscopy;  Laterality: N/A;  . TONSILLECTOMY AND ADENOIDECTOMY   1968    Short Social History:  Social History   Tobacco Use  . Smoking status: Never Smoker  . Smokeless tobacco: Never Used  Substance Use Topics  . Alcohol use: No    Alcohol/week: 0.0 standard drinks    Allergies  Allergen Reactions  . Sulfa Antibiotics Hives    Current Outpatient Medications  Medication Sig Dispense Refill  . apixaban (ELIQUIS) 5 MG TABS tablet Take 1 tablet (5 mg total) by mouth 2 (two) times daily. 60 tablet 3  . dapagliflozin propanediol (FARXIGA) 10 MG TABS tablet Take 10 mg by mouth daily. 90 tablet 3  . ferrous sulfate 325 (65 FE) MG tablet Take by mouth.    . FreeStyle Unistick II Lancets MISC Use as instructed to check blood glucose daily 100 each 5  . glucose blood (FREESTYLE TEST STRIPS) test strip Use as instructed to check blood glucose daily 100 each 12  . glucose monitoring kit (FREESTYLE) monitoring kit 1 each by Does not apply route as needed for other. 1 each 0  . lisinopril (ZESTRIL) 5 MG tablet Take 1 tablet (5 mg total) by mouth daily. 90 tablet 3  . loratadine (CLARITIN) 10 MG tablet Take 1 tablet (10 mg total) by mouth daily. 90 tablet 3  . metFORMIN (GLUCOPHAGE) 1000 MG tablet Take  1 tablet (1,000 mg total) by mouth 2 (two) times daily with a meal. 180 tablet 3  . omeprazole (PRILOSEC) 20 MG capsule TAKE 1 CAPSULE (20 MG TOTAL) BY MOUTH DAILY. 90 capsule 3  . rosuvastatin (CRESTOR) 5 MG tablet Take 1 tablet (5 mg total) by mouth daily. 90 tablet 3  . vitamin B-12 (CYANOCOBALAMIN) 1000 MCG tablet Take 1 tablet (1,000 mcg total) by mouth daily. 90 tablet 1  . ALPRAZolam (XANAX) 0.5 MG tablet Take 0.5 mg by mouth 2 (two) times daily as needed.    . Blood Glucose Monitoring Suppl (FREESTYLE LITE) DEVI     . FREESTYLE LITE test strip     . glucose blood test strip     . Lancets (FREESTYLE) lancets      No current facility-administered medications for this visit.    Review of Systems  Constitutional: Negative for chills, fatigue, fever  and unexpected weight change.  HENT: Negative for trouble swallowing.  Eyes: Negative for loss of vision.  Respiratory: Negative for cough, shortness of breath and wheezing.  Cardiovascular: Negative for chest pain, leg swelling, palpitations and syncope.  GI: Negative for abdominal pain, blood in stool, diarrhea, nausea and vomiting.  GU: Negative for difficulty urinating, dysuria, frequency and hematuria.  Musculoskeletal: Negative for back pain, leg pain and joint pain.  Skin: Negative for rash.  Neurological: Negative for dizziness, headaches, light-headedness, numbness and seizures.  Psychiatric: Negative for behavioral problem, confusion, depressed mood and sleep disturbance.        Objective:  Objective   Vitals:   10/24/19 1041  BP: 130/72  Pulse: 78  Resp: 18  SpO2: 97%  Weight: 232 lb (105.2 kg)  Height: 5' 5"  (1.651 m)   Body mass index is 38.61 kg/m.  Physical Exam Vitals and nursing note reviewed.  Constitutional:      Appearance: She is well-developed.  HENT:     Head: Normocephalic and atraumatic.  Eyes:     Pupils: Pupils are equal, round, and reactive to light.  Cardiovascular:     Rate and Rhythm: Normal rate and regular rhythm.  Pulmonary:     Effort: Pulmonary effort is normal. No respiratory distress.  Genitourinary:    Comments: External: Normal appearing vulva. No lesions noted.  Speculum examination: Normal appearing cervix. Moderate dark red blood in the vaginal vault, 5 cc. No discharge.   Bimanual examination: Uterus midline, non-tender, normal in size, shape and contour.  No CMT. No adnexal masses. No adnexal tenderness. Pelvis not fixed.  Skin:    General: Skin is warm and dry.  Neurological:     Mental Status: She is alert and oriented to person, place, and time.  Psychiatric:        Behavior: Behavior normal.        Thought Content: Thought content normal.        Judgment: Judgment normal.         Assessment/Plan:    64 yo with  postmenopausal bleeding, possible endometrial polyp.  Low plt- needs BM biopsy with Dr. Tasia Catchings. Will need to check CBC prior to procedure.  On Elaquist- hx DVT more than 1 year ago. Will hold medication for 24 hours before procedure.  Plan hysteroscopy D&C for next week. Surgery scheduling request sent.   More than 30 minutes were spent face to face with the patient in the room, reviewing the medical record, labs and images, and coordinating care for the patient. The plan of management was discussed in detail  and counseling was provided.    Adrian Prows MD [facility], [facility] 10/24/2019 11:43 AM

## 2019-10-24 NOTE — H&P (View-Only) (Signed)
[name], female   DOB: 1955/09/04, 64 y.o.   MRN: [id number]  Reason for Consult: Gynecologic Exam   Referred by Virginia Crews, MD  Subjective:     HPI:  Cindy Trujillo is a 64 y.o. female. She presents today with complaints of postmenopausal bleeding which has been occurring on and off since June of 2021.  Pelvic US showed focal endometrial lesion/thickening of 1 cm.   DVT in May of 2020. Currently on Eliquis. DVT was found incidentally to her procedure for varicose veins.   Reports that she was noted to have low platelets  Last month and needs to follow up with hematology/oncology for a bone marrow biopsy.   Gynecological History Menarche: 55 Menopause: 11 Describes periods as regular, monthly Last pap smear: 09/21/2019- NIL Last Mammogram: None, patient only has clinical exams- declines mammogram  Hx OCP usage for for 15-18 years Hx obesity for 20 years  Obstetrical History G0P0  Past Medical History:  Diagnosis Date  . Diabetes mellitus without complication ([place])   . GERD (gastroesophageal reflux disease)   . Hyperlipidemia    Family History  Problem Relation Age of Onset  . COPD Mother        heavy smoker  . Pulmonary fibrosis Mother   . Fibromyalgia Mother   . COPD Father   . Melanoma Father   . COPD Sister   . COPD Brother   . COPD Sister   . COPD Sister   . Healthy Brother   . Heart failure Maternal Grandmother   . Heart attack Maternal Grandfather   . Cancer Paternal Grandmother        unsure of type, had mets in colon  . Heart attack Paternal Grandfather   . Cancer Paternal Grandfather        unknown  . Breast cancer Neg Hx   . Cervical cancer Neg Hx    Past Surgical History:  Procedure Laterality Date  . COLONOSCOPY WITH PROPOFOL N/A 08/23/2017   Procedure: COLONOSCOPY WITH PROPOFOL;  Surgeon: Manya Silvas, MD;  Location: [facility] ENDOSCOPY;  Service: Endoscopy;  Laterality: N/A;  . TONSILLECTOMY AND ADENOIDECTOMY   1968    Short Social History:  Social History   Tobacco Use  . Smoking status: Never Smoker  . Smokeless tobacco: Never Used  Substance Use Topics  . Alcohol use: No    Alcohol/week: 0.0 standard drinks    Allergies  Allergen Reactions  . Sulfa Antibiotics Hives    Current Outpatient Medications  Medication Sig Dispense Refill  . apixaban (ELIQUIS) 5 MG TABS tablet Take 1 tablet (5 mg total) by mouth 2 (two) times daily. 60 tablet 3  . dapagliflozin propanediol (FARXIGA) 10 MG TABS tablet Take 10 mg by mouth daily. 90 tablet 3  . ferrous sulfate 325 (65 FE) MG tablet Take by mouth.    . FreeStyle Unistick II Lancets MISC Use as instructed to check blood glucose daily 100 each 5  . glucose blood (FREESTYLE TEST STRIPS) test strip Use as instructed to check blood glucose daily 100 each 12  . glucose monitoring kit (FREESTYLE) monitoring kit 1 each by Does not apply route as needed for other. 1 each 0  . lisinopril (ZESTRIL) 5 MG tablet Take 1 tablet (5 mg total) by mouth daily. 90 tablet 3  . loratadine (CLARITIN) 10 MG tablet Take 1 tablet (10 mg total) by mouth daily. 90 tablet 3  . metFORMIN (GLUCOPHAGE) 1000 MG tablet Take  1 tablet (1,000 mg total) by mouth 2 (two) times daily with a meal. 180 tablet 3  . omeprazole (PRILOSEC) 20 MG capsule TAKE 1 CAPSULE (20 MG TOTAL) BY MOUTH DAILY. 90 capsule 3  . rosuvastatin (CRESTOR) 5 MG tablet Take 1 tablet (5 mg total) by mouth daily. 90 tablet 3  . vitamin B-12 (CYANOCOBALAMIN) 1000 MCG tablet Take 1 tablet (1,000 mcg total) by mouth daily. 90 tablet 1  . ALPRAZolam (XANAX) 0.5 MG tablet Take 0.5 mg by mouth 2 (two) times daily as needed.    . Blood Glucose Monitoring Suppl (FREESTYLE LITE) DEVI     . FREESTYLE LITE test strip     . glucose blood test strip     . Lancets (FREESTYLE) lancets      No current facility-administered medications for this visit.    Review of Systems  Constitutional: Negative for chills, fatigue, fever  and unexpected weight change.  HENT: Negative for trouble swallowing.  Eyes: Negative for loss of vision.  Respiratory: Negative for cough, shortness of breath and wheezing.  Cardiovascular: Negative for chest pain, leg swelling, palpitations and syncope.  GI: Negative for abdominal pain, blood in stool, diarrhea, nausea and vomiting.  GU: Negative for difficulty urinating, dysuria, frequency and hematuria.  Musculoskeletal: Negative for back pain, leg pain and joint pain.  Skin: Negative for rash.  Neurological: Negative for dizziness, headaches, light-headedness, numbness and seizures.  Psychiatric: Negative for behavioral problem, confusion, depressed mood and sleep disturbance.        Objective:  Objective   Vitals:   10/24/19 1041  BP: 130/72  Pulse: 78  Resp: 18  SpO2: 97%  Weight: 232 lb (105.2 kg)  Height: 5' 5"  (1.651 m)   Body mass index is 38.61 kg/m.  Physical Exam Vitals and nursing note reviewed.  Constitutional:      Appearance: She is well-developed.  HENT:     Head: Normocephalic and atraumatic.  Eyes:     Pupils: Pupils are equal, round, and reactive to light.  Cardiovascular:     Rate and Rhythm: Normal rate and regular rhythm.  Pulmonary:     Effort: Pulmonary effort is normal. No respiratory distress.  Genitourinary:    Comments: External: Normal appearing vulva. No lesions noted.  Speculum examination: Normal appearing cervix. Moderate dark red blood in the vaginal vault, 5 cc. No discharge.   Bimanual examination: Uterus midline, non-tender, normal in size, shape and contour.  No CMT. No adnexal masses. No adnexal tenderness. Pelvis not fixed.  Skin:    General: Skin is warm and dry.  Neurological:     Mental Status: She is alert and oriented to person, place, and time.  Psychiatric:        Behavior: Behavior normal.        Thought Content: Thought content normal.        Judgment: Judgment normal.         Assessment/Plan:    64 yo with  postmenopausal bleeding, possible endometrial polyp.  Low plt- needs BM biopsy with Dr. Tasia Catchings. Will need to check CBC prior to procedure.  On Elaquist- hx DVT more than 1 year ago. Will hold medication for 24 hours before procedure.  Plan hysteroscopy D&C for next week. Surgery scheduling request sent.   More than 30 minutes were spent face to face with the patient in the room, reviewing the medical record, labs and images, and coordinating care for the patient. The plan of management was discussed in detail  and counseling was provided.    Adrian Prows MD [facility] 10/24/2019 11:43 AM

## 2019-10-26 ENCOUNTER — Encounter
Admission: RE | Admit: 2019-10-26 | Discharge: 2019-10-26 | Disposition: A | Discharge status: Home / Self Care | Payer: 59 | Source: Ambulatory Visit | Attending: Obstetrics and Gynecology | Admitting: Obstetrics and Gynecology

## 2019-10-26 ENCOUNTER — Other Ambulatory Visit: Payer: Self-pay

## 2019-10-26 ENCOUNTER — Telehealth: Payer: Self-pay

## 2019-10-26 DIAGNOSIS — Z01812 Encounter for preprocedural laboratory examination: Secondary | ICD-10-CM | POA: Insufficient documentation

## 2019-10-26 NOTE — Patient Instructions (Signed)
Your procedure is scheduled on: 10/31/19 Report to [place]. To find out your arrival time please call [phone] between 1PM - 3PM on 10/30/19.  Remember: Instructions that are not followed completely may result in serious medical risk, up to and including death, or upon the discretion of your surgeon and anesthesiologist your surgery may need to be rescheduled.     _X__ 1. Do not eat food after midnight the night before your procedure.                 No gum chewing or hard candies. You may drink clear liquids up to 2 hours                 before you are scheduled to arrive for your surgery- DO not drink clear                 liquids within 2 hours of the start of your surgery.                 Clear Liquids include:  water, apple juice without pulp, clear carbohydrate                 drink such as Clearfast or Gatorade, Black Coffee or Tea (Do not add                 anything to coffee or tea). Diabetics water only  __X__2.  On the morning of surgery brush your teeth with toothpaste and water, you                 may rinse your mouth with mouthwash if you wish.  Do not swallow any              toothpaste of mouthwash.     _X__ 3.  No Alcohol for 24 hours before or after surgery.   _X__ 4.  Do Not Smoke or use e-cigarettes For 24 Hours Prior to Your Surgery.                 Do not use any chewable tobacco products for at least 6 hours prior to                 surgery.  ____  5.  Bring all medications with you on the day of surgery if instructed.   __X__  6.  Notify your doctor if there is any change in your medical condition      (cold, fever, infections).     Do not wear jewelry, make-up, hairpins, clips or nail polish. Do not wear lotions, powders, or perfumes.  Do not shave 48 hours prior to surgery. Men may shave face and neck. Do not bring valuables to the hospital.    [facility] is not responsible for any belongings or  valuables.  Contacts, dentures/partials or body piercings may not be worn into surgery. Bring a case for your contacts, glasses or hearing aids, a denture cup will be supplied. Leave your suitcase in the car. After surgery it may be brought to your room. For patients admitted to the hospital, discharge time is determined by your treatment team.   Patients discharged the day of surgery will not be allowed to drive home.   Please read over the following fact sheets that you were given:   MRSA Information  __X__ Take these medicines the morning of surgery with A SIP OF WATER:  1. loratadine (CLARITIN) 10 MG tablet  2. omeprazole (PRILOSEC) 20 MG capsule  3.   4.  5.  6.  ____ Fleet Enema (as directed)   __X__ Use CHG Soap/SAGE wipes as directed  ____ Use inhalers on the day of surgery  __X__ Stop metformin/Janumet/Farxiga 2 days prior to surgery    ____ Take 1/2 of usual insulin dose the night before surgery. No insulin the morning          of surgery.   __X__ Stop Blood Thinners Coumadin/Plavix/Xarelto/Pleta/Pradaxa/Eliquis/Effient/Aspirin  on   Or contact your Surgeon, Cardiologist or Medical Doctor regarding  ability to stop your blood thinners    STOP AS PREVIOUSLY RECOMMENDED  __X__ Stop Anti-inflammatories 7 days before surgery such as Advil, Ibuprofen, Motrin,  BC or Goodies Powder, Naprosyn, Naproxen, Aleve    __X__ Stop all herbal supplements, fish oil or vitamin E until after surgery.    ____ Bring C-Pap to the hospital.

## 2019-10-26 NOTE — Telephone Encounter (Signed)
Pt calling; saw preadmit nurse today; does CRS think she will need something for nausea after surgery?  Can rx for nausea med and for pain med be sent to [facility] so pt can go ahead and p/u ahead of time in case pharm closed when she leaves hospital.  [phone]

## 2019-10-27 ENCOUNTER — Other Ambulatory Visit
Admission: RE | Admit: 2019-10-27 | Discharge: 2019-10-27 | Disposition: A | Discharge status: Home / Self Care | Payer: 59 | Source: Ambulatory Visit | Attending: Obstetrics and Gynecology | Admitting: Obstetrics and Gynecology

## 2019-10-27 DIAGNOSIS — Z20822 Contact with and (suspected) exposure to covid-19: Secondary | ICD-10-CM | POA: Diagnosis not present

## 2019-10-27 LAB — SARS CORONAVIRUS 2 (TAT 6-24 HRS): SARS Coronavirus 2: NEGATIVE

## 2019-10-31 ENCOUNTER — Ambulatory Visit: Payer: 59 | Admitting: Registered Nurse

## 2019-10-31 ENCOUNTER — Encounter: Payer: Self-pay | Admitting: Obstetrics and Gynecology

## 2019-10-31 ENCOUNTER — Encounter
Admission: RE | Disposition: A | Discharge status: Home / Self Care | Payer: Self-pay | Source: Home / Self Care | Attending: Obstetrics and Gynecology

## 2019-10-31 ENCOUNTER — Other Ambulatory Visit: Payer: Self-pay

## 2019-10-31 ENCOUNTER — Ambulatory Visit
Admission: RE | Admit: 2019-10-31 | Discharge: 2019-10-31 | Disposition: A | Discharge status: Home / Self Care | Payer: 59 | Attending: Obstetrics and Gynecology | Admitting: Obstetrics and Gynecology

## 2019-10-31 DIAGNOSIS — E785 Hyperlipidemia, unspecified: Secondary | ICD-10-CM | POA: Insufficient documentation

## 2019-10-31 DIAGNOSIS — Z7984 Long term (current) use of oral hypoglycemic drugs: Secondary | ICD-10-CM | POA: Diagnosis not present

## 2019-10-31 DIAGNOSIS — Z79899 Other long term (current) drug therapy: Secondary | ICD-10-CM | POA: Insufficient documentation

## 2019-10-31 DIAGNOSIS — Z7689 Persons encountering health services in other specified circumstances: Secondary | ICD-10-CM | POA: Diagnosis not present

## 2019-10-31 DIAGNOSIS — Z7901 Long term (current) use of anticoagulants: Secondary | ICD-10-CM | POA: Insufficient documentation

## 2019-10-31 DIAGNOSIS — E119 Type 2 diabetes mellitus without complications: Secondary | ICD-10-CM | POA: Diagnosis not present

## 2019-10-31 DIAGNOSIS — K219 Gastro-esophageal reflux disease without esophagitis: Secondary | ICD-10-CM | POA: Diagnosis not present

## 2019-10-31 DIAGNOSIS — N950 Postmenopausal bleeding: Secondary | ICD-10-CM | POA: Diagnosis not present

## 2019-10-31 DIAGNOSIS — Z86718 Personal history of other venous thrombosis and embolism: Secondary | ICD-10-CM | POA: Diagnosis not present

## 2019-10-31 DIAGNOSIS — C541 Malignant neoplasm of endometrium: Secondary | ICD-10-CM | POA: Insufficient documentation

## 2019-10-31 DIAGNOSIS — E1169 Type 2 diabetes mellitus with other specified complication: Secondary | ICD-10-CM | POA: Diagnosis not present

## 2019-10-31 HISTORY — PX: HYSTEROSCOPY WITH D & C: SHX1775

## 2019-10-31 LAB — CBC
HCT: 35.1 % — ABNORMAL LOW (ref 36.0–46.0)
Hemoglobin: 11.5 g/dL — ABNORMAL LOW (ref 12.0–15.0)
MCH: 26.6 pg (ref 26.0–34.0)
MCHC: 32.8 g/dL (ref 30.0–36.0)
MCV: 81.3 fL (ref 80.0–100.0)
Platelets: 115 10*3/uL — ABNORMAL LOW (ref 150–400)
RBC: 4.32 MIL/uL (ref 3.87–5.11)
RDW: 14.7 % (ref 11.5–15.5)
WBC: 4.1 10*3/uL (ref 4.0–10.5)
nRBC: 0 % (ref 0.0–0.2)

## 2019-10-31 LAB — GLUCOSE, CAPILLARY
Glucose-Capillary: 172 mg/dL — ABNORMAL HIGH (ref 70–99)
Glucose-Capillary: 129 mg/dL — ABNORMAL HIGH (ref 70–99)

## 2019-10-31 LAB — TYPE AND SCREEN
ABO/RH(D): O NEG
Antibody Screen: NEGATIVE

## 2019-10-31 LAB — ABO/RH: ABO/RH(D): O NEG

## 2019-10-31 SURGERY — DILATATION AND CURETTAGE /HYSTEROSCOPY
Anesthesia: General

## 2019-10-31 MED ORDER — DEXAMETHASONE SODIUM PHOSPHATE 10 MG/ML IJ SOLN
INTRAMUSCULAR | Status: DC | PRN
  Administered 2019-10-31: 10 mg via INTRAVENOUS

## 2019-10-31 MED ORDER — PROPOFOL 10 MG/ML IV BOLUS
INTRAVENOUS | Status: AC
  Filled 2019-10-31: qty 20

## 2019-10-31 MED ORDER — LIDOCAINE HCL (CARDIAC) PF 100 MG/5ML IV SOSY
PREFILLED_SYRINGE | INTRAVENOUS | Status: DC | PRN
  Administered 2019-10-31: 100 mg via INTRAVENOUS

## 2019-10-31 MED ORDER — FENTANYL CITRATE (PF) 100 MCG/2ML IJ SOLN
INTRAMUSCULAR | Status: AC
  Administered 2019-10-31: 25 ug via INTRAVENOUS
  Filled 2019-10-31: qty 2

## 2019-10-31 MED ORDER — CHLORHEXIDINE GLUCONATE 0.12 % MT SOLN
OROMUCOSAL | Status: AC
  Filled 2019-10-31: qty 15

## 2019-10-31 MED ORDER — FENTANYL CITRATE (PF) 100 MCG/2ML IJ SOLN
25.0000 ug | INTRAMUSCULAR | Status: DC | PRN
  Administered 2019-10-31 (×3): 25 ug via INTRAVENOUS

## 2019-10-31 MED ORDER — GLYCOPYRROLATE 0.2 MG/ML IJ SOLN
INTRAMUSCULAR | Status: DC | PRN
  Administered 2019-10-31: .2 mg via INTRAVENOUS

## 2019-10-31 MED ORDER — MIDAZOLAM HCL 2 MG/2ML IJ SOLN
INTRAMUSCULAR | Status: AC
  Filled 2019-10-31: qty 2

## 2019-10-31 MED ORDER — PROPOFOL 10 MG/ML IV BOLUS
INTRAVENOUS | Status: DC | PRN
  Administered 2019-10-31: 200 mg via INTRAVENOUS

## 2019-10-31 MED ORDER — SODIUM CHLORIDE 0.9 % IV SOLN: INTRAVENOUS | Status: DC

## 2019-10-31 MED ORDER — CHLORHEXIDINE GLUCONATE 0.12 % MT SOLN
15.0000 mL | Freq: Once | OROMUCOSAL | Status: AC
  Administered 2019-10-31: 15 mL via OROMUCOSAL

## 2019-10-31 MED ORDER — POVIDONE-IODINE 10 % EX SWAB: 2.0000 "application " | Freq: Once | CUTANEOUS | Status: DC

## 2019-10-31 MED ORDER — TRAMADOL HCL 50 MG PO TABS: 50.0000 mg | ORAL_TABLET | Freq: Four times a day (QID) | ORAL | 1 refills | Status: DC | PRN

## 2019-10-31 MED ORDER — ORAL CARE MOUTH RINSE: 15.0000 mL | Freq: Once | OROMUCOSAL | Status: AC

## 2019-10-31 MED ORDER — FENTANYL CITRATE (PF) 100 MCG/2ML IJ SOLN
INTRAMUSCULAR | Status: DC | PRN
  Administered 2019-10-31 (×2): 25 ug via INTRAVENOUS
  Administered 2019-10-31: 50 ug via INTRAVENOUS

## 2019-10-31 MED ORDER — PROMETHAZINE HCL 25 MG/ML IJ SOLN: 6.2500 mg | INTRAMUSCULAR | Status: DC | PRN

## 2019-10-31 MED ORDER — SILVER NITRATE-POT NITRATE 75-25 % EX MISC
CUTANEOUS | Status: DC | PRN
  Administered 2019-10-31: 4 via TOPICAL

## 2019-10-31 MED ORDER — MIDAZOLAM HCL 2 MG/2ML IJ SOLN
INTRAMUSCULAR | Status: DC | PRN
  Administered 2019-10-31: 2 mg via INTRAVENOUS

## 2019-10-31 MED ORDER — ACETAMINOPHEN 10 MG/ML IV SOLN
INTRAVENOUS | Status: DC | PRN
  Administered 2019-10-31: 1000 mg via INTRAVENOUS

## 2019-10-31 MED ORDER — ONDANSETRON HCL 4 MG/2ML IJ SOLN
INTRAMUSCULAR | Status: DC | PRN
  Administered 2019-10-31: 4 mg via INTRAVENOUS

## 2019-10-31 MED ORDER — FENTANYL CITRATE (PF) 100 MCG/2ML IJ SOLN
INTRAMUSCULAR | Status: AC
  Filled 2019-10-31: qty 2

## 2019-10-31 MED ORDER — ACETAMINOPHEN 10 MG/ML IV SOLN
INTRAVENOUS | Status: AC
  Filled 2019-10-31: qty 100

## 2019-10-31 SURGICAL SUPPLY — 20 items
CATH ROBINSON RED A/P 16FR (CATHETERS) ×3 IMPLANT
DEVICE MYOSURE LITE (MISCELLANEOUS) ×3 IMPLANT
DEVICE MYOSURE REACH (MISCELLANEOUS) IMPLANT
ELECT REM PT RETURN 9FT ADLT (ELECTROSURGICAL)
ELECTRODE REM PT RTRN 9FT ADLT (ELECTROSURGICAL) IMPLANT
GAUZE 4X4 16PLY RFD (DISPOSABLE) ×3 IMPLANT
GLOVE BIOGEL PI IND STRL 6.5 (GLOVE) ×2 IMPLANT
GLOVE BIOGEL PI INDICATOR 6.5 (GLOVE) ×4
GLOVE SURG SYN 6.5 ES PF (GLOVE) ×6 IMPLANT
GOWN STRL REUS W/ TWL LRG LVL3 (GOWN DISPOSABLE) ×2 IMPLANT
GOWN STRL REUS W/TWL LRG LVL3 (GOWN DISPOSABLE) ×6
KIT PROCEDURE FLUENT (KITS) ×3 IMPLANT
PACK DNC HYST (MISCELLANEOUS) ×3 IMPLANT
PAD OB MATERNITY 4.3X12.25 (PERSONAL CARE ITEMS) ×3 IMPLANT
PAD PREP 24X41 OB/GYN DISP (PERSONAL CARE ITEMS) ×3 IMPLANT
SEAL ROD LENS SCOPE MYOSURE (ABLATOR) ×3 IMPLANT
SOL .9 NS 3000ML IRR  AL (IV SOLUTION) ×2
SOL .9 NS 3000ML IRR AL (IV SOLUTION) ×1
SOL .9 NS 3000ML IRR UROMATIC (IV SOLUTION) ×1 IMPLANT
TOWEL OR 17X26 4PK STRL BLUE (TOWEL DISPOSABLE) ×3 IMPLANT

## 2019-10-31 NOTE — Anesthesia Postprocedure Evaluation (Signed)
Anesthesia Post Note  Patient: Cindy Trujillo  Procedure(s) Performed: DILATATION AND CURETTAGE /HYSTEROSCOPY WITH MYOSURE RESECTION (N/A )  Patient location during evaluation: PACU Anesthesia Type: General Level of consciousness: awake and alert Pain management: pain level controlled Vital Signs Assessment: post-procedure vital signs reviewed and stable Respiratory status: spontaneous breathing, nonlabored ventilation, respiratory function stable and patient connected to nasal cannula oxygen Cardiovascular status: blood pressure returned to baseline and stable Postop Assessment: no apparent nausea or vomiting Anesthetic complications: no   No complications documented.   Last Vitals:  Vitals:   10/31/19 1222 10/31/19 1243  BP: 120/63 (!) 118/57  Pulse: 65 64  Resp: 16 16  Temp: (!) 36.1 C   SpO2: 97% 100%    Last Pain:  Vitals:   10/31/19 1243  TempSrc:   PainSc: 0-No pain                 Martha Clan

## 2019-10-31 NOTE — Interval H&P Note (Signed)
History and Physical Interval Note:  10/31/2019 8:45 AM  Cindy Trujillo  has presented today for surgery, with the diagnosis of postmenopausal bleeding.  The various methods of treatment have been discussed with the patient and family. After consideration of risks, benefits and other options for treatment, the patient has consented to  Procedure(s): DILATATION AND CURETTAGE /HYSTEROSCOPY (N/A) as a surgical intervention.  The patient's history has been reviewed, patient examined, no change in status, stable for surgery.  I have reviewed the patient's chart and labs.  Questions were answered to the patient's satisfaction.     [place]

## 2019-10-31 NOTE — Op Note (Signed)
Operative Note  10/31/2019  PRE-OP DIAGNOSIS: Postmenopausal bleeding  POST-OP DIAGNOSIS: same   SURGEON: Brenya Taulbee MD  PROCEDURE: Procedure(s): DILATATION AND CURETTAGE /HYSTEROSCOPY WITH MYOSURE RESECTION   ANESTHESIA: Choice   ESTIMATED BLOOD LOSS: minimal, 1 cc   SPECIMENS:  Endometrial curettings  FLUID DEFICIT: Minimal  COMPLICATIONS: None  DISPOSITION: PACU - hemodynamically stable.  CONDITION: stable  FINDINGS: Exam under anesthesia revealed  9 cm uterus with bilateral adnexa without masses or fullness. Hysteroscopy revealed polypoid tissue growing from the right cornua. Normal bilateral tubal ostia and normal appearing endocervical canal.  PROCEDURE IN DETAIL: After informed consent was obtained, the patient was taken to the operating room where anesthesia was obtained without difficulty. The patient was positioned in the dorsal lithotomy position in [place]. The patient's bladder was catheterized with an in and out foley catheter. The patient was examined under anesthesia, with the above noted findings. The weightedspeculum was placed inside the patient's vagina, and the the anterior lip of the cervix was seen and grasped with the tenaculum.  The uterine cavity was sounded to 9 cm, and then the cervix was progressively dilated to a 16 French-Pratt dilator. The 0 degree hysteroscope was introduced, with saline fluid used to distend the intrauterine cavity, with the above noted findings.  The Myosure was used to remove the uterine polypoid tissue and smaple the cavity.  The polypoid tissue was removed flush to the surface of the endometrium.  Once the cavity was sampled entirely the hysteroscope was removed.   The uterine cavity was curetted until a gritty texture was noted, yielding endometrial curettings. Excellent hemostasis was noted, and all instruments were removed, with excellent hemostasis noted throughout. She was then taken out of dorsal lithotomy.   Minimal discrepancy in fluid was noted.  The patient tolerated the procedure well. Sponge, lap and needle counts were correct x2. The patient was taken to recovery room in excellent condition.  Adrian Prows MD [facility] 10/31/2019 10:58 AM

## 2019-10-31 NOTE — Transfer of Care (Signed)
Immediate Anesthesia Transfer of Care Note  Patient: Cindy Trujillo  Procedure(s) Performed: Procedure(s): DILATATION AND CURETTAGE /HYSTEROSCOPY WITH MYOSURE RESECTION (N/A)  Patient Location: PACU  Anesthesia Type:General  Level of Consciousness: sedated  Airway & Oxygen Therapy: Patient Spontanous Breathing and Patient connected to face mask oxygen  Post-op Assessment: Report given to RN and Post -op Vital signs reviewed and stable  Post vital signs: Reviewed and stable  Last Vitals:  Vitals:   10/31/19 0835 10/31/19 1036  BP: 131/71 134/75  Pulse: 79 73  Resp: 18 (!) 21  Temp: (!) 36.3 C (!) 36.2 C  SpO2: 42% 903%    Complications: No apparent anesthesia complications

## 2019-10-31 NOTE — OR Nursing (Signed)
Per Dr. Gilman Schmidt secure chat, pt may resume eliquis tomorrow.  Added to discharge instructions/med section.

## 2019-10-31 NOTE — Anesthesia Procedure Notes (Signed)
Procedure Name: LMA Insertion Date/Time: 10/31/2019 9:36 AM Performed by: Doreen Salvage, CRNA Pre-anesthesia Checklist: Patient identified, Patient being monitored, Timeout performed, Emergency Drugs available and Suction available Patient Re-evaluated:Patient Re-evaluated prior to induction Oxygen Delivery Method: Circle system utilized Preoxygenation: Pre-oxygenation with 100% oxygen Induction Type: IV induction Ventilation: Mask ventilation without difficulty LMA: LMA inserted LMA Size: 4.0 Tube type: Oral Number of attempts: 1 Placement Confirmation: positive ETCO2 and breath sounds checked- equal and bilateral Tube secured with: Tape Dental Injury: Teeth and Oropharynx as per pre-operative assessment

## 2019-10-31 NOTE — Anesthesia Preprocedure Evaluation (Signed)
Anesthesia Evaluation  Patient identified by MRN, date of birth, ID band Patient awake    Reviewed: Allergy & Precautions, H&P , NPO status , Patient's Chart, lab work & pertinent test results, reviewed documented beta blocker date and time   History of Anesthesia Complications Negative for: history of anesthetic complications  Airway Mallampati: II  TM Distance: >3 FB Neck ROM: full    Dental  (+) Caps, Dental Advidsory Given, Missing, Teeth Intact   Pulmonary neg pulmonary ROS,           Cardiovascular Exercise Tolerance: Good negative cardio ROS       Neuro/Psych negative neurological ROS  negative psych ROS   GI/Hepatic Neg liver ROS, GERD  ,  Endo/Other  diabetes, Oral Hypoglycemic Agents  Renal/GU Renal disease  negative genitourinary   Musculoskeletal   Abdominal   Peds  Hematology negative hematology ROS (+)   Anesthesia Other Findings Past Medical History: No date: Diabetes mellitus without complication (HCC) No date: GERD (gastroesophageal reflux disease) No date: Hyperlipidemia   Reproductive/Obstetrics negative OB ROS                             Anesthesia Physical  Anesthesia Plan  ASA: III  Anesthesia Plan: General   Post-op Pain Management:    Induction: Intravenous  PONV Risk Score and Plan: 3 and Ondansetron, Dexamethasone, Midazolam, Promethazine and Treatment may vary due to age or medical condition  Airway Management Planned: LMA  Additional Equipment:   Intra-op Plan:   Post-operative Plan: Extubation in OR  Informed Consent: I have reviewed the patients History and Physical, chart, labs and discussed the procedure including the risks, benefits and alternatives for the proposed anesthesia with the patient or authorized representative who has indicated his/her understanding and acceptance.     Dental Advisory Given  Plan Discussed with:  Anesthesiologist, CRNA and Surgeon  Anesthesia Plan Comments:         Anesthesia Quick Evaluation

## 2019-10-31 NOTE — Discharge Instructions (Addendum)
Hysteroscopy, Care After This sheet gives you information about how to care for yourself after your procedure. Your health care provider may also give you more specific instructions. If you have problems or questions, contact your health care provider. What can I expect after the procedure? After the procedure, it is common to have:  Cramping.  Bleeding. This can vary from light spotting to menstrual-like bleeding. Follow these instructions at home: Activity  Rest for 1-2 days after the procedure.  Do not douche, use tampons, or have sex for 2 weeks after the procedure, or until your health care provider approves.  Do not drive for 24 hours after the procedure, or for as long as told by your health care provider.  Do not drive, use heavy machinery, or drink alcohol while taking prescription pain medicines. Medicines   Take over-the-counter and prescription medicines only as told by your health care provider.  Do not take aspirin during recovery. It can increase the risk of bleeding. General instructions  Do not take baths, swim, or use a hot tub until your health care provider approves. Take showers instead of baths for 2 weeks, or for as long as told by your health care provider.  To prevent or treat constipation while you are taking prescription pain medicine, your health care provider may recommend that you: ? Drink enough fluid to keep your urine clear or pale yellow. ? Take over-the-counter or prescription medicines. ? Eat foods that are high in fiber, such as fresh fruits and vegetables, whole grains, and beans. ? Limit foods that are high in fat and processed sugars, such as fried and sweet foods.  Keep all follow-up visits as told by your health care provider. This is important. Contact a health care provider if:  You feel dizzy or lightheaded.  You feel nauseous.  You have abnormal vaginal discharge.  You have a rash.  You have pain that does not get better with  medicine.  You have chills. Get help right away if:  You have bleeding that is heavier than a normal menstrual period.  You have a fever.  You have pain or cramps that get worse.  You develop new abdominal pain.  You faint.  You have pain in your shoulders.  You have shortness of breath. Summary  After the procedure, you may have cramping and some vaginal bleeding.  Do not douche, use tampons, or have sex for 2 weeks after the procedure, or until your health care provider approves.  Do not take baths, swim, or use a hot tub until your health care provider approves. Take showers instead of baths for 2 weeks, or for as long as told by your health care provider.  Report any unusual symptoms to your health care provider.  Keep all follow-up visits as told by your health care provider. This is important. This information is not intended to replace advice given to you by your health care provider. Make sure you discuss any questions you have with your health care provider. Document Revised: 03/12/2017 Document Reviewed: 04/28/2016 Elsevier Patient Education  2020 [place]   1) The drugs that you were given will stay in your system until tomorrow so for the next 24 hours you should not:  A) Drive an automobile B) Make any legal decisions C) Drink any alcoholic beverage   2) You may resume regular meals tomorrow.  Today it is better to start with liquids and gradually work up to  solid foods.  You may eat anything you prefer, but it is better to start with liquids, then soup and crackers, and gradually work up to solid foods.   3) Please notify your doctor immediately if you have any unusual bleeding, trouble breathing, redness and pain at the surgery site, drainage, fever, or pain not relieved by medication.    4) Additional Instructions:        Please contact your physician with any problems or Same Day Surgery  at [phone], Monday through Friday 6 am to 4 pm, or [facility] number at [phone].

## 2019-11-01 ENCOUNTER — Encounter: Payer: Self-pay | Admitting: Obstetrics and Gynecology

## 2019-11-01 LAB — SURGICAL PATHOLOGY

## 2019-11-02 ENCOUNTER — Other Ambulatory Visit: Payer: Self-pay

## 2019-11-02 DIAGNOSIS — I83813 Varicose veins of bilateral lower extremities with pain: Secondary | ICD-10-CM

## 2019-11-07 ENCOUNTER — Encounter: Payer: Self-pay | Admitting: Obstetrics and Gynecology

## 2019-11-07 ENCOUNTER — Ambulatory Visit (INDEPENDENT_AMBULATORY_CARE_PROVIDER_SITE_OTHER): Payer: 59 | Admitting: Obstetrics and Gynecology

## 2019-11-07 ENCOUNTER — Other Ambulatory Visit: Payer: Self-pay

## 2019-11-07 VITALS — BP 115/74 | Ht 65.0 in | Wt 231.0 lb

## 2019-11-07 DIAGNOSIS — C541 Malignant neoplasm of endometrium: Secondary | ICD-10-CM

## 2019-11-07 NOTE — Patient Instructions (Signed)
Uterine Cancer  Uterine cancer is an abnormal growth of cancer tissue (malignant tumor) in the uterus. Unlike noncancerous (benign) tumors, malignant tumors can spread to other parts of the body. Uterine cancer usually occurs after menopause. However, it may also occur around the time that menopause begins. The wall of the uterus has an inner layer of tissue (endometrium) and an outer layer of muscle tissue (myometrium). The most common type of uterine cancer begins in the endometrium (endometrial cancer). Cancer that begins in the myometrium (uterine sarcoma) is very rare. What are the causes? The exact cause of this condition is not known. What increases the risk? You are more likely to develop this condition if you:  Are older than 50.  Have an enlarged endometrium (endometrial hyperplasia).  Use hormone therapy.  Are severely overweight (obese).  Use the medicine tamoxifen.  You are white (Caucasian).  Cannot bear children (are infertile).  Have never been pregnant.  Started menstruating at an age younger than 12 years.  Are older than 52 and are still having menstrual periods.  Have a history of cancer of the ovaries, intestines, or colon or rectum (colorectal cancer).  Have a history of enlarged ovaries with small cysts (polycystic ovarian syndrome).  Have a family history of: ? Uterine cancer. ? Hereditary nonpolyposis colon cancer (HNPCC).  Have diabetes, high blood pressure, thyroid disease, or gallbladder disease.  Use long-term, high-dose birth control pills.  Have been exposed to radiation.  Smoke. What are the signs or symptoms? Symptoms of this condition include:  Abnormal vaginal bleeding or discharge. Bleeding may start as a watery, blood-streaked flow that gradually contains more blood. This is the most common symptom. If you experience abnormal vaginal bleeding, do not assume that it is part of menopause.  Vaginal bleeding after  menopause.  Unexplained weight loss.  Bleeding between periods.  Urination that is difficult, painful, or more frequent than usual.  A lump (mass) in the vagina.  Pain, bloating, or fullness in the abdomen.  Pain in the pelvic area.  Pain during sex. How is this diagnosed? This condition may be diagnosed based on:  Your medical history and your symptoms.  A physical and pelvic exam. Your health care provider will feel your pelvis for any growths or enlarged lymph nodes.  Blood and urine tests.  Imaging tests, such as X-rays, CT scans, ultrasound, or MRIs.  A procedure in which a thin, flexible tube with a light and camera on the end is inserted through the vagina and used to look inside the uterus (hysteroscopy).  A Pap test to check for abnormal cells in the lower part of the uterus (cervix) and the upper vagina.  Removing a tissue sample (biopsy) from the uterine lining to check for cancer cells.  Dilation and curettage (D&C). This is a procedure that involves stretching (dilation) the cervix and scraping (curettage) the inside lining of the uterus to get a biopsy and check for cancer cells. Your cancer will be staged to determine its severity and extent. Staging is an assessment of:  The size of the tumor.  Whether the cancer has spread.  Where the cancer has spread. The stages of uterine cancer are as follows:  Stage I. The cancer is only found in the uterus.  Stage II. The cancer has spread to the cervix.  Stage III. The cancer has spread outside the uterus, but not outside the pelvis. The cancer may have spread to the lymph nodes in the pelvis. Lymph nodes are  part of your body's disease-fighting (immune) system. Lymph nodes are found in many locations in your body, including the neck, underarm, and groin.  Stage IV. The cancer has spread to other parts of the body, such as the bladder or rectum. How is this treated? This condition is often treated with surgery  to remove:  The uterus, cervix, fallopian tubes, and ovaries (total hysterectomy).  The uterus and cervix (simple hysterectomy). The type of hysterectomy you will have depends on the extent of your cancer. Lymph nodes near the uterus may also be removed in some cases. Treatment may also include one or more of the following:  Chemotherapy. This uses medicines to kill the cancer cells and prevent their spread.  Radiation therapy. This uses high-energy rays to kill the cancer cells and prevent the spread of cancer.  Chemoradiation. This is a combination treatment that alternates chemotherapy with radiation treatments to enhance the way radiation works.  Brachytherapy. This involves placing radioactive materials inside the body where the cancer was removed.  Hormone therapy. This includes taking medicines that lower the levels of estrogen in the body. Follow these instructions at home: Activity  Return to your normal activities as told by your health care provider. Ask your health care provider what activities are safe for you.  Exercise regularly as told by your health care provider.  Do not drive or use heavy machinery while taking prescription pain medicine. General instructions  Take over-the-counter and prescription medicines only as told by your health care provider.  Maintain a healthy diet.  Work with your health care provider to: ? Manage any long-term (chronic) conditions you have, such as diabetes, high blood pressure, thyroid disease, or gallbladder disease. ? Manage any side effects of your treatment.  Do not use any products that contain nicotine or tobacco, such as cigarettes and e-cigarettes. If you need help quitting, ask your health care provider.  Consider joining a support group to help you cope with stress. Your health care provider may be able to recommend a local or online support group.  Keep all follow-up visits as told by your health care provider. This is  important. Where to find more information  American Cancer Society: https://www.cancer.[place] https://www.cancer.gov Contact a health care provider if:  You have pain in your pelvis or abdomen that gets worse.  You cannot urinate.  You have abnormal bleeding.  You have a fever. Get help right away if:  You develop sudden or new severe symptoms, such as: ? Heavy bleeding. ? Severe weakness. ? Pain that is severe or does not get better with medicine. Summary  Uterine cancer is an abnormal growth of cancer tissue (malignant tumor) in the uterus. The most common type of uterine cancer begins in the endometrium (endometrial cancer).  This condition is often treated with surgery to remove the uterus, cervix, fallopian tubes, and ovaries (total hysterectomy) or the uterus and cervix (simple hysterectomy).  Work with your health care provider to manage any long-term (chronic) conditions you have, such as diabetes, high blood pressure, thyroid disease, or gallbladder disease.  Consider joining a support group to help you cope with stress. Your health care provider may be able to recommend a local or online support group. This information is not intended to replace advice given to you by your health care provider. Make sure you discuss any questions you have with your health care provider. Document Revised: 03/12/2017 Document Reviewed: 03/27/2016 Elsevier Patient Education  2020 [employer].

## 2019-11-07 NOTE — Progress Notes (Signed)
°  Postoperative Follow-up Patient presents post op from operative hysteroscopy for abnormal uterine bleeding, 1 week ago.  Subjective: Patient reports marked improvement in her preop symptoms. Eating a regular diet without difficulty. The patient is not having any pain.  Activity: normal activities of daily living. Patient reports additional symptom's since surgery of None.  Objective: BP 115/74    Ht 5' 5"  (1.651 m)    Wt (!) 231 lb (104.8 kg)    BMI 38.44 kg/m  Physical Exam Constitutional:      Appearance: She is well-developed.  Genitourinary:     Vagina and uterus normal.     No lesions in the vagina.     No cervical motion tenderness.     No right or left adnexal mass present.  HENT:     Head: Normocephalic and atraumatic.  Neck:     Thyroid: No thyromegaly.  Cardiovascular:     Rate and Rhythm: Normal rate and regular rhythm.     Heart sounds: Normal heart sounds.  Pulmonary:     Effort: Pulmonary effort is normal.     Breath sounds: Normal breath sounds.  Chest:     Breasts:        Right: No inverted nipple, mass, nipple discharge or skin change.        Left: No inverted nipple, mass, nipple discharge or skin change.  Abdominal:     General: Bowel sounds are normal. There is no distension.     Palpations: Abdomen is soft. There is no mass.  Musculoskeletal:     Cervical back: Neck supple.  Neurological:     Mental Status: She is alert and oriented to person, place, and time.  Skin:    General: Skin is warm and dry.  Psychiatric:        Behavior: Behavior normal.        Thought Content: Thought content normal.        Judgment: Judgment normal.  Vitals reviewed.     Assessment: s/p :  operative hysteroscopy stable  Plan: Patient has done well after surgery with no apparent complications.  I have discussed the post-operative course to date, and the expected progress moving forward.  The patient understands what complications to be concerned about.  I will see  the patient in routine follow up, or sooner if needed.    Referral to GYN ONC made for pathology result.  Activity plan: No restriction.  Pelvic rest.  Cindy Trujillo R Cindy Trujillo 11/07/2019, 1:48 PM

## 2019-11-15 ENCOUNTER — Telehealth: Payer: Self-pay | Admitting: Nurse Practitioner

## 2019-11-15 NOTE — Telephone Encounter (Signed)
Called patient to schedule new patient appointment based on referral from Dr. Gilman Schmidt for endometrial cancer. Left message to return the call.

## 2019-11-16 ENCOUNTER — Other Ambulatory Visit: Payer: Self-pay

## 2019-11-16 ENCOUNTER — Ambulatory Visit (HOSPITAL_COMMUNITY)
Admission: RE | Admit: 2019-11-16 | Discharge: 2019-11-16 | Disposition: A | Discharge status: Home / Self Care | Payer: 59 | Source: Ambulatory Visit | Attending: Vascular Surgery | Admitting: Vascular Surgery

## 2019-11-16 ENCOUNTER — Ambulatory Visit: Payer: 59 | Admitting: Vascular Surgery

## 2019-11-16 ENCOUNTER — Encounter: Payer: Self-pay | Admitting: Vascular Surgery

## 2019-11-16 VITALS — BP 123/79 | HR 84 | Temp 97.9°F | Resp 18 | Ht 64.0 in | Wt 229.4 lb

## 2019-11-16 DIAGNOSIS — I83813 Varicose veins of bilateral lower extremities with pain: Secondary | ICD-10-CM | POA: Insufficient documentation

## 2019-11-16 DIAGNOSIS — I83812 Varicose veins of left lower extremities with pain: Secondary | ICD-10-CM

## 2019-11-16 DIAGNOSIS — I872 Venous insufficiency (chronic) (peripheral): Secondary | ICD-10-CM | POA: Diagnosis not present

## 2019-11-16 NOTE — Progress Notes (Signed)
REASON FOR CONSULT:    Bilateral varicose veins with left leg pain.  The consult is requested by Dr. Lavon Paganini.  ASSESSMENT & PLAN:   PAINFUL VARICOSE VEINS LEFT LOWER EXTREMITY: This patient has CEAP C4c venous disease.  She has painful varicose veins and persistent symptoms despite ablation of a saphenous vein in the left leg and conservative measures including thigh-high compression stockings and elevation.  Given that she is having persistent symptoms she would like to to proceed with laser ablation of the duplicate system in addition to 10-20 stab phlebectomies.  She would also require 2 units of sclerotherapy for the varicosities around her ankle.  She is scheduled for uterine surgery would like to have this addressed first understandably.  We will then try to schedule her venous procedure after she has recovered from this surgery.  I would likely cannulate the vein in the distal thigh.  I have discussed the indications for endovenous laser ablation of the left GSV, that is to lower the pressure in the veins and potentially help relieve the symptoms from venous hypertension. I have also discussed alternative options including conservative treatment with leg elevation, compression therapy, exercise, avoiding prolonged sitting and standing, and weight management. I have discussed the potential complications of the procedure, including, but not limited to: bleeding, bruising, leg swelling, nerve injury, skin burns, significant pain from phlebitis, deep venous thrombosis, or failure of the vein to close.  I have also explained that venous insufficiency is a chronic disease, and that the patient is at risk for recurrent varicose veins in the future.  All of the patient's questions were encouraged and answered. They are agreeable to proceed.   I have discussed with the patient the indications for stab phlebectomy.  I have explained to the patient that that will have small scars from the stab  incisions.  I explained that the other risks include leg swelling, bruising, bleeding, and phlebitis.  All the patient's questions were encouraged and answered and they are agreeable to proceed.  Deitra Mayo, MD Office: [phone]   HPI:   Cindy Trujillo is a pleasant 64 y.o. female, who underwent laser ablation of the left great saphenous vein at [facility] in January of this year.  I reviewed the follow-up venous duplex scan that was done there on 05/08/2019.  This showed successful vein closure however there was an additional vessel running parallel to the close great saphenous vein.  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  She continues to have aching pain and heaviness in the left leg which is worse at the end of the day.  She is on her feet for long hours.  She has been wearing her thigh-high compression stockings and elevating her legs.  The patient does have a history of a DVT in the right popliteal vein in May 2020 and has been on Eliquis.  She was recently diagnosed with uterine cancer and is scheduled for surgery in the near future.  Past Medical History:  Diagnosis Date  . Diabetes mellitus without complication ([place])   . DVT (deep venous thrombosis) ([place]) 08/2018   right leg  . GERD (gastroesophageal reflux disease)   . Hyperlipidemia     Family History  Problem Relation Age of Onset  . COPD Mother        heavy smoker  . Pulmonary fibrosis Mother   . Fibromyalgia Mother   . COPD Father   . Melanoma Father   . COPD Sister   .  COPD Brother   . COPD Sister   . COPD Sister   . Healthy Brother   . Heart failure Maternal Grandmother   . Heart attack Maternal Grandfather   . Cancer Paternal Grandmother        unsure of type, had mets in colon  . Heart attack Paternal Grandfather   . Cancer Paternal Grandfather        unknown  . Breast cancer Neg Hx   . Cervical cancer Neg Hx     SOCIAL HISTORY: Social History   Socioeconomic History  . Marital status:  Married    Spouse name: Francee Piccolo  . Number of children: 0  . Years of education: some college  . Highest education level: Not on file  Occupational History  . Occupation: CARE MANGEMENT    Employer: [employer]  Tobacco Use  . Smoking status: Never Smoker  . Smokeless tobacco: Never Used  Vaping Use  . Vaping Use: Never used  Substance and Sexual Activity  . Alcohol use: No    Alcohol/week: 0.0 standard drinks  . Drug use: No  . Sexual activity: Not Currently  Other Topics Concern  . Not on file  Social History Narrative  . Not on file   Social Determinants of Health   Financial Resource Strain:   . Difficulty of Paying Living Expenses:   Food Insecurity:   . Worried About Charity fundraiser in the Last Year:   . Arboriculturist in the Last Year:   Transportation Needs:   . Film/video editor (Medical):   Marland Kitchen Lack of Transportation (Non-Medical):   Physical Activity:   . Days of Exercise per Week:   . Minutes of Exercise per Session:   Stress:   . Feeling of Stress :   Social Connections:   . Frequency of Communication with Friends and Family:   . Frequency of Social Gatherings with Friends and Family:   . Attends Religious Services:   . Active Member of Clubs or Organizations:   . Attends Archivist Meetings:   Marland Kitchen Marital Status:   Intimate Partner Violence:   . Fear of Current or Ex-Partner:   . Emotionally Abused:   Marland Kitchen Physically Abused:   . Sexually Abused:     Allergies  Allergen Reactions  . Sulfa Antibiotics Hives    Current Outpatient Medications  Medication Sig Dispense Refill  . acetaminophen (TYLENOL) 500 MG tablet Take 500 mg by mouth every 6 (six) hours as needed (for pain.).    Marland Kitchen apixaban (ELIQUIS) 5 MG TABS tablet Take 1 tablet (5 mg total) by mouth 2 (two) times daily. (Patient taking differently: Take 2.5 mg by mouth 2 (two) times daily. ) 60 tablet 3  . Blood Glucose Monitoring Suppl (FREESTYLE LITE) DEVI     .  dapagliflozin propanediol (FARXIGA) 10 MG TABS tablet Take 10 mg by mouth daily. (Patient taking differently: Take 10 mg by mouth daily at 6 PM. ) 90 tablet 3  . ferrous sulfate 325 (65 FE) MG tablet Take 325 mg by mouth 3 (three) times a week.     Marland Kitchen FREESTYLE LITE test strip     . FreeStyle Unistick II Lancets MISC Use as instructed to check blood glucose daily 100 each 5  . glucose blood (FREESTYLE TEST STRIPS) test strip Use as instructed to check blood glucose daily 100 each 12  . glucose blood test strip     . glucose monitoring kit (  FREESTYLE) monitoring kit 1 each by Does not apply route as needed for other. 1 each 0  . Lancets (FREESTYLE) lancets     . lisinopril (ZESTRIL) 5 MG tablet Take 1 tablet (5 mg total) by mouth daily. (Patient taking differently: Take 5 mg by mouth daily with supper. ) 90 tablet 3  . loratadine (CLARITIN) 10 MG tablet Take 1 tablet (10 mg total) by mouth daily. 90 tablet 3  . metFORMIN (GLUCOPHAGE) 1000 MG tablet Take 1 tablet (1,000 mg total) by mouth 2 (two) times daily with a meal. 180 tablet 3  . Multiple Vitamin (MULTIVITAMIN WITH MINERALS) TABS tablet Take 1 tablet by mouth 2 (two) times a week.    Marland Kitchen omeprazole (PRILOSEC) 20 MG capsule TAKE 1 CAPSULE (20 MG TOTAL) BY MOUTH DAILY. 90 capsule 3  . rosuvastatin (CRESTOR) 5 MG tablet Take 1 tablet (5 mg total) by mouth daily. (Patient taking differently: Take 5 mg by mouth every evening. ) 90 tablet 3  . vitamin B-12 (CYANOCOBALAMIN) 1000 MCG tablet Take 1 tablet (1,000 mcg total) by mouth daily. 90 tablet 1  . traMADol (ULTRAM) 50 MG tablet Take 1 tablet (50 mg total) by mouth every 6 (six) hours as needed. (Patient not taking: Reported on 11/16/2019) 10 tablet 1   No current facility-administered medications for this visit.    REVIEW OF SYSTEMS:  [X]  denotes positive finding, [ ]  denotes negative finding Cardiac  Comments:  Chest pain or chest pressure:    Shortness of breath upon exertion:    Short of  breath when lying flat:    Irregular heart rhythm:        Vascular    Pain in calf, thigh, or hip brought on by ambulation:    Pain in feet at night that wakes you up from your sleep:     Blood clot in your veins:    Leg swelling:  x       Pulmonary    Oxygen at home:    Productive cough:     Wheezing:         Neurologic    Sudden weakness in arms or legs:     Sudden numbness in arms or legs:     Sudden onset of difficulty speaking or slurred speech:    Temporary loss of vision in one eye:     Problems with dizziness:         Gastrointestinal    Blood in stool:     Vomited blood:         Genitourinary    Burning when urinating:     Blood in urine:        Psychiatric    Major depression:         Hematologic    Bleeding problems:    Problems with blood clotting too easily:        Skin    Rashes or ulcers:        Constitutional    Fever or chills:     PHYSICAL EXAM:   Vitals:   11/16/19 1521  BP: 123/79  Pulse: 84  Resp: 18  Temp: 97.9 F (36.6 C)  TempSrc: Temporal  SpO2: 98%  Weight: 104.1 kg  Height: 5' 4"  (1.626 m)   Body mass index is 39.38 kg/m.  GENERAL: The patient is a well-nourished female, in no acute distress. The vital signs are documented above. CARDIAC: There is a regular rate and rhythm.  VASCULAR: I do not  detect carotid bruits. She has palpable pedal pulses. She has some dilated varicose veins in the medial distal left thigh in the medial calf.      The patient has corona phlebectatic around the left ankle.    I looked at her left great saphenous vein myself with the SonoSite and is clearly a patent great saphenous vein in the distal thigh up to the groin.  I do not think this is an anterior accessory saphenous vein.  Given that the patient is undergone previous ablation I think that she must of had a duplicate system. PULMONARY: There is good air exchange bilaterally without wheezing or rales. ABDOMEN: Soft and non-tender with  normal pitched bowel sounds.  MUSCULOSKELETAL: There are no major deformities or cyanosis. NEUROLOGIC: No focal weakness or paresthesias are detected. SKIN: There are no ulcers or rashes noted. PSYCHIATRIC: The patient has a normal affect.  DATA:    VENOUS DUPLEX: I have independently interpreted the patient's venous duplex scan today.  On the left side, there is no evidence of DVT or superficial venous thrombosis.  There is deep venous reflux involving the common femoral vein.  There is superficial venous reflux in the great saphenous vein from the saphenofemoral junction to the distal thigh.  The diameters of the veins are 0.6 cm throughout.  A total of 60 minutes was spent on this visit. 30 minutes was face to face time. More than 50% of the time was spent on counseling and coordinating with the patient.

## 2019-11-22 ENCOUNTER — Telehealth: Payer: Self-pay | Admitting: Family Medicine

## 2019-11-22 ENCOUNTER — Inpatient Hospital Stay: Payer: 59 | Attending: Obstetrics and Gynecology | Admitting: Obstetrics and Gynecology

## 2019-11-22 ENCOUNTER — Telehealth: Payer: Self-pay | Admitting: *Deleted

## 2019-11-22 ENCOUNTER — Encounter: Payer: Self-pay | Admitting: Obstetrics and Gynecology

## 2019-11-22 ENCOUNTER — Inpatient Hospital Stay: Payer: 59

## 2019-11-22 ENCOUNTER — Other Ambulatory Visit: Payer: Self-pay

## 2019-11-22 VITALS — BP 123/69 | HR 94 | Temp 98.7°F | Resp 18 | Wt 232.3 lb

## 2019-11-22 DIAGNOSIS — D696 Thrombocytopenia, unspecified: Secondary | ICD-10-CM | POA: Diagnosis not present

## 2019-11-22 DIAGNOSIS — E785 Hyperlipidemia, unspecified: Secondary | ICD-10-CM | POA: Insufficient documentation

## 2019-11-22 DIAGNOSIS — E119 Type 2 diabetes mellitus without complications: Secondary | ICD-10-CM | POA: Insufficient documentation

## 2019-11-22 DIAGNOSIS — Z7984 Long term (current) use of oral hypoglycemic drugs: Secondary | ICD-10-CM | POA: Insufficient documentation

## 2019-11-22 DIAGNOSIS — Z86718 Personal history of other venous thrombosis and embolism: Secondary | ICD-10-CM | POA: Diagnosis not present

## 2019-11-22 DIAGNOSIS — Z6839 Body mass index (BMI) 39.0-39.9, adult: Secondary | ICD-10-CM | POA: Insufficient documentation

## 2019-11-22 DIAGNOSIS — C541 Malignant neoplasm of endometrium: Secondary | ICD-10-CM

## 2019-11-22 DIAGNOSIS — Z7901 Long term (current) use of anticoagulants: Secondary | ICD-10-CM | POA: Insufficient documentation

## 2019-11-22 DIAGNOSIS — Z79899 Other long term (current) drug therapy: Secondary | ICD-10-CM | POA: Insufficient documentation

## 2019-11-22 DIAGNOSIS — K219 Gastro-esophageal reflux disease without esophagitis: Secondary | ICD-10-CM | POA: Insufficient documentation

## 2019-11-22 NOTE — H&P (Signed)
Gynecologic Oncology History and Physical  Referring Provider: Adrian Prows, MD  Chief Concern: endometrial cancer  Subjective:  Cindy Trujillo is a 64 y.o. female who is seen in consultation from Dr. Gilman Schmidt for endometrial cancer who presented initially with complaints of postmenopausal bleeding.   She reported PMB which has been occurring on and off since June of 2021.  Evaluation included the following:   Pelvic US 09/21/2019 FINDINGS: Uterus: measurements: 8.6 x 3.2 x 4.9 cm = volume: 71.8 mL. Uterus is anteverted. No fibroids or other mass. Endometrium: Focal endometrial lesion/thickening measuring 1.1 x 0.9 x 1.4 cm seen within the central aspect of the endometrial complex. Associated scattered areas of internal vascularity. Elsewhere, the endometrial stripe is thin measuring no more than 2 mm.  Right ovary: Not visualized.  No adnexal mass. Left ovary: Measurements: 1.9 x 1.5 x 1.9 cm = volume: 1.7 mL. Normal appearance/no adnexal mass. IMPRESSION: 1. 1.1 x 0.9 x 1.4 cm focal endometrial lesion/thickening as above. Finding is nonspecific, with primary differential considerations including an endometrial polyp, focal endometrial hyperplasia, or possibly endometrial carcinoma. In the setting of post-menopausal bleeding, endometrial sampling is indicated to exclude carcinoma. If results are benign, sonohysterogram should be considered for focal lesion work-up prior to hysteroscopy. (Ref: Radiological Reasoning: Algorithmic Workup of Abnormal Vaginal Bleeding with Endovaginal Sonography and Sonohysterography. AJR 2008; 295:M84-13). 2. Otherwise normal sonographic appearance of the uterus. 3. Normal left ovary, with nonvisualization of the right ovary. No adnexal mass or free fluid.  Pap 09/21/2019 NILM; HRHPV negative  10/31/2019 D&C with Myosure Uterus sounded to 9 cm.  Hysteroscopy revealed polypoid tissue growing from the right cornua.  Pathology A. ENDOMETRIUM, CURETTAGE:   - ENDOMETRIAL ADENOCARCINOMA, ENDOMETRIOID TYPE, FIGO GRADE 1.  - BACKGROUND EIN / ATYPICAL HYPERPLASIA.  Of note she had a DVT in May of 2020. Currently on Eliquis. DVT was found incidentally to her procedure for varicose veins.   She also has h/o low platelets and follows up with hematology/oncology for a bone marrow biopsy.   She presents today for evaluation.   Problem List: Patient Active Problem List   Diagnosis Date Noted  . Post-menopausal bleeding 09/21/2019  . Cervical cancer screening 09/21/2019  . Annual physical exam 09/21/2019  . DVT (deep venous thrombosis) ([place]) 11/29/2018  . Acute deep vein thrombosis (DVT) of femoral vein of right lower extremity ([place]) 10/12/2018  . Microalbuminuria due to type 2 diabetes mellitus ([place]) 06/23/2018  . Varicose veins of leg with pain, bilateral 05/20/2018  . Varicose veins of both lower extremities 08/27/2017  . T2DM (type 2 diabetes mellitus) ([place])   . Hyperlipidemia associated with type 2 diabetes mellitus ([place])   . Allergic rhinitis 12/11/2016  . GERD (gastroesophageal reflux disease) 12/11/2016  . Morbid obesity ([place]) 12/11/2016    Past Medical History: Past Medical History:  Diagnosis Date  . Diabetes mellitus without complication ([place])   . DVT (deep venous thrombosis) ([place]) 08/2018   right leg  . GERD (gastroesophageal reflux disease)   . Hyperlipidemia     Past Surgical History: Past Surgical History:  Procedure Laterality Date  . COLONOSCOPY WITH PROPOFOL N/A 08/23/2017   Procedure: COLONOSCOPY WITH PROPOFOL;  Surgeon: Manya Silvas, MD;  Location[facility] ENDOSCOPY;  Service: Endoscopy;  Laterality: N/A;  . HYSTEROSCOPY WITH D & C N/A 10/31/2019   Procedure: DILATATION AND CURETTAGE /HYSTEROSCOPY WITH MYOSURE RESECTION;  Surgeon: Homero Fellers, MD;  Location: [facility] ORS;  Service: Gynecology;  Laterality: N/A;  . TONSILLECTOMY    .  TONSILLECTOMY AND ADENOIDECTOMY  1968  . VEIN SURGERY  05/2019   laser     Past Gynecologic History:  Gynecological History Menarche: 49 Menopause: 49 Describes periods as regular, monthly Last pap smear: 09/21/2019- NIL Last Mammogram: None, patient only has clinical exams- declines mammogram  Hx OCP usage for for 15-18 years Hx obesity for 20 years   OB History:  G0P0 OB History  Gravida Para Term Preterm AB Living  0 0 0 0 0 0  SAB TAB Ectopic Multiple Live Births  0 0 0 0 0    Family History: Family History  Problem Relation Age of Onset  . COPD Mother        heavy smoker  . Pulmonary fibrosis Mother   . Fibromyalgia Mother   . COPD Father   . Melanoma Father   . COPD Sister   . COPD Brother   . COPD Sister   . COPD Sister   . Healthy Brother   . Heart failure Maternal Grandmother   . Heart attack Maternal Grandfather   . Cancer Paternal Grandmother        unsure of type, had mets in colon  . Heart attack Paternal Grandfather   . Cancer Paternal Grandfather        unknown  . Breast cancer Neg Hx   . Cervical cancer Neg Hx     Social History: Retired worked a a Production assistant, radio Social History   Socioeconomic History  . Marital status: Married    Spouse name: Francee Piccolo  . Number of children: 0  . Years of education: some college  . Highest education level: Not on file  Occupational History  . Occupation: CARE MANGEMENT    Employer: [employer]  Tobacco Use  . Smoking status: Never Smoker  . Smokeless tobacco: Never Used  Vaping Use  . Vaping Use: Never used  Substance and Sexual Activity  . Alcohol use: No    Alcohol/week: 0.0 standard drinks  . Drug use: No  . Sexual activity: Not Currently  Other Topics Concern  . Not on file  Social History Narrative  . Not on file   Social Determinants of Health   Financial Resource Strain:   . Difficulty of Paying Living Expenses:   Food Insecurity:   . Worried About Charity fundraiser in the Last Year:   . Arboriculturist in the Last  Year:   Transportation Needs:   . Film/video editor (Medical):   Marland Kitchen Lack of Transportation (Non-Medical):   Physical Activity:   . Days of Exercise per Week:   . Minutes of Exercise per Session:   Stress:   . Feeling of Stress :   Social Connections:   . Frequency of Communication with Friends and Family:   . Frequency of Social Gatherings with Friends and Family:   . Attends Religious Services:   . Active Member of Clubs or Organizations:   . Attends Archivist Meetings:   Marland Kitchen Marital Status:   Intimate Partner Violence:   . Fear of Current or Ex-Partner:   . Emotionally Abused:   Marland Kitchen Physically Abused:   . Sexually Abused:     Allergies: Allergies  Allergen Reactions  . Sulfa Antibiotics Hives    Current Medications: Current Outpatient Medications  Medication Sig Dispense Refill  . acetaminophen (TYLENOL) 500 MG tablet Take 500 mg by mouth every 6 (six) hours as needed (for pain.).    Marland Kitchen  apixaban (ELIQUIS) 5 MG TABS tablet Take 1 tablet (5 mg total) by mouth 2 (two) times daily. (Patient taking differently: Take 2.5 mg by mouth 2 (two) times daily. ) 60 tablet 3  . Blood Glucose Monitoring Suppl (FREESTYLE LITE) DEVI     . dapagliflozin propanediol (FARXIGA) 10 MG TABS tablet Take 10 mg by mouth daily. (Patient taking differently: Take 10 mg by mouth daily at 6 PM. ) 90 tablet 3  . ferrous sulfate 325 (65 FE) MG tablet Take 325 mg by mouth 3 (three) times a week.     Marland Kitchen FREESTYLE LITE test strip     . FreeStyle Unistick II Lancets MISC Use as instructed to check blood glucose daily 100 each 5  . glucose blood (FREESTYLE TEST STRIPS) test strip Use as instructed to check blood glucose daily 100 each 12  . glucose blood test strip     . glucose monitoring kit (FREESTYLE) monitoring kit 1 each by Does not apply route as needed for other. 1 each 0  . Lancets (FREESTYLE) lancets     . lisinopril (ZESTRIL) 5 MG tablet Take 1 tablet (5 mg total) by mouth daily. (Patient  taking differently: Take 5 mg by mouth daily with supper. ) 90 tablet 3  . loratadine (CLARITIN) 10 MG tablet Take 1 tablet (10 mg total) by mouth daily. 90 tablet 3  . metFORMIN (GLUCOPHAGE) 1000 MG tablet Take 1 tablet (1,000 mg total) by mouth 2 (two) times daily with a meal. 180 tablet 3  . Multiple Vitamin (MULTIVITAMIN WITH MINERALS) TABS tablet Take 1 tablet by mouth 2 (two) times a week.    Marland Kitchen omeprazole (PRILOSEC) 20 MG capsule TAKE 1 CAPSULE (20 MG TOTAL) BY MOUTH DAILY. 90 capsule 3  . rosuvastatin (CRESTOR) 5 MG tablet Take 1 tablet (5 mg total) by mouth daily. (Patient taking differently: Take 5 mg by mouth every evening. ) 90 tablet 3  . vitamin B-12 (CYANOCOBALAMIN) 1000 MCG tablet Take 1 tablet (1,000 mcg total) by mouth daily. 90 tablet 1  . traMADol (ULTRAM) 50 MG tablet Take 1 tablet (50 mg total) by mouth every 6 (six) hours as needed. (Patient not taking: Reported on 11/22/2019) 10 tablet 1   No current facility-administered medications for this visit.    Review of Systems  General: negative for fevers, changes in weight or night sweats Skin: negative for changes in moles or sores or rash Eyes: negative for changes in vision HEENT: negative for change in hearing, tinnitus, voice changes Pulmonary: negative for dyspnea, orthopnea, productive cough, wheezing Cardiac: negative for palpitations, pain Gastrointestinal: negative for nausea, vomiting, constipation, diarrhea, hematemesis, hematochezia Genitourinary/Sexual: negative for dysuria, retention, hematuria, incontinence Ob/Gyn:  abnormal bleeding o/w negative for pain Musculoskeletal: negative for pain, joint pain, back pain Hematology: negative for easy bruising, abnormal bleeding Neurologic/Psych: negative for headaches, seizures, paralysis, weakness, numbness  Objective:  Physical Examination:  BP 123/69 (BP Location: Right Arm, Patient Position: Sitting)   Pulse 94   Temp 98.7 F (37.1 C) (Tympanic)   Resp 18    Wt 232 lb 4.8 oz (105.4 kg)   BMI 39.87 kg/m    ECOG Performance Status: 0 - Asymptomatic  GENERAL: Patient is a well appearing female in no acute distress HEENT:  PERRL, neck supple with midline trachea. Thyroid without masses.  NODES:  No cervical, supraclavicular, axillary, or inguinal lymphadenopathy palpated.  LUNGS:  Clear to auscultation bilaterally.   HEART:  Regular rate and rhythm. ABDOMEN:  Soft, nontender,  nondistended. No ascites/hernia/masses. Exam limited by habitus.  EXTREMITIES:  No peripheral edema.   SKIN:  Clear with no obvious rashes or skin changes.  NEURO:  Nonfocal. Well oriented.  Appropriate affect.   Pelvic: exam chaperoned by nurse;  Vulva: normal appearing vulva with no masses, tenderness or lesions; Vagina: normal vagina; Adnexa:  nontender and no masses but limited by habitus; Uterus: nontender unable to determine size due to habitus; Cervix: no lesions; Rectal: not indicated    Lab Review Labs pending  Lab Results  Component Value Date   WBC 4.1 10/31/2019   HGB 11.5 (L) 10/31/2019   HCT 35.1 (L) 10/31/2019   MCV 81.3 10/31/2019   PLT 115 (L) 10/31/2019   TSH=2.75 normal on 09/21/2019   Radiologic Imaging: As per HPI    Assessment:  Cindy Trujillo is a 64 y.o. female diagnosed with grade 1 endometrioid  endometrial cancer s/p D&C with Myosure resection.   Medical co-morbidities complicating care: DVT on anti-coagulation, GERD, diabetes, prior abdominal surgery and immunocompromised. Body mass index is 39.87 kg/m.  Plan:   Problem List Items Addressed This Visit    None    Visit Diagnoses    Thrombocytopenia ([place])    -  Primary   Relevant Orders   CBC with Differential/Platelet   Comprehensive metabolic panel   Hemoglobin A1c   Endometrial cancer ([place])       Relevant Orders   CBC with Differential/Platelet   Comprehensive metabolic panel   Hemoglobin A1c   History of DVT (deep vein thrombosis)          We discussed  options for management including   A long discussion was held with the patient today about her endometrioid carcinoma of the endometrium.  We recommend that she undergo surgical treatment with hysterectomy and bilateral salpingoophorectomy via minimally invasive laparoscopic/robotic surgery.  We discussed the risks and benefits of surgical staging with pelvic washings, pelvic and para-aortic lymph node sampling and the use of sentinel node biopsy. We reviewed alternative options such as radiation and hormonal therapy but cure rates are lower with both those approaches. She has opted for surgery.   The risks of surgery were discussed in detail and she understands these to include infection; wound separation; hernia; vaginal cuff separation, injury to adjacent organs such as bowel, bladder, blood vessels, ureters and nerves; bleeding which may require blood transfusion; anesthesia risk; thromboembolic events; possible death; unforeseen complications; possible need for re-exploration; medical complications such as heart attack, stroke, pleural effusion and pneumonia; and, if staging performed the risk of lymphedema and lymphocyst.  The patient will receive DVT and antibiotic prophylaxis and treatment as indicated.  She voiced a clear understanding.  She had the opportunity to ask questions and written informed consent was obtained today.  Check labs CBC, CMP, and HbA1c.  Suggested return to clinic in  4-6 weeks postop.    Gyn VTE Prophylaxis Algorithm  Risk factors for VTE (calculator):  Point value Risk factors  1 point each none in this category   2 points each age 52-74 BMI >30  3 points each personal/family history of VTE  5 points each  none in this category   Major surgery (>30 min); known malignancy or concern for malignancy (elevated tumor markers); minimally invasive surgery.  Risk assessment: 5+ points; Very high risk - heparin/UFH and SCDs and prophylactic dosing lovenox for 2 days  (admission for 2 nights) and then restart on therapeutic Eliquis on extended anticoagulation for 28 days.  Further Eliquis management will be based on Dr. Collie Siad assessment of thrombophilia. Eliquis to be stopped at least 2 days before surgery.   The patient's diagnosis, an outline of the further diagnostic and laboratory studies which will be required, the recommendation, and alternatives were discussed.  All questions were answered to the patient's satisfaction.  A total of over 80 minutes were spent with the patient/family today; >50% was spent in education, counseling and coordination of care for endometrial cancer.    Tilden Broz Gaetana Michaelis, MD    CC:  Dr. Adrian Prows

## 2019-11-22 NOTE — Telephone Encounter (Signed)
Spoke to Dr Theora Gianotti.  Patient will see Dr Tasia Catchings again regarding hemophilia work-up and determination of anti-coag needs.

## 2019-11-22 NOTE — Telephone Encounter (Signed)
FOR DR [facility] calling/ Pt in clinic/ pls call Caro Hight at [phone] regard pt  apixaban (ELIQUIS) 5 MG TABS tablet

## 2019-11-22 NOTE — H&P (View-Only) (Signed)
Gynecologic Oncology History and Physical  Referring Provider: Adrian Prows, MD  Chief Concern: endometrial cancer  Subjective:  Cindy Trujillo is a 64 y.o. female who is seen in consultation from Dr. Gilman Schmidt for endometrial cancer who presented initially with complaints of postmenopausal bleeding.   She reported PMB which has been occurring on and off since June of 2021.  Evaluation included the following:   Pelvic US 09/21/2019 FINDINGS: Uterus: measurements: 8.6 x 3.2 x 4.9 cm = volume: 71.8 mL. Uterus is anteverted. No fibroids or other mass. Endometrium: Focal endometrial lesion/thickening measuring 1.1 x 0.9 x 1.4 cm seen within the central aspect of the endometrial complex. Associated scattered areas of internal vascularity. Elsewhere, the endometrial stripe is thin measuring no more than 2 mm.  Right ovary: Not visualized.  No adnexal mass. Left ovary: Measurements: 1.9 x 1.5 x 1.9 cm = volume: 1.7 mL. Normal appearance/no adnexal mass. IMPRESSION: 1. 1.1 x 0.9 x 1.4 cm focal endometrial lesion/thickening as above. Finding is nonspecific, with primary differential considerations including an endometrial polyp, focal endometrial hyperplasia, or possibly endometrial carcinoma. In the setting of post-menopausal bleeding, endometrial sampling is indicated to exclude carcinoma. If results are benign, sonohysterogram should be considered for focal lesion work-up prior to hysteroscopy. (Ref: Radiological Reasoning: Algorithmic Workup of Abnormal Vaginal Bleeding with Endovaginal Sonography and Sonohysterography. AJR 2008; 295:M84-13). 2. Otherwise normal sonographic appearance of the uterus. 3. Normal left ovary, with nonvisualization of the right ovary. No adnexal mass or free fluid.  Pap 09/21/2019 NILM; HRHPV negative  10/31/2019 D&C with Myosure Uterus sounded to 9 cm.  Hysteroscopy revealed polypoid tissue growing from the right cornua.  Pathology A. ENDOMETRIUM, CURETTAGE:   - ENDOMETRIAL ADENOCARCINOMA, ENDOMETRIOID TYPE, FIGO GRADE 1.  - BACKGROUND EIN / ATYPICAL HYPERPLASIA.  Of note she had a DVT in May of 2020. Currently on Eliquis. DVT was found incidentally to her procedure for varicose veins.   She also has h/o low platelets and follows up with hematology/oncology for a bone marrow biopsy.   She presents today for evaluation.   Problem List: Patient Active Problem List   Diagnosis Date Noted  . Post-menopausal bleeding 09/21/2019  . Cervical cancer screening 09/21/2019  . Annual physical exam 09/21/2019  . DVT (deep venous thrombosis) ([place]) 11/29/2018  . Acute deep vein thrombosis (DVT) of femoral vein of right lower extremity ([place]) 10/12/2018  . Microalbuminuria due to type 2 diabetes mellitus ([place]) 06/23/2018  . Varicose veins of leg with pain, bilateral 05/20/2018  . Varicose veins of both lower extremities 08/27/2017  . T2DM (type 2 diabetes mellitus) ([place])   . Hyperlipidemia associated with type 2 diabetes mellitus ([place])   . Allergic rhinitis 12/11/2016  . GERD (gastroesophageal reflux disease) 12/11/2016  . Morbid obesity ([place]) 12/11/2016    Past Medical History: Past Medical History:  Diagnosis Date  . Diabetes mellitus without complication ([place])   . DVT (deep venous thrombosis) ([place]) 08/2018   right leg  . GERD (gastroesophageal reflux disease)   . Hyperlipidemia     Past Surgical History: Past Surgical History:  Procedure Laterality Date  . COLONOSCOPY WITH PROPOFOL N/A 08/23/2017   Procedure: COLONOSCOPY WITH PROPOFOL;  Surgeon: Manya Silvas, MD;  Location[facility] ENDOSCOPY;  Service: Endoscopy;  Laterality: N/A;  . HYSTEROSCOPY WITH D & C N/A 10/31/2019   Procedure: DILATATION AND CURETTAGE /HYSTEROSCOPY WITH MYOSURE RESECTION;  Surgeon: Homero Fellers, MD;  Location: [facility] ORS;  Service: Gynecology;  Laterality: N/A;  . TONSILLECTOMY    .  TONSILLECTOMY AND ADENOIDECTOMY  1968  . VEIN SURGERY  05/2019   laser     Past Gynecologic History:  Gynecological History Menarche: 49 Menopause: 49 Describes periods as regular, monthly Last pap smear: 09/21/2019- NIL Last Mammogram: None, patient only has clinical exams- declines mammogram  Hx OCP usage for for 15-18 years Hx obesity for 20 years   OB History:  G0P0 OB History  Gravida Para Term Preterm AB Living  0 0 0 0 0 0  SAB TAB Ectopic Multiple Live Births  0 0 0 0 0    Family History: Family History  Problem Relation Age of Onset  . COPD Mother        heavy smoker  . Pulmonary fibrosis Mother   . Fibromyalgia Mother   . COPD Father   . Melanoma Father   . COPD Sister   . COPD Brother   . COPD Sister   . COPD Sister   . Healthy Brother   . Heart failure Maternal Grandmother   . Heart attack Maternal Grandfather   . Cancer Paternal Grandmother        unsure of type, had mets in colon  . Heart attack Paternal Grandfather   . Cancer Paternal Grandfather        unknown  . Breast cancer Neg Hx   . Cervical cancer Neg Hx     Social History: Retired worked a a Production assistant, radio Social History   Socioeconomic History  . Marital status: Married    Spouse name: Francee Piccolo  . Number of children: 0  . Years of education: some college  . Highest education level: Not on file  Occupational History  . Occupation: CARE MANGEMENT    Employer: [employer]  Tobacco Use  . Smoking status: Never Smoker  . Smokeless tobacco: Never Used  Vaping Use  . Vaping Use: Never used  Substance and Sexual Activity  . Alcohol use: No    Alcohol/week: 0.0 standard drinks  . Drug use: No  . Sexual activity: Not Currently  Other Topics Concern  . Not on file  Social History Narrative  . Not on file   Social Determinants of Health   Financial Resource Strain:   . Difficulty of Paying Living Expenses:   Food Insecurity:   . Worried About Charity fundraiser in the Last Year:   . Arboriculturist in the Last  Year:   Transportation Needs:   . Film/video editor (Medical):   Marland Kitchen Lack of Transportation (Non-Medical):   Physical Activity:   . Days of Exercise per Week:   . Minutes of Exercise per Session:   Stress:   . Feeling of Stress :   Social Connections:   . Frequency of Communication with Friends and Family:   . Frequency of Social Gatherings with Friends and Family:   . Attends Religious Services:   . Active Member of Clubs or Organizations:   . Attends Archivist Meetings:   Marland Kitchen Marital Status:   Intimate Partner Violence:   . Fear of Current or Ex-Partner:   . Emotionally Abused:   Marland Kitchen Physically Abused:   . Sexually Abused:     Allergies: Allergies  Allergen Reactions  . Sulfa Antibiotics Hives    Current Medications: Current Outpatient Medications  Medication Sig Dispense Refill  . acetaminophen (TYLENOL) 500 MG tablet Take 500 mg by mouth every 6 (six) hours as needed (for pain.).    Marland Kitchen  apixaban (ELIQUIS) 5 MG TABS tablet Take 1 tablet (5 mg total) by mouth 2 (two) times daily. (Patient taking differently: Take 2.5 mg by mouth 2 (two) times daily. ) 60 tablet 3  . Blood Glucose Monitoring Suppl (FREESTYLE LITE) DEVI     . dapagliflozin propanediol (FARXIGA) 10 MG TABS tablet Take 10 mg by mouth daily. (Patient taking differently: Take 10 mg by mouth daily at 6 PM. ) 90 tablet 3  . ferrous sulfate 325 (65 FE) MG tablet Take 325 mg by mouth 3 (three) times a week.     Marland Kitchen FREESTYLE LITE test strip     . FreeStyle Unistick II Lancets MISC Use as instructed to check blood glucose daily 100 each 5  . glucose blood (FREESTYLE TEST STRIPS) test strip Use as instructed to check blood glucose daily 100 each 12  . glucose blood test strip     . glucose monitoring kit (FREESTYLE) monitoring kit 1 each by Does not apply route as needed for other. 1 each 0  . Lancets (FREESTYLE) lancets     . lisinopril (ZESTRIL) 5 MG tablet Take 1 tablet (5 mg total) by mouth daily. (Patient  taking differently: Take 5 mg by mouth daily with supper. ) 90 tablet 3  . loratadine (CLARITIN) 10 MG tablet Take 1 tablet (10 mg total) by mouth daily. 90 tablet 3  . metFORMIN (GLUCOPHAGE) 1000 MG tablet Take 1 tablet (1,000 mg total) by mouth 2 (two) times daily with a meal. 180 tablet 3  . Multiple Vitamin (MULTIVITAMIN WITH MINERALS) TABS tablet Take 1 tablet by mouth 2 (two) times a week.    Marland Kitchen omeprazole (PRILOSEC) 20 MG capsule TAKE 1 CAPSULE (20 MG TOTAL) BY MOUTH DAILY. 90 capsule 3  . rosuvastatin (CRESTOR) 5 MG tablet Take 1 tablet (5 mg total) by mouth daily. (Patient taking differently: Take 5 mg by mouth every evening. ) 90 tablet 3  . vitamin B-12 (CYANOCOBALAMIN) 1000 MCG tablet Take 1 tablet (1,000 mcg total) by mouth daily. 90 tablet 1  . traMADol (ULTRAM) 50 MG tablet Take 1 tablet (50 mg total) by mouth every 6 (six) hours as needed. (Patient not taking: Reported on 11/22/2019) 10 tablet 1   No current facility-administered medications for this visit.    Review of Systems  General: negative for fevers, changes in weight or night sweats Skin: negative for changes in moles or sores or rash Eyes: negative for changes in vision HEENT: negative for change in hearing, tinnitus, voice changes Pulmonary: negative for dyspnea, orthopnea, productive cough, wheezing Cardiac: negative for palpitations, pain Gastrointestinal: negative for nausea, vomiting, constipation, diarrhea, hematemesis, hematochezia Genitourinary/Sexual: negative for dysuria, retention, hematuria, incontinence Ob/Gyn:  abnormal bleeding o/w negative for pain Musculoskeletal: negative for pain, joint pain, back pain Hematology: negative for easy bruising, abnormal bleeding Neurologic/Psych: negative for headaches, seizures, paralysis, weakness, numbness  Objective:  Physical Examination:  BP 123/69 (BP Location: Right Arm, Patient Position: Sitting)   Pulse 94   Temp 98.7 F (37.1 C) (Tympanic)   Resp 18    Wt 232 lb 4.8 oz (105.4 kg)   BMI 39.87 kg/m    ECOG Performance Status: 0 - Asymptomatic  GENERAL: Patient is a well appearing female in no acute distress HEENT:  PERRL, neck supple with midline trachea. Thyroid without masses.  NODES:  No cervical, supraclavicular, axillary, or inguinal lymphadenopathy palpated.  LUNGS:  Clear to auscultation bilaterally.   HEART:  Regular rate and rhythm. ABDOMEN:  Soft, nontender,  nondistended. No ascites/hernia/masses. Exam limited by habitus.  EXTREMITIES:  No peripheral edema.   SKIN:  Clear with no obvious rashes or skin changes.  NEURO:  Nonfocal. Well oriented.  Appropriate affect.   Pelvic: exam chaperoned by nurse;  Vulva: normal appearing vulva with no masses, tenderness or lesions; Vagina: normal vagina; Adnexa:  nontender and no masses but limited by habitus; Uterus: nontender unable to determine size due to habitus; Cervix: no lesions; Rectal: not indicated    Lab Review Labs pending  Lab Results  Component Value Date   WBC 4.1 10/31/2019   HGB 11.5 (L) 10/31/2019   HCT 35.1 (L) 10/31/2019   MCV 81.3 10/31/2019   PLT 115 (L) 10/31/2019   TSH=2.75 normal on 09/21/2019   Radiologic Imaging: As per HPI    Assessment:  Cindy Trujillo is a 64 y.o. female diagnosed with grade 1 endometrioid  endometrial cancer s/p D&C with Myosure resection.   Medical co-morbidities complicating care: DVT on anti-coagulation, GERD, diabetes, prior abdominal surgery and immunocompromised. Body mass index is 39.87 kg/m.  Plan:   Problem List Items Addressed This Visit    None    Visit Diagnoses    Thrombocytopenia ([place])    -  Primary   Relevant Orders   CBC with Differential/Platelet   Comprehensive metabolic panel   Hemoglobin A1c   Endometrial cancer ([place])       Relevant Orders   CBC with Differential/Platelet   Comprehensive metabolic panel   Hemoglobin A1c   History of DVT (deep vein thrombosis)          We discussed  options for management including   A long discussion was held with the patient today about her endometrioid carcinoma of the endometrium.  We recommend that she undergo surgical treatment with hysterectomy and bilateral salpingoophorectomy via minimally invasive laparoscopic/robotic surgery.  We discussed the risks and benefits of surgical staging with pelvic washings, pelvic and para-aortic lymph node sampling and the use of sentinel node biopsy. We reviewed alternative options such as radiation and hormonal therapy but cure rates are lower with both those approaches. She has opted for surgery.   The risks of surgery were discussed in detail and she understands these to include infection; wound separation; hernia; vaginal cuff separation, injury to adjacent organs such as bowel, bladder, blood vessels, ureters and nerves; bleeding which may require blood transfusion; anesthesia risk; thromboembolic events; possible death; unforeseen complications; possible need for re-exploration; medical complications such as heart attack, stroke, pleural effusion and pneumonia; and, if staging performed the risk of lymphedema and lymphocyst.  The patient will receive DVT and antibiotic prophylaxis and treatment as indicated.  She voiced a clear understanding.  She had the opportunity to ask questions and written informed consent was obtained today.  Check labs CBC, CMP, and HbA1c.  Suggested return to clinic in  4-6 weeks postop.    Gyn VTE Prophylaxis Algorithm  Risk factors for VTE (calculator):  Point value Risk factors  1 point each none in this category   2 points each age 52-74 BMI >30  3 points each personal/family history of VTE  5 points each  none in this category   Major surgery (>30 min); known malignancy or concern for malignancy (elevated tumor markers); minimally invasive surgery.  Risk assessment: 5+ points; Very high risk - heparin/UFH and SCDs and prophylactic dosing lovenox for 2 days  (admission for 2 nights) and then restart on therapeutic Eliquis on extended anticoagulation for 28 days.  Further Eliquis management will be based on Dr. Collie Siad assessment of thrombophilia. Eliquis to be stopped at least 2 days before surgery.   The patient's diagnosis, an outline of the further diagnostic and laboratory studies which will be required, the recommendation, and alternatives were discussed.  All questions were answered to the patient's satisfaction.  A total of over 80 minutes were spent with the patient/family today; >50% was spent in education, counseling and coordination of care for endometrial cancer.    Carinne Brandenburger Gaetana Michaelis, MD    CC:  Dr. Adrian Prows

## 2019-11-22 NOTE — Patient Instructions (Signed)
We will schedule your surgery for 12/06/19. We will arrange a pre-admit testing appointment and a COVID test for you. The COVID testing site is at the [facility]. Please come on 12/04/19 between the hours of 8:00 am and 1:00 pm. You do not need to get out of your car. I will call you with your pre-admit testing appointment once it has been scheduled. We will see you 4-6 weeks after surgery.           DIVISION OF GYNECOLOGIC ONCOLOGY BOWEL PREP   The following instructions are extremely important to prepare for your surgery. Please follow them carefully   Step 1: Liquid Diet Instructions              Clear Liquid Diet for GYN Oncology Patients Day Before Surgery The day before your scheduled surgery DO NOT EAT any solid foods.  We do want you to drink enough liquids, but NO MILK products.  We do not want you to be dehydrated.  Clear liquids are defined as no milk products and no pieces of any solid food. Drink at least 64 oz. of fluid.  The following are all approved for you to drink the day before you surgery.  Chicken, Beef or Vegetable Broth (bouillon or consomm) - NO BROTH AFTER MIDNIGHT  Plain Jello  (no fruit)  Water  Strained lemonade or fruit punch  Gatorade (any flavor)  CLEAR Ensure or Boost Breeze  Fruit juices without pulp, such as apple, grape, or cranberry juice  Clear sodas - NO SODA AFTER MIDNIGHT  Ice Pops without bits of fruit or fruit pulp  Honey  Tea or coffee without milk or cream                 Any foods not on the above list should be avoided                                                                                             Step 2: Laxatives           The evening before surgery:   Time: around 5pm   Follow these instructions carefully.   Administer 1 Dulcolax suppository according to manufacturer instructions on the box. You will need to purchase this laxative at a pharmacy or grocery store.    Individual responses to  laxatives vary; this prep may cause multiple bowel movements. It often works in 30 minutes and may take as long as 3 hours. Stay near an available bathroom.    It is important to stay hydrated. Ensure you are still drinking clear liquids.     IMPORTANT: FOR YOUR SAFETY, WE WILL HAVE TO CANCEL YOUR SURGERY IF YOU DO NOT FOLLOW THESE INSTRUCTIONS.    Do not eat anything after midnight (including gum or candy) prior to your surgery.  Avoid drinking carbonated beverages after midnight.  You can have clear liquids up until one hour before you arrive at the hospital. "Nothing by mouth" means no liquids, gum, candy, etc for one hour before your arrival time.      Laparoscopy Laparoscopy is a procedure  to diagnose diseases in the abdomen. During the procedure, a thin, lighted, pencil-sized instrument called a laparoscope is inserted into the abdomen through an incision. The laparoscope allows your health care provider to look at the organs inside your body. LET [facility] CARE PROVIDER KNOW ABOUT:  Any allergies you have.  All medicines you are taking, including vitamins, herbs, eye drops, creams, and over-the-counter medicines.  Previous problems you or members of your family have had with the use of anesthetics.  Any blood disorders you have.  Previous surgeries you have had.  Medical conditions you have. RISKS AND COMPLICATIONS  Generally, this is a safe procedure. However, problems can occur, which may include:  Infection.  Bleeding.  Damage to other organs.  Allergic reaction to the anesthetics used during the procedure. BEFORE THE PROCEDURE  Do not eat or drink anything after midnight on the night before the procedure or as directed by your health care provider.  Ask your health care provider about: ? Changing or stopping your regular medicines. ? Taking medicines such as aspirin and ibuprofen. These medicines can thin your blood. Do not take these medicines before your  procedure if your health care provider instructs you not to.  Plan to have someone take you home after the procedure. PROCEDURE  You may be given a medicine to help you relax (sedative).  You will be given a medicine to make you sleep (general anesthetic).  Your abdomen will be inflated with a gas. This will make your organs easier to see.  Small incisions will be made in your abdomen.  A laparoscope and other small instruments will be inserted into the abdomen through the incisions.  A tissue sample may be removed from an organ in the abdomen for examination.  The instruments will be removed from the abdomen.  The gas will be released.  The incisions will be closed with stitches (sutures). AFTER THE PROCEDURE  Your blood pressure, heart rate, breathing rate, and blood oxygen level will be monitored often until the medicines you were given have worn off.   This information is not intended to replace advice given to you by your health care provider. Make sure you discuss any questions you have with your health care provider.                                             Bowel Symptoms After Surgery After gynecologic surgery, women often have temporary changes in bowel function (constipation and gas pain).  Following are tips to help prevent and treat common bowel problems.  It also tells you when to call the doctor.  This is important because some symptoms might be a sign of a more serious bowel problem such as obstruction (bowel blockage).  These problems are rare but can happen after gynecologic surgery.   Besides surgery, what can temporarily affect bowel function? 1. Dietary changes   2. Decreased physical activity   3.Antibiotics   4. Pain medication   How can I prevent constipation (three days or more without a stool)? 1. Include fiber in your diet: whole grains, raw or dried fruits & vegetables, prunes, prune/pear juiceDrink at least 8 glasses of liquid (preferably water)  every day 2. Avoid: ? Gas forming foods such as broccoli, beans, peas, salads, cabbage, sweet potatoes ? Greasy, fatty, or fried foods 3. Activity helps bowel function return to  normal, walk around the house at least 3-4 times each day for 15 minutes or longer, if tolerated.  Rocking in a rocking chair is preferable to sitting still. 4. Stool softeners: these are not laxatives, but serve to soften the stool to avoid straining.  Take 2-4 times a day until normal bowel function returns         Examples: Colace or generic equivalent (Docusate) 5. Bulk laxatives: provide a concentrated source of fiber.  They do not stimulate the bowel.  Take 1-2 times each day until normal bowel function return.              Examples: Citrucel, Metamucil, Fiberal, Fibercon   What can I take for "Gas Pains"? 1. Simethicone (Mylicon, Gas-X, Maalox-Gas, Mylanta-Gas) take 3-4 times a day 2. Maalox Regular - take 3-4 times a day 3. Mylanta Regular - take 3-4 times a day   What can I take if I become constipated? 1. Start with stool softeners and add additional laxatives below as needed to have a bowel movement every 1-2 days  2. Stool softeners 1-2 tablets, 2 times a day 3. Senokot 1-2 tablets, 1-2 times a day 4. Glycerin suppository can soften hard stool take once a day 5. Bisacodyl suppository once a day  6. Milk of Magnesia 30 mL 1-2 times a day 7. Fleets or tap water enema    What can I do for nausea?  1. Limit most solid foods for 24-48 hours 2. Continue eating small frequent amounts of liquids and/or bland soft foods ? Toast, crackers, cooked cereal (grits, cream of wheat, rice) 3. Benadryl: a mild anti-nausea medicine can be obtained without a prescription. May cause drowsiness, especially if taken with narcotic pain medicines 4. Contact provider for prescription nausea medication     What can I do, or take for diarrhea (more than five loose stools per day)? 1. Drink plenty of clear fluids to prevent  dehydration 2. May take Kaopectate, Pepto-Bismol, Imodium, or probiotics for 1-2 days 3. Anusol or Preparation-H can be helpful for hemorrhoids and irritated tissue around anus   When should I call the doctor?             CONSTIPATION:   Not relieved after three days following the above program VOMITING:  That contains blood, "coffee ground" material  More the three times/hour and unable to keep down nausea medication for more than eight hours  With dry mouth, dark or strong urine, feeling light-headed, dizzy, or confused  With severe abdominal pain or bloating for more than 24 hours DIARRHEA:  That continues for more then 24-48 hours despite treatment  That contains blood or tarry material  With dry mouth, dark or strong urine, feeling light~headed, dizzy, or confused FEVER:  101 F or higher along with nausea, vomiting, gas pain, diarrhea UNABLE TO:  Pass gas from rectum for more than 24 hours  Tolerate liquids by mouth for more than 24 hours        Laparoscopic Hysterectomy, Care After Refer to this sheet in the next few weeks. These instructions provide you with information on caring for yourself after your procedure. Your health care provider may also give you more specific instructions. Your treatment has been planned according to current medical practices, but problems sometimes occur. Call your health care provider if you have any problems or questions after your procedure. What can I expect after the procedure?  Pain and bruising at the incision sites. You will be given pain medicine  to control it.  Menopausal symptoms such as hot flashes, night sweats, and insomnia if your ovaries were removed.  Sore throat from the breathing tube that was inserted during surgery. Follow these instructions at home:  Only take over-the-counter or prescription medicines for pain, discomfort, or fever as directed by your health care provider.  Do not take aspirin. It can cause  bleeding.  Do not drive when taking pain medicine.  Follow your health care provider's advice regarding diet, exercise, lifting, driving, and general activities.  Resume your usual diet as directed and allowed.  Get plenty of rest and sleep.  Do not douche, use tampons, or have sexual intercourse for at least 6 weeks, or until your health care provider gives you permission.  Change your bandages (dressings) as directed by your health care provider.  Monitor your temperature and notify your health care provider of a fever.  Take showers instead of baths for 2-3 weeks.  Do not drink alcohol until your health care provider gives you permission.  If you develop constipation, you may take a mild laxative with your health care provider's permission. Bran foods may help with constipation problems. Drinking enough fluids to keep your urine clear or pale yellow may help as well.  Try to have someone home with you for 1-2 weeks to help around the house.  Keep all of your follow-up appointments as directed by your health care provider. Contact a health care provider if:  You have swelling, redness, or increasing pain around your incision sites.  You have pus coming from your incision.  You notice a bad smell coming from your incision.  Your incision breaks open.  You feel dizzy or lightheaded.  You have pain or bleeding when you urinate.  You have persistent diarrhea.  You have persistent nausea and vomiting.  You have abnormal vaginal discharge.  You have a rash.  You have any type of abnormal reaction or develop an allergy to your medicine.  You have poor pain control with your prescribed medicine. Get help right away if:  You have chest pain or shortness of breath.  You have severe abdominal pain that is not relieved with pain medicine.  You have pain or swelling in your legs. This information is not intended to replace advice given to you by your health care  provider. Make sure you discuss any questions you have with your health care provider. Document Released: 01/18/2013 Document Revised: 09/05/2015 Document Reviewed: 10/18/2012 Elsevier Interactive Patient Education  2017 [employer].

## 2019-11-22 NOTE — Progress Notes (Signed)
Surgery being scheduled for 12/06/19 with Dr. Gilman Schmidt and Dr. Theora Gianotti. Will notify with pre-admit testing and COVID testing once arranged. Pre and post operative teaching completed. Copy of teaching provided in AVS. Arranging appointment with Dr. Tasia Catchings for thrombophilia work up.

## 2019-11-22 NOTE — Telephone Encounter (Signed)
follow up with Dr. Tasia Catchings for a thrombophilia workup per [facility] 11/22/19 scheduling message. A detailed message was left on pts vmail making her aware of the sched date and time of her 11/24/19 appt.

## 2019-11-22 NOTE — Progress Notes (Signed)
Gynecologic Oncology Consult Visit   Referring Provider: Adrian Prows, MD  Chief Concern: endometrial cancer  Subjective:  Cindy Trujillo is a 64 y.o. female who is seen in consultation from Dr. Gilman Schmidt for endometrial cancer who presented initially with complaints of postmenopausal bleeding.   She reported PMB which has been occurring on and off since June of 2021.  Evaluation included the following:   Pelvic US 09/21/2019 FINDINGS: Uterus: measurements: 8.6 x 3.2 x 4.9 cm = volume: 71.8 mL. Uterus is anteverted. No fibroids or other mass. Endometrium: Focal endometrial lesion/thickening measuring 1.1 x 0.9 x 1.4 cm seen within the central aspect of the endometrial complex. Associated scattered areas of internal vascularity. Elsewhere, the endometrial stripe is thin measuring no more than 2 mm.  Right ovary: Not visualized.  No adnexal mass. Left ovary: Measurements: 1.9 x 1.5 x 1.9 cm = volume: 1.7 mL. Normal appearance/no adnexal mass. IMPRESSION: 1. 1.1 x 0.9 x 1.4 cm focal endometrial lesion/thickening as above. Finding is nonspecific, with primary differential considerations including an endometrial polyp, focal endometrial hyperplasia, or possibly endometrial carcinoma. In the setting of post-menopausal bleeding, endometrial sampling is indicated to exclude carcinoma. If results are benign, sonohysterogram should be considered for focal lesion work-up prior to hysteroscopy. (Ref: Radiological Reasoning: Algorithmic Workup of Abnormal Vaginal Bleeding with Endovaginal Sonography and Sonohysterography. AJR 2008; 161:W96-04). 2. Otherwise normal sonographic appearance of the uterus. 3. Normal left ovary, with nonvisualization of the right ovary. No adnexal mass or free fluid.  Pap 09/21/2019 NILM; HRHPV negative  10/31/2019 D&C with Myosure Uterus sounded to 9 cm.  Hysteroscopy revealed polypoid tissue growing from the right cornua.  Pathology A. ENDOMETRIUM, CURETTAGE:  -  ENDOMETRIAL ADENOCARCINOMA, ENDOMETRIOID TYPE, FIGO GRADE 1.  - BACKGROUND EIN / ATYPICAL HYPERPLASIA.  Of note she had a DVT in May of 2020. Currently on Eliquis. DVT was found incidentally to her procedure for varicose veins.   She also has h/o low platelets and follows up with hematology/oncology for a bone marrow biopsy.   She presents today for evaluation.   Problem List: Patient Active Problem List   Diagnosis Date Noted   Post-menopausal bleeding 09/21/2019   Cervical cancer screening 09/21/2019   Annual physical exam 09/21/2019   DVT (deep venous thrombosis) ([place]) 11/29/2018   Acute deep vein thrombosis (DVT) of femoral vein of right lower extremity ([place]) 10/12/2018   Microalbuminuria due to type 2 diabetes mellitus ([place]) 06/23/2018   Varicose veins of leg with pain, bilateral 05/20/2018   Varicose veins of both lower extremities 08/27/2017   T2DM (type 2 diabetes mellitus) ([place])    Hyperlipidemia associated with type 2 diabetes mellitus ([place])    Allergic rhinitis 12/11/2016   GERD (gastroesophageal reflux disease) 12/11/2016   Morbid obesity ([place]) 12/11/2016    Past Medical History: Past Medical History:  Diagnosis Date   Diabetes mellitus without complication ([place])    DVT (deep venous thrombosis) ([place]) 08/2018   right leg   GERD (gastroesophageal reflux disease)    Hyperlipidemia     Past Surgical History: Past Surgical History:  Procedure Laterality Date   COLONOSCOPY WITH PROPOFOL N/A 08/23/2017   Procedure: COLONOSCOPY WITH PROPOFOL;  Surgeon: Manya Silvas, MD;  Location: [facility] ENDOSCOPY;  Service: Endoscopy;  Laterality: N/A;   HYSTEROSCOPY WITH D & C N/A 10/31/2019   Procedure: DILATATION AND CURETTAGE /HYSTEROSCOPY WITH MYOSURE RESECTION;  Surgeon: Homero Fellers, MD;  Location: [facility] ORS;  Service: Gynecology;  Laterality: N/A;   TONSILLECTOMY  [place] [facility]  05/2019   laser     Past Gynecologic History:  Gynecological History Menarche: 89 Menopause: 62 Describes periods as regular, monthly Last pap smear: 09/21/2019- NIL Last Mammogram: None, patient only has clinical exams- declines mammogram  Hx OCP usage for for 15-18 years Hx obesity for 20 years   OB History:  G0P0 OB History  Gravida Para Term Preterm AB Living  0 0 0 0 0 0  SAB TAB Ectopic Multiple Live Births  0 0 0 0 0    Family History: Family History  Problem Relation Age of Onset   COPD Mother        heavy smoker   Pulmonary fibrosis Mother    Fibromyalgia Mother    COPD Father    Melanoma Father    COPD Sister    COPD Brother    COPD Sister    COPD Sister    Healthy Brother    Heart failure Maternal Grandmother    Heart attack Maternal Grandfather    Cancer Paternal Grandmother        unsure of type, had mets in colon   Heart attack Paternal Grandfather    Cancer Paternal Grandfather        unknown   Breast cancer Neg Hx    Cervical cancer Neg Hx     Social History: Retired worked a a Production assistant, radio Social History   Socioeconomic History   Marital status: Married    Spouse name: Futures trader of children: 0   Years of education: some college   Highest education level: Not on file  Occupational History   Occupation: CARE MANGEMENT    Employer: [employer]  Tobacco Use   Smoking status: Never Smoker   Smokeless tobacco: Never Used  Scientific laboratory technician Use: Never used  Substance and Sexual Activity   Alcohol use: No    Alcohol/week: 0.0 standard drinks   Drug use: No   Sexual activity: Not Currently  Other Topics Concern   Not on file  Social History Narrative   Not on file   Social Determinants of Health   Financial Resource Strain:    Difficulty of Paying Living Expenses:   Food Insecurity:    Worried About Charity fundraiser in the Last Year:    Arboriculturist in the Last  Year:   Transportation Needs:    Film/video editor (Medical):    Lack of Transportation (Non-Medical):   Physical Activity:    Days of Exercise per Week:    Minutes of Exercise per Session:   Stress:    Feeling of Stress :   Social Connections:    Frequency of Communication with Friends and Family:    Frequency of Social Gatherings with Friends and Family:    Attends Religious Services:    Active Member of Clubs or Organizations:    Attends Archivist Meetings:    Marital Status:   Intimate Partner Violence:    Fear of Current or Ex-Partner:    Emotionally Abused:    Physically Abused:    Sexually Abused:     Allergies: Allergies  Allergen Reactions   Sulfa Antibiotics Hives    Current Medications: Current Outpatient Medications  Medication Sig Dispense Refill   acetaminophen (TYLENOL) 500 MG tablet Take 500 mg by mouth every 6 (six) hours as needed (for pain.).  apixaban (ELIQUIS) 5 MG TABS tablet Take 1 tablet (5 mg total) by mouth 2 (two) times daily. (Patient taking differently: Take 2.5 mg by mouth 2 (two) times daily. ) 60 tablet 3   Blood Glucose Monitoring Suppl (FREESTYLE LITE) DEVI      dapagliflozin propanediol (FARXIGA) 10 MG TABS tablet Take 10 mg by mouth daily. (Patient taking differently: Take 10 mg by mouth daily at 6 PM. ) 90 tablet 3   ferrous sulfate 325 (65 FE) MG tablet Take 325 mg by mouth 3 (three) times a week.      FREESTYLE LITE test strip      FreeStyle Unistick II Lancets MISC Use as instructed to check blood glucose daily 100 each 5   glucose blood (FREESTYLE TEST STRIPS) test strip Use as instructed to check blood glucose daily 100 each 12   glucose blood test strip      glucose monitoring kit (FREESTYLE) monitoring kit 1 each by Does not apply route as needed for other. 1 each 0   Lancets (FREESTYLE) lancets      lisinopril (ZESTRIL) 5 MG tablet Take 1 tablet (5 mg total) by mouth daily. (Patient  taking differently: Take 5 mg by mouth daily with supper. ) 90 tablet 3   loratadine (CLARITIN) 10 MG tablet Take 1 tablet (10 mg total) by mouth daily. 90 tablet 3   metFORMIN (GLUCOPHAGE) 1000 MG tablet Take 1 tablet (1,000 mg total) by mouth 2 (two) times daily with a meal. 180 tablet 3   Multiple Vitamin (MULTIVITAMIN WITH MINERALS) TABS tablet Take 1 tablet by mouth 2 (two) times a week.     omeprazole (PRILOSEC) 20 MG capsule TAKE 1 CAPSULE (20 MG TOTAL) BY MOUTH DAILY. 90 capsule 3   rosuvastatin (CRESTOR) 5 MG tablet Take 1 tablet (5 mg total) by mouth daily. (Patient taking differently: Take 5 mg by mouth every evening. ) 90 tablet 3   vitamin B-12 (CYANOCOBALAMIN) 1000 MCG tablet Take 1 tablet (1,000 mcg total) by mouth daily. 90 tablet 1   traMADol (ULTRAM) 50 MG tablet Take 1 tablet (50 mg total) by mouth every 6 (six) hours as needed. (Patient not taking: Reported on 11/22/2019) 10 tablet 1   No current facility-administered medications for this visit.    Review of Systems  General: negative for fevers, changes in weight or night sweats Skin: negative for changes in moles or sores or rash Eyes: negative for changes in vision HEENT: negative for change in hearing, tinnitus, voice changes Pulmonary: negative for dyspnea, orthopnea, productive cough, wheezing Cardiac: negative for palpitations, pain Gastrointestinal: negative for nausea, vomiting, constipation, diarrhea, hematemesis, hematochezia Genitourinary/Sexual: negative for dysuria, retention, hematuria, incontinence Ob/Gyn:  abnormal bleeding o/w negative for pain Musculoskeletal: negative for pain, joint pain, back pain Hematology: negative for easy bruising, abnormal bleeding Neurologic/Psych: negative for headaches, seizures, paralysis, weakness, numbness  Objective:  Physical Examination:  BP 123/69 (BP Location: Right Arm, Patient Position: Sitting)    Pulse 94    Temp 98.7 F (37.1 C) (Tympanic)    Resp 18     Wt 232 lb 4.8 oz (105.4 kg)    BMI 39.87 kg/m    ECOG Performance Status: 0 - Asymptomatic  GENERAL: Patient is a well appearing female in no acute distress HEENT:  PERRL, neck supple with midline trachea. Thyroid without masses.  NODES:  No cervical, supraclavicular, axillary, or inguinal lymphadenopathy palpated.  LUNGS:  Clear to auscultation bilaterally.   HEART:  Regular rate and  rhythm. ABDOMEN:  Soft, nontender, nondistended. No ascites/hernia/masses. Exam limited by habitus.  EXTREMITIES:  No peripheral edema.   SKIN:  Clear with no obvious rashes or skin changes.  NEURO:  Nonfocal. Well oriented.  Appropriate affect.   Pelvic: exam chaperoned by nurse;  Vulva: normal appearing vulva with no masses, tenderness or lesions; Vagina: normal vagina; Adnexa:  nontender and no masses but limited by habitus; Uterus: nontender unable to determine size due to habitus; Cervix: no lesions; Rectal: not indicated    Lab Review Labs pending  Lab Results  Component Value Date   WBC 4.1 10/31/2019   HGB 11.5 (L) 10/31/2019   HCT 35.1 (L) 10/31/2019   MCV 81.3 10/31/2019   PLT 115 (L) 10/31/2019   TSH=2.75 normal on 09/21/2019   Radiologic Imaging: As per HPI    Assessment:  Cindy Trujillo is a 64 y.o. female diagnosed with grade 1 endometrioid  endometrial cancer s/p D&C with Myosure resection.   Medical co-morbidities complicating care: DVT on anti-coagulation, GERD, diabetes, prior abdominal surgery and immunocompromised. Body mass index is 39.87 kg/m.  Plan:   Problem List Items Addressed This Visit    None    Visit Diagnoses    Thrombocytopenia ([place])    -  Primary   Relevant Orders   CBC with Differential/Platelet   Comprehensive metabolic panel   Hemoglobin A1c   Endometrial cancer ([place])       Relevant Orders   CBC with Differential/Platelet   Comprehensive metabolic panel   Hemoglobin A1c   History of DVT (deep vein thrombosis)          We discussed  options for management including   A long discussion was held with the patient today about her endometrioid carcinoma of the endometrium.  We recommend that she undergo surgical treatment with hysterectomy and bilateral salpingoophorectomy via minimally invasive laparoscopic/robotic surgery.  We discussed the risks and benefits of surgical staging with pelvic washings, pelvic and para-aortic lymph node sampling and the use of sentinel node biopsy. We reviewed alternative options such as radiation and hormonal therapy but cure rates are lower with both those approaches. She has opted for surgery.   The risks of surgery were discussed in detail and she understands these to include infection; wound separation; hernia; vaginal cuff separation, injury to adjacent organs such as bowel, bladder, blood vessels, ureters and nerves; bleeding which may require blood transfusion; anesthesia risk; thromboembolic events; possible death; unforeseen complications; possible need for re-exploration; medical complications such as heart attack, stroke, pleural effusion and pneumonia; and, if staging performed the risk of lymphedema and lymphocyst.  The patient will receive DVT and antibiotic prophylaxis and treatment as indicated.  She voiced a clear understanding.  She had the opportunity to ask questions and written informed consent was obtained today.  Check labs CBC, CMP, and HbA1c.  Suggested return to clinic in  4-6 weeks postop.    Gyn VTE Prophylaxis Algorithm  Risk factors for VTE (calculator):  Point value Risk factors  1 point each none in this category   2 points each age 26-74 BMI >30  3 points each personal/family history of VTE  5 points each  none in this category   Major surgery (>30 min); known malignancy or concern for malignancy (elevated tumor markers); minimally invasive surgery.  Risk assessment: 5+ points; Very high risk - heparin/UFH and SCDs and prophylactic dosing lovenox for 2 days  (admission for 2 nights) and then restart on therapeutic Eliquis on  extended anticoagulation for 28 days. Further Eliquis management will be based on Dr. Collie Siad assessment of thrombophilia. Stop Eliquis at least 2 days before surgery.   The patient's diagnosis, an outline of the further diagnostic and laboratory studies which will be required, the recommendation, and alternatives were discussed.  All questions were answered to the patient's satisfaction.  A total of over 80 minutes were spent with the patient/family today; >50% was spent in education, counseling and coordination of care for endometrial cancer.    Braxtyn Bojarski Gaetana Michaelis, MD    CC:  Dr. Adrian Prows

## 2019-11-23 ENCOUNTER — Inpatient Hospital Stay: Payer: 59

## 2019-11-23 DIAGNOSIS — Z7901 Long term (current) use of anticoagulants: Secondary | ICD-10-CM | POA: Diagnosis not present

## 2019-11-23 DIAGNOSIS — Z7984 Long term (current) use of oral hypoglycemic drugs: Secondary | ICD-10-CM | POA: Diagnosis not present

## 2019-11-23 DIAGNOSIS — Z86718 Personal history of other venous thrombosis and embolism: Secondary | ICD-10-CM | POA: Diagnosis not present

## 2019-11-23 DIAGNOSIS — K219 Gastro-esophageal reflux disease without esophagitis: Secondary | ICD-10-CM | POA: Diagnosis not present

## 2019-11-23 DIAGNOSIS — D696 Thrombocytopenia, unspecified: Secondary | ICD-10-CM

## 2019-11-23 DIAGNOSIS — C541 Malignant neoplasm of endometrium: Secondary | ICD-10-CM

## 2019-11-23 DIAGNOSIS — Z79899 Other long term (current) drug therapy: Secondary | ICD-10-CM | POA: Diagnosis not present

## 2019-11-23 DIAGNOSIS — Z6839 Body mass index (BMI) 39.0-39.9, adult: Secondary | ICD-10-CM | POA: Diagnosis not present

## 2019-11-23 DIAGNOSIS — E785 Hyperlipidemia, unspecified: Secondary | ICD-10-CM | POA: Diagnosis not present

## 2019-11-23 DIAGNOSIS — E119 Type 2 diabetes mellitus without complications: Secondary | ICD-10-CM | POA: Diagnosis not present

## 2019-11-23 LAB — CBC WITH DIFFERENTIAL/PLATELET
Abs Immature Granulocytes: 0 10*3/uL (ref 0.00–0.07)
Basophils Absolute: 0 10*3/uL (ref 0.0–0.1)
Basophils Relative: 0 %
Eosinophils Absolute: 0.1 10*3/uL (ref 0.0–0.5)
Eosinophils Relative: 3 %
HCT: 34.4 % — ABNORMAL LOW (ref 36.0–46.0)
Hemoglobin: 11.4 g/dL — ABNORMAL LOW (ref 12.0–15.0)
Immature Granulocytes: 0 %
Lymphocytes Relative: 35 %
Lymphs Abs: 1.5 10*3/uL (ref 0.7–4.0)
MCH: 26.8 pg (ref 26.0–34.0)
MCHC: 33.1 g/dL (ref 30.0–36.0)
MCV: 80.8 fL (ref 80.0–100.0)
Monocytes Absolute: 0.3 10*3/uL (ref 0.1–1.0)
Monocytes Relative: 7 %
Neutro Abs: 2.3 10*3/uL (ref 1.7–7.7)
Neutrophils Relative %: 55 %
Platelets: 92 10*3/uL — ABNORMAL LOW (ref 150–400)
RBC: 4.26 MIL/uL (ref 3.87–5.11)
RDW: 14.6 % (ref 11.5–15.5)
WBC: 4.2 10*3/uL (ref 4.0–10.5)
nRBC: 0 % (ref 0.0–0.2)

## 2019-11-23 LAB — HEMOGLOBIN A1C
Hgb A1c MFr Bld: 7.7 % — ABNORMAL HIGH (ref 4.8–5.6)
Mean Plasma Glucose: 174.29 mg/dL

## 2019-11-23 LAB — COMPREHENSIVE METABOLIC PANEL
ALT: 26 U/L (ref 0–44)
AST: 42 U/L — ABNORMAL HIGH (ref 15–41)
Albumin: 3.9 g/dL (ref 3.5–5.0)
Alkaline Phosphatase: 88 U/L (ref 38–126)
Anion gap: 10 (ref 5–15)
BUN: 15 mg/dL (ref 8–23)
CO2: 20 mmol/L — ABNORMAL LOW (ref 22–32)
Calcium: 9.2 mg/dL (ref 8.9–10.3)
Chloride: 109 mmol/L (ref 98–111)
Creatinine, Ser: 1.12 mg/dL — ABNORMAL HIGH (ref 0.44–1.00)
GFR calc Af Amer: >60 mL/min (ref >60)
GFR calc non Af Amer: 53 mL/min — ABNORMAL LOW (ref >60)
Glucose, Bld: 186 mg/dL — ABNORMAL HIGH (ref 70–99)
Potassium: 4.2 mmol/L (ref 3.5–5.1)
Sodium: 139 mmol/L (ref 135–145)
Total Bilirubin: 1.2 mg/dL (ref 0.3–1.2)
Total Protein: 7.3 g/dL (ref 6.5–8.1)

## 2019-11-24 ENCOUNTER — Inpatient Hospital Stay: Payer: 59 | Admitting: Oncology

## 2019-11-24 ENCOUNTER — Other Ambulatory Visit: Payer: Self-pay

## 2019-11-24 ENCOUNTER — Encounter: Payer: Self-pay | Admitting: Oncology

## 2019-11-24 VITALS — BP 112/75 | HR 75 | Temp 97.1°F | Resp 18 | Wt 231.9 lb

## 2019-11-24 DIAGNOSIS — R932 Abnormal findings on diagnostic imaging of liver and biliary tract: Secondary | ICD-10-CM

## 2019-11-24 DIAGNOSIS — E119 Type 2 diabetes mellitus without complications: Secondary | ICD-10-CM | POA: Diagnosis not present

## 2019-11-24 DIAGNOSIS — D696 Thrombocytopenia, unspecified: Secondary | ICD-10-CM | POA: Diagnosis not present

## 2019-11-24 DIAGNOSIS — E785 Hyperlipidemia, unspecified: Secondary | ICD-10-CM | POA: Diagnosis not present

## 2019-11-24 DIAGNOSIS — Z86718 Personal history of other venous thrombosis and embolism: Secondary | ICD-10-CM

## 2019-11-24 DIAGNOSIS — C541 Malignant neoplasm of endometrium: Secondary | ICD-10-CM

## 2019-11-24 DIAGNOSIS — K219 Gastro-esophageal reflux disease without esophagitis: Secondary | ICD-10-CM | POA: Diagnosis not present

## 2019-11-24 DIAGNOSIS — Z6839 Body mass index (BMI) 39.0-39.9, adult: Secondary | ICD-10-CM | POA: Diagnosis not present

## 2019-11-24 DIAGNOSIS — Z7901 Long term (current) use of anticoagulants: Secondary | ICD-10-CM | POA: Diagnosis not present

## 2019-11-24 NOTE — Progress Notes (Signed)
Pt here for follow up. No new concerns voiced.   

## 2019-11-24 NOTE — Progress Notes (Addendum)
Hematology/Oncology Consult note [facility] [phone] [phone]   Patient Care Team: Virginia Crews, MD as PCP - General (Family Medicine) Clent Jacks, RN as Oncology Nurse Navigator  REFERRING PROVIDER: Virginia Crews, MD  CHIEF COMPLAINTS/REASON FOR VISIT:  Evaluation of thrombocytopenia  HISTORY OF PRESENTING ILLNESS:   Cindy Trujillo is a  64 y.o.  female with PMH listed below was seen in consultation at the request of  Brita Romp Dionne Bucy, MD  for evaluation of,  Thrombophilia work-up.    Patient was previously seen by me for thrombocytopenia.  She was last seen by me on 03/21/2019.  She lost follow-up. Patient was recently evaluated by gynecology for postmenopausal bleeding. Patient has had pelvic ultrasound and also 10/31/2019 she underwent D&C with MyoSure. Pathology showed endometrial adenocarcinoma, endometrioid type, FIGO grade 1.  Background EIN/atypical hyperplasia. Patient was seen by Dr. Theora Gianotti and recommend surgical treatment with hysterectomy and bilateral salpingoophorectomy with pelvic washing, pelvic para-aortic lymph node sampling and sentinel lymph node biopsy. Dr. Theora Gianotti recommends the patient reestablish care with me for thrombophilia work-up.  Patient is at risk of developing thrombosis perioperatively given the type of procedure, history of DVT, malignancy.   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  She has been on Eliquis 2.5 mg twice daily.  She was advised by primary care provider to cut 5 mg tablets to half for 2.5 mg twice daily.   Managed by primary care provider.  DVT was found incidentally to her procedure for varicose vein.  For her chronic thrombocytopenia, her initial work-up was nonremarkable except borderline vitamin B12 level.  She was recommended to observe blood counts and if further worsening of thrombocytopenia, I recommend bone marrow biopsy.   She lost follow-up since December 2020. Ultrasound liver showed coarse hepatic echo Texture suggesting fatty liver disease.  Today patient reports feeling well.  No vaginal bleeding currently.  Easy bruising.  No other bleeding events.  Review of Systems  Constitutional: Negative for appetite change, chills, fatigue and fever.  HENT:   Negative for hearing loss and voice change.   Eyes: Negative for eye problems.  Respiratory: Negative for chest tightness and cough.   Cardiovascular: Negative for chest pain.  Gastrointestinal: Negative for abdominal distention, abdominal pain and blood in stool.  Endocrine: Negative for hot flashes.  Genitourinary: Negative for difficulty urinating and frequency.   Musculoskeletal: Negative for arthralgias.  Skin: Negative for itching and rash.  Neurological: Negative for extremity weakness.  Hematological: Negative for adenopathy.  Psychiatric/Behavioral: Negative for confusion.    MEDICAL HISTORY:  Past Medical History:  Diagnosis Date  . Diabetes mellitus without complication ([place])   . DVT (deep venous thrombosis) ([place]) 08/2018   right leg  . GERD (gastroesophageal reflux disease)   . Hyperlipidemia     SURGICAL HISTORY: Past Surgical History:  Procedure Laterality Date  . COLONOSCOPY WITH PROPOFOL N/A 08/23/2017   Procedure: COLONOSCOPY WITH PROPOFOL;  Surgeon: Manya Silvas, MD;  Location[facility] ENDOSCOPY;  Service: Endoscopy;  Laterality: N/A;  . HYSTEROSCOPY WITH D & C N/A 10/31/2019   Procedure: DILATATION AND CURETTAGE /HYSTEROSCOPY WITH MYOSURE RESECTION;  Surgeon: Homero Fellers, MD;  Location: [facility] ORS;  Service: Gynecology;  Laterality: N/A;  . TONSILLECTOMY    . TONSILLECTOMY AND ADENOIDECTOMY  1968  . VEIN SURGERY  05/2019   laser    SOCIAL HISTORY: Social History   Socioeconomic History  . Marital  status: Married    Spouse name: Francee Piccolo  . Number of children: 0  . Years of education: some college  . Highest  education level: Not on file  Occupational History  . Occupation: CARE MANGEMENT    Employer: [employer]  Tobacco Use  . Smoking status: Never Smoker  . Smokeless tobacco: Never Used  Vaping Use  . Vaping Use: Never used  Substance and Sexual Activity  . Alcohol use: No    Alcohol/week: 0.0 standard drinks  . Drug use: No  . Sexual activity: Not Currently  Other Topics Concern  . Not on file  Social History Narrative  . Not on file   Social Determinants of Health   Financial Resource Strain:   . Difficulty of Paying Living Expenses:   Food Insecurity:   . Worried About Charity fundraiser in the Last Year:   . Arboriculturist in the Last Year:   Transportation Needs:   . Film/video editor (Medical):   Marland Kitchen Lack of Transportation (Non-Medical):   Physical Activity:   . Days of Exercise per Week:   . Minutes of Exercise per Session:   Stress:   . Feeling of Stress :   Social Connections:   . Frequency of Communication with Friends and Family:   . Frequency of Social Gatherings with Friends and Family:   . Attends Religious Services:   . Active Member of Clubs or Organizations:   . Attends Archivist Meetings:   Marland Kitchen Marital Status:   Intimate Partner Violence:   . Fear of Current or Ex-Partner:   . Emotionally Abused:   Marland Kitchen Physically Abused:   . Sexually Abused:     FAMILY HISTORY: Family History  Problem Relation Age of Onset  . COPD Mother        heavy smoker  . Pulmonary fibrosis Mother   . Fibromyalgia Mother   . COPD Father   . Melanoma Father   . COPD Sister   . COPD Brother   . COPD Sister   . COPD Sister   . Healthy Brother   . Heart failure Maternal Grandmother   . Heart attack Maternal Grandfather   . Cancer Paternal Grandmother        unsure of type, had mets in colon  . Heart attack Paternal Grandfather   . Cancer Paternal Grandfather        unknown  . Breast cancer Neg Hx   . Cervical cancer Neg Hx      ALLERGIES:  is allergic to sulfa antibiotics.  MEDICATIONS:  Current Outpatient Medications  Medication Sig Dispense Refill  . acetaminophen (TYLENOL) 500 MG tablet Take 500 mg by mouth every 6 (six) hours as needed (for pain.).    Marland Kitchen apixaban (ELIQUIS) 5 MG TABS tablet Take 1 tablet (5 mg total) by mouth 2 (two) times daily. (Patient taking differently: Take 2.5 mg by mouth 2 (two) times daily. ) 60 tablet 3  . Blood Glucose Monitoring Suppl (FREESTYLE LITE) DEVI     . dapagliflozin propanediol (FARXIGA) 10 MG TABS tablet Take 10 mg by mouth daily. (Patient taking differently: Take 10 mg by mouth daily at 6 PM. ) 90 tablet 3  . ferrous sulfate 325 (65 FE) MG tablet Take 325 mg by mouth 3 (three) times a week.     Marland Kitchen FREESTYLE LITE test strip     . FreeStyle Unistick II Lancets MISC Use as instructed to check blood glucose  daily 100 each 5  . glucose blood (FREESTYLE TEST STRIPS) test strip Use as instructed to check blood glucose daily 100 each 12  . glucose blood test strip     . glucose monitoring kit (FREESTYLE) monitoring kit 1 each by Does not apply route as needed for other. 1 each 0  . Lancets (FREESTYLE) lancets     . lisinopril (ZESTRIL) 5 MG tablet Take 1 tablet (5 mg total) by mouth daily. (Patient taking differently: Take 5 mg by mouth daily with supper. ) 90 tablet 3  . loratadine (CLARITIN) 10 MG tablet Take 1 tablet (10 mg total) by mouth daily. 90 tablet 3  . metFORMIN (GLUCOPHAGE) 1000 MG tablet Take 1 tablet (1,000 mg total) by mouth 2 (two) times daily with a meal. 180 tablet 3  . Multiple Vitamin (MULTIVITAMIN WITH MINERALS) TABS tablet Take 1 tablet by mouth 2 (two) times a week.    Marland Kitchen omeprazole (PRILOSEC) 20 MG capsule TAKE 1 CAPSULE (20 MG TOTAL) BY MOUTH DAILY. 90 capsule 3  . rosuvastatin (CRESTOR) 5 MG tablet Take 1 tablet (5 mg total) by mouth daily. (Patient taking differently: Take 5 mg by mouth every evening. ) 90 tablet 3  . vitamin B-12 (CYANOCOBALAMIN) 1000  MCG tablet Take 1 tablet (1,000 mcg total) by mouth daily. 90 tablet 1  . traMADol (ULTRAM) 50 MG tablet Take 1 tablet (50 mg total) by mouth every 6 (six) hours as needed. (Patient not taking: Reported on 11/24/2019) 10 tablet 1   No current facility-administered medications for this visit.     PHYSICAL EXAMINATION: ECOG PERFORMANCE STATUS: 0 - Asymptomatic Vitals:   11/24/19 1000  BP: 112/75  Pulse: 75  Resp: 18  Temp: (!) 97.1 F (36.2 C)   Filed Weights   11/24/19 1000  Weight: 231 lb 14.4 oz (105.2 kg)    Physical Exam Constitutional:      General: She is not in acute distress. HENT:     Head: Normocephalic and atraumatic.  Eyes:     General: No scleral icterus.    Pupils: Pupils are equal, round, and reactive to light.  Cardiovascular:     Rate and Rhythm: Normal rate and regular rhythm.     Heart sounds: Normal heart sounds.  Pulmonary:     Effort: Pulmonary effort is normal. No respiratory distress.     Breath sounds: No wheezing.  Abdominal:     General: Bowel sounds are normal. There is no distension.     Palpations: Abdomen is soft. There is no mass.     Tenderness: There is no abdominal tenderness.  Musculoskeletal:        General: No deformity. Normal range of motion.     Cervical back: Normal range of motion and neck supple.  Skin:    General: Skin is warm and dry.     Findings: No erythema or rash.  Neurological:     Mental Status: She is alert and oriented to person, place, and time.     Cranial Nerves: No cranial nerve deficit.     Coordination: Coordination normal.  Psychiatric:        Behavior: Behavior normal.        Thought Content: Thought content normal.     LABORATORY DATA:  I have reviewed the data as listed Lab Results  Component Value Date   WBC 4.2 11/23/2019   HGB 11.4 (L) 11/23/2019   HCT 34.4 (L) 11/23/2019   MCV 80.8 11/23/2019  PLT 92 (L) 11/23/2019   Recent Labs    03/14/19 1554 06/26/19 0908 11/23/19 0806  NA  142 141 139  K 4.1 4.2 4.2  CL 110 108* 109  CO2 22 19* 20*  GLUCOSE 106* 136* 186*  BUN 15 15 15   CREATININE 1.03* 1.13* 1.12*  CALCIUM 9.5 9.3 9.2  GFRNONAA 59* 53* 53*  GFRAA >60 61 >60  PROT 7.8 6.7 7.3  ALBUMIN 4.2 4.0 3.9  AST 32 32 42*  ALT 24 20 26   ALKPHOS 98 92 88  BILITOT 1.0 0.8 1.2   Iron/TIBC/Ferritin/ %Sat No results found for: IRON, TIBC, FERRITIN, IRONPCTSAT    RADIOGRAPHIC STUDIES: I have personally reviewed the radiological images as listed and agreed with the findings in the report.  VAS [place] LOWER EXTREMITY VENOUS REFLUX  Result Date: 11/16/2019  Lower Venous Reflux Study Indications: Venous insufficiency.  Risk Factors: Surgery Previous left lower extremity vein intervention at [facility]. Performing Technologist: Ralene Cork RVT  Examination Guidelines: A complete evaluation includes B-mode imaging, spectral Doppler, color Doppler, and power Doppler as needed of all accessible portions of each vessel. Bilateral testing is considered an integral part of a complete examination. Limited examinations for reoccurring indications may be performed as noted. The reflux portion of the exam is performed with the patient in reverse Trendelenburg. Significant venous reflux is defined as >500 ms in the superficial venous system, and >1 second in the deep venous system.  +--------------+---------+------+----------+-----------+----------------------+ LEFT          Reflux NoReflux  Reflux   Diameter  Comments                                       Yes     Time       cms                           +--------------+---------+------+----------+-----------+----------------------+ CFV                     yes  >1 second                                   +--------------+---------+------+----------+-----------+----------------------+ FV mid        no                                                          +--------------+---------+------+----------+-----------+----------------------+ Popliteal     no                                                         +--------------+---------+------+----------+-----------+----------------------+ GSV at SFJ              yes   >500 ms     0.804                          +--------------+---------+------+----------+-----------+----------------------+ GSV prox thigh  yes   >500 ms     0.599                          +--------------+---------+------+----------+-----------+----------------------+ GSV mid thigh           yes   >500 ms     0.607                          +--------------+---------+------+----------+-----------+----------------------+ GSV dist thigh          yes   >500 ms     0.591                          +--------------+---------+------+----------+-----------+----------------------+ GSV at knee                                       varicosed              +--------------+---------+------+----------+-----------+----------------------+ GSV prox calf                                     varicosed              +--------------+---------+------+----------+-----------+----------------------+ SSV Pop Fossa           yes   >500 ms     0.274   SPJ and thigh                                                            extension              +--------------+---------+------+----------+-----------+----------------------+ SSV prox calf no                          0.228                          +--------------+---------+------+----------+-----------+----------------------+ SSV mid calf  no                          0.232                          +--------------+---------+------+----------+-----------+----------------------+   Summary: Left: - No evidence of deep vein thrombosis seen in the left lower extremity, from the common femoral through the popliteal veins. - No evidence of superficial venous thrombosis  in the left lower extremity. - Deep vein reflux in the CFV. - Superficial vein reflux in the SSV at the popliteal fossa. - Superficial vein reflux in the SFJ and GSV. Vein is patent and varicosed at the knee and proximal calf at the area of prior intervention.  *See table(s) above for measurements and observations. Electronically signed by Deitra Mayo MD on 11/16/2019 at 2:41:04 PM.    Final       ASSESSMENT & PLAN:  1. History of DVT (deep vein thrombosis)   2. Endometrial cancer ([place])   3. Thrombocytopenia ([place])   4. Abnormal liver ultrasound    #  Endometrioid endometrial cancer, plan for surgery.  Follow-up with gynecology oncology.  #History of unprovoked DVT Likely secondary to lower extremity varicose. No family history of thrombosis. Patient has been on Eliquis 2.5 g twice daily for anticoagulation prophylaxis Patient is at high risk of thrombosis perioperatively. I agree with Dr. Gershon Crane that she will stop Eliquis 2.5 mg twice daily 2 days prior to the procedure, she will be on prophylactic Lovenox/Heparin and then postoperatively, during the course of Eliquis 5 mg twice  daily for 1 month.  After that to resume Eliquis 2.5 mg twice daily.  I will also obtain hypercoagulable state work-up-antiphospholipid profile, factor 5 mutation, prothrombin gene mutation, Antithrombin III, protein C&S. -I asked patient to hold Eliquis for 2 days prior to the testing.  #Thrombocytopenia, previous work-up was not remarkable except borderline vitamin B12. We will check vitamin B12, folate, check immature platelet fraction. Her platelet count seems to have decreased since last visit may be secondary to recent bleeding events.  Continue to monitor.  We discussed about the possibility of bone marrow biopsy if platelet counts persistently aggressively worsening.  #Normal liver ultrasound, likely fatty liver disease, recommend patient to follow-up with [facility] gastroenterology for further  evaluation.  Discussed about healthy diet and exercise.   Orders Placed This Encounter  Procedures  . ANTIPHOSPHOLIPID SYNDROME PROF    Standing Status:   Future    Number of Occurrences:   1    Standing Expiration Date:   11/23/2020  . Factor 5 leiden    Standing Status:   Future    Number of Occurrences:   1    Standing Expiration Date:   11/23/2020  . Prothrombin gene mutation    Standing Status:   Future    Number of Occurrences:   1    Standing Expiration Date:   11/23/2020  . Protein S, total and free    Standing Status:   Future    Number of Occurrences:   1    Standing Expiration Date:   11/23/2020  . Protein C activity    Standing Status:   Future    Number of Occurrences:   1    Standing Expiration Date:   11/23/2020  . Antithrombin III    Standing Status:   Future    Number of Occurrences:   1    Standing Expiration Date:   11/23/2020  . Folate    Standing Status:   Future    Number of Occurrences:   1    Standing Expiration Date:   11/23/2020  . Vitamin B12    Standing Status:   Future    Number of Occurrences:   1    Standing Expiration Date:   11/23/2020  . CBC with Differential/Platelet    Standing Status:   Future    Number of Occurrences:   1    Standing Expiration Date:   11/23/2020  . Immature Platelet Fraction    Standing Status:   Future    Number of Occurrences:   1    Standing Expiration Date:   11/23/2020    All questions were answered. The patient knows to call the clinic with any problems questions or concerns. CC Dr. Emelia Salisbury, Dionne Bucy, MD    Return of visit: To be determined.  Earlie Server, MD, PhD Hematology Oncology [facility] Pager- [phone] 11/24/2019

## 2019-11-27 ENCOUNTER — Inpatient Hospital Stay: Payer: 59

## 2019-11-27 ENCOUNTER — Other Ambulatory Visit: Payer: Self-pay

## 2019-11-27 DIAGNOSIS — K219 Gastro-esophageal reflux disease without esophagitis: Secondary | ICD-10-CM | POA: Diagnosis not present

## 2019-11-27 DIAGNOSIS — Z86718 Personal history of other venous thrombosis and embolism: Secondary | ICD-10-CM | POA: Diagnosis not present

## 2019-11-27 DIAGNOSIS — D696 Thrombocytopenia, unspecified: Secondary | ICD-10-CM | POA: Diagnosis not present

## 2019-11-27 DIAGNOSIS — Z7901 Long term (current) use of anticoagulants: Secondary | ICD-10-CM | POA: Diagnosis not present

## 2019-11-27 DIAGNOSIS — C541 Malignant neoplasm of endometrium: Secondary | ICD-10-CM | POA: Diagnosis not present

## 2019-11-27 DIAGNOSIS — E119 Type 2 diabetes mellitus without complications: Secondary | ICD-10-CM | POA: Diagnosis not present

## 2019-11-27 DIAGNOSIS — Z6839 Body mass index (BMI) 39.0-39.9, adult: Secondary | ICD-10-CM | POA: Diagnosis not present

## 2019-11-27 DIAGNOSIS — E785 Hyperlipidemia, unspecified: Secondary | ICD-10-CM | POA: Diagnosis not present

## 2019-11-27 LAB — CBC WITH DIFFERENTIAL/PLATELET
Abs Immature Granulocytes: 0.01 10*3/uL (ref 0.00–0.07)
Basophils Absolute: 0 10*3/uL (ref 0.0–0.1)
Basophils Relative: 1 %
Eosinophils Absolute: 0.1 10*3/uL (ref 0.0–0.5)
Eosinophils Relative: 3 %
HCT: 36.2 % (ref 36.0–46.0)
Hemoglobin: 11.8 g/dL — ABNORMAL LOW (ref 12.0–15.0)
Immature Granulocytes: 0 %
Lymphocytes Relative: 34 %
Lymphs Abs: 1.4 10*3/uL (ref 0.7–4.0)
MCH: 26.3 pg (ref 26.0–34.0)
MCHC: 32.6 g/dL (ref 30.0–36.0)
MCV: 80.8 fL (ref 80.0–100.0)
Monocytes Absolute: 0.2 10*3/uL (ref 0.1–1.0)
Monocytes Relative: 6 %
Neutro Abs: 2.3 10*3/uL (ref 1.7–7.7)
Neutrophils Relative %: 56 %
Platelets: 102 10*3/uL — ABNORMAL LOW (ref 150–400)
RBC: 4.48 MIL/uL (ref 3.87–5.11)
RDW: 14.3 % (ref 11.5–15.5)
WBC: 4.2 10*3/uL (ref 4.0–10.5)
nRBC: 0 % (ref 0.0–0.2)

## 2019-11-27 LAB — VITAMIN B12: Vitamin B-12: 1710 pg/mL — ABNORMAL HIGH (ref 180–914)

## 2019-11-27 LAB — ANTITHROMBIN III: AntiThromb III Func: 95 % (ref 75–120)

## 2019-11-27 LAB — FOLATE: Folate: 19.5 ng/mL (ref >5.9)

## 2019-11-27 LAB — IMMATURE PLATELET FRACTION: Immature Platelet Fraction: 2.4 % (ref 1.2–8.6)

## 2019-11-28 LAB — PROTEIN S, TOTAL AND FREE
Protein S Ag, Free: 131 % (ref 57–157)
Protein S Ag, Total: 87 % (ref 60–150)

## 2019-11-28 LAB — ANTIPHOSPHOLIPID SYNDROME PROF
Anticardiolipin IgG: <9 GPL U/mL (ref 0–14)
Anticardiolipin IgM: 10 MPL U/mL (ref 0–12)
DRVVT: 33 s (ref 0.0–47.0)
PTT Lupus Anticoagulant: 31 s (ref 0.0–51.9)

## 2019-11-28 LAB — PROTEIN C ACTIVITY: Protein C Activity: 152 % (ref 73–180)

## 2019-11-29 ENCOUNTER — Ambulatory Visit: Payer: 59

## 2019-11-30 ENCOUNTER — Other Ambulatory Visit: Payer: Self-pay

## 2019-11-30 ENCOUNTER — Encounter
Admission: RE | Admit: 2019-11-30 | Discharge: 2019-11-30 | Disposition: A | Discharge status: Home / Self Care | Payer: 59 | Source: Ambulatory Visit | Attending: Obstetrics and Gynecology | Admitting: Obstetrics and Gynecology

## 2019-11-30 LAB — PROTHROMBIN GENE MUTATION

## 2019-11-30 NOTE — Patient Instructions (Signed)
COVID TESTING Date: December 04, 2019 [facility] Testing site:  [place] Thru Hours:  6:78 am - 1:00 pm Once you are tested, you are asked to stay quarantined (avoiding public places) until after your surgery.   Your procedure is scheduled on: December 06, 2019 [facility] Report to Day Surgery on the 2nd floor of the [place] [facility]. To find out your arrival time, please call [phone] between 1PM - 3PM on: December 05, 2019  REMEMBER: Instructions that are not followed completely may result in serious medical risk, up to and including death; or upon the discretion of your surgeon and anesthesiologist your surgery may need to be rescheduled.  Do not eat food after midnight the night before surgery.  No gum chewing, lozengers or hard candies.  You may however, drink CLEAR liquids up to 2 hours before you are scheduled to arrive for your surgery. Do not drink anything within 2 hours of your scheduled arrival time.  Clear liquids include: - water   Do NOT drink anything that is not on this list.  Type 1 and Type 2 diabetics should only drink water.  G2 GATORADE DRINK :  Complete drinking 2 hours prior to scheduled arrival time.  TAKE THESE MEDICATIONS THE MORNING OF SURGERY WITH A SIP OF WATER: LORATADINE OMEPRAZOLE  (take one the night before and one on the morning of surgery - helps to prevent nausea after surgery.)  Stop Metformin 2 days prior to surgery. LAST DOSE December 03, 2019   STOP ELIQUIS LAST DOSE December 03, 2019  Stop Anti-inflammatories (NSAIDS) such as Advil, Aleve, Ibuprofen, Motrin, Naproxen, Naprosyn and Aspirin based products such as Excedrin, Goodys Powder, BC Powder. (May take Tylenol or Acetaminophen if needed.)  Stop ANY OVER THE COUNTER supplements until after surgery. (May continue Vitamin D, Vitamin B, and multivitamin.)  No Alcohol for 24 hours before or after surgery.  No Smoking including e-cigarettes for 24  hours prior to surgery.  No chewable tobacco products for at least 6 hours prior to surgery.  No nicotine patches on the day of surgery.  Do not use any "recreational" drugs for at least a week prior to your surgery.  Please be advised that the combination of cocaine and anesthesia may have negative outcomes, up to and including death. If you test positive for cocaine, your surgery will be cancelled.  On the morning of surgery brush your teeth with toothpaste and water, you may rinse your mouth with mouthwash if you wish. Do not swallow any toothpaste or mouthwash.  Do not wear jewelry, make-up, hairpins, clips or nail polish.  Do not wear lotions, powders, or perfumes OR DEODORANT  Do not shave 48 hours prior to surgery.   Contact lenses, hearing aids and dentures may not be worn into surgery.  Do not bring valuables to the hospital. [facility] is not responsible for any missing/lost belongings or valuables.   Use CHG Soap  as directed on instruction sheet.  Notify your doctor if there is any change in your medical condition (cold, fever, infection).  Wear comfortable clothing (specific to your surgery type) to the hospital.  Plan for stool softeners for home use; pain medications have a tendency to cause constipation. You can also help prevent constipation by eating foods high in fiber such as fruits and vegetables and drinking plenty of fluids as your diet allows.  After surgery, you can help prevent lung complications by doing breathing exercises.  Take deep  breaths and cough every 1-2 hours. Your doctor may order a device called an Incentive Spirometer to help you take deep breaths. When coughing or sneezing, hold a pillow firmly against your incision with both hands. This is called "splinting." Doing this helps protect your incision. It also decreases belly discomfort.  If you are being admitted to the hospital overnight, you may bring a small bag with you.  If you are  being discharged the day of surgery, you will not be allowed to drive home. You will need a responsible adult (18 years or older) to drive you home and stay with you that night.   If you are taking public transportation, you will need to have a responsible adult (18 years or older) with you. Please confirm with your physician that it is acceptable to use public transportation.   Please call the [place] Dept. at [phone] if you have any questions about these instructions.  Visitation Policy:  Patients undergoing a surgery or procedure may have one family member or support person with them as long as that person is not COVID-19 positive or experiencing its symptoms.  That person may remain in the waiting area during the procedure.  Children under 39 years of age may have both parents or legal guardians with them during their procedure.  Inpatient Visitation Update:   In an effort to ensure the safety of our team members and our patients, we are implementing a change to our visitation policy:  Effective Monday, Aug. 9, at 7 a.m., inpatients will be allowed one support person.  o The support person may change daily.  o The support person must pass our screening, gel in and out, and wear a mask at all times, including in the patient's room.  o Patients must also wear a mask when staff or their support person are in the room.  o Masking is required regardless of vaccination status.  Systemwide, no visitors 17 or younger.

## 2019-12-01 ENCOUNTER — Other Ambulatory Visit
Admission: RE | Admit: 2019-12-01 | Discharge: 2019-12-01 | Disposition: A | Discharge status: Home / Self Care | Payer: 59 | Source: Ambulatory Visit | Attending: Obstetrics and Gynecology | Admitting: Obstetrics and Gynecology

## 2019-12-01 DIAGNOSIS — E119 Type 2 diabetes mellitus without complications: Secondary | ICD-10-CM | POA: Insufficient documentation

## 2019-12-01 DIAGNOSIS — Z01818 Encounter for other preprocedural examination: Secondary | ICD-10-CM | POA: Insufficient documentation

## 2019-12-01 LAB — SAMPLE TO BLOOD BANK

## 2019-12-04 ENCOUNTER — Other Ambulatory Visit: Payer: Self-pay

## 2019-12-04 ENCOUNTER — Other Ambulatory Visit
Admission: RE | Admit: 2019-12-04 | Discharge: 2019-12-04 | Disposition: A | Discharge status: Home / Self Care | Payer: 59 | Source: Ambulatory Visit | Attending: Obstetrics and Gynecology | Admitting: Obstetrics and Gynecology

## 2019-12-04 DIAGNOSIS — K219 Gastro-esophageal reflux disease without esophagitis: Secondary | ICD-10-CM | POA: Diagnosis not present

## 2019-12-04 DIAGNOSIS — E785 Hyperlipidemia, unspecified: Secondary | ICD-10-CM | POA: Diagnosis not present

## 2019-12-04 DIAGNOSIS — E119 Type 2 diabetes mellitus without complications: Secondary | ICD-10-CM | POA: Diagnosis not present

## 2019-12-04 DIAGNOSIS — Z20822 Contact with and (suspected) exposure to covid-19: Secondary | ICD-10-CM | POA: Insufficient documentation

## 2019-12-04 DIAGNOSIS — Z01812 Encounter for preprocedural laboratory examination: Secondary | ICD-10-CM | POA: Insufficient documentation

## 2019-12-04 DIAGNOSIS — N838 Other noninflammatory disorders of ovary, fallopian tube and broad ligament: Secondary | ICD-10-CM | POA: Diagnosis not present

## 2019-12-04 DIAGNOSIS — I8393 Asymptomatic varicose veins of bilateral lower extremities: Secondary | ICD-10-CM | POA: Diagnosis not present

## 2019-12-04 DIAGNOSIS — Z6839 Body mass index (BMI) 39.0-39.9, adult: Secondary | ICD-10-CM | POA: Diagnosis not present

## 2019-12-04 DIAGNOSIS — D696 Thrombocytopenia, unspecified: Secondary | ICD-10-CM | POA: Diagnosis not present

## 2019-12-04 DIAGNOSIS — C541 Malignant neoplasm of endometrium: Secondary | ICD-10-CM | POA: Diagnosis not present

## 2019-12-04 DIAGNOSIS — N950 Postmenopausal bleeding: Secondary | ICD-10-CM | POA: Diagnosis not present

## 2019-12-04 LAB — SARS CORONAVIRUS 2 (TAT 6-24 HRS): SARS Coronavirus 2: NEGATIVE

## 2019-12-04 LAB — FACTOR 5 LEIDEN

## 2019-12-04 NOTE — Pre-Procedure Instructions (Signed)
Attempted to reach patient at her home and cell number without success.  She was scheduled to arrive today for her covid test.  Left message for her to arrive first thing in the morning if she is still moving ahead with her surgery.

## 2019-12-06 ENCOUNTER — Encounter
Admission: RE | Disposition: A | Discharge status: Home / Self Care | Payer: Self-pay | Source: Home / Self Care | Attending: Obstetrics and Gynecology

## 2019-12-06 ENCOUNTER — Other Ambulatory Visit: Payer: Self-pay

## 2019-12-06 ENCOUNTER — Inpatient Hospital Stay
Admission: RE | Admit: 2019-12-06 | Discharge: 2019-12-08 | DRG: 741 | Disposition: A | Discharge status: Home / Self Care | Payer: 59 | Attending: Obstetrics and Gynecology | Admitting: Obstetrics and Gynecology

## 2019-12-06 ENCOUNTER — Inpatient Hospital Stay: Payer: 59 | Admitting: Anesthesiology

## 2019-12-06 ENCOUNTER — Encounter: Payer: Self-pay | Admitting: Obstetrics and Gynecology

## 2019-12-06 DIAGNOSIS — C541 Malignant neoplasm of endometrium: Secondary | ICD-10-CM | POA: Diagnosis not present

## 2019-12-06 DIAGNOSIS — I8393 Asymptomatic varicose veins of bilateral lower extremities: Secondary | ICD-10-CM | POA: Diagnosis present

## 2019-12-06 DIAGNOSIS — E1100 Type 2 diabetes mellitus with hyperosmolarity without nonketotic hyperglycemic-hyperosmolar coma (NKHHC): Secondary | ICD-10-CM

## 2019-12-06 DIAGNOSIS — Z7901 Long term (current) use of anticoagulants: Secondary | ICD-10-CM

## 2019-12-06 DIAGNOSIS — Z01812 Encounter for preprocedural laboratory examination: Secondary | ICD-10-CM

## 2019-12-06 DIAGNOSIS — Z20822 Contact with and (suspected) exposure to covid-19: Secondary | ICD-10-CM | POA: Diagnosis present

## 2019-12-06 DIAGNOSIS — I82411 Acute embolism and thrombosis of right femoral vein: Secondary | ICD-10-CM

## 2019-12-06 DIAGNOSIS — K219 Gastro-esophageal reflux disease without esophagitis: Secondary | ICD-10-CM | POA: Diagnosis present

## 2019-12-06 DIAGNOSIS — Z86718 Personal history of other venous thrombosis and embolism: Secondary | ICD-10-CM

## 2019-12-06 DIAGNOSIS — E119 Type 2 diabetes mellitus without complications: Secondary | ICD-10-CM | POA: Diagnosis present

## 2019-12-06 DIAGNOSIS — N950 Postmenopausal bleeding: Secondary | ICD-10-CM | POA: Diagnosis present

## 2019-12-06 DIAGNOSIS — D696 Thrombocytopenia, unspecified: Secondary | ICD-10-CM | POA: Diagnosis present

## 2019-12-06 DIAGNOSIS — Z90710 Acquired absence of both cervix and uterus: Secondary | ICD-10-CM | POA: Diagnosis present

## 2019-12-06 DIAGNOSIS — Z7984 Long term (current) use of oral hypoglycemic drugs: Secondary | ICD-10-CM

## 2019-12-06 DIAGNOSIS — Z6839 Body mass index (BMI) 39.0-39.9, adult: Secondary | ICD-10-CM

## 2019-12-06 HISTORY — PX: ROBOTIC ASSISTED TOTAL HYSTERECTOMY WITH BILATERAL SALPINGO OOPHERECTOMY: SHX6086

## 2019-12-06 HISTORY — PX: SENTINEL NODE BIOPSY: SHX6608

## 2019-12-06 LAB — COMPREHENSIVE METABOLIC PANEL
ALT: 25 U/L (ref 0–44)
AST: 44 U/L — ABNORMAL HIGH (ref 15–41)
Albumin: 3.5 g/dL (ref 3.5–5.0)
Alkaline Phosphatase: 68 U/L (ref 38–126)
Anion gap: 9 (ref 5–15)
BUN: 13 mg/dL (ref 8–23)
CO2: 21 mmol/L — ABNORMAL LOW (ref 22–32)
Calcium: 8.4 mg/dL — ABNORMAL LOW (ref 8.9–10.3)
Chloride: 107 mmol/L (ref 98–111)
Creatinine, Ser: 1.19 mg/dL — ABNORMAL HIGH (ref 0.44–1.00)
GFR calc Af Amer: 57 mL/min — ABNORMAL LOW (ref >60)
GFR calc non Af Amer: 49 mL/min — ABNORMAL LOW (ref >60)
Glucose, Bld: 170 mg/dL — ABNORMAL HIGH (ref 70–99)
Potassium: 4.5 mmol/L (ref 3.5–5.1)
Sodium: 137 mmol/L (ref 135–145)
Total Bilirubin: 1.5 mg/dL — ABNORMAL HIGH (ref 0.3–1.2)
Total Protein: 6.3 g/dL — ABNORMAL LOW (ref 6.5–8.1)

## 2019-12-06 LAB — GLUCOSE, CAPILLARY
Glucose-Capillary: 142 mg/dL — ABNORMAL HIGH (ref 70–99)
Glucose-Capillary: 142 mg/dL — ABNORMAL HIGH (ref 70–99)
Glucose-Capillary: 164 mg/dL — ABNORMAL HIGH (ref 70–99)
Glucose-Capillary: 181 mg/dL — ABNORMAL HIGH (ref 70–99)

## 2019-12-06 LAB — CBC
HCT: 33.7 % — ABNORMAL LOW (ref 36.0–46.0)
Hemoglobin: 10.6 g/dL — ABNORMAL LOW (ref 12.0–15.0)
MCH: 26.4 pg (ref 26.0–34.0)
MCHC: 31.5 g/dL (ref 30.0–36.0)
MCV: 84 fL (ref 80.0–100.0)
Platelets: 78 10*3/uL — ABNORMAL LOW (ref 150–400)
RBC: 4.01 MIL/uL (ref 3.87–5.11)
RDW: 14.7 % (ref 11.5–15.5)
WBC: 5.3 10*3/uL (ref 4.0–10.5)
nRBC: 0 % (ref 0.0–0.2)

## 2019-12-06 SURGERY — HYSTERECTOMY, TOTAL, ROBOT-ASSISTED, LAPAROSCOPIC, WITH BILATERAL SALPINGO-OOPHORECTOMY
Anesthesia: General

## 2019-12-06 MED ORDER — PROPOFOL 10 MG/ML IV BOLUS
INTRAVENOUS | Status: DC | PRN
  Administered 2019-12-06: 150 mg via INTRAVENOUS

## 2019-12-06 MED ORDER — DEXAMETHASONE SODIUM PHOSPHATE 10 MG/ML IJ SOLN
INTRAMUSCULAR | Status: DC | PRN
  Administered 2019-12-06: 10 mg via INTRAVENOUS

## 2019-12-06 MED ORDER — ACETAMINOPHEN 10 MG/ML IV SOLN
INTRAVENOUS | Status: AC
  Filled 2019-12-06: qty 100

## 2019-12-06 MED ORDER — ROCURONIUM BROMIDE 10 MG/ML (PF) SYRINGE
PREFILLED_SYRINGE | INTRAVENOUS | Status: AC
  Filled 2019-12-06: qty 10

## 2019-12-06 MED ORDER — KETOROLAC TROMETHAMINE 30 MG/ML IJ SOLN
INTRAMUSCULAR | Status: AC
  Filled 2019-12-06: qty 1

## 2019-12-06 MED ORDER — PHENYLEPHRINE HCL (PRESSORS) 10 MG/ML IV SOLN
INTRAVENOUS | Status: DC | PRN
  Administered 2019-12-06 (×2): 100 ug via INTRAVENOUS

## 2019-12-06 MED ORDER — HEPARIN SODIUM (PORCINE) 5000 UNIT/ML IJ SOLN
INTRAMUSCULAR | Status: AC
  Filled 2019-12-06: qty 1

## 2019-12-06 MED ORDER — CEFAZOLIN SODIUM-DEXTROSE 2-4 GM/100ML-% IV SOLN
INTRAVENOUS | Status: AC
  Filled 2019-12-06: qty 100

## 2019-12-06 MED ORDER — INSULIN ASPART 100 UNIT/ML ~~LOC~~ SOLN: 3.0000 [IU] | Freq: Three times a day (TID) | SUBCUTANEOUS | Status: DC

## 2019-12-06 MED ORDER — KETAMINE HCL 50 MG/ML IJ SOLN
INTRAMUSCULAR | Status: AC
  Filled 2019-12-06: qty 10

## 2019-12-06 MED ORDER — LACTATED RINGERS IV SOLN: INTRAVENOUS | Status: DC | PRN

## 2019-12-06 MED ORDER — ONDANSETRON HCL 4 MG/2ML IJ SOLN
4.0000 mg | Freq: Four times a day (QID) | INTRAMUSCULAR | Status: DC | PRN
  Administered 2019-12-07: 4 mg via INTRAVENOUS
  Filled 2019-12-06: qty 2

## 2019-12-06 MED ORDER — MICROFIBRILLAR COLL HEMOSTAT EX PADS
MEDICATED_PAD | CUTANEOUS | Status: DC | PRN
  Administered 2019-12-06: 1 via TOPICAL

## 2019-12-06 MED ORDER — CHLORHEXIDINE GLUCONATE 0.12 % MT SOLN
15.0000 mL | Freq: Once | OROMUCOSAL | Status: AC
  Administered 2019-12-06: 15 mL via OROMUCOSAL

## 2019-12-06 MED ORDER — HEPARIN SODIUM (PORCINE) 5000 UNIT/ML IJ SOLN
5000.0000 [IU] | INTRAMUSCULAR | Status: AC
  Administered 2019-12-06: 5000 [IU] via SUBCUTANEOUS

## 2019-12-06 MED ORDER — MIDAZOLAM HCL 2 MG/2ML IJ SOLN
INTRAMUSCULAR | Status: DC | PRN
  Administered 2019-12-06: 2 mg via INTRAVENOUS

## 2019-12-06 MED ORDER — SUGAMMADEX SODIUM 500 MG/5ML IV SOLN
INTRAVENOUS | Status: DC | PRN
  Administered 2019-12-06: 210 mg via INTRAVENOUS

## 2019-12-06 MED ORDER — KETOROLAC TROMETHAMINE 30 MG/ML IJ SOLN
INTRAMUSCULAR | Status: DC | PRN
  Administered 2019-12-06: 30 mg via INTRAVENOUS

## 2019-12-06 MED ORDER — STERILE WATER FOR INJECTION IJ SOLN
INTRAMUSCULAR | Status: DC | PRN
  Administered 2019-12-06: 4 mL via INTRAMUSCULAR

## 2019-12-06 MED ORDER — PANTOPRAZOLE SODIUM 40 MG PO TBEC
40.0000 mg | DELAYED_RELEASE_TABLET | Freq: Every day | ORAL | Status: DC
  Administered 2019-12-07 – 2019-12-08 (×2): 40 mg via ORAL
  Filled 2019-12-06 (×2): qty 1

## 2019-12-06 MED ORDER — LIDOCAINE HCL (PF) 2 % IJ SOLN
INTRAMUSCULAR | Status: AC
  Filled 2019-12-06: qty 5

## 2019-12-06 MED ORDER — ONDANSETRON HCL 4 MG PO TABS: 4.0000 mg | ORAL_TABLET | Freq: Four times a day (QID) | ORAL | Status: DC | PRN

## 2019-12-06 MED ORDER — BUPIVACAINE HCL (PF) 0.5 % IJ SOLN
INTRAMUSCULAR | Status: AC
  Filled 2019-12-06: qty 30

## 2019-12-06 MED ORDER — BUPIVACAINE HCL (PF) 0.5 % IJ SOLN
INTRAMUSCULAR | Status: DC | PRN
  Administered 2019-12-06: 18 mL

## 2019-12-06 MED ORDER — HYDROMORPHONE HCL 1 MG/ML IJ SOLN: 0.2000 mg | INTRAMUSCULAR | Status: DC | PRN

## 2019-12-06 MED ORDER — ROCURONIUM BROMIDE 100 MG/10ML IV SOLN
INTRAVENOUS | Status: DC | PRN
  Administered 2019-12-06: 50 mg via INTRAVENOUS
  Administered 2019-12-06: 30 mg via INTRAVENOUS

## 2019-12-06 MED ORDER — DEXMEDETOMIDINE HCL 200 MCG/2ML IV SOLN
INTRAVENOUS | Status: DC | PRN
  Administered 2019-12-06: 8 ug via INTRAVENOUS
  Administered 2019-12-06: 12 ug via INTRAVENOUS

## 2019-12-06 MED ORDER — FENTANYL CITRATE (PF) 100 MCG/2ML IJ SOLN
25.0000 ug | INTRAMUSCULAR | Status: DC | PRN
  Administered 2019-12-06 (×3): 25 ug via INTRAVENOUS

## 2019-12-06 MED ORDER — FENTANYL CITRATE (PF) 100 MCG/2ML IJ SOLN
INTRAMUSCULAR | Status: DC | PRN
Start: 2019-12-06 — End: 2019-12-06
  Administered 2019-12-06 (×2): 100 ug via INTRAVENOUS

## 2019-12-06 MED ORDER — DOCUSATE SODIUM 100 MG PO CAPS
100.0000 mg | ORAL_CAPSULE | Freq: Two times a day (BID) | ORAL | Status: DC
  Administered 2019-12-06 – 2019-12-08 (×4): 100 mg via ORAL
  Filled 2019-12-06 (×4): qty 1

## 2019-12-06 MED ORDER — FENTANYL CITRATE (PF) 100 MCG/2ML IJ SOLN
INTRAMUSCULAR | Status: AC
Start: 2019-12-06 — End: ?
  Filled 2019-12-06: qty 2

## 2019-12-06 MED ORDER — POLYETHYLENE GLYCOL 3350 17 G PO PACK
17.0000 g | PACK | Freq: Every day | ORAL | Status: DC | PRN
  Filled 2019-12-06: qty 1

## 2019-12-06 MED ORDER — SODIUM CHLORIDE 0.9 % IV SOLN: INTRAVENOUS | Status: DC

## 2019-12-06 MED ORDER — ONDANSETRON HCL 4 MG/2ML IJ SOLN: 4.0000 mg | Freq: Once | INTRAMUSCULAR | Status: DC | PRN

## 2019-12-06 MED ORDER — INSULIN ASPART 100 UNIT/ML ~~LOC~~ SOLN
0.0000 [IU] | SUBCUTANEOUS | Status: DC
  Administered 2019-12-06 – 2019-12-07 (×3): 4 [IU] via SUBCUTANEOUS
  Administered 2019-12-07 (×3): 3 [IU] via SUBCUTANEOUS
  Administered 2019-12-07 – 2019-12-08 (×3): 4 [IU] via SUBCUTANEOUS
  Administered 2019-12-08: 3 [IU] via SUBCUTANEOUS
  Administered 2019-12-08: 4 [IU] via SUBCUTANEOUS
  Filled 2019-12-06 (×11): qty 1

## 2019-12-06 MED ORDER — LACTATED RINGERS IV SOLN: INTRAVENOUS | Status: DC

## 2019-12-06 MED ORDER — DEXAMETHASONE SODIUM PHOSPHATE 10 MG/ML IJ SOLN
INTRAMUSCULAR | Status: AC
  Filled 2019-12-06: qty 1

## 2019-12-06 MED ORDER — ACETAMINOPHEN 10 MG/ML IV SOLN
INTRAVENOUS | Status: DC | PRN
  Administered 2019-12-06: 1000 mg via INTRAVENOUS

## 2019-12-06 MED ORDER — CEFAZOLIN SODIUM-DEXTROSE 2-4 GM/100ML-% IV SOLN
2.0000 g | INTRAVENOUS | Status: AC
  Administered 2019-12-06: 2 g via INTRAVENOUS

## 2019-12-06 MED ORDER — KETAMINE HCL 10 MG/ML IJ SOLN
INTRAMUSCULAR | Status: DC | PRN
  Administered 2019-12-06: 50 mg via INTRAVENOUS

## 2019-12-06 MED ORDER — BISACODYL 10 MG RE SUPP: 10.0000 mg | Freq: Every day | RECTAL | Status: DC | PRN

## 2019-12-06 MED ORDER — ONDANSETRON HCL 4 MG/2ML IJ SOLN
INTRAMUSCULAR | Status: AC
  Filled 2019-12-06: qty 2

## 2019-12-06 MED ORDER — SIMETHICONE 80 MG PO CHEW
80.0000 mg | CHEWABLE_TABLET | Freq: Four times a day (QID) | ORAL | Status: DC
  Administered 2019-12-06 – 2019-12-08 (×6): 80 mg via ORAL
  Filled 2019-12-06 (×7): qty 1

## 2019-12-06 MED ORDER — MIDAZOLAM HCL 2 MG/2ML IJ SOLN
INTRAMUSCULAR | Status: AC
  Filled 2019-12-06: qty 2

## 2019-12-06 MED ORDER — ORAL CARE MOUTH RINSE: 15.0000 mL | Freq: Once | OROMUCOSAL | Status: AC

## 2019-12-06 MED ORDER — ONDANSETRON HCL 4 MG/2ML IJ SOLN
INTRAMUSCULAR | Status: DC | PRN
  Administered 2019-12-06: 4 mg via INTRAVENOUS

## 2019-12-06 MED ORDER — FENTANYL CITRATE (PF) 100 MCG/2ML IJ SOLN
INTRAMUSCULAR | Status: AC
Start: 2019-12-06 — End: 2019-12-06
  Administered 2019-12-06: 25 ug via INTRAVENOUS
  Filled 2019-12-06: qty 2

## 2019-12-06 MED ORDER — ACETAMINOPHEN 500 MG PO TABS
1000.0000 mg | ORAL_TABLET | Freq: Four times a day (QID) | ORAL | Status: DC
  Administered 2019-12-06 – 2019-12-08 (×6): 1000 mg via ORAL
  Filled 2019-12-06 (×6): qty 2

## 2019-12-06 MED ORDER — OXYCODONE HCL 5 MG PO TABS
5.0000 mg | ORAL_TABLET | ORAL | Status: DC | PRN
  Administered 2019-12-06 – 2019-12-07 (×4): 10 mg via ORAL
  Administered 2019-12-07: 5 mg via ORAL
  Administered 2019-12-07: 10 mg via ORAL
  Administered 2019-12-07: 5 mg via ORAL
  Administered 2019-12-07 – 2019-12-08 (×3): 10 mg via ORAL
  Filled 2019-12-06: qty 1
  Filled 2019-12-06 (×9): qty 2

## 2019-12-06 MED ORDER — PROPOFOL 10 MG/ML IV BOLUS
INTRAVENOUS | Status: AC
  Filled 2019-12-06: qty 20

## 2019-12-06 MED ORDER — FENTANYL CITRATE (PF) 100 MCG/2ML IJ SOLN
INTRAMUSCULAR | Status: AC
  Filled 2019-12-06: qty 2

## 2019-12-06 MED ORDER — FIBRIN SEALANT 2 ML SINGLE DOSE KIT
PACK | CUTANEOUS | Status: DC | PRN
  Administered 2019-12-06 (×2): 2 mL via TOPICAL

## 2019-12-06 MED ORDER — LIDOCAINE HCL (CARDIAC) PF 100 MG/5ML IV SOSY
PREFILLED_SYRINGE | INTRAVENOUS | Status: DC | PRN
  Administered 2019-12-06: 30 mg via INTRAVENOUS

## 2019-12-06 MED ORDER — CHLORHEXIDINE GLUCONATE 0.12 % MT SOLN
OROMUCOSAL | Status: AC
  Filled 2019-12-06: qty 15

## 2019-12-06 SURGICAL SUPPLY — 87 items
"PENCIL ELECTRO HAND CTR " (MISCELLANEOUS) ×2 IMPLANT
ANCHOR TIS RET SYS 235ML (MISCELLANEOUS) ×4 IMPLANT
APPLICATOR VISTASEAL FLEXIBLE (MISCELLANEOUS) ×2 IMPLANT
BAG URINE DRAIN 2000ML AR STRL (UROLOGICAL SUPPLIES) ×4 IMPLANT
BLADE SURG SZ11 CARB STEEL (BLADE) ×8 IMPLANT
CANISTER SUCT 1200ML W/VALVE (MISCELLANEOUS) ×4 IMPLANT
CATH FOLEY 2WAY  5CC 16FR (CATHETERS) ×2
CATH ROBINSON RED 14FR (CATHETERS) IMPLANT
CATH URET ROBINSON RED 14FR (CATHETERS) ×2
CATH URTH 16FR FL 2W BLN LF (CATHETERS) ×2 IMPLANT
CHLORAPREP W/TINT 26 (MISCELLANEOUS) ×4 IMPLANT
COVER TIP SHEARS 8 DVNC (MISCELLANEOUS) ×2 IMPLANT
COVER TIP SHEARS 8MM DA VINCI (MISCELLANEOUS) ×2
COVER WAND RF STERILE (DRAPES) ×4 IMPLANT
DEFOGGER SCOPE WARMER CLEARIFY (MISCELLANEOUS) ×4 IMPLANT
DERMABOND ADVANCED (GAUZE/BANDAGES/DRESSINGS) ×2
DERMABOND ADVANCED .7 DNX12 (GAUZE/BANDAGES/DRESSINGS) ×2 IMPLANT
DRAPE 3/4 80X56 (DRAPES) ×12 IMPLANT
DRAPE ARM DVNC X/XI (DISPOSABLE) ×8 IMPLANT
DRAPE COLUMN DVNC XI (DISPOSABLE) ×2 IMPLANT
DRAPE DA VINCI XI ARM (DISPOSABLE) ×8
DRAPE DA VINCI XI COLUMN (DISPOSABLE) ×2
DRAPE LAP W/FLUID (DRAPES) ×2 IMPLANT
DRAPE LEGGINS SURG 28X43 STRL (DRAPES) ×4 IMPLANT
DRAPE UNDER BUTTOCK W/FLU (DRAPES) ×4 IMPLANT
DRESSING SURGICEL FIBRLLR 1X2 (HEMOSTASIS) IMPLANT
DRSG SURGICEL FIBRILLAR 1X2 (HEMOSTASIS) ×4
DRSG TELFA 3X8 NADH (GAUZE/BANDAGES/DRESSINGS) ×4 IMPLANT
ELECT REM PT RETURN 9FT ADLT (ELECTROSURGICAL) ×4
ELECTRODE REM PT RTRN 9FT ADLT (ELECTROSURGICAL) ×2 IMPLANT
GLOVE SURG SYN 6.5 ES PF (GLOVE) ×16 IMPLANT
GLOVE SURG SYN 6.5 PF PI (GLOVE) ×8 IMPLANT
GOWN STRL REUS W/ TWL LRG LVL3 (GOWN DISPOSABLE) ×8 IMPLANT
GOWN STRL REUS W/TWL LRG LVL3 (GOWN DISPOSABLE) ×8
GRASPER SUT TROCAR 14GX15 (MISCELLANEOUS) ×2 IMPLANT
IRRIGATION STRYKERFLOW (MISCELLANEOUS) IMPLANT
IRRIGATOR STRYKERFLOW (MISCELLANEOUS) ×4
IV NS 1000ML (IV SOLUTION)
IV NS 1000ML BAXH (IV SOLUTION) IMPLANT
KIT IMAGING PINPOINTPAQ (MISCELLANEOUS) ×2 IMPLANT
KIT PINK PAD W/HEAD ARE REST (MISCELLANEOUS) ×4
KIT PINK PAD W/HEAD ARM REST (MISCELLANEOUS) ×2 IMPLANT
LABEL OR SOLS (LABEL) ×4 IMPLANT
MANIPULATOR VCARE LG CRV RETR (MISCELLANEOUS) IMPLANT
MANIPULATOR VCARE SML CRV RETR (MISCELLANEOUS) ×2 IMPLANT
MANIPULATOR VCARE STD CRV RETR (MISCELLANEOUS) IMPLANT
NDL INSUFF ACCESS 14 VERSASTEP (NEEDLE) ×2 IMPLANT
NDL SPNL 22GX3.5 SPINOC (NEEDLE) IMPLANT
NEEDLE HYPO 22GX1.5 SAFETY (NEEDLE) ×6 IMPLANT
NEEDLE SPNL 22GX3.5 SPINOC (NEEDLE) ×4 IMPLANT
NEEDLE VERESS 14GA 120MM (NEEDLE) ×4 IMPLANT
NS IRRIG 1000ML POUR BTL (IV SOLUTION) ×4 IMPLANT
OBTURATOR OPTICAL STANDARD 8MM (TROCAR) ×2
OBTURATOR OPTICAL STND 8 DVNC (TROCAR) ×2
OBTURATOR OPTICALSTD 8 DVNC (TROCAR) ×2 IMPLANT
OCCLUDER COLPOPNEUMO (BALLOONS) ×2 IMPLANT
PACK LAP CHOLECYSTECTOMY (MISCELLANEOUS) ×4 IMPLANT
PAD DRESSING TELFA 3X8 NADH (GAUZE/BANDAGES/DRESSINGS) IMPLANT
PAD OB MATERNITY 4.3X12.25 (PERSONAL CARE ITEMS) ×4 IMPLANT
PAD PREP 24X41 OB/GYN DISP (PERSONAL CARE ITEMS) ×4 IMPLANT
PENCIL ELECTRO HAND CTR (MISCELLANEOUS) ×4 IMPLANT
SCISSORS LAP 5X35 DISP (ENDOMECHANICALS) ×2 IMPLANT
SEAL CANN UNIV 5-8 DVNC XI (MISCELLANEOUS) ×8 IMPLANT
SEAL XI 5MM-8MM UNIVERSAL (MISCELLANEOUS) ×8
SEALER VESSEL DA VINCI XI (MISCELLANEOUS)
SEALER VESSEL EXT DVNC XI (MISCELLANEOUS) IMPLANT
SET CYSTO W/LG BORE CLAMP LF (SET/KITS/TRAYS/PACK) IMPLANT
SET TUBE SMOKE EVAC HIGH FLOW (TUBING) ×4 IMPLANT
SLEEVE VERSASTEP EXPAND ONEST (MISCELLANEOUS) ×4 IMPLANT
SOLUTION ELECTROLUBE (MISCELLANEOUS) ×4 IMPLANT
SPONGE GAUZE 2X2 8PLY STER LF (GAUZE/BANDAGES/DRESSINGS) ×1
SPONGE GAUZE 2X2 8PLY STRL LF (GAUZE/BANDAGES/DRESSINGS) ×1 IMPLANT
SUT DVC VLOC 180 0 12IN GS21 (SUTURE) ×4
SUT MNCRL 4-0 (SUTURE) ×4
SUT MNCRL 4-0 27XMFL (SUTURE) ×4
SUT VIC AB 0 CT1 36 (SUTURE) ×4 IMPLANT
SUT VIC AB 2-0 SH 27 (SUTURE) ×2
SUT VIC AB 2-0 SH 27XBRD (SUTURE) IMPLANT
SUTURE DVC VLC 180 0 12IN GS21 (SUTURE) IMPLANT
SUTURE MNCRL 4-0 27XMF (SUTURE) ×2 IMPLANT
SYR 10ML LL (SYRINGE) ×4 IMPLANT
SYR 30ML LL (SYRINGE) ×4 IMPLANT
SYR 3ML 18GX1 1/2 (SYRINGE) ×4 IMPLANT
SYR 50ML LL SCALE MARK (SYRINGE) ×2 IMPLANT
SYR TOOMEY IRRIG 70ML (MISCELLANEOUS) ×4
SYRINGE TOOMEY IRRIG 70ML (MISCELLANEOUS) IMPLANT
TROCAR XCEL NON-BLD 5MMX100MML (ENDOMECHANICALS) ×2 IMPLANT

## 2019-12-06 NOTE — Transfer of Care (Signed)
Immediate Anesthesia Transfer of Care Note  Patient: Cindy Trujillo  Procedure(s) Performed: XI ROBOTIC ASSISTED TOTAL HYSTERECTOMY WITH BILATERAL SALPINGO OOPHORECTOMY (N/A ) SENTINEL NODE BIOPSY (N/A ) INDOCYANINE GREEN FLUORESCENCE IMAGING (ICG)  Patient Location: PACU  Anesthesia Type:General  Level of Consciousness: drowsy  Airway & Oxygen Therapy: Patient Spontanous Breathing and Patient connected to face mask oxygen  Post-op Assessment: Report given to RN and Post -op Vital signs reviewed and stable  Post vital signs: Reviewed and stable  Last Vitals:  Vitals Value Taken Time  BP 107/59 12/06/19 1548  Temp    Pulse 63 12/06/19 1552  Resp 21 12/06/19 1552  SpO2 100 % 12/06/19 1552  Vitals shown include unvalidated device data.  Last Pain:  Vitals:   12/06/19 0923  TempSrc: Temporal  PainSc: 0-No pain         Complications: No complications documented.

## 2019-12-06 NOTE — Anesthesia Procedure Notes (Signed)
Performed by: Jimmie Dattilio M, CRNA       

## 2019-12-06 NOTE — Op Note (Signed)
Operative Note   Date 12/06/2019 TIME 3:59 PM  PRE-OP DIAGNOSIS: Grade 1 endometrial cancer; Body mass index is 39.82 kg/m.  POST-OP DIAGNOSIS: Grade 1 endometrial cancer, stage 1 pending final pathology; Body mass index is 39.82 kg/m.; Mild pelvic adhesive disease. Possible diverticulosis.   SURGEON: Surgeon(s) and Role: Panel 1:  Giulliana Mcroberts Gaetana Michaelis, MD  ASSISTANT:  Adrian Prows, MD  ANESTHESIA: General  PROCEDURE: Procedure(s): Exam under anesthesia, Robotic assisted total hysterectomy and bilateral salpingo-oophorectomy with sentinel node injection, mapping, and biopsies.  ESTIMATED BLOOD LOSS: 75 cc  DRAINS: Foley  TOTAL IV FLUIDS: 1500 cc  UOP: approximately 800 cc  SPECIMENS:  Uterus, and bilateral fallopian tubes and ovaries, bilateral external iliac artery sentinel nodes, and washings.  COMPLICATIONS: None  DISPOSITION: PACU  CONDITION: Stable  INDICATIONS: Grade 1 endometrial cancer  FINDINGS: Exam under anesthesia revealed an small 6-week mobile anteverted uterus. There were no adnexal masses or nodularity. The parametria was smooth. The cervix was negative for gross lesions. Intraoperative findings included: The uterus was normal size and shape. The adnexa were normal bilaterally. The upper abdomen was normal including omentum, bowel, liver, stomach, and diaphragmatic surfaces. She did have enlarged pelvic sentinel nodes. The sentinel nodes mapped to the right external iliac vein node and left external iliac artery node. She also had an enlarged right external iliac artery node.  PROCEDURE IN DETAIL: After informed consent was obtained, the patient was taken to the operating room where anesthesia was obtained without difficulty. The patient was positioned in the dorsal lithotomy position in [place] and her arms were carefully tucked at her sides and the usual precautions were taken.  She was prepped and draped in normal sterile fashion.  Time-out  was performed.  A speculum was placed in the vagina and the cervical os was dilator. The uterus sounded to 8 cm.  A small V-Care uterine manipulator was then placed in the uterus.  Operative entry was obtained via a supraumbilical incision and direct entry. The robotic Hasson port was placed, abdomen insuffulated, and pelvis visualized with noted findings above.  The patient was placed in Trendelenburg and the bowel was displaced up into the upper abdomen.  The robotic ports and LUQ port placement, and robotic docking was performed. Round ligaments were divided on each side and the retroperitoneal space was opened bilaterally. The infundibulopelvic ligaments were skeletonized, and the ureters were identified and preserved.  The retroperitoneal spaces were opened and sentinel nodes identified as well as the iliac vessels and obturator nerves. Attention was turned to the hysterectomy.   The bilateral infundibular ligaments were sealed and divided.  A bladder flap was created and the bladder was dissected down off the lower uterine segment and cervix. The uterus was manipulated to allow exposure of the uterine arteries. The uterine arteries were skeletonized bilaterally, sealed and divided with cautery and transected.  A colpotomy was performed circumferentially along the V-Care ring with electrocautery and the cervix was incised from the vagina. The V-care and specimen was removed.  A pneumo balloon was placed in the vagina.   A fourth robotic trocar was placed in the LLQ and docked to the robot. The prograsp was inserted to provide better exposure. The sentinel nodes were removed and removed via a laparoscopic bag in the vagina.  The vaginal cuff was then closed in a running continuous fashion using 0 V-Lock suture with careful attention to include the vaginal cuff angles and the vaginal mucosa within the closure.    Hemostasis  was observed. The intraperitoneal pressure was dropped, and all planes of  dissection, vascular pedicles and the vaginal cuff were found to be hemostatic. Vistaseal, a hemostatic agent was placed on the raw surfaces given her need for anticoagualtion. The bladder was backfilled with >210 cc and not leak observed. A bubble test was performed and was negative. The umbilical trocar was removed and the fascia of the umbilical port was closed with 0 Vicryl suture using an Endoclose technique. The remaining ports were removed under direct visualization.  The skin incisions were closed with subcuticular stitch and Indermil glue. The vagina was inspected and she has small lacerations at the anterior and posterior hymenal ring. The posterior laceration was reapproximated with 2-0 Vicryl and was hemostatic.  The patient tolerated the procedure well.  Sponge, lap and needle counts were correct x2.  The patient was taken to recovery room in excellent condition.  Antibiotics: Not given because not indicated by scheduled procedure.   VTE prophylaxis: was ordered perioperatively.   Dr. Gardiner Rhyme assisted me with the procedure which could not have been performed without her assistance. The patient's obesity extended the length and complexity of the procedure given difficulties with visualization and exposure.   Gillis Ends, MD

## 2019-12-06 NOTE — Anesthesia Postprocedure Evaluation (Signed)
Anesthesia Post Note  Patient: Cindy Trujillo  Procedure(s) Performed: XI ROBOTIC ASSISTED TOTAL HYSTERECTOMY WITH BILATERAL SALPINGO OOPHORECTOMY (N/A ) SENTINEL NODE BIOPSY (N/A ) INDOCYANINE GREEN FLUORESCENCE IMAGING (ICG)  Patient location during evaluation: PACU Anesthesia Type: General Level of consciousness: awake and alert Pain management: pain level controlled Vital Signs Assessment: post-procedure vital signs reviewed and stable Respiratory status: spontaneous breathing and respiratory function stable Cardiovascular status: stable Anesthetic complications: no   No complications documented.   Last Vitals:  Vitals:   12/06/19 1647 12/06/19 1656  BP: (!) 89/58 107/60  Pulse: 64 66  Resp: 15 14  Temp:    SpO2: 94% 97%    Last Pain:  Vitals:   12/06/19 1656  TempSrc:   PainSc: Asleep                 Denine Brotz K

## 2019-12-06 NOTE — Anesthesia Preprocedure Evaluation (Signed)
Anesthesia Evaluation  Patient identified by MRN, date of birth, ID band Patient awake    Reviewed: Allergy & Precautions, H&P , NPO status , Patient's Chart, lab work & pertinent test results, reviewed documented beta blocker date and time   Airway Mallampati: II   Neck ROM: full    Dental  (+) Poor Dentition   Pulmonary neg pulmonary ROS,    Pulmonary exam normal        Cardiovascular Exercise Tolerance: Good negative cardio ROS Normal cardiovascular exam Rhythm:regular Rate:Normal     Neuro/Psych negative neurological ROS  negative psych ROS   GI/Hepatic Neg liver ROS, GERD  Medicated,  Endo/Other  diabetes, Type 2, Oral Hypoglycemic AgentsMorbid obesity  Renal/GU Renal disease  negative genitourinary   Musculoskeletal   Abdominal   Peds  Hematology negative hematology ROS (+)   Anesthesia Other Findings Past Medical History: No date: Diabetes mellitus without complication ([place]) 91/4782: DVT (deep venous thrombosis) ([facility])     Comment:  right leg No date: GERD (gastroesophageal reflux disease) No date: Hyperlipidemia Past Surgical History: 08/23/2017: COLONOSCOPY WITH PROPOFOL; N/A     Comment:  Procedure: COLONOSCOPY WITH PROPOFOL;  Surgeon: Manya Silvas, MD;  Location: [facility] ENDOSCOPY;  Service:               Endoscopy;  Laterality: N/A; 10/31/2019: HYSTEROSCOPY WITH D & C; N/A     Comment:  Procedure: DILATATION AND CURETTAGE /HYSTEROSCOPY WITH               MYOSURE RESECTION;  Surgeon: Homero Fellers, MD;                Location: [facility] ORS;  Service: Gynecology;  Laterality:               N/A; No date: TONSILLECTOMY 1968: TONSILLECTOMY AND ADENOIDECTOMY 05/2019: VEIN SURGERY     Comment:  laser BMI    Body Mass Index: 39.82 kg/m     Reproductive/Obstetrics negative OB ROS                             Anesthesia Physical Anesthesia Plan  ASA:  III  Anesthesia Plan: General   Post-op Pain Management:    Induction:   PONV Risk Score and Plan: 4 or greater  Airway Management Planned:   Additional Equipment:   Intra-op Plan:   Post-operative Plan:   Informed Consent: I have reviewed the patients History and Physical, chart, labs and discussed the procedure including the risks, benefits and alternatives for the proposed anesthesia with the patient or authorized representative who has indicated his/her understanding and acceptance.     Dental Advisory Given  Plan Discussed with: CRNA  Anesthesia Plan Comments:         Anesthesia Quick Evaluation

## 2019-12-06 NOTE — Anesthesia Procedure Notes (Signed)
Procedure Name: Intubation Date/Time: 12/06/2019 11:37 AM Performed by: Genevie Ann, CRNA Pre-anesthesia Checklist: Patient identified, Emergency Drugs available, Suction available, Patient being monitored and Timeout performed Patient Re-evaluated:Patient Re-evaluated prior to induction Oxygen Delivery Method: Circle system utilized Preoxygenation: Pre-oxygenation with 100% oxygen Induction Type: IV induction Ventilation: Mask ventilation without difficulty Laryngoscope Size: McGraph and 4 Grade View: Grade I Tube type: Oral Tube size: 7.0 mm Number of attempts: 1 Airway Equipment and Method: Stylet Placement Confirmation: ETT inserted through vocal cords under direct vision,  positive ETCO2 and breath sounds checked- equal and bilateral Secured at: 22 cm Dental Injury: Teeth and Oropharynx as per pre-operative assessment

## 2019-12-06 NOTE — Interval H&P Note (Signed)
History and Physical Interval Note:  12/06/2019 10:39 AM  Cindy Trujillo  has presented today for surgery, with the diagnosis of Endometrial Cancer.  The various methods of treatment have been discussed with the patient and family. After consideration of risks, benefits and other options for treatment, the patient has consented to  Procedure(s): XI ROBOTIC ASSISTED TOTAL HYSTERECTOMY WITH BILATERAL SALPINGO OOPHORECTOMY (N/A) SENTINEL NODE BIOPSY (N/A) as a surgical intervention.  The patient's history has been reviewed, patient examined, no change in status, stable for surgery.  I have reviewed the patient's chart and labs.  Questions were answered to the patient's satisfaction.     [place]

## 2019-12-06 NOTE — Interval H&P Note (Signed)
History and Physical Interval Note:  12/06/2019 11:12 AM  Cindy Trujillo  has presented today for surgery, with the diagnosis of Endometrial Cancer.  The various methods of treatment have been discussed with the patient and family. After consideration of risks, benefits and other options for treatment, the patient has consented to  Procedure(s): XI ROBOTIC ASSISTED TOTAL HYSTERECTOMY WITH BILATERAL SALPINGO OOPHORECTOMY (N/A) SENTINEL NODE BIOPSY (N/A) as a surgical intervention.  The patient's history has been reviewed, patient examined, no change in status, stable for surgery.  I have reviewed the patient's chart and labs.  Questions were answered to the patient's satisfaction.     [place]

## 2019-12-07 ENCOUNTER — Encounter: Payer: Self-pay | Admitting: Obstetrics and Gynecology

## 2019-12-07 DIAGNOSIS — Z86718 Personal history of other venous thrombosis and embolism: Secondary | ICD-10-CM | POA: Diagnosis not present

## 2019-12-07 DIAGNOSIS — Z01812 Encounter for preprocedural laboratory examination: Secondary | ICD-10-CM | POA: Diagnosis not present

## 2019-12-07 DIAGNOSIS — I8393 Asymptomatic varicose veins of bilateral lower extremities: Secondary | ICD-10-CM | POA: Diagnosis present

## 2019-12-07 DIAGNOSIS — Z7901 Long term (current) use of anticoagulants: Secondary | ICD-10-CM | POA: Diagnosis not present

## 2019-12-07 DIAGNOSIS — Z6839 Body mass index (BMI) 39.0-39.9, adult: Secondary | ICD-10-CM | POA: Diagnosis not present

## 2019-12-07 DIAGNOSIS — C541 Malignant neoplasm of endometrium: Secondary | ICD-10-CM | POA: Diagnosis present

## 2019-12-07 DIAGNOSIS — E119 Type 2 diabetes mellitus without complications: Secondary | ICD-10-CM | POA: Diagnosis present

## 2019-12-07 DIAGNOSIS — Z90710 Acquired absence of both cervix and uterus: Secondary | ICD-10-CM | POA: Diagnosis present

## 2019-12-07 DIAGNOSIS — D696 Thrombocytopenia, unspecified: Secondary | ICD-10-CM | POA: Diagnosis present

## 2019-12-07 DIAGNOSIS — Z20822 Contact with and (suspected) exposure to covid-19: Secondary | ICD-10-CM | POA: Diagnosis present

## 2019-12-07 DIAGNOSIS — K219 Gastro-esophageal reflux disease without esophagitis: Secondary | ICD-10-CM | POA: Diagnosis present

## 2019-12-07 DIAGNOSIS — N950 Postmenopausal bleeding: Secondary | ICD-10-CM | POA: Diagnosis present

## 2019-12-07 DIAGNOSIS — Z7984 Long term (current) use of oral hypoglycemic drugs: Secondary | ICD-10-CM | POA: Diagnosis not present

## 2019-12-07 LAB — GLUCOSE, CAPILLARY
Glucose-Capillary: 174 mg/dL — ABNORMAL HIGH (ref 70–99)
Glucose-Capillary: 147 mg/dL — ABNORMAL HIGH (ref 70–99)
Glucose-Capillary: 163 mg/dL — ABNORMAL HIGH (ref 70–99)
Glucose-Capillary: 133 mg/dL — ABNORMAL HIGH (ref 70–99)
Glucose-Capillary: 131 mg/dL — ABNORMAL HIGH (ref 70–99)
Glucose-Capillary: 165 mg/dL — ABNORMAL HIGH (ref 70–99)

## 2019-12-07 LAB — CBC
HCT: 30.3 % — ABNORMAL LOW (ref 36.0–46.0)
Hemoglobin: 10.2 g/dL — ABNORMAL LOW (ref 12.0–15.0)
MCH: 27.1 pg (ref 26.0–34.0)
MCHC: 33.7 g/dL (ref 30.0–36.0)
MCV: 80.6 fL (ref 80.0–100.0)
Platelets: 79 10*3/uL — ABNORMAL LOW (ref 150–400)
RBC: 3.76 MIL/uL — ABNORMAL LOW (ref 3.87–5.11)
RDW: 14.5 % (ref 11.5–15.5)
WBC: 4.6 10*3/uL (ref 4.0–10.5)
nRBC: 0 % (ref 0.0–0.2)

## 2019-12-07 MED ORDER — ENOXAPARIN SODIUM 40 MG/0.4ML ~~LOC~~ SOLN: 40.0000 mg | SUBCUTANEOUS | Status: DC

## 2019-12-07 MED ORDER — ENOXAPARIN SODIUM 40 MG/0.4ML ~~LOC~~ SOLN
40.0000 mg | SUBCUTANEOUS | Status: DC
  Administered 2019-12-07: 40 mg via SUBCUTANEOUS
  Filled 2019-12-07: qty 0.4

## 2019-12-07 MED ORDER — INSULIN DETEMIR 100 UNIT/ML ~~LOC~~ SOLN
5.0000 [IU] | Freq: Two times a day (BID) | SUBCUTANEOUS | Status: DC
  Administered 2019-12-07 (×2): 5 [IU] via SUBCUTANEOUS
  Filled 2019-12-07 (×3): qty 0.05

## 2019-12-07 NOTE — Evaluation (Signed)
Physical Therapy Evaluation Patient Details Name: Cindy Trujillo MRN: [id number] DOB: [birth year] Today's Date: 12/07/2019   History of Present Illness  Pt is a 64 y.o. female s/p total hysterectomy and salpingo-oophorectomy 8/25 (pt with post-menopausal bleeding; Grade 1 endometrial CA).  PMH includes DM and DVT R LE.  Clinical Impression  Prior to hospital admission, pt was independent with ambulation; lives with her husband in 1 level home with 5 STE L railing.  Currently pt is min assist with bed mobility via logrolling; CGA with transfers; and CGA to ambulate 110 feet with single UE support on IV pole (other UE holding pillow on abdomen for support).  Pt stopped and stood a few times briefly during ambulation holding pillow against abdomen for support/comfort.  Pt's abdominal pain 5/10 begining and end of session at rest.  Pt educated on logrolling technique with bed mobility, transverse abdominal contraction with transfers, and gait technique with ambulation (pt and pt's husband appearing with good understanding).  Pt would benefit from skilled PT to address noted impairments and functional limitations (see below for any additional details).  Upon hospital discharge, anticipate no further PT needs.  Per discussion with pt's nurse, plan for pt to walk again later with nursing assist.    Follow Up Recommendations No PT follow up    Equipment Recommendations  None recommended by PT    Recommendations for Other Services       Precautions / Restrictions Precautions Precautions: Fall Restrictions Weight Bearing Restrictions: No      Mobility  Bed Mobility Overal bed mobility: Needs Assistance Bed Mobility: Rolling;Sidelying to Sit;Sit to Sidelying Rolling: Min assist (assist for initial logrolling to R side) Sidelying to sit: Min assist (min assist for trunk)     Sit to sidelying: Min assist (assist for LE's) General bed mobility comments: vc's for logrolling technique in/out of  bed  Transfers Overall transfer level: Needs assistance Equipment used: None Transfers: Sit to/from Stand Sit to Stand: Min guard         General transfer comment: x2 trials standing from bed; vc's for transverse abdominal contraction to stabilize core during sit to/from stand; mild increased effort to stand and control descent sitting  Ambulation/Gait Ambulation/Gait assistance: Min guard Gait Distance (Feet): 110 Feet Assistive device: IV Pole   Gait velocity: decreased   General Gait Details: decreased B LE step length (vc's to increase step length); x3 brief standing rest breaks (pt holding pillow with B UE's against abdomen for comfort); more cautious ambulation; steady with single UE support on IV pole  Stairs            Wheelchair Mobility    Modified Rankin (Stroke Patients Only)       Balance Overall balance assessment: Needs assistance Sitting-balance support: No upper extremity supported;Feet supported Sitting balance-Leahy Scale: Good Sitting balance - Comments: steady sitting reaching outside BOS   Standing balance support: No upper extremity supported Standing balance-Leahy Scale: Good Standing balance comment: steady standing brushing teeth at sink                             Pertinent Vitals/Pain Pain Assessment: 0-10 Pain Score: 5  Pain Location: abdominal incisions Pain Descriptors / Indicators: Sore Pain Intervention(s): Limited activity within patient's tolerance;Monitored during session;Premedicated before session;Repositioned  Vitals (HR and O2 on room air) stable and WFL throughout treatment session.    Home Living Family/patient expects to be discharged to:: Private residence Living  Arrangements: Spouse/significant other Available Help at Discharge: Family Type of Home: House Home Access: Stairs to enter Entrance Stairs-Rails: Left Entrance Stairs-Number of Steps: 5 Home Layout: One level Home Equipment: Grab bars -  tub/shower;Shower seat - built in Additional Comments: Lift chair    Prior Function Level of Independence: Independent               Hand Dominance        Extremity/Trunk Assessment   Upper Extremity Assessment Upper Extremity Assessment: Overall WFL for tasks assessed    Lower Extremity Assessment Lower Extremity Assessment: Generalized weakness    Cervical / Trunk Assessment Cervical / Trunk Assessment: Normal  Communication   Communication: No difficulties  Cognition Arousal/Alertness: Awake/alert Behavior During Therapy: WFL for tasks assessed/performed Overall Cognitive Status: Within Functional Limits for tasks assessed                                        General Comments General comments (skin integrity, edema, etc.): 5 small dressings in place on abdomen.  Nursing cleared pt for participation in physical therapy and nurse reports pt does not need abdominal binder.  Pt agreeable to PT session.  Pt's husband present during session and appearing supportive.     Exercises  Bed mobility, transfers, and ambulation   Assessment/Plan    PT Assessment Patient needs continued PT services  PT Problem List Decreased strength;Decreased balance;Decreased activity tolerance;Decreased mobility;Decreased knowledge of use of DME;Decreased knowledge of precautions;Pain;Decreased skin integrity       PT Treatment Interventions DME instruction;Gait training;Functional mobility training;Therapeutic activities;Therapeutic exercise;Balance training;Stair training;Patient/family education    PT Goals (Current goals can be found in the Care Plan section)  Acute Rehab PT Goals Patient Stated Goal: to improve walking PT Goal Formulation: With patient Time For Goal Achievement: 12/21/19 Potential to Achieve Goals: Good    Frequency Min 2X/week   Barriers to discharge        Co-evaluation               AM-PAC PT "6 Clicks" Mobility  Outcome  Measure Help needed turning from your back to your side while in a flat bed without using bedrails?: A Little Help needed moving from lying on your back to sitting on the side of a flat bed without using bedrails?: A Little Help needed moving to and from a bed to a chair (including a wheelchair)?: A Little Help needed standing up from a chair using your arms (e.g., wheelchair or bedside chair)?: A Little Help needed to walk in hospital room?: A Little Help needed climbing 3-5 steps with a railing? : A Little 6 Click Score: 18    End of Session Equipment Utilized During Treatment: Gait belt (gait belt up high away from surgical sites) Activity Tolerance: No increased pain;Patient tolerated treatment well Patient left: in bed;with call bell/phone within reach;with family/visitor present Nurse Communication: Mobility status;Precautions;Other (comment) (pt's pain status; bed alarm left off per discussion with pt's nurse) PT Visit Diagnosis: Other abnormalities of gait and mobility (R26.89);Muscle weakness (generalized) (M62.81);Difficulty in walking, not elsewhere classified (R26.2)    Time: 8657-8469 PT Time Calculation (min) (ACUTE ONLY): 42 min   Charges:   PT Evaluation $PT Eval Low Complexity: 1 Low PT Treatments $Gait Training: 8-22 mins $Therapeutic Activity: 8-22 mins       Leitha Bleak, PT 12/07/19, 4:14 PM

## 2019-12-07 NOTE — Progress Notes (Signed)
Pt able to sit and stand on side of bed x6 and ambulate near bed without use of walker. Walker provided to pt in room if needed.   Husband at bedside and very supportive.   Pt ate a clear liquid tray for breakfast, a carb-modified tray for lunch (including green beans, carrots, and mashed potatoes), and then requested graham crackers and apple juice for a snack around 1350.   Pt sat up on the side of the bed and ate her lunch (for around 2 hours).   After checking blood sugar and giving Novalog at 1415, pt requested to rest and then plans to walk again later/remove foley catheter.   Pain controlled with Tylenol (every 6 hours) and Roxicodone (87m) every 4 hours.   Pt states she is passing "a little" gas and is belching.   Port sites (x5) are clean, dry, and intact with no drainage.

## 2019-12-07 NOTE — Progress Notes (Signed)
[name], female   DOB: 06/20/1955, 64 y.o.   MRN: [id number]  Subjective:  She has not ambulated or sat up yet. She has had sips of water anc chicken broths. She is not passing flatus. Foley in place.  Pain control is adequate.   Objective:   Blood pressure (!) 98/53, pulse 61, temperature 98.5 F (36.9 C), temperature source Oral, resp. rate 16, height 5' 4"  (1.626 m), weight 105.2 kg, SpO2 97 %.  General: NAD Pulmonary: no increased work of breathing Abdomen: non-distended, non-tender, minimal bowel sounds Incisions: clean, dry, intact Extremities: no edema, no erythema, no tenderness, no signs of DVT  Results for orders placed or performed during the hospital encounter of 12/06/19 (from the past 72 hour(s))  Glucose, capillary     Status: Abnormal   Collection Time: 12/06/19  9:25 AM  Result Value Ref Range   Glucose-Capillary 142 (H) 70 - 99 mg/dL    Comment: Glucose reference range applies only to samples taken after fasting for at least 8 hours.  Glucose, capillary     Status: Abnormal   Collection Time: 12/06/19  3:53 PM  Result Value Ref Range   Glucose-Capillary 142 (H) 70 - 99 mg/dL    Comment: Glucose reference range applies only to samples taken after fasting for at least 8 hours.  Comprehensive metabolic panel     Status: Abnormal   Collection Time: 12/06/19  6:02 PM  Result Value Ref Range   Sodium 137 135 - 145 mmol/L   Potassium 4.5 3.5 - 5.1 mmol/L   Chloride 107 98 - 111 mmol/L   CO2 21 (L) 22 - 32 mmol/L   Glucose, Bld 170 (H) 70 - 99 mg/dL    Comment: Glucose reference range applies only to samples taken after fasting for at least 8 hours.   BUN 13 8 - 23 mg/dL   Creatinine, Ser 1.19 (H) 0.44 - 1.00 mg/dL   Calcium 8.4 (L) 8.9 - 10.3 mg/dL   Total Protein 6.3 (L) 6.5 - 8.1 g/dL   Albumin 3.5 3.5 - 5.0 g/dL   AST 44 (H) 15 - 41 U/L   ALT 25 0 - 44 U/L   Alkaline Phosphatase 68 38 - 126 U/L   Total Bilirubin 1.5 (H) 0.3 - 1.2 mg/dL    GFR calc non Af Amer 49 (L) >60 mL/min   GFR calc Af Amer 57 (L) >60 mL/min   Anion gap 9 5 - 15    Comment: Performed at [facility], [address] [place], [place] [address]  CBC     Status: Abnormal   Collection Time: 12/06/19  6:02 PM  Result Value Ref Range   WBC 5.3 4.0 - 10.5 K/uL   RBC 4.01 3.87 - 5.11 MIL/uL   Hemoglobin 10.6 (L) 12.0 - 15.0 g/dL   HCT 33.7 (L) 36 - 46 %   MCV 84.0 80.0 - 100.0 fL   MCH 26.4 26.0 - 34.0 pg   MCHC 31.5 30.0 - 36.0 g/dL   RDW 14.7 11.5 - 15.5 %   Platelets 78 (L) 150 - 400 K/uL    Comment: Immature Platelet Fraction may be clinically indicated, consider ordering this additional test [id number]    nRBC 0.0 0.0 - 0.2 %    Comment: Performed at [facility], [address] [place], [place] [address]  Glucose, capillary     Status: Abnormal   Collection Time: 12/06/19  6:15 PM  Result Value  Ref Range   Glucose-Capillary 164 (H) 70 - 99 mg/dL    Comment: Glucose reference range applies only to samples taken after fasting for at least 8 hours.  Glucose, capillary     Status: Abnormal   Collection Time: 12/06/19 10:24 PM  Result Value Ref Range   Glucose-Capillary 181 (H) 70 - 99 mg/dL    Comment: Glucose reference range applies only to samples taken after fasting for at least 8 hours.  Glucose, capillary     Status: Abnormal   Collection Time: 12/07/19  2:13 AM  Result Value Ref Range   Glucose-Capillary 174 (H) 70 - 99 mg/dL    Comment: Glucose reference range applies only to samples taken after fasting for at least 8 hours.  CBC     Status: Abnormal   Collection Time: 12/07/19  6:07 AM  Result Value Ref Range   WBC 4.6 4.0 - 10.5 K/uL   RBC 3.76 (L) 3.87 - 5.11 MIL/uL   Hemoglobin 10.2 (L) 12.0 - 15.0 g/dL   HCT 30.3 (L) 36 - 46 %   MCV 80.6 80.0 - 100.0 fL   MCH 27.1 26.0 - 34.0 pg   MCHC 33.7 30.0 - 36.0 g/dL   RDW 14.5 11.5 - 15.5 %   Platelets 79 (L) 150 - 400 K/uL    Comment: Immature Platelet Fraction  may be clinically indicated, consider ordering this additional test [id number]    nRBC 0.0 0.0 - 0.2 %    Comment: Performed at [facility], [address] [place], [place] [address]  Glucose, capillary     Status: Abnormal   Collection Time: 12/07/19  6:16 AM  Result Value Ref Range   Glucose-Capillary 147 (H) 70 - 99 mg/dL    Comment: Glucose reference range applies only to samples taken after fasting for at least 8 hours.    Assessment:   64 y.o. s/p robotic hysterectomy, BSO, sentinel lymph node dissections, POD#1  Plan:    1) Anemia - hemodynamically stable and asymptomatic- no evidence of bleeding  2) Encouraged ambulation- will consult PT to assist with ambulation  3) Advance diet as tolerated  4) SCDs in place- will start Lovenox this evening.   5) Will maintain foley until she is ambulating. Bladder dissection extensive during surgery. Will need bladder scan after void to ensure bladder is emptying well.   6) Disposition- continue care  Adrian Prows MD, [place], [facility] 12/07/2019 10:07 AM

## 2019-12-08 ENCOUNTER — Other Ambulatory Visit: Payer: Self-pay | Admitting: Obstetrics and Gynecology

## 2019-12-08 LAB — GLUCOSE, CAPILLARY
Glucose-Capillary: 158 mg/dL — ABNORMAL HIGH (ref 70–99)
Glucose-Capillary: 161 mg/dL — ABNORMAL HIGH (ref 70–99)
Glucose-Capillary: 138 mg/dL — ABNORMAL HIGH (ref 70–99)

## 2019-12-08 LAB — CYTOLOGY - NON PAP

## 2019-12-08 MED ORDER — OXYCODONE HCL 5 MG PO TABS: 5.0000 mg | ORAL_TABLET | ORAL | 0 refills | Status: DC | PRN

## 2019-12-08 MED ORDER — METOCLOPRAMIDE HCL 5 MG PO TABS
5.0000 mg | ORAL_TABLET | Freq: Three times a day (TID) | ORAL | Status: DC
  Administered 2019-12-08: 5 mg via ORAL
  Filled 2019-12-08 (×3): qty 1

## 2019-12-08 MED ORDER — INSULIN DETEMIR 100 UNIT/ML ~~LOC~~ SOLN
10.0000 [IU] | Freq: Two times a day (BID) | SUBCUTANEOUS | Status: DC
  Administered 2019-12-08: 10 [IU] via SUBCUTANEOUS
  Filled 2019-12-08 (×2): qty 0.1

## 2019-12-08 MED ORDER — APIXABAN 5 MG PO TABS: 5.0000 mg | ORAL_TABLET | Freq: Two times a day (BID) | ORAL | 0 refills | Status: DC

## 2019-12-08 MED ORDER — FREESTYLE LITE TEST VI STRP: 1.0000 | ORAL_STRIP | Freq: Four times a day (QID) | 3 refills | Status: DC

## 2019-12-08 MED ORDER — METOCLOPRAMIDE HCL 5 MG PO TABS
5.0000 mg | ORAL_TABLET | Freq: Three times a day (TID) | ORAL | 1 refills | Status: DC
Start: 2019-12-08 — End: 2020-03-27

## 2019-12-08 MED ORDER — SIMETHICONE 80 MG PO CHEW
80.0000 mg | CHEWABLE_TABLET | Freq: Four times a day (QID) | ORAL | 1 refills | Status: DC
Start: 2019-12-08 — End: 2020-03-27

## 2019-12-08 NOTE — Progress Notes (Signed)
Patient has been walking independently this morning to the bathroom and walked half way around nurses station with husband. Minimal assistance needed with shower. PT updated. Pain controlled by PO pain medications. Tolerating carb-modified diet and CBG 138 this morning, continues to pass gas.

## 2019-12-08 NOTE — Discharge Summary (Signed)
Physician Discharge Summary  [name] MRN: [id number] DOB/AGE: 07-04-1955 64 y.o.  Admit date: 12/06/2019 Discharge date: 12/08/2019  Admission Diagnoses: Endometrial Cancer S/p hysterectomy with oophorectomy and sentinel lymph node biopsy  Discharge Diagnoses:  Active Problems:   Endometrial cancer ([place])   S/P hysterectomy with oophorectomy   Discharged Condition: good  Hospital Course: [name].  On the day of discharge she is able to ambulate, urinate on her own, and was passing flatus.  She also tolerated a oral diet.  During her stay her blood glucose was controlled with Levemir and NovoLog.  At home she will resume her oral hypoglycemic medications.  She will notify us if she has sugars greater than 180.  She will continue Eliquis 5 mg twice a day when she returns home.  She received Lovenox for anticoagulation while she was in the hospital.  She will follow-up in the office in 1 week.  She is discharged home in stable condition.  Consults: None  Significant Diagnostic Studies: labs: See epic  Treatments: IV hydration, anticoagulation: LMW heparin and surgery: robotic hysterectomy, bilateral salpingo-oophorectomy and sentinel lymph node biopsy  Discharge Exam: Blood pressure (!) 105/54, pulse 65, temperature 98 F (36.7 C), temperature source Oral, resp. rate 16, height 5' 4"  (1.626 m), weight 105.2 kg, SpO2 94 %. General appearance: alert and cooperative Resp: clear to auscultation bilaterally Chest wall: no tenderness Cardio: regular rate and rhythm, S1, S2 normal, no murmur, click, rub or gallop GI: soft, non-tender; bowel sounds normal; no masses,  no organomegaly Skin: Skin color, texture, turgor normal. No rashes or lesions Incision/Wound: clean Dry, intact  Disposition: Discharge disposition: 01-Home or Self Care       Discharge Instructions    Call MD for:  difficulty breathing, headache or  visual disturbances   Complete by: As directed    Call MD for:  extreme fatigue   Complete by: As directed    Call MD for:  hives   Complete by: As directed    Call MD for:  persistant dizziness or light-headedness   Complete by: As directed    Call MD for:  persistant nausea and vomiting   Complete by: As directed    Call MD for:  redness, tenderness, or signs of infection (pain, swelling, redness, odor or green/yellow discharge around incision site)   Complete by: As directed    Call MD for:  severe uncontrolled pain   Complete by: As directed    Call MD for:  temperature >100.4   Complete by: As directed    Diet Carb Modified   Complete by: As directed    Driving Restrictions   Complete by: As directed    Do not drive while taking narcotic medications   Increase activity slowly   Complete by: As directed    May shower / Bathe   Complete by: As directed      Allergies as of 12/08/2019      Reactions   Sulfa Antibiotics Hives      Medication List    TAKE these medications   acetaminophen 500 MG tablet Commonly known as: TYLENOL Take 500 mg by mouth every 6 (six) hours as needed (for pain.).   apixaban 5 MG Tabs tablet Commonly known as: ELIQUIS Take 1 tablet (5 mg total) by mouth 2 (two) times daily. What changed: how much to take   dapagliflozin propanediol 10 MG Tabs tablet Commonly known as: FARXIGA Take 10  mg by mouth daily. What changed: when to take this   ferrous sulfate 325 (65 FE) MG tablet Take 325 mg by mouth 2 (two) times a week.   FREESTYLE TEST STRIPS test strip Generic drug: glucose blood Use as instructed to check blood glucose daily What changed: Another medication with the same name was changed. Make sure you understand how and when to take each.   glucose blood test strip What changed: Another medication with the same name was changed. Make sure you understand how and when to take each.   FREESTYLE LITE test strip Generic drug: glucose  blood 1 each by Other route in the morning, at noon, in the evening, and at bedtime. What changed:   how much to take  how to take this  when to take this   FreeStyle Unistick II Lancets Misc Use as instructed to check blood glucose daily   freestyle lancets   glucose monitoring kit monitoring kit 1 each by Does not apply route as needed for other.   FreeStyle Lite Devi   lisinopril 5 MG tablet Commonly known as: ZESTRIL Take 1 tablet (5 mg total) by mouth daily. What changed: when to take this   loratadine 10 MG tablet Commonly known as: CLARITIN Take 1 tablet (10 mg total) by mouth daily.   metFORMIN 1000 MG tablet Commonly known as: GLUCOPHAGE Take 1 tablet (1,000 mg total) by mouth 2 (two) times daily with a meal.   metoCLOPramide 5 MG tablet Commonly known as: REGLAN Take 1 tablet (5 mg total) by mouth 4 (four) times daily -  before meals and at bedtime.   multivitamin with minerals Tabs tablet Take 1 tablet by mouth 2 (two) times a week.   omeprazole 20 MG capsule Commonly known as: PRILOSEC TAKE 1 CAPSULE (20 MG TOTAL) BY MOUTH DAILY.   oxyCODONE 5 MG immediate release tablet Commonly known as: Oxy IR/ROXICODONE Take 1-2 tablets (5-10 mg total) by mouth every 4 (four) hours as needed for moderate pain.   rosuvastatin 5 MG tablet Commonly known as: CRESTOR Take 1 tablet (5 mg total) by mouth daily. What changed: when to take this   simethicone 80 MG chewable tablet Commonly known as: MYLICON Chew 1 tablet (80 mg total) by mouth 4 (four) times daily.   vitamin B-12 1000 MCG tablet Commonly known as: CYANOCOBALAMIN Take 1 tablet (1,000 mcg total) by mouth daily. What changed: when to take this       Follow-up Information    Kalia Vahey, Stefanie Libel, MD Follow up.   Specialty: Obstetrics and Gynecology Contact information: [address] [place] [address] [phone]               Signed: Homero Fellers 12/08/2019, 12:10  PM

## 2019-12-08 NOTE — Progress Notes (Signed)
Patient discharged home with spouse. Discharge instructions and prescriptions given and reviewed with patient. Patient verbalized understanding. Escorted out by auxillary.

## 2019-12-11 ENCOUNTER — Encounter: Payer: Self-pay | Admitting: *Deleted

## 2019-12-11 ENCOUNTER — Telehealth: Payer: Self-pay

## 2019-12-11 ENCOUNTER — Other Ambulatory Visit: Payer: Self-pay | Admitting: *Deleted

## 2019-12-11 NOTE — Telephone Encounter (Signed)
FMLA/DISABILITY form for [employer] filled out, signature obtained and given to [facility] for processing.

## 2019-12-11 NOTE — Telephone Encounter (Signed)
Done. Pt was made aware of the MD ONLY appt sched for 9/13

## 2019-12-11 NOTE — Telephone Encounter (Signed)
-----   Message from Earlie Server, MD sent at 12/09/2019  9:43 AM EDT ----- She has gyn surgery done. Please schedule her to see me in 2 weeks. Thanks. MD only

## 2019-12-11 NOTE — Patient Outreach (Signed)
[facility] Care Management  12/11/2019  Cindy Trujillo [birth year] [id number]   Transition of care call/case closure   Referral received:12/05/19 Initial outreach:12/11/19 Insurance: [employer]    Subjective: Initial successful telephone call to patient's preferred number in order to complete transition of care assessment; 2 HIPAA identifiers verified. Explained purpose of call and completed transition of care assessment.  Cindy Trujillo states that she is doing okay,she discussed having pain at one point left incision area. She reports getting relief with prn pain medications.  denies post-operative problem. She reports having dressing at one incision point, site without redness drainage, intact with glue. She reports tolerating diet, with having nausea or vomiting, she report having bowel movement .  She is agreeable receiving additional information on Diabetes and diet.  She report tolerating mobility in home, states having lift chair makes getting up a little easier, she is using a pillow to splint incision area when getting up and using roll method with bed mobility. Patient spouse is available to assist in her  recovery.  She discussed  any ongoing health issues and she is enrolled in Active health management chronic condition program. Patient has the freestyle meter and reports blood sugar readings has been 109- 116 before meals .   She does not have the hospital indemnity She  uses a [facility] outpatient pharmacy at [place].  She  denies educational needs related to staying safe during the COVID 19 pandemic.    Objective:   Cindy Trujillo  was hospitalized at [facility] from 8/24-8/27/21 for Robotic assisted total hysterectomy , bilateral salping oopherctomy  Comorbidities include: Endometrial cancer, Type 2 Diabetes, DVT, Hyperlipidemia.  She  was discharged to home on 12/08/19 without the need for home health services or DME.   Assessment:   Patient voices good understanding of all discharge instructions.  See transition of care flowsheet for assessment details.   Plan:  Reviewed hospital discharge diagnosis of Robotic assisted total hysterectomy bilateral salping oopherectomy   and discharge treatment plan using hospital discharge instructions, assessing medication adherence, reviewing problems requiring provider notification, and discussing the importance of follow up with surgeon, primary care provider and/or specialists as directed.  Reviewed [place] healthy lifestyle program information to receive discounted premium for  2022   Step 1: Get  your annual physical  Step 2: Complete your health assessment  Step 3:Identify your current health status and complete the corresponding action step between January 1, and December 13, 2019. She has  Completed.   Using [place] website, verified that patient is an active participate in [place]'s Active Health Management chronic disease management program.    No ongoing care management needs identified so will close case to [place] Management services and route successful outreach letter with [place] Management pamphlet and 24 Hour Nurse Line Magnet to [place] Management clinical pool to be mailed to patient's home address. Will send EMMI handout on Diabetes and Diet .  Thanked patient for their services to [facility].  Joylene Draft, RN, BSN  [place] Management Coordinator  [phone]- Mobile [phone]- Toll Free Main Office

## 2019-12-11 NOTE — Telephone Encounter (Signed)
Please schedule and inform patient of appt details.

## 2019-12-15 ENCOUNTER — Ambulatory Visit (INDEPENDENT_AMBULATORY_CARE_PROVIDER_SITE_OTHER): Payer: 59 | Admitting: Obstetrics and Gynecology

## 2019-12-15 ENCOUNTER — Other Ambulatory Visit: Payer: Self-pay

## 2019-12-15 ENCOUNTER — Encounter: Payer: Self-pay | Admitting: Obstetrics and Gynecology

## 2019-12-15 VITALS — BP 126/84 | Ht 64.0 in | Wt 232.0 lb

## 2019-12-15 DIAGNOSIS — Z90721 Acquired absence of ovaries, unilateral: Secondary | ICD-10-CM

## 2019-12-15 DIAGNOSIS — E1100 Type 2 diabetes mellitus with hyperosmolarity without nonketotic hyperglycemic-hyperosmolar coma (NKHHC): Secondary | ICD-10-CM

## 2019-12-15 DIAGNOSIS — C541 Malignant neoplasm of endometrium: Secondary | ICD-10-CM

## 2019-12-15 DIAGNOSIS — T8149XA Infection following a procedure, other surgical site, initial encounter: Secondary | ICD-10-CM | POA: Diagnosis not present

## 2019-12-15 DIAGNOSIS — Z90710 Acquired absence of both cervix and uterus: Secondary | ICD-10-CM

## 2019-12-15 MED ORDER — CLINDAMYCIN HCL 300 MG PO CAPS: 300.0000 mg | ORAL_CAPSULE | Freq: Three times a day (TID) | ORAL | 0 refills | Status: AC

## 2019-12-15 NOTE — Addendum Note (Signed)
Addended by: Adrian Prows on: 12/15/2019 02:21 PM   Modules accepted: Orders

## 2019-12-15 NOTE — Patient Instructions (Addendum)
Wound Infection A wound infection happens when tiny organisms (microorganisms) start to grow in a wound. A wound infection is most often caused by bacteria. Infection can cause the wound to break open or worsen. Wound infection needs treatment. If a wound infection is left untreated, complications can occur. Untreated wound infections may lead to an infection in the bloodstream (septicemia) or a bone infection (osteomyelitis). What are the causes? This condition is most often caused by bacteria growing in a wound. Other microorganisms, like yeast and fungi, can also cause wound infections. What increases the risk? The following factors may make you more likely to develop this condition:  Having a weak body defense system (immune system).  Having diabetes.  Taking steroid medicines for a long time (chronic use).  Smoking.  Being an older person.  Being overweight.  Taking chemotherapy medicines. What are the signs or symptoms? Symptoms of this condition include:  Having more redness, swelling, or pain at the wound site.  Having more blood or fluid at the wound site.  A bad smell coming from a wound or bandage (dressing).  Having a fever.  Feeling tired or fatigued.  Having warmth at or around the wound.  Having pus at the wound site. How is this diagnosed? This condition is diagnosed with a medical history and physical exam. You may also have a wound culture or blood tests or both. How is this treated? This condition is usually treated with an antibiotic medicine.  The infection should improve 24-48 hours after you start antibiotics.  After 24-48 hours, redness around the wound should stop spreading, and the wound should be less painful. Follow these instructions at home: Medicines  Take or apply over-the-counter and prescription medicines only as told by your health care provider.  If you were prescribed an antibiotic medicine, take or apply it as told by your health  care provider. Do not stop using the antibiotic even if you start to feel better. Wound care   Clean the wound each day, or as told by your health care provider. ? Wash the wound with mild soap and water. ? Rinse the wound with water to remove all soap. ? Pat the wound dry with a clean towel. Do not rub it.  Follow instructions from your health care provider about how to take care of your wound. Make sure you: ? Wash your hands with soap and water before and after you change your dressing. If soap and water are not available, use hand sanitizer. ? Change your dressing as told by your health care provider. ? Leave stitches (sutures), skin glue, or adhesive strips in place if your wound has been closed. These skin closures may need to stay in place for 2 weeks or longer. If adhesive strip edges start to loosen and curl up, you may trim the loose edges. Do not remove adhesive strips completely unless your health care provider tells you to do that. Some wounds are left open to heal on their own.  Check your wound every day for signs of infection. Watch for: ? More redness, swelling, or pain. ? More fluid or blood. ? Warmth. ? Pus or a bad smell. General instructions  Keep the dressing dry until your health care provider says it can be removed.  Do not take baths, swim, or use a hot tub until your health care provider approves. Ask your health care provider if you may take showers. You may only be allowed to take sponge baths.  Raise (elevate)  the injured area above the level of your heart while you are sitting or lying down.  Do not scratch or pick at the wound.  Keep all follow-up visits as told by your health care provider. This is important. Contact a health care provider if:  Your pain is not controlled with medicine.  You have more redness, swelling, or pain around your wound.  You have more fluid or blood coming from your wound.  Your wound feels warm to the touch.  You have  pus coming from your wound.  You continue to notice a bad smell coming from your wound or your dressing.  Your wound that was closed breaks open. Get help right away if:  You have a red streak going away from your wound.  You have a fever. Summary  A wound infection happens when tiny organisms (microorganisms) start to grow in a wound.  This condition is usually treated with an antibiotic medicine.  Follow instructions from your health care provider about how to take care of your wound.  Contact a health care provider if your wound infection does not begin to improve in 24-48 hours, or your symptoms worsen.  Keep all follow-up visits as told by your health care provider. This is important. This information is not intended to replace advice given to you by your health care provider. Make sure you discuss any questions you have with your health care provider. Document Revised: 11/09/2017 Document Reviewed: 11/09/2017 Elsevier Patient Education  [place].

## 2019-12-15 NOTE — Progress Notes (Signed)
  Postoperative Follow-up Patient presents post op from robotic total hysterectomy with BSO and LND for endometrial cancer, 1 week ago.  Subjective: Patient reports marked improvement in her preop symptoms. Eating a regular diet without difficulty. Pain is controlled with current analgesics. Medications being used: narcotic analgesics including  .  Activity: sedentary. Patient reports additional symptom's since surgery of   Objective: BP 126/84   Ht 5' 4"  (1.626 m)   Wt 232 lb (105.2 kg)   BMI 39.82 kg/m  Physical Exam Constitutional:      Appearance: She is well-developed.  Genitourinary:     Vagina and uterus normal.     No lesions in the vagina.     No cervical motion tenderness.     No right or left adnexal mass present.  HENT:     Head: Normocephalic and atraumatic.  Neck:     Thyroid: No thyromegaly.  Cardiovascular:     Rate and Rhythm: Normal rate and regular rhythm.     Heart sounds: Normal heart sounds.  Pulmonary:     Effort: Pulmonary effort is normal.     Breath sounds: Normal breath sounds.  Chest:     Breasts:        Right: No inverted nipple, mass, nipple discharge or skin change.        Left: No inverted nipple, mass, nipple discharge or skin change.  Abdominal:     General: There is no distension.     Palpations: Abdomen is soft. There is no mass.    Musculoskeletal:     Cervical back: Neck supple.  Neurological:     Mental Status: She is alert and oriented to person, place, and time.  Skin:    General: Skin is warm and dry.  Psychiatric:        Behavior: Behavior normal.        Thought Content: Thought content normal.        Judgment: Judgment normal.  Vitals reviewed.     Assessment: s/p : robotic total hysterectomy with BSO and LND,  stable  Plan: Patient has done well after surgery with no apparent complications.  I have discussed the post-operative course to date, and the expected progress moving forward.  The patient understands what  complications to be concerned about.  I will see the patient in routine follow up, or sooner if needed.    Activity plan: No heavy lifting.   Pelvic rest.  Clean wound with soapy water 4 times a day Start oral clindamycin. Keep clean and dry Report any worsening in symptoms.  Follow up in 1 week.   Kaniesha Barile R Emanual Lamountain 12/15/2019, 1:59 PM

## 2019-12-20 NOTE — Op Note (Signed)
  Dr. Gardiner Rhyme assisted me with the procedure which could not have been performed without her assistance. This was a high-level case requiring a Insurance risk surveyor. Dr. Gardiner Rhyme performed the initial abdominal portion of the procedure, laparoscopic entry, placement of robotic ports and docking. She also provided high level assistance throughout the procedure that was needed for lymph node staging and uterine manipulation.  The patient's obesity extended the length and complexity of the procedure given difficulties with visualization and exposure.   Catilyn Boggus Gaetana Michaelis, MD

## 2019-12-21 LAB — WOUND CULTURE: Organism ID, Bacteria: NONE SEEN

## 2019-12-22 ENCOUNTER — Ambulatory Visit: Payer: 59 | Admitting: Family Medicine

## 2019-12-25 ENCOUNTER — Other Ambulatory Visit: Payer: Self-pay

## 2019-12-25 ENCOUNTER — Inpatient Hospital Stay: Payer: 59 | Attending: Oncology | Admitting: Oncology

## 2019-12-25 ENCOUNTER — Telehealth: Payer: Self-pay

## 2019-12-25 ENCOUNTER — Encounter: Payer: Self-pay | Admitting: Oncology

## 2019-12-25 ENCOUNTER — Inpatient Hospital Stay: Payer: 59

## 2019-12-25 VITALS — BP 126/54 | HR 69 | Temp 98.0°F | Resp 17 | Wt 227.0 lb

## 2019-12-25 DIAGNOSIS — E785 Hyperlipidemia, unspecified: Secondary | ICD-10-CM | POA: Diagnosis not present

## 2019-12-25 DIAGNOSIS — Z86718 Personal history of other venous thrombosis and embolism: Secondary | ICD-10-CM | POA: Diagnosis not present

## 2019-12-25 DIAGNOSIS — E119 Type 2 diabetes mellitus without complications: Secondary | ICD-10-CM | POA: Diagnosis not present

## 2019-12-25 DIAGNOSIS — Z7901 Long term (current) use of anticoagulants: Secondary | ICD-10-CM | POA: Diagnosis not present

## 2019-12-25 DIAGNOSIS — Z79899 Other long term (current) drug therapy: Secondary | ICD-10-CM | POA: Diagnosis not present

## 2019-12-25 DIAGNOSIS — Z7984 Long term (current) use of oral hypoglycemic drugs: Secondary | ICD-10-CM | POA: Insufficient documentation

## 2019-12-25 DIAGNOSIS — D696 Thrombocytopenia, unspecified: Secondary | ICD-10-CM | POA: Diagnosis not present

## 2019-12-25 DIAGNOSIS — K219 Gastro-esophageal reflux disease without esophagitis: Secondary | ICD-10-CM | POA: Insufficient documentation

## 2019-12-25 DIAGNOSIS — D6851 Activated protein C resistance: Secondary | ICD-10-CM | POA: Insufficient documentation

## 2019-12-25 DIAGNOSIS — C541 Malignant neoplasm of endometrium: Secondary | ICD-10-CM | POA: Insufficient documentation

## 2019-12-25 LAB — CBC WITH DIFFERENTIAL/PLATELET
Abs Immature Granulocytes: 0.01 10*3/uL (ref 0.00–0.07)
Basophils Absolute: 0 10*3/uL (ref 0.0–0.1)
Basophils Relative: 1 %
Eosinophils Absolute: 0.2 10*3/uL (ref 0.0–0.5)
Eosinophils Relative: 3 %
HCT: 34.6 % — ABNORMAL LOW (ref 36.0–46.0)
Hemoglobin: 11.1 g/dL — ABNORMAL LOW (ref 12.0–15.0)
Immature Granulocytes: 0 %
Lymphocytes Relative: 31 %
Lymphs Abs: 1.6 10*3/uL (ref 0.7–4.0)
MCH: 26.1 pg (ref 26.0–34.0)
MCHC: 32.1 g/dL (ref 30.0–36.0)
MCV: 81.4 fL (ref 80.0–100.0)
Monocytes Absolute: 0.3 10*3/uL (ref 0.1–1.0)
Monocytes Relative: 6 %
Neutro Abs: 3 10*3/uL (ref 1.7–7.7)
Neutrophils Relative %: 59 %
Platelets: 123 10*3/uL — ABNORMAL LOW (ref 150–400)
RBC: 4.25 MIL/uL (ref 3.87–5.11)
RDW: 14.6 % (ref 11.5–15.5)
WBC: 5.1 10*3/uL (ref 4.0–10.5)
nRBC: 0 % (ref 0.0–0.2)

## 2019-12-25 NOTE — Telephone Encounter (Signed)
Call returned to Cindy Trujillo. She has a return to work date the same date as her post op evaluation. She was thinking that she was to be out of work for 8 weeks. Dr. Gennette Pac office completed her paperwork. Encouraged to check with them regarding her return to work. Gyn Onc appointment moved to 10/13 due to Dr. Theora Gianotti not being in clinic 10/6. She prefers to stay with Dr. Theora Gianotti for her follow up since she performed surgery. Instructed that if she has any new issues to make [place] aware and she will be seen sooner.

## 2019-12-25 NOTE — Progress Notes (Signed)
Had TAH on 12/06/19; Still having mild amount of vaginal bleeding. Abd tender. Otherwise doing well.

## 2019-12-25 NOTE — Progress Notes (Signed)
Hematology/Oncology Consult note [name] [phone] [phone]   Patient Care Team: Virginia Crews, MD as PCP - General (Family Medicine) Clent Jacks, RN as Oncology Nurse Navigator  REFERRING PROVIDER: Virginia Crews, MD  CHIEF COMPLAINTS/REASON FOR VISIT:  Evaluation of thrombocytopenia  HISTORY OF PRESENTING ILLNESS:   Cindy Trujillo is a  64 y.o.  female with PMH listed below was seen in consultation at the request of  Brita Romp Dionne Bucy, MD  for evaluation of,  Thrombophilia work-up.    Patient was previously seen by me for thrombocytopenia.  She was last seen by me on 03/21/2019.  She lost follow-up. Patient was recently evaluated by gynecology for postmenopausal bleeding. Patient has had pelvic ultrasound and also 10/31/2019 she underwent D&C with MyoSure. Pathology showed endometrial adenocarcinoma, endometrioid type, FIGO grade 1.  Background EIN/atypical hyperplasia. Patient was seen by Dr. Theora Gianotti and recommend surgical treatment with hysterectomy and bilateral salpingoophorectomy with pelvic washing, pelvic para-aortic lymph node sampling and sentinel lymph node biopsy. Dr. Theora Gianotti recommends the patient reestablish care with me for thrombophilia work-up.  Patient is at risk of developing thrombosis perioperatively given the type of procedure, history of DVT, malignancy.   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  She has been on Eliquis 2.5 mg twice daily.  She was advised by primary care provider to cut 5 mg tablets to half for 2.5 mg twice daily.   Managed by primary care provider.  DVT was found incidentally to her procedure for varicose vein.  For her chronic thrombocytopenia, her initial work-up was nonremarkable except borderline vitamin B12 level.  She was recommended to observe blood counts and if further worsening of thrombocytopenia, I recommend bone marrow biopsy.   She lost follow-up since December 2020. Ultrasound liver showed coarse hepatic echo Texture suggesting fatty liver disease.   INTERVAL HISTORY Cindy Trujillo is a 64 y.o. female who has above history reviewed by me today presents for follow up visit for history of DVT, factor V Leiden heterozygous mutation, thrombocytopenia and endometrial cancer Problems and complaints are listed below: 12/06/2019 underwent  hysterectomy with oophorectomy.  Pathology showed endometrioid endometiral adenocarcinoma, low grade, Figo 1, invasive 71m/16mm of total myometrial thickness. No lymph node involvement. pT1a pN0.  She feels well. Currently on Eliquis 521mBID as post surgery anticoagulation.    Review of Systems  Constitutional: Negative for appetite change, chills, fatigue and fever.  HENT:   Negative for hearing loss and voice change.   Eyes: Negative for eye problems.  Respiratory: Negative for chest tightness and cough.   Cardiovascular: Negative for chest pain.  Gastrointestinal: Negative for abdominal distention, abdominal pain and blood in stool.  Endocrine: Negative for hot flashes.  Genitourinary: Negative for difficulty urinating and frequency.   Musculoskeletal: Negative for arthralgias.  Skin: Negative for itching and rash.  Neurological: Negative for extremity weakness.  Hematological: Negative for adenopathy.  Psychiatric/Behavioral: Negative for confusion.    MEDICAL HISTORY:  Past Medical History:  Diagnosis Date  . Diabetes mellitus without complication ([facility]  . DVT (deep venous thrombosis) ([facility]/2020   right leg  . GERD (gastroesophageal reflux disease)   . Hyperlipidemia     SURGICAL HISTORY: Past Surgical History:  Procedure Laterality Date  . COLONOSCOPY WITH PROPOFOL N/A 08/23/2017   Procedure: COLONOSCOPY WITH PROPOFOL;  Surgeon: ElManya SilvasMD;  [facility];  Service: Endoscopy;  Laterality: N/A;  .  HYSTEROSCOPY WITH D & C N/A 10/31/2019    Procedure: DILATATION AND CURETTAGE /HYSTEROSCOPY WITH MYOSURE RESECTION;  Surgeon: Homero Fellers, MD;  Location: [facility];  Service: Gynecology;  Laterality: N/A;  . ROBOTIC ASSISTED TOTAL HYSTERECTOMY WITH BILATERAL SALPINGO OOPHERECTOMY N/A 12/06/2019   Procedure: XI ROBOTIC ASSISTED TOTAL HYSTERECTOMY WITH BILATERAL SALPINGO OOPHORECTOMY;  Surgeon: Gillis Ends, MD;  Location: [facility];  Service: Gynecology;  Laterality: N/A;  . SENTINEL NODE BIOPSY N/A 12/06/2019   Procedure: SENTINEL NODE BIOPSY;  Surgeon: Gillis Ends, MD;  Location: [facility];  Service: Gynecology;  Laterality: N/A;  . TONSILLECTOMY    . TONSILLECTOMY AND ADENOIDECTOMY  1968  . VEIN SURGERY  05/2019   laser    SOCIAL HISTORY: Social History   Socioeconomic History  . Marital status: Married    Spouse name: Francee Piccolo  . Number of children: 0  . Years of education: some college  . Highest education level: Not on file  Occupational History  . Occupation: CARE MANGEMENT    Employer: [employer]  Tobacco Use  . Smoking status: Never Smoker  . Smokeless tobacco: Never Used  Vaping Use  . Vaping Use: Never used  Substance and Sexual Activity  . Alcohol use: No    Alcohol/week: 0.0 standard drinks  . Drug use: No  . Sexual activity: Not Currently  Other Topics Concern  . Not on file  Social History Narrative  . Not on file   Social Determinants of Health   Financial Resource Strain:   . Difficulty of Paying Living Expenses: Not on file  Food Insecurity:   . Worried About Charity fundraiser in the Last Year: Not on file  . Ran Out of Food in the Last Year: Not on file  Transportation Needs:   . Lack of Transportation (Medical): Not on file  . Lack of Transportation (Non-Medical): Not on file  Physical Activity:   . Days of Exercise per Week: Not on file  . Minutes of Exercise per Session: Not on file  Stress:   . Feeling of Stress : Not on file  Social  Connections:   . Frequency of Communication with Friends and Family: Not on file  . Frequency of Social Gatherings with Friends and Family: Not on file  . Attends Religious Services: Not on file  . Active Member of Clubs or Organizations: Not on file  . Attends Archivist Meetings: Not on file  . Marital Status: Not on file  Intimate Partner Violence:   . Fear of Current or Ex-Partner: Not on file  . Emotionally Abused: Not on file  . Physically Abused: Not on file  . Sexually Abused: Not on file    FAMILY HISTORY: Family History  Problem Relation Age of Onset  . COPD Mother        heavy smoker  . Pulmonary fibrosis Mother   . Fibromyalgia Mother   . COPD Father   . Melanoma Father   . COPD Sister   . COPD Brother   . COPD Sister   . COPD Sister   . Healthy Brother   . Heart failure Maternal Grandmother   . Heart attack Maternal Grandfather   . Cancer Paternal Grandmother        unsure of type, had mets in colon  . Heart attack Paternal Grandfather   . Cancer Paternal Grandfather        unknown  . Breast cancer Neg Hx   .  Cervical cancer Neg Hx     ALLERGIES:  is allergic to sulfa antibiotics.  MEDICATIONS:  Current Outpatient Medications  Medication Sig Dispense Refill  . acetaminophen (TYLENOL) 500 MG tablet Take 500 mg by mouth every 6 (six) hours as needed (for pain.).     Marland Kitchen apixaban (ELIQUIS) 5 MG TABS tablet Take 1 tablet (5 mg total) by mouth 2 (two) times daily. 60 tablet 0  . Blood Glucose Monitoring Suppl (FREESTYLE LITE) DEVI     . clindamycin (CLEOCIN) 300 MG capsule Take 1 capsule (300 mg total) by mouth 3 (three) times daily for 10 days. 30 capsule 0  . dapagliflozin propanediol (FARXIGA) 10 MG TABS tablet Take 10 mg by mouth daily. (Patient taking differently: Take 10 mg by mouth daily at 6 PM. ) 90 tablet 3  . FREESTYLE LITE test strip 1 each by Other route in the morning, at noon, in the evening, and at bedtime. 100 each 3  . FreeStyle  [place]ick II Lancets MISC Use as instructed to check blood glucose daily 100 each 5  . glucose blood (FREESTYLE TEST STRIPS) test strip Use as instructed to check blood glucose daily 100 each 12  . glucose blood test strip     . glucose monitoring kit (FREESTYLE) monitoring kit 1 each by Does not apply route as needed for other. 1 each 0  . Lancets (FREESTYLE) lancets     . lisinopril (ZESTRIL) 5 MG tablet Take 1 tablet (5 mg total) by mouth daily. (Patient taking differently: Take 5 mg by mouth daily with supper. ) 90 tablet 3  . loratadine (CLARITIN) 10 MG tablet Take 1 tablet (10 mg total) by mouth daily. 90 tablet 3  . metFORMIN (GLUCOPHAGE) 1000 MG tablet Take 1 tablet (1,000 mg total) by mouth 2 (two) times daily with a meal. 180 tablet 3  . metoCLOPramide (REGLAN) 5 MG tablet Take 1 tablet (5 mg total) by mouth 4 (four) times daily -  before meals and at bedtime. 28 tablet 1  . Multiple Vitamin (MULTIVITAMIN WITH MINERALS) TABS tablet Take 1 tablet by mouth 2 (two) times a week.    Marland Kitchen omeprazole (PRILOSEC) 20 MG capsule TAKE 1 CAPSULE (20 MG TOTAL) BY MOUTH DAILY. 90 capsule 3  . rosuvastatin (CRESTOR) 5 MG tablet Take 1 tablet (5 mg total) by mouth daily. (Patient taking differently: Take 5 mg by mouth every evening. ) 90 tablet 3  . vitamin B-12 (CYANOCOBALAMIN) 1000 MCG tablet Take 1 tablet (1,000 mcg total) by mouth daily. (Patient taking differently: Take 1,000 mcg by mouth every evening. ) 90 tablet 1  . ferrous sulfate 325 (65 FE) MG tablet Take 325 mg by mouth 2 (two) times a week.  (Patient not taking: Reported on 12/25/2019)    . oxyCODONE (OXY IR/ROXICODONE) 5 MG immediate release tablet Take 1-2 tablets (5-10 mg total) by mouth every 4 (four) hours as needed for moderate pain. (Patient not taking: Reported on 12/25/2019) 30 tablet 0  . simethicone (MYLICON) 80 MG chewable tablet Chew 1 tablet (80 mg total) by mouth 4 (four) times daily. (Patient not taking: Reported on 12/25/2019) 30  tablet 1   No current facility-administered medications for this visit.     PHYSICAL EXAMINATION: ECOG PERFORMANCE STATUS: 0 - Asymptomatic Vitals:   12/25/19 1032  BP: (!) 126/54  Pulse: 69  Resp: 17  Temp: 98 F (36.7 C)   Filed Weights   12/25/19 1032  Weight: 227 lb (103 kg)  Physical Exam Constitutional:      General: She is not in acute distress. HENT:     Head: Normocephalic and atraumatic.  Eyes:     General: No scleral icterus.    Pupils: Pupils are equal, round, and reactive to light.  Cardiovascular:     Rate and Rhythm: Normal rate and regular rhythm.     Heart sounds: Normal heart sounds.  Pulmonary:     Effort: Pulmonary effort is normal. No respiratory distress.     Breath sounds: No wheezing.  Abdominal:     General: Bowel sounds are normal. There is no distension.     Palpations: Abdomen is soft. There is no mass.     Tenderness: There is no abdominal tenderness.  Musculoskeletal:        General: No deformity. Normal range of motion.     Cervical back: Normal range of motion and neck supple.  Skin:    General: Skin is warm and dry.     Findings: No erythema or rash.  Neurological:     Mental Status: She is alert and oriented to person, place, and time. Mental status is at baseline.     Cranial Nerves: No cranial nerve deficit.     Coordination: Coordination normal.  Psychiatric:        Mood and Affect: Mood normal.     LABORATORY DATA:  I have reviewed the data as listed Lab Results  Component Value Date   WBC 5.1 12/25/2019   HGB 11.1 (L) 12/25/2019   HCT 34.6 (L) 12/25/2019   MCV 81.4 12/25/2019   PLT 123 (L) 12/25/2019   Recent Labs    06/26/19 0908 11/23/19 0806 12/06/19 1802  NA 141 139 137  K 4.2 4.2 4.5  CL 108* 109 107  CO2 19* 20* 21*  GLUCOSE 136* 186* 170*  BUN 15 15 13   CREATININE 1.13* 1.12* 1.19*  CALCIUM 9.3 9.2 8.4*  GFRNONAA 53* 53* 49*  GFRAA 61 >60 57*  PROT 6.7 7.3 6.3*  ALBUMIN 4.0 3.9 3.5  AST 32  42* 44*  ALT 20 26 25   ALKPHOS 92 88 68  BILITOT 0.8 1.2 1.5*   Iron/TIBC/Ferritin/ %Sat No results found for: IRON, TIBC, FERRITIN, IRONPCTSAT    RADIOGRAPHIC STUDIES: I have personally reviewed the radiological images as listed and agreed with the findings in the report.  No results found.    ASSESSMENT & PLAN:  1. History of DVT (deep vein thrombosis)   2. Thrombocytopenia ([place])   3. Endometrial cancer ([place])   Cancer Staging Endometrial cancer [facility]) Staging form: Corpus Uteri - Carcinoma and Carcinosarcoma, AJCC 8th Edition - Pathologic stage from 12/25/2019: Stage IA (pT1a, pN0, cM0) - Signed by Earlie Server, MD on 12/25/2019   #Endometrioid endometrial cancer, stage IA, Loss of MLH1 No LVI,  Pathology was reviewed with patient Low risk, recommend observation now.  Future immunotherapy treatment option if she developed recurrence/metastasis Appreciate GYNONC input.   # History of DVT, and heterozygous factor V leiden mutation.  Finish total 4 weeks post surgery therapeutic Eliquis 5 mg bid. After that recommend patient to resume Eliquis 2.5 mg twice daily. Recommend long-term anticoagulation prophylaxis.  #Chronic thrombocytopenia, previously worked up. Recent blood work showed decrease of platelet count level likely secondary to surgery. Repeat CBC today showed improved platelet counts. Stable.  #History of borderline B12 deficiency, patient has taken vitamin B12 supplementation 1000 MCG daily. I recommend patient to hold vitamin B12 supplementation for now. B12 level  has improved currently high. Recommend patient utilize multivitamin.   Orders Placed This Encounter  Procedures  . CBC with Differential/Platelet    Standing Status:   Future    Number of Occurrences:   1    Standing Expiration Date:   12/24/2020  . CBC with Differential/Platelet    Standing Status:   Future    Standing Expiration Date:   12/24/2020  . Comprehensive metabolic panel    Standing Status:    Future    Standing Expiration Date:   12/24/2020  . Vitamin B12    Standing Status:   Future    Standing Expiration Date:   12/24/2020    All questions were answered. The patient knows to call the clinic with any problems questions or concerns. CC Dr. Emelia Salisbury, Dionne Bucy, MD    Return of visit: 6 months  Earlie Server, MD, PhD Hematology Oncology [facility] Pager- [phone] 12/25/2019

## 2019-12-27 ENCOUNTER — Telehealth: Payer: Self-pay | Admitting: Obstetrics and Gynecology

## 2019-12-27 NOTE — Telephone Encounter (Signed)
Patient is returning call from [place] about her incision site. Patient reports healing very well. Patient is needs to speak about an extension date due to patient not follow up with Dr. Mallie Snooks 01/24/20.

## 2019-12-28 NOTE — Telephone Encounter (Signed)
This patient will need her work FMLA extended to 01/25/2020- thank you

## 2019-12-29 ENCOUNTER — Other Ambulatory Visit: Payer: Self-pay

## 2019-12-29 ENCOUNTER — Encounter: Payer: Self-pay | Admitting: Family Medicine

## 2019-12-29 ENCOUNTER — Ambulatory Visit: Payer: 59 | Admitting: Family Medicine

## 2019-12-29 VITALS — BP 94/50 | HR 78 | Temp 98.8°F | Wt 222.0 lb

## 2019-12-29 DIAGNOSIS — E1169 Type 2 diabetes mellitus with other specified complication: Secondary | ICD-10-CM

## 2019-12-29 DIAGNOSIS — R222 Localized swelling, mass and lump, trunk: Secondary | ICD-10-CM | POA: Diagnosis not present

## 2019-12-29 DIAGNOSIS — E1129 Type 2 diabetes mellitus with other diabetic kidney complication: Secondary | ICD-10-CM

## 2019-12-29 DIAGNOSIS — I82531 Chronic embolism and thrombosis of right popliteal vein: Secondary | ICD-10-CM

## 2019-12-29 DIAGNOSIS — E785 Hyperlipidemia, unspecified: Secondary | ICD-10-CM

## 2019-12-29 DIAGNOSIS — R809 Proteinuria, unspecified: Secondary | ICD-10-CM

## 2019-12-29 NOTE — Patient Instructions (Signed)
Hold lisinopril until follow-up  No other changes to medications

## 2019-12-29 NOTE — Assessment & Plan Note (Signed)
Given hypotension, though asymptomatic, will hold lisinopril

## 2019-12-29 NOTE — Progress Notes (Signed)
Established patient visit   Patient: Cindy Trujillo   DOB: [birth year]   64 y.o. Female  MRN: [id number] Visit Date: 12/29/2019  Today's healthcare provider: Lavon Paganini, MD   Chief Complaint  Patient presents with   Diabetes   Hyperlipidemia   Subjective    HPI   Pt states she has noticed a "knot" on her right hip yesterday.  She states it is under her skin.  She denies pain, drainage, and states it is about the same size.    Diabetes Mellitus Type II, follow-up  Lab Results  Component Value Date   HGBA1C 7.7 (H) 11/23/2019   HGBA1C 6.9 (A) 06/26/2019   HGBA1C 6.7 (H) 02/27/2019   Last seen for diabetes 6 months ago.  Management since then includes continuing the same treatment. She reports excellent compliance with treatment. She is not having side effects.   Home blood sugar records: 90's-100's  Episodes of hypoglycemia? No    Current insulin regiment: none Most Recent Eye Exam: 03/2019  ---------------------------------------------------------------------------------------------------  Lipid/Cholesterol, follow-up  Last Lipid Panel: Lab Results  Component Value Date   CHOL 137 06/26/2019   LDLCALC 51 06/26/2019   HDL 51 06/26/2019   TRIG 220 (H) 06/26/2019    She was last seen for this 6 months ago.  Management since that visit includes no changes.  She reports excellent compliance with treatment. She is not having side effects.   Symptoms: Yes appetite changes No foot ulcerations  No chest pain No chest pressure/discomfort  No dyspnea No orthopnea  No fatigue No lower extremity edema  No palpitations No paroxysmal nocturnal dyspnea  No nausea No numbness or tingling of extremity  No polydipsia No polyuria  No speech difficulty No syncope   She is following a Regular diet.   Last metabolic panel Lab Results  Component Value Date   GLUCOSE 170 (H) 12/06/2019   NA 137 12/06/2019   K 4.5 12/06/2019   BUN 13 12/06/2019    CREATININE 1.19 (H) 12/06/2019   GFRNONAA 49 (L) 12/06/2019   GFRAA 57 (L) 12/06/2019   CALCIUM 8.4 (L) 12/06/2019   AST 44 (H) 12/06/2019   ALT 25 12/06/2019   The 10-year ASCVD risk score Mikey Bussing DC Jr., et al., 2013) is: 3.5%  ---------------------------------------------------------------------------------------------------   Patient Active Problem List   Diagnosis Date Noted   S/P hysterectomy with oophorectomy 12/07/2019   Endometrial cancer ([place]) 12/06/2019   Post-menopausal bleeding 09/21/2019   Cervical cancer screening 09/21/2019   Annual physical exam 09/21/2019   DVT (deep venous thrombosis) ([place]) 11/29/2018   Acute deep vein thrombosis (DVT) of femoral vein of right lower extremity ([place]) 10/12/2018   Microalbuminuria due to type 2 diabetes mellitus ([place]) 06/23/2018   Varicose veins of leg with pain, bilateral 05/20/2018   Varicose veins of both lower extremities 08/27/2017   T2DM (type 2 diabetes mellitus) ([place])    Hyperlipidemia associated with type 2 diabetes mellitus ([place])    Allergic rhinitis 12/11/2016   GERD (gastroesophageal reflux disease) 12/11/2016   Morbid obesity ([place]) 12/11/2016   Past Medical History:  Diagnosis Date   Diabetes mellitus without complication ([place])    DVT (deep venous thrombosis) ([place]) 08/2018   right leg   GERD (gastroesophageal reflux disease)    Hyperlipidemia    Social History   Tobacco Use   Smoking status: Never Smoker   Smokeless tobacco: Never Used  Vaping Use   Vaping Use: Never used  Substance Use Topics  Alcohol use: No    Alcohol/week: 0.0 standard drinks   Drug use: No   Allergies  Allergen Reactions   Sulfa Antibiotics Hives     Medications: Outpatient Medications Prior to Visit  Medication Sig   acetaminophen (TYLENOL) 500 MG tablet Take 500 mg by mouth every 6 (six) hours as needed (for pain.).    apixaban (ELIQUIS) 5 MG TABS tablet Take 1 tablet (5 mg total) by mouth 2 (two)  times daily.   Blood Glucose Monitoring Suppl (FREESTYLE LITE) DEVI    dapagliflozin propanediol (FARXIGA) 10 MG TABS tablet Take 10 mg by mouth daily. (Patient taking differently: Take 10 mg by mouth daily at 6 PM. )   FREESTYLE LITE test strip 1 each by Other route in the morning, at noon, in the evening, and at bedtime.   FreeStyle Unistick II Lancets MISC Use as instructed to check blood glucose daily   glucose blood (FREESTYLE TEST STRIPS) test strip Use as instructed to check blood glucose daily   glucose blood test strip    glucose monitoring kit (FREESTYLE) monitoring kit 1 each by Does not apply route as needed for other.   Lancets (FREESTYLE) lancets    lisinopril (ZESTRIL) 5 MG tablet Take 1 tablet (5 mg total) by mouth daily. (Patient taking differently: Take 5 mg by mouth daily with supper. )   loratadine (CLARITIN) 10 MG tablet Take 1 tablet (10 mg total) by mouth daily.   metFORMIN (GLUCOPHAGE) 1000 MG tablet Take 1 tablet (1,000 mg total) by mouth 2 (two) times daily with a meal.   metoCLOPramide (REGLAN) 5 MG tablet Take 1 tablet (5 mg total) by mouth 4 (four) times daily -  before meals and at bedtime.   Multiple Vitamin (MULTIVITAMIN WITH MINERALS) TABS tablet Take 1 tablet by mouth 2 (two) times a week.   omeprazole (PRILOSEC) 20 MG capsule TAKE 1 CAPSULE (20 MG TOTAL) BY MOUTH DAILY.   rosuvastatin (CRESTOR) 5 MG tablet Take 1 tablet (5 mg total) by mouth daily. (Patient taking differently: Take 5 mg by mouth every evening. )   simethicone (MYLICON) 80 MG chewable tablet Chew 1 tablet (80 mg total) by mouth 4 (four) times daily.   vitamin B-12 (CYANOCOBALAMIN) 1000 MCG tablet Take 1 tablet (1,000 mcg total) by mouth daily. (Patient taking differently: Take 1,000 mcg by mouth every evening. )   ferrous sulfate 325 (65 FE) MG tablet Take 325 mg by mouth 2 (two) times a week.  (Patient not taking: Reported on 12/29/2019)   [DISCONTINUED] oxyCODONE (OXY  IR/ROXICODONE) 5 MG immediate release tablet Take 1-2 tablets (5-10 mg total) by mouth every 4 (four) hours as needed for moderate pain. (Patient not taking: Reported on 12/25/2019)   No facility-administered medications prior to visit.    Review of Systems  Constitutional: Positive for appetite change (Decrease in appetite). Negative for activity change, chills, diaphoresis, fatigue, fever and unexpected weight change.  Respiratory: Negative.   Cardiovascular: Negative.   Gastrointestinal: Negative.   Endocrine: Negative.   Neurological: Negative for dizziness, light-headedness and headaches.      Objective    BP (!) 94/50 (BP Location: Left Arm, Patient Position: Sitting, Cuff Size: Large)    Pulse 78    Temp 98.8 F (37.1 C) (Oral)    Wt 222 lb (100.7 kg)    SpO2 99%    BMI 38.11 kg/m    Physical Exam Vitals reviewed.  Constitutional:      General: She is not  in acute distress.    Appearance: Normal appearance. She is well-developed. She is not diaphoretic.  HENT:     Head: Normocephalic and atraumatic.  Eyes:     General: No scleral icterus.    Conjunctiva/sclera: Conjunctivae normal.  Neck:     Thyroid: No thyromegaly.  Cardiovascular:     Rate and Rhythm: Normal rate and regular rhythm.     Pulses: Normal pulses.     Heart sounds: Normal heart sounds. No murmur heard.   Pulmonary:     Effort: Pulmonary effort is normal. No respiratory distress.     Breath sounds: Normal breath sounds. No wheezing, rhonchi or rales.  Musculoskeletal:     Cervical back: Neck supple.     Right lower leg: No edema.     Left lower leg: No edema.  Lymphadenopathy:     Cervical: No cervical adenopathy.  Skin:    General: Skin is warm and dry.     Findings: No rash.     Comments: Small subcutaneous nodule without isnus tract, mobile, on R lower abd wall  Neurological:     Mental Status: She is alert and oriented to person, place, and time. Mental status is at baseline.  Psychiatric:         Mood and Affect: Mood normal.        Behavior: Behavior normal.       No results found for any visits on 12/29/19.  Assessment & Plan     Problem List Items Addressed This Visit      Cardiovascular and Mediastinum   DVT (deep venous thrombosis) (HCC)    Also followed by hematology and vascular surgery Was found incidentally Given heterozygous factor V Leiden mutation, hematology has discussed with patient that she will be on lifelong anticoagulation She is to finish her treatment dose of Eliquis 5 mg twice daily and then decrease to Eliquis 2.5 mg twice daily long-term        Endocrine   T2DM (type 2 diabetes mellitus) ([place]) - Primary    Chronic and previously well controlled, but A1c prior to surgery was elevated at 7.7 Patient states that she was eating more carbohydrates at that time due to stress No changes in medications today She will work on lifestyle interventions and we will recheck her A1c in 2 to 3 months Up-to-date on screenings With microalbuminuria as above      Hyperlipidemia associated with type 2 diabetes mellitus ([place])    Did not tolerate Lipitor due to myalgias Tolerating Crestor well Reviewed last lipid panel-well-controlled      Microalbuminuria due to type 2 diabetes mellitus ([place])    Given hypotension, though asymptomatic, will hold lisinopril       Other Visit Diagnoses    Nodule of skin of abdomen        - unclear etiology - advised that this is not a LAD - ould be seroma from recent surgery, but not near her incisions -Could be hematoma from Lovenox/heparin shots in the hospital -Patient will continue to monitor for size and we will consider ultrasound if it continues to increase in size  Return in about 3 months (around 03/29/2020) for chronic disease f/u.      I, Lavon Paganini, MD, have reviewed all documentation for this visit. The documentation on 12/29/19 for the exam, diagnosis, procedures, and orders are all accurate  and complete.   Tameshia [place], Dionne Bucy, MD, MPH [facility]

## 2019-12-29 NOTE — Assessment & Plan Note (Signed)
Also followed by hematology and vascular surgery Was found incidentally Given heterozygous factor V Leiden mutation, hematology has discussed with patient that she will be on lifelong anticoagulation She is to finish her treatment dose of Eliquis 5 mg twice daily and then decrease to Eliquis 2.5 mg twice daily long-term

## 2019-12-29 NOTE — Assessment & Plan Note (Signed)
Chronic and previously well controlled, but A1c prior to surgery was elevated at 7.7 Patient states that she was eating more carbohydrates at that time due to stress No changes in medications today She will work on lifestyle interventions and we will recheck her A1c in 2 to 3 months Up-to-date on screenings With microalbuminuria as above

## 2019-12-29 NOTE — Assessment & Plan Note (Signed)
Did not tolerate Lipitor due to myalgias Tolerating Crestor well Reviewed last lipid panel-well-controlled

## 2019-12-30 ENCOUNTER — Encounter: Payer: Self-pay | Admitting: Obstetrics and Gynecology

## 2020-01-02 ENCOUNTER — Encounter: Payer: Self-pay | Admitting: Obstetrics and Gynecology

## 2020-01-02 LAB — SURGICAL PATHOLOGY

## 2020-01-03 ENCOUNTER — Ambulatory Visit: Payer: 59 | Admitting: Family Medicine

## 2020-01-03 NOTE — Telephone Encounter (Signed)
Patient is updating fax number for [facility] [phone] and [phone]

## 2020-01-04 NOTE — Telephone Encounter (Signed)
Competed and updated.

## 2020-01-16 ENCOUNTER — Telehealth: Payer: Self-pay

## 2020-01-16 NOTE — Telephone Encounter (Signed)
Pt is aware.  

## 2020-01-16 NOTE — Telephone Encounter (Signed)
Pt states she recently had a hysterectomy by [facility] on 8/25. She is wondering when she can start driving?

## 2020-01-16 NOTE — Telephone Encounter (Signed)
She is okay to drive now

## 2020-01-17 ENCOUNTER — Ambulatory Visit: Payer: 59

## 2020-01-24 ENCOUNTER — Inpatient Hospital Stay: Payer: 59

## 2020-01-24 ENCOUNTER — Inpatient Hospital Stay: Payer: 59 | Attending: Obstetrics and Gynecology | Admitting: Obstetrics and Gynecology

## 2020-01-24 ENCOUNTER — Other Ambulatory Visit: Payer: Self-pay

## 2020-01-24 VITALS — BP 115/59 | HR 77 | Temp 97.7°F | Resp 18 | Wt 224.6 lb

## 2020-01-24 DIAGNOSIS — Z90722 Acquired absence of ovaries, bilateral: Secondary | ICD-10-CM | POA: Insufficient documentation

## 2020-01-24 DIAGNOSIS — D696 Thrombocytopenia, unspecified: Secondary | ICD-10-CM | POA: Diagnosis not present

## 2020-01-24 DIAGNOSIS — C541 Malignant neoplasm of endometrium: Secondary | ICD-10-CM | POA: Diagnosis not present

## 2020-01-24 DIAGNOSIS — R17 Unspecified jaundice: Secondary | ICD-10-CM | POA: Diagnosis not present

## 2020-01-24 DIAGNOSIS — D649 Anemia, unspecified: Secondary | ICD-10-CM | POA: Diagnosis not present

## 2020-01-24 DIAGNOSIS — R7989 Other specified abnormal findings of blood chemistry: Secondary | ICD-10-CM | POA: Diagnosis not present

## 2020-01-24 DIAGNOSIS — Z90710 Acquired absence of both cervix and uterus: Secondary | ICD-10-CM | POA: Diagnosis not present

## 2020-01-24 LAB — COMPREHENSIVE METABOLIC PANEL
ALT: 28 U/L (ref 0–44)
AST: 52 U/L — ABNORMAL HIGH (ref 15–41)
Albumin: 4 g/dL (ref 3.5–5.0)
Alkaline Phosphatase: 81 U/L (ref 38–126)
Anion gap: 9 (ref 5–15)
BUN: 20 mg/dL (ref 8–23)
CO2: 22 mmol/L (ref 22–32)
Calcium: 9 mg/dL (ref 8.9–10.3)
Chloride: 109 mmol/L (ref 98–111)
Creatinine, Ser: 1.16 mg/dL — ABNORMAL HIGH (ref 0.44–1.00)
GFR, Estimated: 50 mL/min — ABNORMAL LOW (ref >60)
Glucose, Bld: 125 mg/dL — ABNORMAL HIGH (ref 70–99)
Potassium: 4.4 mmol/L (ref 3.5–5.1)
Sodium: 140 mmol/L (ref 135–145)
Total Bilirubin: 0.8 mg/dL (ref 0.3–1.2)
Total Protein: 7.6 g/dL (ref 6.5–8.1)

## 2020-01-24 LAB — CBC WITH DIFFERENTIAL/PLATELET
Abs Immature Granulocytes: 0.01 10*3/uL (ref 0.00–0.07)
Basophils Absolute: 0 10*3/uL (ref 0.0–0.1)
Basophils Relative: 1 %
Eosinophils Absolute: 0.1 10*3/uL (ref 0.0–0.5)
Eosinophils Relative: 3 %
HCT: 34.2 % — ABNORMAL LOW (ref 36.0–46.0)
Hemoglobin: 11.2 g/dL — ABNORMAL LOW (ref 12.0–15.0)
Immature Granulocytes: 0 %
Lymphocytes Relative: 38 %
Lymphs Abs: 1.6 10*3/uL (ref 0.7–4.0)
MCH: 26.5 pg (ref 26.0–34.0)
MCHC: 32.7 g/dL (ref 30.0–36.0)
MCV: 80.9 fL (ref 80.0–100.0)
Monocytes Absolute: 0.3 10*3/uL (ref 0.1–1.0)
Monocytes Relative: 6 %
Neutro Abs: 2.1 10*3/uL (ref 1.7–7.7)
Neutrophils Relative %: 52 %
Platelets: 89 10*3/uL — ABNORMAL LOW (ref 150–400)
RBC: 4.23 MIL/uL (ref 3.87–5.11)
RDW: 15 % (ref 11.5–15.5)
WBC: 4.2 10*3/uL (ref 4.0–10.5)
nRBC: 0 % (ref 0.0–0.2)

## 2020-01-24 NOTE — Progress Notes (Signed)
Gynecologic Oncology Interval Visit   Referring Provider: Adrian Prows, MD  Chief Concern: endometrial cancer, postoperative evaluation.   Subjective:  Cindy Trujillo is a 64 y.o. G0P0 female who is seen in consultation from Dr. Gilman Schmidt for endometrial cancer who presented initially with complaints of endometrial cancer.   She presents for postoperative evaluation. She underwent Exam under anesthesia, Robotic assisted total hysterectomy and bilateral salpingo-oophorectomy with sentinel node injection, mapping, and biopsies on 12/06/2019.    DIAGNOSIS:  A. UTERUS WITH CERVIX BILATERAL FALLOPIAN TUBES AND OVARIES; TOTAL  HYSTERECTOMY WITH BILATERAL SALPINGO-OOPHORECTOMY:  - ENDOMETRIUM:    - ENDOMETRIOID ENDOMETRIAL ADENOCARCINOMA, LOW GRADE (FIGO 1),  INVASIVE 2 MM OF 16 MM TOTAL MYOMETRIAL THICKNESS (12.5%).    - BACKGROUND OF DISORDERED PROLIFERATIVE ENDOMETRIUM WITH CHANGES  SUGGESTIVE OF PRIOR ABLATION.    - SEE CANCER SUMMARY.  - UTERINE CERVIX:    - BENIGN TRANSFORMATION ZONE WITH PATCHY IMMATURE SQUAMOUS  METAPLASIA.   - NEGATIVE FOR SQUAMOUS INTRAEPITHELIAL LESION AND MALIGNANCY.  - MYOMETRIUM:    - NO SIGNIFICANT HISTOPATHOLOGIC CHANGE.  - FALLOPIAN TUBES:    - BENIGN PARATUBAL CYSTS    - OTHERWISE NO SIGNIFICANT HISTOPATHOLOGIC CHANGE.  - OVARIES:    - BENIGN PHYSIOLOGIC CHANGES.   B. LYMPH NODE, RIGHT EXTERNAL ILIAC SENTINEL; EXCISION:  - ONE LYMPH NODE, NEGATIVE FOR MALIGNANCY (0/1).   C. LYMPH NODE, LEFT EXTERNAL ILIAC SENTINEL; EXCISION:  - THREE LYMPH NODES, NEGATIVE FOR MALIGNANCY (0/3).   D. LYMPH NODE, RIGHT EXTERNAL ILIAC; EXCISION:  - TWO LYMPH NODES, NEGATIVE FOR MALIGNANCY (0/2).   Immunohistochemistry (IHC) Testing for DNA Mismatch Repair (MMR)  Proteins:  Results:  MLH1: Loss of protein expression  MSH2: Intact nuclear expression  MSH6: Intact nuclear expression  PMS2: Loss of protein expression  IHC  Interpretation: Abnormal result with absent nuclear staining for  MLH1 and PMS2, high probability of MSI-H.   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   She is seeing Dr. Tasia Catchings for thrombophilia evaluation and management of VTE.   Gynecologic Oncology History Cindy Trujillo is a pleasant female who is seen in consultation from Dr. Gilman Schmidt for endometrial cancer who presented initially with complaints of endometrial cancer.   She reported PMB which has been occurring on and off since June of 2021.  Evaluation included the following:   Pelvic US 09/21/2019 FINDINGS: Uterus: measurements: 8.6 x 3.2 x 4.9 cm = volume: 71.8 mL. Uterus is anteverted. No fibroids or other mass. Endometrium: Focal endometrial lesion/thickening measuring 1.1 x 0.9 x 1.4 cm seen within the central aspect of the endometrial complex. Associated scattered areas of internal vascularity. Elsewhere, the endometrial stripe is thin measuring no more than 2 mm.  Right ovary: Not visualized.  No adnexal mass. Left ovary: Measurements: 1.9 x 1.5 x 1.9 cm = volume: 1.7 mL. Normal appearance/no adnexal mass. IMPRESSION: 1. 1.1 x 0.9 x 1.4 cm focal endometrial lesion/thickening as above. Finding is nonspecific, with primary differential considerations including an endometrial polyp, focal endometrial hyperplasia, or possibly endometrial carcinoma. In the setting of post-menopausal bleeding, endometrial sampling is indicated to exclude carcinoma. If results are benign, sonohysterogram should be considered for focal lesion work-up prior to hysteroscopy. (Ref: Radiological Reasoning: Algorithmic Workup of Abnormal Vaginal Bleeding with Endovaginal Sonography and Sonohysterography. AJR 2008; 786:V67-20). 2. Otherwise normal sonographic appearance of the  uterus. 3. Normal  left ovary, with nonvisualization of the right ovary. No adnexal mass or free fluid.  Pap 09/21/2019 NILM; HRHPV negative  10/31/2019 D&C with Myosure Uterus sounded to 9 cm.  Hysteroscopy revealed polypoid tissue growing from the right cornua.  Pathology A. ENDOMETRIUM, CURETTAGE:  - ENDOMETRIAL ADENOCARCINOMA, ENDOMETRIOID TYPE, FIGO GRADE 1.  - BACKGROUND EIN / ATYPICAL HYPERPLASIA.  Of note she had a DVT in May of 2020. Currently on Eliquis. DVT was found incidentally to her procedure for varicose veins.   She also has h/o low platelets and follows up with hematology/oncology for a bone marrow biopsy.     Problem List: Patient Active Problem List   Diagnosis Date Noted  . Normocytic anemia 01/24/2020  . Elevated LFTs 01/24/2020  . Elevated bilirubin 01/24/2020  . Hypocalcemia 01/24/2020  . S/P hysterectomy with oophorectomy 12/07/2019  . Endometrial cancer ([place]) 12/06/2019  . Post-menopausal bleeding 09/21/2019  . Cervical cancer screening 09/21/2019  . Annual physical exam 09/21/2019  . DVT (deep venous thrombosis) ([place]) 11/29/2018  . Acute deep vein thrombosis (DVT) of femoral vein of right lower extremity ([place]) 10/12/2018  . Microalbuminuria due to type 2 diabetes mellitus ([place]) 06/23/2018  . Varicose veins of leg with pain, bilateral 05/20/2018  . Varicose veins of both lower extremities 08/27/2017  . T2DM (type 2 diabetes mellitus) ([place])   . Hyperlipidemia associated with type 2 diabetes mellitus ([place])   . Allergic rhinitis 12/11/2016  . GERD (gastroesophageal reflux disease) 12/11/2016  . Morbid obesity ([place]) 12/11/2016    Past Medical History: Past Medical History:  Diagnosis Date  . Diabetes mellitus without complication ([place])   . DVT (deep venous thrombosis) ([place]) 08/2018   right leg  . GERD (gastroesophageal reflux disease)   . Hyperlipidemia     Past Surgical History: Past Surgical History:  Procedure Laterality Date  . COLONOSCOPY WITH PROPOFOL  N/A 08/23/2017   Procedure: COLONOSCOPY WITH PROPOFOL;  Surgeon: Manya Silvas, MD;  Location: [facility] ENDOSCOPY;  Service: Endoscopy;  Laterality: N/A;  . HYSTEROSCOPY WITH D & C N/A 10/31/2019   Procedure: DILATATION AND CURETTAGE /HYSTEROSCOPY WITH MYOSURE RESECTION;  Surgeon: Homero Fellers, MD;  Location: [facility] ORS;  Service: Gynecology;  Laterality: N/A;  . ROBOTIC ASSISTED TOTAL HYSTERECTOMY WITH BILATERAL SALPINGO OOPHERECTOMY N/A 12/06/2019   Procedure: XI ROBOTIC ASSISTED TOTAL HYSTERECTOMY WITH BILATERAL SALPINGO OOPHORECTOMY;  Surgeon: Gillis Ends, MD;  Location: [facility] ORS;  Service: Gynecology;  Laterality: N/A;  . SENTINEL NODE BIOPSY N/A 12/06/2019   Procedure: SENTINEL NODE BIOPSY;  Surgeon: Gillis Ends, MD;  Location: [facility] ORS;  Service: Gynecology;  Laterality: N/A;  . TONSILLECTOMY    . TONSILLECTOMY AND ADENOIDECTOMY  1968  . VEIN SURGERY  05/2019   laser    Past Gynecologic History:  Gynecological History Menarche: 68 Menopause: 36 Describes periods as regular, monthly Last pap smear: 09/21/2019- NIL Last Mammogram: None, patient only has clinical exams- declines mammogram  Hx OCP usage for for 15-18 years Hx obesity for 20 years   OB History:  G0P0 OB History  Gravida Para Term Preterm AB Living  0 0 0 0 0 0  SAB TAB Ectopic Multiple Live Births  0 0 0 0 0    Family History: Family History  Problem Relation Age of Onset  . COPD Mother        heavy smoker  . Pulmonary fibrosis Mother   . Fibromyalgia Mother   . COPD Father   . Melanoma  Father   . COPD Sister   . COPD Brother   . COPD Sister   . COPD Sister   . Healthy Brother   . Heart failure Maternal Grandmother   . Heart attack Maternal Grandfather   . Cancer Paternal Grandmother        unsure of type, had mets in colon  . Heart attack Paternal Grandfather   . Cancer Paternal Grandfather        unknown  . Breast cancer Neg Hx   . Cervical cancer Neg Hx      Social History: Retired worked a a Production assistant, radio Social History   Socioeconomic History  . Marital status: Married    Spouse name: Francee Piccolo  . Number of children: 0  . Years of education: some college  . Highest education level: Not on file  Occupational History  . Occupation: CARE MANGEMENT    Employer: [employer]  Tobacco Use  . Smoking status: Never Smoker  . Smokeless tobacco: Never Used  Vaping Use  . Vaping Use: Never used  Substance and Sexual Activity  . Alcohol use: No    Alcohol/week: 0.0 standard drinks  . Drug use: No  . Sexual activity: Not Currently  Other Topics Concern  . Not on file  Social History Narrative  . Not on file   Social Determinants of Health   Financial Resource Strain:   . Difficulty of Paying Living Expenses: Not on file  Food Insecurity:   . Worried About Charity fundraiser in the Last Year: Not on file  . Ran Out of Food in the Last Year: Not on file  Transportation Needs:   . Lack of Transportation (Medical): Not on file  . Lack of Transportation (Non-Medical): Not on file  Physical Activity:   . Days of Exercise per Week: Not on file  . Minutes of Exercise per Session: Not on file  Stress:   . Feeling of Stress : Not on file  Social Connections:   . Frequency of Communication with Friends and Family: Not on file  . Frequency of Social Gatherings with Friends and Family: Not on file  . Attends Religious Services: Not on file  . Active Member of Clubs or Organizations: Not on file  . Attends Archivist Meetings: Not on file  . Marital Status: Not on file  Intimate Partner Violence:   . Fear of Current or Ex-Partner: Not on file  . Emotionally Abused: Not on file  . Physically Abused: Not on file  . Sexually Abused: Not on file    Allergies: Allergies  Allergen Reactions  . Sulfa Antibiotics Hives    Current Medications: Current Outpatient Medications  Medication Sig  Dispense Refill  . acetaminophen (TYLENOL) 500 MG tablet Take 500 mg by mouth every 6 (six) hours as needed (for pain.).     Marland Kitchen apixaban (ELIQUIS) 5 MG TABS tablet Take 1 tablet (5 mg total) by mouth 2 (two) times daily. 60 tablet 0  . Blood Glucose Monitoring Suppl (FREESTYLE LITE) DEVI     . dapagliflozin propanediol (FARXIGA) 10 MG TABS tablet Take 10 mg by mouth daily. (Patient taking differently: Take 10 mg by mouth daily at 6 PM. ) 90 tablet 3  . ferrous sulfate 325 (65 FE) MG tablet Take 325 mg by mouth 2 (two) times a week.     Marland Kitchen FREESTYLE LITE test strip 1 each by Other route in the morning, at noon, in  the evening, and at bedtime. 100 each 3  . FreeStyle Unistick II Lancets MISC Use as instructed to check blood glucose daily 100 each 5  . glucose blood (FREESTYLE TEST STRIPS) test strip Use as instructed to check blood glucose daily 100 each 12  . glucose blood test strip     . glucose monitoring kit (FREESTYLE) monitoring kit 1 each by Does not apply route as needed for other. 1 each 0  . Lancets (FREESTYLE) lancets     . lisinopril (ZESTRIL) 5 MG tablet Take 1 tablet (5 mg total) by mouth daily. (Patient taking differently: Take 5 mg by mouth daily with supper. ) 90 tablet 3  . loratadine (CLARITIN) 10 MG tablet Take 1 tablet (10 mg total) by mouth daily. 90 tablet 3  . metFORMIN (GLUCOPHAGE) 1000 MG tablet Take 1 tablet (1,000 mg total) by mouth 2 (two) times daily with a meal. 180 tablet 3  . Multiple Vitamin (MULTIVITAMIN WITH MINERALS) TABS tablet Take 1 tablet by mouth 2 (two) times a week.    Marland Kitchen omeprazole (PRILOSEC) 20 MG capsule TAKE 1 CAPSULE (20 MG TOTAL) BY MOUTH DAILY. 90 capsule 3  . rosuvastatin (CRESTOR) 5 MG tablet Take 1 tablet (5 mg total) by mouth daily. (Patient taking differently: Take 5 mg by mouth every evening. ) 90 tablet 3  . vitamin B-12 (CYANOCOBALAMIN) 1000 MCG tablet Take 1 tablet (1,000 mcg total) by mouth daily. (Patient taking differently: Take 1,000 mcg  by mouth every evening. ) 90 tablet 1  . metoCLOPramide (REGLAN) 5 MG tablet Take 1 tablet (5 mg total) by mouth 4 (four) times daily -  before meals and at bedtime. 28 tablet 1  . simethicone (MYLICON) 80 MG chewable tablet Chew 1 tablet (80 mg total) by mouth 4 (four) times daily. 30 tablet 1   No current facility-administered medications for this visit.    Review of Systems  General: hot flashes o/w no complaints  HEENT: no complaints  Lungs: no complaints  Cardiac: no complaints  GI: constipation o/w no complaints  GU: vaginal spotting and when she has to urinate she has to go right now.   Musculoskeletal: no complaints  Extremities: no complaints  Skin: no complaints  Neuro: no complaints  Endocrine: no complaints  Psych: no complaints       Objective:  Physical Examination:  BP (!) 115/59   Pulse 77   Temp 97.7 F (36.5 C) (Tympanic)   Resp 18   Wt 224 lb 9.6 oz (101.9 kg)   SpO2 100%   BMI 38.55 kg/m    ECOG Performance Status: 0 - Asymptomatic  GENERAL: Patient is a well appearing female in no acute distress HEENT:  PERRL, neck supple with midline trachea. Thyroid without masses.  NODES:  No inguinal lymphadenopathy palpated.  ABDOMEN:  Soft, nontender. No masses, ascites, or hernia. CDI without drainage or erythema.  EXTREMITIES:  No peripheral edema.   NEURO:  Nonfocal. Well oriented.  Appropriate affect.  Pelvic: chaperoned by nurse EGBUS: no lesions Cervix: surgically absent Vagina: no lesions, no discharge or bleeding Uterus: surgically absent BME: no palpable masses Rectovaginal: deferred    Lab Review   Lab Results  Component Value Date   WBC 5.1 12/25/2019   HGB 11.1 (L) 12/25/2019   HCT 34.6 (L) 12/25/2019   MCV 81.4 12/25/2019   PLT 123 (L) 12/25/2019     Chemistry      Component Value Date/Time   NA 137 12/06/2019 1802  NA 141 06/26/2019 0908   K 4.5 12/06/2019 1802   CL 107 12/06/2019 1802   CO2 21 (L) 12/06/2019 1802   BUN  13 12/06/2019 1802   BUN 15 06/26/2019 0908   CREATININE 1.19 (H) 12/06/2019 1802   CREATININE 0.81 01/15/2017 1026      Component Value Date/Time   CALCIUM 8.4 (L) 12/06/2019 1802   ALKPHOS 68 12/06/2019 1802   AST 44 (H) 12/06/2019 1802   ALT 25 12/06/2019 1802   BILITOT 1.5 (H) 12/06/2019 1802   BILITOT 0.8 06/26/2019 0908        Radiologic Imaging: As per HPI    Assessment:  Cindy Trujillo is a 64 y.o. 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   Anemia  Elevated LFTs with elevated bilirubin  Hypocalcemia  Hot flashes  Urinary urgency  Medical co-morbidities complicating care: DVT on anti-coagulation, GERD, diabetes, prior abdominal surgery and immunocompromised. Body mass index is 38.55 kg/m.  Plan:   Problem List Items Addressed This Visit      Genitourinary   Endometrial cancer ([place]) - Primary     Other   Elevated bilirubin   Elevated LFTs   Hypocalcemia   Normocytic anemia     Excellent postop recovery. The incision has closed completely with no current evidence of infection.   I have recommended continued close follow up with exams, including pelvic exams every 6 months for 5 years and then annually thereafter.  Imaging and laboratory assessment is based on clinical indication. Patient education for obesity, lifestyle, exercise, nutrition. She is not sexually active so we did not review sexual health, vaginal lubricants. We discussed her weight and need for weight loss, stressed good nutrition, and exercise.  I provided information regarding calorie counting and exercise for weight loss.  We will begin alternating with Dr. Gilman Schmidt after her next appointment.   Repeat labs to follow up anemia, elevated LFTs/bili and low calcium.  Hot  flashes, I suspect these will resolve. It may be that her ovaries were providing some hormonal levels. If her symptoms worsens we can obtain TSH.   Urinary symptoms. She does not have other symptoms of a UTI. If she does she can contact us and we can obtain urine studies. In the interim counseled about Kegel exercises and frequent urination. If she has continued issues plan for bladder training and Urology consult.    Recommended continued follow up with Dr. Tasia Catchings.   The patient's diagnosis, an outline of the further diagnostic and laboratory studies which will be required, the recommendation, and alternatives were discussed.  All questions were answered to the patient's satisfaction.  A total of over 25 minutes were spent with the patient/family today; >50% was spent in education, counseling and coordination of care for endometrial cancer and lab abnormalities.    [place] Gaetana Michaelis, MD    CC:  Dr. Adrian Prows

## 2020-01-24 NOTE — Patient Instructions (Signed)
Lab Results  Component Value Date   WBC 5.1 12/25/2019   HGB 11.1 (L) 12/25/2019   HCT 34.6 (L) 12/25/2019   MCV 81.4 12/25/2019   PLT 123 (L) 12/25/2019     Chemistry      Component Value Date/Time   NA 137 12/06/2019 1802   NA 141 06/26/2019 0908   K 4.5 12/06/2019 1802   CL 107 12/06/2019 1802   CO2 21 (L) 12/06/2019 1802   BUN 13 12/06/2019 1802   BUN 15 06/26/2019 0908   CREATININE 1.19 (H) 12/06/2019 1802   CREATININE 0.81 01/15/2017 1026      Component Value Date/Time   CALCIUM 8.4 (L) 12/06/2019 1802   ALKPHOS 68 12/06/2019 1802   AST 44 (H) 12/06/2019 1802   ALT 25 12/06/2019 1802   BILITOT 1.5 (H) 12/06/2019 1802   BILITOT 0.8 06/26/2019 0908        Radiologic Imaging: As per HPI    Assessment:  Cindy Trujillo is a 64 y.o. 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   Anemia  Elevated LFTs with elevated bilirubin  Hypocalcemia   Medical co-morbidities complicating care: DVT on anti-coagulation, GERD, diabetes, prior abdominal surgery and immunocompromised. Body mass index is 38.55 kg/m.  Plan:   Problem List Items Addressed This Visit      Genitourinary   Endometrial cancer ([place]) - Primary     Other   Elevated bilirubin   Elevated LFTs   Hypocalcemia   Normocytic anemia      Recommended continued close follow up with exams, including pelvic exams every 6 months for 5 years and then annually thereafter.  Imaging and laboratory assessment is based on clinical indication. Patient education for obesity, lifestyle, exercise, nutrition.     Calorie Counting for Weight Loss Calories are units of energy. Your body needs a certain amount of calories from food to keep you going throughout the day. When you eat more calories than  your body needs, your body stores the extra calories as fat. When you eat fewer calories than your body needs, your body burns fat to get the energy it needs. Calorie counting means keeping track of how many calories you eat and drink each day. Calorie counting can be helpful if you need to lose weight. If you make sure to eat fewer calories than your body needs, you should lose weight. Ask your health care provider what a healthy weight is for you. For calorie counting to work, you will need to eat the right number of calories in a day in order to lose a healthy amount of weight per week. A dietitian can help you determine how many calories you need in a day and will give you suggestions on how to reach your calorie goal.  A healthy amount of weight to lose per week is usually 1-2 lb (0.5-0.9 kg). This usually means that your daily calorie intake should be reduced by 500-750 calories.  Eating 1,200 - 1,500 calories per day can help most women lose weight.  Eating 1,500 - 1,800 calories per day can help most men lose weight. What is my plan? My goal is to have __________ calories per day. If I have this many calories per day, I should lose around __________ pounds per week. What do I need to know about calorie counting?  In order to meet your daily calorie goal, you will need to:  Find out how many calories are in each food you would like to eat. Try to do this before you eat.  Decide how much of the food you plan to eat.  Write down what you ate and how many calories it had. Doing this is called keeping a food log. To successfully lose weight, it is important to balance calorie counting with a healthy lifestyle that includes regular activity. Aim for 150 minutes of moderate exercise (such as walking) or 75 minutes of vigorous exercise (such as running) each week. Where do I find calorie information?  The number of calories in a food can be found on a Nutrition Facts label. If a food does not  have a Nutrition Facts label, try to look up the calories online or ask your dietitian for help. Remember that calories are listed per serving. If you choose to have more than one serving of a food, you will have to multiply the calories per serving by the amount of servings you plan to eat. For example, the label on a package of bread might say that a serving size is 1 slice and that there are 90 calories in a serving. If you eat 1 slice, you will have eaten 90 calories. If you eat 2 slices, you will have eaten 180 calories. How do I keep a food log? Immediately after each meal, record the following information in your food log:  What you ate. Don't forget to include toppings, sauces, and other extras on the food.  How much you ate. This can be measured in cups, ounces, or number of items.  How many calories each food and drink had.  The total number of calories in the meal. Keep your food log near you, such as in a small notebook in your pocket, or use a mobile app or website. Some programs will calculate calories for you and show you how many calories you have left for the day to meet your goal. What are some calorie counting tips?   Use your calories on foods and drinks that will fill you up and not leave you hungry: ? Some examples of foods that fill you up are nuts and nut butters, vegetables, lean proteins, and high-fiber foods like whole grains. High-fiber foods are foods with more than 5 g fiber per serving. ? Drinks such as sodas, specialty coffee drinks, alcohol, and juices have a lot of calories, yet do not fill you up.  Eat nutritious foods and avoid empty calories. Empty calories are calories you get from foods or beverages that do not have many vitamins or protein, such as candy, sweets, and soda. It is better to have a nutritious high-calorie food (such as an avocado) than a food with few nutrients (such as a bag of chips).  Know how many calories are in the foods you eat most  often. This will help you calculate calorie counts faster.  Pay attention to calories in drinks. Low-calorie drinks include water and unsweetened drinks.  Pay attention to nutrition labels for "low fat" or "fat free" foods. These foods sometimes have the same amount of calories or more calories than the full fat versions. They also often have added sugar, starch, or salt, to make up for flavor that was removed with the fat.  Find a way of tracking calories that works for you. Get creative. Try different apps or programs if writing down calories does not  work for you. What are some portion control tips?  Know how many calories are in a serving. This will help you know how many servings of a certain food you can have.  Use a measuring cup to measure serving sizes. You could also try weighing out portions on a kitchen scale. With time, you will be able to estimate serving sizes for some foods.  Take some time to put servings of different foods on your favorite plates, bowls, and cups so you know what a serving looks like.  Try not to eat straight from a bag or box. Doing this can lead to overeating. Put the amount you would like to eat in a cup or on a plate to make sure you are eating the right portion.  Use smaller plates, glasses, and bowls to prevent overeating.  Try not to multitask (for example, watch TV or use your computer) while eating. If it is time to eat, sit down at a table and enjoy your food. This will help you to know when you are full. It will also help you to be aware of what you are eating and how much you are eating. What are tips for following this plan? Reading food labels  Check the calorie count compared to the serving size. The serving size may be smaller than what you are used to eating.  Check the source of the calories. Make sure the food you are eating is high in vitamins and protein and low in saturated and trans fats. Shopping  Read nutrition labels while you  shop. This will help you make healthy decisions before you decide to purchase your food.  Make a grocery list and stick to it. Cooking  Try to cook your favorite foods in a healthier way. For example, try baking instead of frying.  Use low-fat dairy products. Meal planning  Use more fruits and vegetables. Half of your plate should be fruits and vegetables.  Include lean proteins like poultry and fish. How do I count calories when eating out?  Ask for smaller portion sizes.  Consider sharing an entree and sides instead of getting your own entree.  If you get your own entree, eat only half. Ask for a box at the beginning of your meal and put the rest of your entree in it so you are not tempted to eat it.  If calories are listed on the menu, choose the lower calorie options.  Choose dishes that include vegetables, fruits, whole grains, low-fat dairy products, and lean protein.  Choose items that are boiled, broiled, grilled, or steamed. Stay away from items that are buttered, battered, fried, or served with cream sauce. Items labeled "crispy" are usually fried, unless stated otherwise.  Choose water, low-fat milk, unsweetened iced tea, or other drinks without added sugar. If you want an alcoholic beverage, choose a lower calorie option such as a glass of wine or light beer.  Ask for dressings, sauces, and syrups on the side. These are usually high in calories, so you should limit the amount you eat.  If you want a salad, choose a garden salad and ask for grilled meats. Avoid extra toppings like bacon, cheese, or fried items. Ask for the dressing on the side, or ask for olive oil and vinegar or lemon to use as dressing.  Estimate how many servings of a food you are given. For example, a serving of cooked rice is  cup or about the size of half a baseball. Knowing serving  sizes will help you be aware of how much food you are eating at restaurants. The list below tells you how big or small  some common portion sizes are based on everyday objects: ? 1 oz--4 stacked dice. ? 3 oz--1 deck of cards. ? 1 tsp--1 die. ? 1 Tbsp-- a ping-pong ball. ? 2 Tbsp--1 ping-pong ball. ?  cup-- baseball. ? 1 cup--1 baseball. Summary  Calorie counting means keeping track of how many calories you eat and drink each day. If you eat fewer calories than your body needs, you should lose weight.  A healthy amount of weight to lose per week is usually 1-2 lb (0.5-0.9 kg). This usually means reducing your daily calorie intake by 500-750 calories.  The number of calories in a food can be found on a Nutrition Facts label. If a food does not have a Nutrition Facts label, try to look up the calories online or ask your dietitian for help.  Use your calories on foods and drinks that will fill you up, and not on foods and drinks that will leave you hungry.  Use smaller plates, glasses, and bowls to prevent overeating. This information is not intended to replace advice given to you by your health care provider. Make sure you discuss any questions you have with your health care provider. Document Revised: 12/17/2017 Document Reviewed: 02/28/2016 Elsevier Patient Education  2020 [employer].   Exercising to Lose Weight Exercise is structured, repetitive physical activity to improve fitness and health. Getting regular exercise is important for everyone. It is especially important if you are overweight. Being overweight increases your risk of heart disease, stroke, diabetes, high blood pressure, and several types of cancer. Reducing your calorie intake and exercising can help you lose weight. Exercise is usually categorized as moderate or vigorous intensity. To lose weight, most people need to do a certain amount of moderate-intensity or vigorous-intensity exercise each week. Moderate-intensity exercise  Moderate-intensity exercise is any activity that gets you moving enough to burn at least three times more  energy (calories) than if you were sitting. Examples of moderate exercise include:  Walking a mile in 15 minutes.  Doing light yard work.  Biking at an easy pace. Most people should get at least 150 minutes (2 hours and 30 minutes) a week of moderate-intensity exercise to maintain their body weight. Vigorous-intensity exercise Vigorous-intensity exercise is any activity that gets you moving enough to burn at least six times more calories than if you were sitting. When you exercise at this intensity, you should be working hard enough that you are not able to carry on a conversation. Examples of vigorous exercise include:  Running.  Playing a team sport, such as football, basketball, and soccer.  Jumping rope. Most people should get at least 75 minutes (1 hour and 15 minutes) a week of vigorous-intensity exercise to maintain their body weight. How can exercise affect me? When you exercise enough to burn more calories than you eat, you lose weight. Exercise also reduces body fat and builds muscle. The more muscle you have, the more calories you burn. Exercise also:  Improves mood.  Reduces stress and tension.  Improves your overall fitness, flexibility, and endurance.  Increases bone strength. The amount of exercise you need to lose weight depends on:  Your age.  The type of exercise.  Any health conditions you have.  Your overall physical ability. Talk to your health care provider about how much exercise you need and what types of activities are  safe for you. What actions can I take to lose weight? Nutrition   Make changes to your diet as told by your health care provider or diet and nutrition specialist (dietitian). This may include: ? Eating fewer calories. ? Eating more protein. ? Eating less unhealthy fats. ? Eating a diet that includes fresh fruits and vegetables, whole grains, low-fat dairy products, and lean protein. ? Avoiding foods with added fat, salt, and  sugar.  Drink plenty of water while you exercise to prevent dehydration or heat stroke. Activity  Choose an activity that you enjoy and set realistic goals. Your health care provider can help you make an exercise plan that works for you.  Exercise at a moderate or vigorous intensity most days of the week. ? The intensity of exercise may vary from person to person. You can tell how intense a workout is for you by paying attention to your breathing and heartbeat. Most people will notice their breathing and heartbeat get faster with more intense exercise.  Do resistance training twice each week, such as: ? Push-ups. ? Sit-ups. ? Lifting weights. ? Using resistance bands.  Getting short amounts of exercise can be just as helpful as long structured periods of exercise. If you have trouble finding time to exercise, try to include exercise in your daily routine. ? Get up, stretch, and walk around every 30 minutes throughout the day. ? Go for a walk during your lunch break. ? Park your car farther away from your destination. ? If you take public transportation, get off one stop early and walk the rest of the way. ? Make phone calls while standing up and walking around. ? Take the stairs instead of elevators or escalators.  Wear comfortable clothes and shoes with good support.  Do not exercise so much that you hurt yourself, feel dizzy, or get very short of breath. Where to find more information  [place] Department of Health and Human Services: [employer]at  Centers for Disease Control and Prevention (CDC): http://www.wolf.info/ Contact a health care provider:  Before starting a new exercise program.  If you have questions or concerns about your weight.  If you have a medical problem that keeps you from exercising. Get help right away if you have any of the following while exercising:  Injury.  Dizziness.  Difficulty breathing or shortness of breath that does not go away when you stop  exercising.  Chest pain.  Rapid heartbeat. Summary  Being overweight increases your risk of heart disease, stroke, diabetes, high blood pressure, and several types of cancer.  Losing weight happens when you burn more calories than you eat.  Reducing the amount of calories you eat in addition to getting regular moderate or vigorous exercise each week helps you lose weight. This information is not intended to replace advice given to you by your health care provider. Make sure you discuss any questions you have with your health care provider. Document Revised: 04/12/2017 Document Reviewed: 04/12/2017 Elsevier Patient Education  2020 [employer].

## 2020-02-08 ENCOUNTER — Encounter: Payer: Self-pay | Admitting: Vascular Surgery

## 2020-02-13 ENCOUNTER — Telehealth: Payer: Self-pay | Admitting: *Deleted

## 2020-02-13 ENCOUNTER — Other Ambulatory Visit: Payer: Self-pay | Admitting: *Deleted

## 2020-02-13 DIAGNOSIS — I83812 Varicose veins of left lower extremities with pain: Secondary | ICD-10-CM

## 2020-02-13 NOTE — Telephone Encounter (Signed)
Team, ok to stop Eliquis 2.45m BID 2 days prior to procedure, and resume 2 days after procedure.

## 2020-02-13 NOTE — Telephone Encounter (Signed)
Sonya Rankin from Dr C Dixon's office (vascular) called reporting that patient is to have an in office procedure 02/29/20 under Tumescent anesthesia and lidocaine. Plan is for an endovenous laser ablation and stab phlebectomies. Patient needs to be off of her Eliquis for 2 days prior to procedure, the day of procedure and 2 days post procedure. They are asking for clearance form Dr Tasia Catchings for this. Please return call [phone] Opt # 4

## 2020-02-14 NOTE — Telephone Encounter (Signed)
Detailed message with anticoagulation clearance approval left on Sonya's confidential VM. Notified her to fax clearance form if this was needed in writing or to contact our office for any questions.

## 2020-02-20 ENCOUNTER — Other Ambulatory Visit: Payer: Self-pay | Admitting: *Deleted

## 2020-02-20 MED ORDER — LORAZEPAM 1 MG PO TABS: ORAL_TABLET | ORAL | 0 refills | Status: DC

## 2020-02-29 ENCOUNTER — Other Ambulatory Visit: Payer: Self-pay

## 2020-02-29 ENCOUNTER — Encounter: Payer: Self-pay | Admitting: Vascular Surgery

## 2020-02-29 ENCOUNTER — Ambulatory Visit: Payer: 59 | Admitting: Vascular Surgery

## 2020-02-29 VITALS — BP 131/84 | HR 80 | Temp 97.5°F | Resp 16 | Ht 64.0 in | Wt 244.0 lb

## 2020-02-29 DIAGNOSIS — I872 Venous insufficiency (chronic) (peripheral): Secondary | ICD-10-CM

## 2020-02-29 DIAGNOSIS — I83812 Varicose veins of left lower extremities with pain: Secondary | ICD-10-CM

## 2020-02-29 DIAGNOSIS — M7989 Other specified soft tissue disorders: Secondary | ICD-10-CM

## 2020-02-29 HISTORY — PX: ENDOVENOUS ABLATION SAPHENOUS VEIN W/ LASER: SUR449

## 2020-02-29 NOTE — Progress Notes (Signed)
Patient name: Cindy Trujillo MRN: [id number] DOB: 15-Nov-1955 Sex: female  REASON FOR VISIT: For laser ablation of the left great saphenous vein with 10-20 stabs.  HPI: Cindy Trujillo is a 64 y.o. female who I saw in consultation on 11/16/2019.  She had undergone laser ablation of left great saphenous vein in [place] in January of this year.  Follow-up duplex scan showed successful vein closure but there was an additional vessel running parallel to the great saphenous vein.  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  She was continuing to have pain in the left leg which is worse at the end of the day.  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  She had CEAP C4c venous disease.  I felt that she could benefit from laser ablation of the duplicate system on the left with 10-20 stab phlebectomies.  She would also require 2 units of sclerotherapy.    Current Outpatient Medications  Medication Sig Dispense Refill  . acetaminophen (TYLENOL) 500 MG tablet Take 500 mg by mouth every 6 (six) hours as needed (for pain.).     Marland Kitchen apixaban (ELIQUIS) 5 MG TABS tablet Take 1 tablet (5 mg total) by mouth 2 (two) times daily. 60 tablet 0  . Blood Glucose Monitoring Suppl (FREESTYLE LITE) DEVI     . dapagliflozin propanediol (FARXIGA) 10 MG TABS tablet Take 10 mg by mouth daily. (Patient taking differently: Take 10 mg by mouth daily at 6 PM. ) 90 tablet 3  . ferrous sulfate 325 (65 FE) MG tablet Take 325 mg by mouth 2 (two) times a week.     Marland Kitchen FREESTYLE LITE test strip 1 each by Other route in the morning, at noon, in the evening, and at bedtime. 100 each 3  . FreeStyle Unistick II Lancets MISC Use as instructed to check blood glucose daily 100 each 5  . glucose blood (FREESTYLE TEST STRIPS) test strip Use as instructed to check blood glucose daily 100 each 12  . glucose blood test strip     . glucose monitoring kit (FREESTYLE) monitoring kit 1 each by Does not apply route as  needed for other. 1 each 0  . Lancets (FREESTYLE) lancets     . lisinopril (ZESTRIL) 5 MG tablet Take 1 tablet (5 mg total) by mouth daily. (Patient taking differently: Take 5 mg by mouth daily with supper. ) 90 tablet 3  . loratadine (CLARITIN) 10 MG tablet Take 1 tablet (10 mg total) by mouth daily. 90 tablet 3  . LORazepam (ATIVAN) 1 MG tablet Take 1 tablet 30 minutes prior to leaving house on day of office surgery and bring second tablet with you to office. 2 tablet 0  . metFORMIN (GLUCOPHAGE) 1000 MG tablet Take 1 tablet (1,000 mg total) by mouth 2 (two) times daily with a meal. 180 tablet 3  . metoCLOPramide (REGLAN) 5 MG tablet Take 1 tablet (5 mg total) by mouth 4 (four) times daily -  before meals and at bedtime. 28 tablet 1  . Multiple Vitamin (MULTIVITAMIN WITH MINERALS) TABS tablet Take 1 tablet by mouth 2 (two) times a week.    Marland Kitchen omeprazole (PRILOSEC) 20 MG capsule TAKE 1 CAPSULE (20 MG TOTAL) BY MOUTH DAILY. 90 capsule 3  . rosuvastatin (CRESTOR) 5 MG tablet Take 1 tablet (5 mg total) by mouth daily. (Patient taking differently: Take 5 mg by mouth every evening. ) 90 tablet 3  .  simethicone (MYLICON) 80 MG chewable tablet Chew 1 tablet (80 mg total) by mouth 4 (four) times daily. 30 tablet 1  . vitamin B-12 (CYANOCOBALAMIN) 1000 MCG tablet Take 1 tablet (1,000 mcg total) by mouth daily. (Patient taking differently: Take 1,000 mcg by mouth every evening. ) 90 tablet 1   No current facility-administered medications for this visit.    PHYSICAL EXAM: Vitals:   02/29/20 1120  BP: 131/84  Pulse: 80  Resp: 16  Temp: (!) 97.5 F (36.4 C)  TempSrc: Temporal  SpO2: 99%  Weight: 244 lb (110.7 kg)  Height: _0  (1.626 m)    PROCEDURE: Endovenous laser ablation left great saphenous vein with 10-20 stabs  TECHNIQUE: The patient was taken to the exam room and her dilated varicose veins were marked with the patient standing.  The patient was then placed supine.  I looked at the  duplicate left great saphenous vein with the SonoSite and I felt that I could cannulate this in the distal thigh.  The left leg was prepped and draped in usual sterile fashion.  Under ultrasound guidance, after the skin was anesthetized, I cannulated the left great saphenous vein in the distal thigh with a micropuncture needle and a micropuncture sheath was introduced over the wire.  I then advanced the J-wire to below the saphenofemoral junction.  A 45 cm sheath was advanced over the wire and the wire and dilator were removed.  The laser fiber was positioned at the end of the sheath and then the sheath retracted.  I positioned the laser fiber approximately 2.5 cm distal to the saphenofemoral junction.  Tumescent anesthesia was then administered circumferentially around the vein.  Next the patient was placed in Trendelenburg.  Laser glasses were placed.  Laser ablation was performed of the duplicate left great saphenous vein from 2-1/2 cm distal to the saphenofemoral junction to the distal thigh.  The vein was treated with 50 J/cm and 7 W.  Next tumescent anesthesia was administered in all the marked varicose veins.  Using approximately 12 small stab incisions the vein was hooked brought above the skin grasped with a hemostat and then bluntly excised.  Pressure was held for hemostasis.  Steri-Strips were then applied and a pressure dressing applied.  The patient tolerated the procedure well.  She will return in 2 weeks for a follow-up duplex.  Deitra Mayo [facility] [phone]

## 2020-02-29 NOTE — Progress Notes (Signed)
     Laser Ablation Procedure    Date: 02/29/2020   Cindy Trujillo DOB:May 31, 1955  Consent signed: Yes      Surgeon: Gae Gallop MD   Procedure: Laser Ablation: left Greater Saphenous Vein  BP 131/84 (BP Location: Right Arm, Patient Position: Sitting, Cuff Size: Normal)   Pulse 80   Temp (!) 97.5 F (36.4 C) (Temporal)   Resp 16   Ht 5' 4"  (1.626 m)   Wt 244 lb (110.7 kg)   SpO2 99%   BMI 41.88 kg/m   Tumescent Anesthesia: 400 cc 0.9% NaCl with 50 cc Lidocaine HCL 1%  and 15 cc 8.4% NaHCO3  Local Anesthesia: 3 cc Lidocaine HCL and NaHCO3 (ratio 2:1)  7 watts continuous mode     Total energy: 1194 Joules    Total time: 170 seconds Treatment Length  29 cm  Laser Fiber Ref. #  [id number]   Lot # [id number]   Stab Phlebectomy: 10-20 Sites: Thigh and Calf  Patient tolerated procedure well  Notes: Patient wore face mask.  All staff members wore facial masks and facial shields/goggles.  Last dose of Eliquis taken 02-26-2020.  Ativan 1 mg taken on 02-29-2020 at 9:30 AM and 10:15 AM.    Description of Procedure:  After marking the course of the secondary varicosities, the patient was placed on the operating table in the supine position, and the left leg was prepped and draped in sterile fashion.   Local anesthetic was administered and under ultrasound guidance the saphenous vein was accessed with a micro needle and guide wire; then the mirco puncture sheath was placed.  A guide wire was inserted saphenofemoral junction , followed by a 5 french sheath.  The position of the sheath and then the laser fiber below the junction was confirmed using the ultrasound.  Tumescent anesthesia was administered along the course of the saphenous vein using ultrasound guidance. The patient was placed in Trendelenburg position and protective laser glasses were placed on patient and staff, and the laser was fired at 7 watts continuous mode for a total of 1194 joules.   For stab phlebectomies, local  anesthetic was administered at the previously marked varicosities, and tumescent anesthesia was administered around the vessels.  Ten to 20 stab wounds were made using the tip of an 11 blade. And using the vein hook, the phlebectomies were performed using a hemostat to avulse the varicosities.  Adequate hemostasis was achieved.     Steri strips were applied to the stab wounds and ABD pads and thigh high compression stockings were applied.  Ace wrap bandages were applied over the phlebectomy sites and at the top of the saphenofemoral junction. Blood loss was less than 15 cc.  Discharge instructions reviewed with patient and hardcopy of discharge instructions given to patient to take home. The patient ambulated out of the operating room having tolerated the procedure well.

## 2020-03-14 ENCOUNTER — Other Ambulatory Visit: Payer: Self-pay

## 2020-03-14 ENCOUNTER — Ambulatory Visit (INDEPENDENT_AMBULATORY_CARE_PROVIDER_SITE_OTHER): Payer: Self-pay | Admitting: Vascular Surgery

## 2020-03-14 ENCOUNTER — Encounter: Payer: Self-pay | Admitting: Vascular Surgery

## 2020-03-14 ENCOUNTER — Ambulatory Visit (HOSPITAL_COMMUNITY)
Admission: RE | Admit: 2020-03-14 | Discharge: 2020-03-14 | Disposition: A | Discharge status: Home / Self Care | Payer: 59 | Source: Ambulatory Visit | Attending: Vascular Surgery | Admitting: Vascular Surgery

## 2020-03-14 VITALS — BP 141/71 | HR 66 | Temp 97.5°F | Resp 18 | Ht 64.0 in | Wt 244.0 lb

## 2020-03-14 DIAGNOSIS — Z48812 Encounter for surgical aftercare following surgery on the circulatory system: Secondary | ICD-10-CM

## 2020-03-14 DIAGNOSIS — I83812 Varicose veins of left lower extremities with pain: Secondary | ICD-10-CM

## 2020-03-14 DIAGNOSIS — I8393 Asymptomatic varicose veins of bilateral lower extremities: Secondary | ICD-10-CM

## 2020-03-14 NOTE — Progress Notes (Signed)
Patient name: JENAVI BEEDLE MRN: [id number] DOB: 1955/07/25 Sex: female  REASON FOR VISIT: Follow-up after laser ablation of the left great saphenous vein with 10-20 stab phlebectomies.  HPI: IRISHA GRANDMAISON is a 64 y.o. female who had previously undergone laser ablation of the left great saphenous vein in [place] in January of this year.  A follow-up duplex scan showed successful vein closure but there was an additional saphenous vein running parallel to the ablated vein.  She was continuing to have pain in the left leg and had failed conservative treatment.  She had CEAP C4c venous disease.  On 02/29/2020 she underwent laser ablation of the left great saphenous vein from 2 and half centimeters distal to the saphenofemoral junction to the distal thigh.  She comes in for a 2-week follow-up visit.  Today she has no specific complaints.  She has been resuming her normal activities.  She has been wearing her thigh-high stockings and plans to continue wearing these as these do help her legs feel better.  Of note we also planned sclerotherapy subsequently.    Current Outpatient Medications  Medication Sig Dispense Refill  . acetaminophen (TYLENOL) 500 MG tablet Take 500 mg by mouth every 6 (six) hours as needed (for pain.).     Marland Kitchen apixaban (ELIQUIS) 5 MG TABS tablet Take 1 tablet (5 mg total) by mouth 2 (two) times daily. 60 tablet 0  . Blood Glucose Monitoring Suppl (FREESTYLE LITE) DEVI     . dapagliflozin propanediol (FARXIGA) 10 MG TABS tablet Take 10 mg by mouth daily. (Patient taking differently: Take 10 mg by mouth daily at 6 PM. ) 90 tablet 3  . ferrous sulfate 325 (65 FE) MG tablet Take 325 mg by mouth 2 (two) times a week.     Marland Kitchen FREESTYLE LITE test strip 1 each by Other route in the morning, at noon, in the evening, and at bedtime. 100 each 3  . FreeStyle Unistick II Lancets MISC Use as instructed to check blood glucose daily 100 each 5  . glucose blood (FREESTYLE TEST STRIPS) test  strip Use as instructed to check blood glucose daily 100 each 12  . glucose blood test strip     . glucose monitoring kit (FREESTYLE) monitoring kit 1 each by Does not apply route as needed for other. 1 each 0  . Lancets (FREESTYLE) lancets     . lisinopril (ZESTRIL) 5 MG tablet Take 1 tablet (5 mg total) by mouth daily. (Patient taking differently: Take 5 mg by mouth daily with supper. ) 90 tablet 3  . loratadine (CLARITIN) 10 MG tablet Take 1 tablet (10 mg total) by mouth daily. 90 tablet 3  . metFORMIN (GLUCOPHAGE) 1000 MG tablet Take 1 tablet (1,000 mg total) by mouth 2 (two) times daily with a meal. 180 tablet 3  . Multiple Vitamin (MULTIVITAMIN WITH MINERALS) TABS tablet Take 1 tablet by mouth 2 (two) times a week.    Marland Kitchen omeprazole (PRILOSEC) 20 MG capsule TAKE 1 CAPSULE (20 MG TOTAL) BY MOUTH DAILY. 90 capsule 3  . rosuvastatin (CRESTOR) 5 MG tablet Take 1 tablet (5 mg total) by mouth daily. (Patient taking differently: Take 5 mg by mouth every evening. ) 90 tablet 3  . vitamin B-12 (CYANOCOBALAMIN) 1000 MCG tablet Take 1 tablet (1,000 mcg total) by mouth daily. (Patient taking differently: Take 1,000 mcg by mouth every evening. ) 90 tablet 1  . LORazepam (ATIVAN) 1 MG tablet Take 1 tablet 30 minutes prior to  leaving house on day of office surgery and bring second tablet with you to office. 2 tablet 0  . metoCLOPramide (REGLAN) 5 MG tablet Take 1 tablet (5 mg total) by mouth 4 (four) times daily -  before meals and at bedtime. 28 tablet 1  . simethicone (MYLICON) 80 MG chewable tablet Chew 1 tablet (80 mg total) by mouth 4 (four) times daily. 30 tablet 1   No current facility-administered medications for this visit.   REVIEW OF SYSTEMS: Valu.Nieves ] denotes positive finding; [  ] denotes negative finding  CARDIOVASCULAR:  [ ] chest pain   [ ] dyspnea on exertion  [ ] leg swelling  CONSTITUTIONAL:  [ ] fever   [ ] chills  PHYSICAL EXAM: Vitals:   03/14/20 1028  BP: (!) 141/71  Pulse: 66  Resp:  18  Temp: (!) 97.5 F (36.4 C)  TempSrc: Temporal  SpO2: 100%  Weight: 244 lb (110.7 kg)  Height: 5' 4" (1.626 m)   GENERAL: The patient is a well-nourished female, in no acute distress. The vital signs are documented above. CARDIOVASCULAR: There is a regular rate and rhythm. PULMONARY: There is good air exchange bilaterally without wheezing or rales. VASCULAR: She has some mild bruising in the medial left calf.  Thigh looks fine.  She has no significant leg swelling.  DATA:  VENOUS DUPLEX: I have independently interpreted her venous duplex scan today.  There is no evidence of DVT in the left lower extremity.  The left great saphenous vein is successfully closed from 2.7 cm distal to the saphenofemoral junction to the proximal calf.  MEDICAL ISSUES:  S/P LASER ABLATION OF LEFT GREAT SAPHENOUS VEIN AND 10-20 STABS: Patient is doing well status post laser ablation of the left great saphenous vein and 10-20 stab phlebectomies.  She has now worn her thigh-high stockings for 2 weeks.  Her duplex scan looks good.  She will continue to wear her thigh-high compression stockings as this helps her symptoms.  She also elevates her legs.  I will see her back as needed.  Patient was approved for sclerotherapy and I have given her Estell Harpin, RN's card so that she can schedule this.  Deitra Mayo [facility] [phone]

## 2020-03-15 ENCOUNTER — Telehealth: Payer: Self-pay

## 2020-03-15 NOTE — Telephone Encounter (Signed)
Hematology manages this patient's Eliquis, I think Dr. Tasia Catchings should be asked this question.

## 2020-03-15 NOTE — Telephone Encounter (Addendum)
[facility] Gala [facility] is doing Sclerotherapy on this patient 03/26/20. She needs to hold Eliquis two days prior. Please let Izora Gala know thru Epic if this is ok. She will contact the patient.

## 2020-03-18 ENCOUNTER — Telehealth: Payer: Self-pay | Admitting: *Deleted

## 2020-03-18 NOTE — Telephone Encounter (Signed)
Ok to hold Eliquis 2.31m BID for 2 days and resume 1 day after

## 2020-03-18 NOTE — Telephone Encounter (Signed)
The procedure is scheduled for next Monday (03/25/20).

## 2020-03-18 NOTE — Telephone Encounter (Signed)
Notified [facility] Gala [facility].

## 2020-03-18 NOTE — Telephone Encounter (Signed)
Cindy Trujillo with [facility] called asking if patient can hold her Eliquis for 2 days prior to sclerotherapy. Please return her call [phone]

## 2020-03-19 ENCOUNTER — Other Ambulatory Visit: Payer: Self-pay | Admitting: Family Medicine

## 2020-03-19 DIAGNOSIS — I82411 Acute embolism and thrombosis of right femoral vein: Secondary | ICD-10-CM

## 2020-03-19 NOTE — Telephone Encounter (Signed)
Detailed message left on Cindy Trujillo's secure voicemail.

## 2020-03-19 NOTE — Telephone Encounter (Signed)
  Notes to clinic:  medication filled by a different provider Review for refill    Requested Prescriptions  Pending Prescriptions Disp Refills   ELIQUIS 5 MG TABS tablet [Pharmacy Med Name: ELIQUIS 5 MG TABLET 5 Tablet] 60 tablet 3    Sig: TAKE 1 TABLET BY MOUTH TWO TIMES DAILY      Hematology:  Anticoagulants Failed - 03/19/2020  7:52 AM      Failed - HGB in normal range and within 360 days    Hemoglobin  Date Value Ref Range Status  01/24/2020 11.2 (L) 12.0 - 15.0 g/dL Final  09/21/2019 11.5 11.1 - 15.9 g/dL Final          Failed - PLT in normal range and within 360 days    Platelets  Date Value Ref Range Status  01/24/2020 89 (L) 150 - 400 K/uL Final  09/21/2019 97 (LL) 150 - 450 x10E3/uL Final          Failed - HCT in normal range and within 360 days    HCT  Date Value Ref Range Status  01/24/2020 34.2 (L) 36 - 46 % Final   Hematocrit  Date Value Ref Range Status  09/21/2019 34.4 34.0 - 46.6 % Final          Failed - Cr in normal range and within 360 days    Creat  Date Value Ref Range Status  01/15/2017 0.81 0.50 - 1.05 mg/dL Final    Comment:    For patients >70 years of age, the reference limit for Creatinine is approximately 13% higher for people identified as African-American. .    Creatinine, Ser  Date Value Ref Range Status  01/24/2020 1.16 (H) 0.44 - 1.00 mg/dL Final          Passed - Valid encounter within last 12 months    Recent Outpatient Visits           2 months ago Type 2 diabetes mellitus with microalbuminuria, without long-term current use of insulin [facility])   [facility], Dionne Bucy, MD   6 months ago Annual physical exam   [facility], Dionne Bucy, MD   8 months ago Type 2 diabetes mellitus with microalbuminuria, without long-term current use of insulin [facility])   [facility], Dionne Bucy, MD   10 months ago Need for shingles vaccine   [facility], Dionne Bucy, MD   1 year ago Need for shingles vaccine   [facility], Dionne Bucy, MD       Future Appointments             In 1 week Bacigalupo, Dionne Bucy, MD [facility] [place]

## 2020-03-26 ENCOUNTER — Other Ambulatory Visit: Payer: Self-pay

## 2020-03-26 ENCOUNTER — Ambulatory Visit (INDEPENDENT_AMBULATORY_CARE_PROVIDER_SITE_OTHER): Payer: 59

## 2020-03-26 DIAGNOSIS — I83813 Varicose veins of bilateral lower extremities with pain: Secondary | ICD-10-CM

## 2020-03-26 NOTE — Progress Notes (Signed)
Treated pt's small reticular and spider veins on L LE with Aslcera 1% administered with a 27g butterfly.  Patient received a total of 4 mL. Pt tolerated well. Easy access. Anticipate good results. Pt had some soreness from stabs/laser ablation with some firmness to 2 areas where stabs were. She feels these are improving gradually. Pt held Eliquis for 2 days prior and will resume tomorrow, per Dr. Tasia Catchings. Pt was given after treatment care instructions both verbally and on handout. Will follow PRN.    Photos: Yes.    Compression stockings applied: Yes.

## 2020-03-28 ENCOUNTER — Other Ambulatory Visit: Payer: Self-pay | Admitting: Family Medicine

## 2020-03-28 ENCOUNTER — Other Ambulatory Visit: Payer: Self-pay

## 2020-03-28 ENCOUNTER — Ambulatory Visit: Payer: 59 | Admitting: Family Medicine

## 2020-03-28 ENCOUNTER — Encounter: Payer: Self-pay | Admitting: Family Medicine

## 2020-03-28 VITALS — BP 116/76 | HR 76 | Temp 98.4°F | Resp 16 | Ht 64.0 in | Wt 222.7 lb

## 2020-03-28 DIAGNOSIS — D696 Thrombocytopenia, unspecified: Secondary | ICD-10-CM | POA: Diagnosis not present

## 2020-03-28 DIAGNOSIS — R809 Proteinuria, unspecified: Secondary | ICD-10-CM

## 2020-03-28 DIAGNOSIS — E1129 Type 2 diabetes mellitus with other diabetic kidney complication: Secondary | ICD-10-CM | POA: Diagnosis not present

## 2020-03-28 DIAGNOSIS — I82411 Acute embolism and thrombosis of right femoral vein: Secondary | ICD-10-CM

## 2020-03-28 LAB — POCT GLYCOSYLATED HEMOGLOBIN (HGB A1C)
Est. average glucose Bld gHb Est-mCnc: 151
Hemoglobin A1C: 6.9 % — AB (ref 4.0–5.6)

## 2020-03-28 MED ORDER — DAPAGLIFLOZIN PROPANEDIOL 10 MG PO TABS
10.0000 mg | ORAL_TABLET | Freq: Every day | ORAL | 3 refills | Status: DC
Start: 2020-03-28 — End: 2021-03-28
  Filled 2020-09-16: qty 90, 90d supply, fill #0
  Filled 2020-12-08: qty 90, 90d supply, fill #1
  Filled 2021-03-16: qty 90, 90d supply, fill #2

## 2020-03-28 MED ORDER — LORATADINE 10 MG PO TABS
10.0000 mg | ORAL_TABLET | Freq: Every day | ORAL | 3 refills | Status: DC
Start: 2020-03-28 — End: 2021-03-28

## 2020-03-28 MED ORDER — METFORMIN HCL 1000 MG PO TABS
1000.0000 mg | ORAL_TABLET | Freq: Two times a day (BID) | ORAL | 3 refills | Status: DC
Start: 2020-03-28 — End: 2020-03-28

## 2020-03-28 MED ORDER — ROSUVASTATIN CALCIUM 5 MG PO TABS
5.0000 mg | ORAL_TABLET | Freq: Every day | ORAL | 3 refills | Status: DC
Start: 2020-03-28 — End: 2021-03-28
  Filled 2020-12-18: qty 90, 90d supply, fill #0
  Filled 2021-03-16: qty 90, 90d supply, fill #1

## 2020-03-28 MED ORDER — APIXABAN 2.5 MG PO TABS: 2.5000 mg | ORAL_TABLET | Freq: Two times a day (BID) | ORAL | 1 refills | Status: DC

## 2020-03-28 MED ORDER — LISINOPRIL 5 MG PO TABS: 5.0000 mg | ORAL_TABLET | Freq: Every day | ORAL | 3 refills | Status: DC

## 2020-03-28 MED ORDER — OMEPRAZOLE 20 MG PO CPDR
20.0000 mg | DELAYED_RELEASE_CAPSULE | Freq: Every day | ORAL | 3 refills | Status: DC
Start: 2020-03-28 — End: 2021-03-28
  Filled 2020-09-16: qty 90, 90d supply, fill #0
  Filled 2020-12-18: qty 90, 90d supply, fill #1
  Filled 2021-03-16: qty 90, 90d supply, fill #2

## 2020-03-28 NOTE — Assessment & Plan Note (Signed)
History of low platelets Diagnosis of heterozygous Factor V Leiden Plts 84 in October, low side of normal for her Recheck CBC today

## 2020-03-28 NOTE — Assessment & Plan Note (Signed)
Followed by vascular surgery. S/p sclerotherapy of varicose veins Continue anticoagulation

## 2020-03-28 NOTE — Assessment & Plan Note (Signed)
Chronic and improving, A1c down to 6.9 from 7.7 3 months ago No changes in medications today No side effects from dapagaflozin or metformin UTD on screenings On Statin  F/u 6 months

## 2020-03-28 NOTE — Progress Notes (Signed)
Established patient visit   Patient: Cindy Trujillo   DOB: 24-Apr-1955   64 y.o. Female  MRN: [id number] Visit Date: 03/28/2020  Today's healthcare provider: Lavon Paganini, MD   Chief Complaint  Patient presents with  . Diabetes   Subjective    HPI   Cindy Trujillo is doing well overall. She has no complaints today. She had been compliant with all of her medications. She is experiencing no side effects. She does endorse some consistent pain in her legs after her sclerotherapy a week ago, and she feels the tylenol provides minimal benefit for it.     Social History   Tobacco Use  . Smoking status: Never Smoker  . Smokeless tobacco: Never Used  Vaping Use  . Vaping Use: Never used  Substance Use Topics  . Alcohol use: No    Alcohol/week: 0.0 standard drinks  . Drug use: No       Medications: Outpatient Medications Prior to Visit  Medication Sig  . acetaminophen (TYLENOL) 500 MG tablet Take 500 mg by mouth every 6 (six) hours as needed (for pain.).   Marland Kitchen Blood Glucose Monitoring Suppl (FREESTYLE LITE) DEVI   . ferrous sulfate 325 (65 FE) MG tablet Take 325 mg by mouth 2 (two) times a week.   Marland Kitchen FREESTYLE LITE test strip 1 each by Other route in the morning, at noon, in the evening, and at bedtime.  . FreeStyle Unistick II Lancets MISC Use as instructed to check blood glucose daily  . glucose blood (FREESTYLE TEST STRIPS) test strip Use as instructed to check blood glucose daily  . glucose blood test strip   . glucose monitoring kit (FREESTYLE) monitoring kit 1 each by Does not apply route as needed for other.  . Lancets (FREESTYLE) lancets   . Multiple Vitamin (MULTIVITAMIN WITH MINERALS) TABS tablet Take 1 tablet by mouth 2 (two) times a week.  . vitamin B-12 (CYANOCOBALAMIN) 1000 MCG tablet Take 1 tablet (1,000 mcg total) by mouth daily. (Patient taking differently: Take 1,000 mcg by mouth every evening.)  . [DISCONTINUED] dapagliflozin propanediol (FARXIGA) 10 MG  TABS tablet Take 10 mg by mouth daily. (Patient taking differently: Take 10 mg by mouth daily at 6 PM.)  . [DISCONTINUED] ELIQUIS 5 MG TABS tablet TAKE 1 TABLET BY MOUTH TWO TIMES DAILY  . [DISCONTINUED] lisinopril (ZESTRIL) 5 MG tablet Take 1 tablet (5 mg total) by mouth daily. (Patient taking differently: Take 5 mg by mouth daily with supper.)  . [DISCONTINUED] loratadine (CLARITIN) 10 MG tablet Take 1 tablet (10 mg total) by mouth daily.  . [DISCONTINUED] metFORMIN (GLUCOPHAGE) 1000 MG tablet Take 1 tablet (1,000 mg total) by mouth 2 (two) times daily with a meal.  . [DISCONTINUED] omeprazole (PRILOSEC) 20 MG capsule TAKE 1 CAPSULE (20 MG TOTAL) BY MOUTH DAILY.  . [DISCONTINUED] rosuvastatin (CRESTOR) 5 MG tablet Take 1 tablet (5 mg total) by mouth daily. (Patient taking differently: Take 5 mg by mouth every evening.)  . [DISCONTINUED] LORazepam (ATIVAN) 1 MG tablet Take 1 tablet 30 minutes prior to leaving house on day of office surgery and bring second tablet with you to office.  . [DISCONTINUED] metoCLOPramide (REGLAN) 5 MG tablet Take 1 tablet (5 mg total) by mouth 4 (four) times daily -  before meals and at bedtime.  . [DISCONTINUED] simethicone (MYLICON) 80 MG chewable tablet Chew 1 tablet (80 mg total) by mouth 4 (four) times daily.   No facility-administered medications prior to visit.  Review of Systems  Constitutional: Negative.   HENT: Negative.   Eyes: Negative.   Respiratory: Negative.   Cardiovascular: Negative.   Gastrointestinal: Negative.   Endocrine: Negative.   Genitourinary: Negative.   Musculoskeletal: Negative.   Skin: Negative.   Allergic/Immunologic: Negative.   Neurological: Negative.   Hematological: Negative.   Psychiatric/Behavioral: Negative.       Objective    BP 116/76 (BP Location: Left Arm, Patient Position: Sitting, Cuff Size: Normal)   Pulse 76   Temp 98.4 F (36.9 C) (Oral)   Resp 16   Ht _0  (1.626 m)   Wt 222 lb 11.2 oz (101 kg)    SpO2 98%   BMI 38.23 kg/m    Physical Exam   General: Well appearing in NAD Lungs: CTA bilaterally Cards: RRR no murmurs rubs or gallops GI: Soft Non-tender  Results for orders placed or performed in visit on 03/28/20  POCT glycosylated hemoglobin (Hb A1C)  Result Value Ref Range   Hemoglobin A1C 6.9 (A) 4.0 - 5.6 %   Est. average glucose Bld gHb Est-mCnc 151     Assessment & Plan     Problem List Items Addressed This Visit      Cardiovascular and Mediastinum   Acute deep vein thrombosis (DVT) of femoral vein of right lower extremity (HCC)    Followed by vascular surgery. S/p sclerotherapy of varicose veins Continue anticoagulation      Relevant Medications   apixaban (ELIQUIS) 2.5 MG TABS tablet   lisinopril (ZESTRIL) 5 MG tablet   rosuvastatin (CRESTOR) 5 MG tablet     Endocrine   T2DM (type 2 diabetes mellitus) (HCC) - Primary    Chronic and improving, A1c down to 6.9 from 7.7 3 months ago No changes in medications today No side effects from dapagaflozin or metformin UTD on screenings On Statin  F/u 6 months      Relevant Medications   dapagliflozin propanediol (FARXIGA) 10 MG TABS tablet   lisinopril (ZESTRIL) 5 MG tablet   metFORMIN (GLUCOPHAGE) 1000 MG tablet   rosuvastatin (CRESTOR) 5 MG tablet   Other Relevant Orders   POCT glycosylated hemoglobin (Hb A1C) (Completed)   Microalbuminuria due to type 2 diabetes mellitus (HCC)   Relevant Medications   dapagliflozin propanediol (FARXIGA) 10 MG TABS tablet   lisinopril (ZESTRIL) 5 MG tablet   metFORMIN (GLUCOPHAGE) 1000 MG tablet   rosuvastatin (CRESTOR) 5 MG tablet     Other   Thrombocytopenia (HCC)    History of low platelets Diagnosis of heterozygous Factor V Leiden Plts 84 in October, low side of normal for her Recheck CBC today      Relevant Orders   CBC       Return in about 6 months (around 09/26/2020) for CPE (after 6/10).      Patient seen along with MS[employer] student [facility] I  personally evaluated this patient along with the student, and verified all aspects of the history, physical exam, and medical decision making as documented by the student. I agree with the student's documentation and have made all necessary edits.  Lalena Salas, Dionne Bucy, MD, MPH [facility]

## 2020-03-29 ENCOUNTER — Telehealth: Payer: Self-pay

## 2020-03-29 LAB — CBC
Hematocrit: 34.1 % (ref 34.0–46.6)
Hemoglobin: 11.2 g/dL (ref 11.1–15.9)
MCH: 26.1 pg — ABNORMAL LOW (ref 26.6–33.0)
MCHC: 32.8 g/dL (ref 31.5–35.7)
MCV: 80 fL (ref 79–97)
Platelets: 94 10*3/uL — CL (ref 150–450)
RBC: 4.29 x10E6/uL (ref 3.77–5.28)
RDW: 14.8 % (ref 11.7–15.4)
WBC: 3.9 10*3/uL (ref 3.4–10.8)

## 2020-03-29 NOTE — Telephone Encounter (Signed)
-----   Message from Virginia Crews, MD sent at 03/29/2020  8:09 AM EST ----- Stable labs with platelets of 94

## 2020-03-29 NOTE — Telephone Encounter (Signed)
Pt advised.   Thanks,   -Tanaka Gillen  

## 2020-04-02 DIAGNOSIS — H5213 Myopia, bilateral: Secondary | ICD-10-CM | POA: Diagnosis not present

## 2020-04-02 LAB — HM DIABETES EYE EXAM

## 2020-04-03 ENCOUNTER — Encounter: Payer: Self-pay | Admitting: Family Medicine

## 2020-07-01 ENCOUNTER — Inpatient Hospital Stay: Payer: 59 | Attending: Oncology

## 2020-07-01 ENCOUNTER — Inpatient Hospital Stay: Payer: 59 | Admitting: Oncology

## 2020-07-01 ENCOUNTER — Other Ambulatory Visit: Payer: Self-pay

## 2020-07-01 ENCOUNTER — Encounter: Payer: Self-pay | Admitting: Oncology

## 2020-07-01 VITALS — BP 119/82 | HR 78 | Temp 97.1°F

## 2020-07-01 DIAGNOSIS — K219 Gastro-esophageal reflux disease without esophagitis: Secondary | ICD-10-CM | POA: Diagnosis not present

## 2020-07-01 DIAGNOSIS — E538 Deficiency of other specified B group vitamins: Secondary | ICD-10-CM | POA: Diagnosis not present

## 2020-07-01 DIAGNOSIS — D509 Iron deficiency anemia, unspecified: Secondary | ICD-10-CM

## 2020-07-01 DIAGNOSIS — Z7984 Long term (current) use of oral hypoglycemic drugs: Secondary | ICD-10-CM | POA: Diagnosis not present

## 2020-07-01 DIAGNOSIS — Z7901 Long term (current) use of anticoagulants: Secondary | ICD-10-CM | POA: Insufficient documentation

## 2020-07-01 DIAGNOSIS — D696 Thrombocytopenia, unspecified: Secondary | ICD-10-CM | POA: Insufficient documentation

## 2020-07-01 DIAGNOSIS — Z90710 Acquired absence of both cervix and uterus: Secondary | ICD-10-CM | POA: Diagnosis not present

## 2020-07-01 DIAGNOSIS — E119 Type 2 diabetes mellitus without complications: Secondary | ICD-10-CM | POA: Insufficient documentation

## 2020-07-01 DIAGNOSIS — Z86718 Personal history of other venous thrombosis and embolism: Secondary | ICD-10-CM | POA: Diagnosis not present

## 2020-07-01 DIAGNOSIS — C541 Malignant neoplasm of endometrium: Secondary | ICD-10-CM

## 2020-07-01 DIAGNOSIS — Z79899 Other long term (current) drug therapy: Secondary | ICD-10-CM | POA: Insufficient documentation

## 2020-07-01 DIAGNOSIS — D6851 Activated protein C resistance: Secondary | ICD-10-CM | POA: Diagnosis not present

## 2020-07-01 DIAGNOSIS — E785 Hyperlipidemia, unspecified: Secondary | ICD-10-CM | POA: Insufficient documentation

## 2020-07-01 DIAGNOSIS — D649 Anemia, unspecified: Secondary | ICD-10-CM

## 2020-07-01 DIAGNOSIS — Z90722 Acquired absence of ovaries, bilateral: Secondary | ICD-10-CM | POA: Diagnosis not present

## 2020-07-01 LAB — CBC WITH DIFFERENTIAL/PLATELET
Abs Immature Granulocytes: 0.01 10*3/uL (ref 0.00–0.07)
Basophils Absolute: 0 10*3/uL (ref 0.0–0.1)
Basophils Relative: 1 %
Eosinophils Absolute: 0.1 10*3/uL (ref 0.0–0.5)
Eosinophils Relative: 3 %
HCT: 34 % — ABNORMAL LOW (ref 36.0–46.0)
Hemoglobin: 10.9 g/dL — ABNORMAL LOW (ref 12.0–15.0)
Immature Granulocytes: 0 %
Lymphocytes Relative: 39 %
Lymphs Abs: 1.3 10*3/uL (ref 0.7–4.0)
MCH: 25.5 pg — ABNORMAL LOW (ref 26.0–34.0)
MCHC: 32.1 g/dL (ref 30.0–36.0)
MCV: 79.4 fL — ABNORMAL LOW (ref 80.0–100.0)
Monocytes Absolute: 0.2 10*3/uL (ref 0.1–1.0)
Monocytes Relative: 7 %
Neutro Abs: 1.7 10*3/uL (ref 1.7–7.7)
Neutrophils Relative %: 50 %
Platelets: 85 10*3/uL — ABNORMAL LOW (ref 150–400)
RBC: 4.28 MIL/uL (ref 3.87–5.11)
RDW: 15.7 % — ABNORMAL HIGH (ref 11.5–15.5)
WBC: 3.4 10*3/uL — ABNORMAL LOW (ref 4.0–10.5)
nRBC: 0 % (ref 0.0–0.2)

## 2020-07-01 LAB — FERRITIN: Ferritin: 15 ng/mL (ref 11–307)

## 2020-07-01 LAB — COMPREHENSIVE METABOLIC PANEL
ALT: 25 U/L (ref 0–44)
AST: 44 U/L — ABNORMAL HIGH (ref 15–41)
Albumin: 3.8 g/dL (ref 3.5–5.0)
Alkaline Phosphatase: 90 U/L (ref 38–126)
Anion gap: 9 (ref 5–15)
BUN: 19 mg/dL (ref 8–23)
CO2: 22 mmol/L (ref 22–32)
Calcium: 8.9 mg/dL (ref 8.9–10.3)
Chloride: 106 mmol/L (ref 98–111)
Creatinine, Ser: 1.21 mg/dL — ABNORMAL HIGH (ref 0.44–1.00)
GFR, Estimated: 51 mL/min — ABNORMAL LOW (ref >60)
Glucose, Bld: 195 mg/dL — ABNORMAL HIGH (ref 70–99)
Potassium: 4.1 mmol/L (ref 3.5–5.1)
Sodium: 137 mmol/L (ref 135–145)
Total Bilirubin: 1.1 mg/dL (ref 0.3–1.2)
Total Protein: 7.6 g/dL (ref 6.5–8.1)

## 2020-07-01 LAB — IRON AND TIBC
Iron: 35 ug/dL (ref 28–170)
Saturation Ratios: 8 % — ABNORMAL LOW (ref 10.4–31.8)
TIBC: 435 ug/dL (ref 250–450)
UIBC: 400 ug/dL

## 2020-07-01 LAB — IMMATURE PLATELET FRACTION: Immature Platelet Fraction: 2.7 % (ref 1.2–8.6)

## 2020-07-01 LAB — VITAMIN B12: Vitamin B-12: 1584 pg/mL — ABNORMAL HIGH (ref 180–914)

## 2020-07-01 NOTE — Progress Notes (Signed)
Hematology/Oncology Consult note [facility] Telephone:([phone] [phone]   Patient Care Team: Virginia Crews, MD as PCP - General (Family Medicine) Clent Jacks, RN as Oncology Nurse Navigator [place], [place], MD as Referring Physician (Obstetrics) Earlie Server, MD as Consulting Physician (Oncology)  REFERRING PROVIDER: Virginia Crews, MD  CHIEF COMPLAINTS/REASON FOR VISIT:  Evaluation of thrombocytopenia  HISTORY OF PRESENTING ILLNESS:   Cindy Trujillo is a  65 y.o.  female with PMH listed below was seen in consultation at the request of  Brita Romp Dionne Bucy, MD  for evaluation of,  Thrombophilia work-up.    Patient was previously seen by me for thrombocytopenia.  She was last seen by me on 03/21/2019.  She lost follow-up. Patient was recently evaluated by gynecology for postmenopausal bleeding. Patient has had pelvic ultrasound and also 10/31/2019 she underwent D&C with MyoSure. Pathology showed endometrial adenocarcinoma, endometrioid type, FIGO grade 1.  Background EIN/atypical hyperplasia. Patient was seen by Dr. Theora Gianotti and recommend surgical treatment with hysterectomy and bilateral salpingoophorectomy with pelvic washing, pelvic para-aortic lymph node sampling and sentinel lymph node biopsy. Dr. Theora Gianotti recommends the patient reestablish care with me for thrombophilia work-up.  Patient is at risk of developing thrombosis perioperatively given the type of procedure, history of DVT, malignancy.   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  She has been on Eliquis 2.5 mg twice daily.  She was advised by primary care provider to cut 5 mg tablets to half for 2.5 mg twice daily.   Managed by primary care provider.  DVT was found incidentally to her procedure for varicose vein.  For her chronic thrombocytopenia, her initial work-up was nonremarkable except borderline vitamin B12 level.  She was  recommended to observe blood counts and if further worsening of thrombocytopenia, I recommend bone marrow biopsy.  She lost follow-up since December 2020. Ultrasound liver showed coarse hepatic echo Texture suggesting fatty liver disease.  12/06/2019 underwent  hysterectomy with oophorectomy.  Pathology showed endometrioid endometiral adenocarcinoma, low grade, Figo 1, invasive 54m/16mm of total myometrial thickness. No lymph node involvement. pT1a pN0.    INTERVAL HISTORY Cindy PINONis a 65y.o. female who has above history reviewed by me today presents for follow up visit for history of DVT, factor V Leiden heterozygous mutation, thrombocytopenia and endometrial cancer Problems and complaints are listed below: Patient reports feeling well today.  No active bleeding or easy bruising. COVID-19 infection in February 2022.   Review of Systems  Constitutional: Negative for appetite change, chills, fatigue and fever.  HENT:   Negative for hearing loss and voice change.   Eyes: Negative for eye problems.  Respiratory: Negative for chest tightness and cough.   Cardiovascular: Negative for chest pain.  Gastrointestinal: Negative for abdominal distention, abdominal pain and blood in stool.  Endocrine: Negative for hot flashes.  Genitourinary: Negative for difficulty urinating and frequency.   Musculoskeletal: Negative for arthralgias.  Skin: Negative for itching and rash.  Neurological: Negative for extremity weakness.  Hematological: Negative for adenopathy.  Psychiatric/Behavioral: Negative for confusion.    MEDICAL HISTORY:  Past Medical History:  Diagnosis Date  . Diabetes mellitus without complication ([place]   . D[place]) [place] 08[place]   right leg  . GERD (gastroesophageal reflux disease)   . Hyperlipidemia     SURGICAL HISTORY: Past Surgical History:  Procedure Laterality Date  . COLONOSCOPY WITH PROPOFOL N/A 08/23/2017   Procedure: COLONOSCOPY  WITH  PROPOFOL;  Surgeon: Manya Silvas, MD;  [facility];  Service: Endoscopy;  Laterality: N/A;  . ENDOVENOUS ABLATION SAPHENOUS VEIN W/ LASER Left 02/29/2020   endovenous laser ablation left greater saphenous vein and stab phlebectomy 10-20 incisions left leg by Gae Gallop MD   . HYSTEROSCOPY WITH D & C N/A 10/31/2019   Procedure: DILATATION AND CURETTAGE /HYSTEROSCOPY WITH MYOSURE RESECTION;  Surgeon: Homero Fellers, MD;  Location: [facility] ORS;  Service: Gynecology;  Laterality: N/A;  . ROBOTIC ASSISTED TOTAL HYSTERECTOMY WITH BILATERAL SALPINGO OOPHERECTOMY N/A 12/06/2019   Procedure: XI ROBOTIC ASSISTED TOTAL HYSTERECTOMY WITH BILATERAL SALPINGO OOPHORECTOMY;  Surgeon: Gillis Ends, MD;  Location: [facility] ORS;  Service: Gynecology;  Laterality: N/A;  . SENTINEL NODE BIOPSY N/A 12/06/2019   Procedure: SENTINEL NODE BIOPSY;  Surgeon: Gillis Ends, MD;  Location: [facility] ORS;  Service: Gynecology;  Laterality: N/A;  . TONSILLECTOMY    . TONSILLECTOMY AND ADENOIDECTOMY  1968  . VEIN SURGERY  05/2019   laser    SOCIAL HISTORY: Social History   Socioeconomic History  . Marital status: Married    Spouse name: Francee Piccolo  . Number of children: 0  . Years of education: some college  . Highest education level: Not on file  Occupational History  . Occupation: CARE MANGEMENT    Employer: [employer]  Tobacco Use  . Smoking status: Never Smoker  . Smokeless tobacco: Never Used  Vaping Use  . Vaping Use: Never used  Substance and Sexual Activity  . Alcohol use: No    Alcohol/week: 0.0 standard drinks  . Drug use: No  . Sexual activity: Not Currently  Other Topics Concern  . Not on file  Social History Narrative  . Not on file   Social Determinants of Health   Financial Resource Strain: Not on file  Food Insecurity: Not on file  Transportation Needs: Not on file  Physical Activity: Not on file  Stress: Not on file  Social  Connections: Not on file  Intimate Partner Violence: Not on file    FAMILY HISTORY: Family History  Problem Relation Age of Onset  . COPD Mother        heavy smoker  . Pulmonary fibrosis Mother   . Fibromyalgia Mother   . COPD Father   . Melanoma Father   . COPD Sister   . COPD Brother   . COPD Sister   . COPD Sister   . Healthy Brother   . Heart failure Maternal Grandmother   . Heart attack Maternal Grandfather   . Cancer Paternal Grandmother        unsure of type, had mets in colon  . Heart attack Paternal Grandfather   . Cancer Paternal Grandfather        unknown  . Breast cancer Neg Hx   . Cervical cancer Neg Hx     ALLERGIES:  is allergic to sulfa antibiotics.  MEDICATIONS:  Current Outpatient Medications  Medication Sig Dispense Refill  . acetaminophen (TYLENOL) 500 MG tablet Take 500 mg by mouth every 6 (six) hours as needed (for pain.).     Marland Kitchen apixaban (ELIQUIS) 2.5 MG TABS tablet Take 1 tablet (2.5 mg total) by mouth 2 (two) times daily. 180 tablet 1  . Blood Glucose Monitoring Suppl (FREESTYLE LITE) DEVI     . dapagliflozin propanediol (FARXIGA) 10 MG TABS tablet Take 1 tablet (10 mg total) by mouth daily. 90 tablet 3  . ferrous sulfate 325 (65  FE) MG tablet Take 325 mg by mouth 2 (two) times a week.     Marland Kitchen FREESTYLE LITE test strip 1 each by Other route in the morning, at noon, in the evening, and at bedtime. 100 each 3  . FreeStyle Unistick II Lancets MISC Use as instructed to check blood glucose daily 100 each 5  . glucose blood (FREESTYLE TEST STRIPS) test strip Use as instructed to check blood glucose daily 100 each 12  . glucose blood test strip     . glucose monitoring kit (FREESTYLE) monitoring kit 1 each by Does not apply route as needed for other. 1 each 0  . Lancets (FREESTYLE) lancets     . lisinopril (ZESTRIL) 5 MG tablet Take 1 tablet (5 mg total) by mouth daily. 90 tablet 3  . loratadine (CLARITIN) 10 MG tablet Take 1 tablet (10 mg total) by mouth  daily. 90 tablet 3  . metFORMIN (GLUCOPHAGE) 1000 MG tablet Take 1 tablet (1,000 mg total) by mouth 2 (two) times daily with a meal. 180 tablet 3  . Multiple Vitamin (MULTIVITAMIN WITH MINERALS) TABS tablet Take 1 tablet by mouth 2 (two) times a week.    Marland Kitchen omeprazole (PRILOSEC) 20 MG capsule Take 1 capsule (20 mg total) by mouth daily. 90 capsule 3  . rosuvastatin (CRESTOR) 5 MG tablet Take 1 tablet (5 mg total) by mouth daily. 90 tablet 3  . vitamin B-12 (CYANOCOBALAMIN) 1000 MCG tablet Take 1 tablet (1,000 mcg total) by mouth daily. (Patient taking differently: Take 1,000 mcg by mouth every evening.) 90 tablet 1   No current facility-administered medications for this visit.     PHYSICAL EXAMINATION: ECOG PERFORMANCE STATUS: 0 - Asymptomatic Vitals:   07/01/20 1312  BP: 119/82  Pulse: 78  Temp: (!) 97.1 F (36.2 C)   There were no vitals filed for this visit.  Physical Exam Constitutional:      General: She is not in acute distress. HENT:     Head: Normocephalic and atraumatic.  Eyes:     General: No scleral icterus.    Pupils: Pupils are equal, round, and reactive to light.  Cardiovascular:     Rate and Rhythm: Normal rate and regular rhythm.     Heart sounds: Normal heart sounds.  Pulmonary:     Effort: Pulmonary effort is normal. No respiratory distress.     Breath sounds: No wheezing.  Abdominal:     General: Bowel sounds are normal. There is no distension.     Palpations: Abdomen is soft. There is no mass.     Tenderness: There is no abdominal tenderness.  Musculoskeletal:        General: No deformity. Normal range of motion.     Cervical back: Normal range of motion and neck supple.  Skin:    General: Skin is warm and dry.     Findings: No erythema or rash.  Neurological:     Mental Status: She is alert and oriented to person, place, and time. Mental status is at baseline.     Cranial Nerves: No cranial nerve deficit.     Coordination: Coordination normal.   Psychiatric:        Mood and Affect: Mood normal.     LABORATORY DATA:  I have reviewed the data as listed Lab Results  Component Value Date   WBC 3.4 (L) 07/01/2020   HGB 10.9 (L) 07/01/2020   HCT 34.0 (L) 07/01/2020   MCV 79.4 (L) 07/01/2020   PLT  85 (L) 07/01/2020   Recent Labs    11/23/19 0806 12/06/19 1802 01/24/20 1424 07/01/20 1246  NA 139 137 140 137  K 4.2 4.5 4.4 4.1  CL 109 107 109 106  CO2 20* 21* 22 22  GLUCOSE 186* 170* 125* 195*  BUN 15 13 20 19   CREATININE 1.12* 1.19* 1.16* 1.21*  CALCIUM 9.2 8.4* 9.0 8.9  GFRNONAA 53* 49* 50* 51*  GFRAA >60 57*  --   --   PROT 7.3 6.3* 7.6 7.6  ALBUMIN 3.9 3.5 4.0 3.8  AST 42* 44* 52* 44*  ALT 26 25 28 25   ALKPHOS 88 68 81 90  BILITOT 1.2 1.5* 0.8 1.1   Iron/TIBC/Ferritin/ %Sat    Component Value Date/Time   IRON 35 07/01/2020 1246   TIBC 435 07/01/2020 1246   FERRITIN 15 07/01/2020 1246   IRONPCTSAT 8 (L) 07/01/2020 1246      RADIOGRAPHIC STUDIES: I have personally reviewed the radiological images as listed and agreed with the findings in the report. No results found.    ASSESSMENT & PLAN:  1. Endometrial cancer ([place])   2. Iron deficiency anemia, unspecified iron deficiency anemia type   3. Thrombocytopenia ([place])   4. History of DVT (deep vein thrombosis)   Cancer Staging Endometrial cancer [facility]) Staging form: Corpus Uteri - Carcinoma and Carcinosarcoma, AJCC 8th Edition - Pathologic stage from 12/25/2019: Stage IA (pT1a, pN0, cM0) - Signed by Earlie Server, MD on 12/25/2019   #Endometrioid endometrial cancer, stage IA, Loss of MLH1 No LVI, continue follow-up with GYN oncology alternating with GYN for pelvic examination every 6 months.  # History of DVT, and heterozygous factor V leiden mutation.  Continue Eliquis for anticoagulation prophylaxis.  #Chronic thrombocytopenia, previously work up was not remarkable. Platelet count is 85,000. Immature platelet count is 2.7, inappropriately normal. I  discussed with patient about bone marrow biopsy for further work-up.  Possible underproduction of the bone marrow.  Patient prefers to be monitored and if platelet count progressively decreases, consider bone marrow biopsy at that point. History of abnormal ultrasound of the abdomen which shows coarse hepatic echotexture.  Not established with gastroenterology yet.  #History of borderline B12 deficiency, vitamin B12 levels adequate. #Anemia, iron panel was added today which is consistent with iron deficiency. Discussed about the oral iron supplementation option versus IV Venofer treatments. Side effects of IV Venofer treatments including  allergy reactions/infusion reaction, including anaphylactic reaction, were discussed with patient. Other side effects include but not limited to high blood pressure, skin rash, weight gain, leg swelling, etc. Patient voices understanding and will consider if needed.    Orders Placed This Encounter  Procedures  . Iron and TIBC    Standing Status:   Future    Number of Occurrences:   1    Standing Expiration Date:   07/01/2021  . Immature Platelet Fraction    Standing Status:   Future    Number of Occurrences:   1    Standing Expiration Date:   07/01/2021  . Ferritin    Standing Status:   Future    Number of Occurrences:   1    Standing Expiration Date:   07/01/2021  . CBC with Differential/Platelet    Standing Status:   Future    Standing Expiration Date:   07/01/2021  . Comprehensive metabolic panel    Standing Status:   Future    Standing Expiration Date:   07/01/2021    All questions were answered. The  patient knows to call the clinic with any problems questions or concerns. cc Brita Romp Dionne Bucy, MD    Return of visit: 3 months  Earlie Server, MD, PhD Hematology Oncology [facility] Pager- [phone] 07/01/2020

## 2020-07-01 NOTE — Progress Notes (Signed)
Survivorship Care Plan visit completed.  Treatment summary reviewed and given to patient.  ASCO answers booklet reviewed and given to patient.  CARE program and Cancer Transitions discussed with patient along with other resources cancer center offers to patients and caregivers.  Patient verbalized understanding.  Patient declined for APP to have a Virtual visit to introduce them to the Survivorship Clinic.  Encouraged patient to call for any questions or concerns.

## 2020-07-02 ENCOUNTER — Telehealth: Payer: Self-pay

## 2020-07-02 DIAGNOSIS — D649 Anemia, unspecified: Secondary | ICD-10-CM

## 2020-07-02 DIAGNOSIS — C541 Malignant neoplasm of endometrium: Secondary | ICD-10-CM

## 2020-07-02 DIAGNOSIS — D509 Iron deficiency anemia, unspecified: Secondary | ICD-10-CM

## 2020-07-02 NOTE — Telephone Encounter (Signed)
Patient is active on MyChart so I also sent her a MyChart message.

## 2020-07-02 NOTE — Telephone Encounter (Signed)
-----   Message from Earlie Server, MD sent at 07/01/2020  9:35 PM EDT ----- Please let patient know that her iron levels are low. Her option will be try oral iron supplementation ferrous sulfate 325 mg twice daily with meals versus IV Venofer treatments weekly x2.  If she wants to proceed with oral iron supplementation, please send 90-monthsupply of iron if she needs a prescription.  If she wants to proceed with IV Venofer treatments, please schedule her for weekly Venofer x2.  She will keep her follow-up appointment in 3 months.  Add iron panel to next visit.  Labs prior Also suggest patient to see gastroenterology as I am concerned that she may has chronic blood loss which leads to iron deficiency.

## 2020-07-02 NOTE — Telephone Encounter (Signed)
Pt did not answer. Will call her back again later today.

## 2020-07-03 NOTE — Telephone Encounter (Signed)
Unable to reach pt. Left VM for her to return call. Looks like she has seen the [employer] sent.

## 2020-07-04 ENCOUNTER — Other Ambulatory Visit: Payer: Self-pay

## 2020-07-04 MED ORDER — FERROUS SULFATE 325 (65 FE) MG PO TABS: 325.0000 mg | ORAL_TABLET | Freq: Two times a day (BID) | ORAL | 2 refills | Status: DC

## 2020-07-04 NOTE — Telephone Encounter (Signed)
Spoke to patient and she states she will try oral iron. Refill sent to her pharmacy. Notified her of MD recommendation to establish care with GI. pt voiced understanding.

## 2020-07-13 ENCOUNTER — Other Ambulatory Visit: Payer: Self-pay

## 2020-07-14 ENCOUNTER — Other Ambulatory Visit (HOSPITAL_BASED_OUTPATIENT_CLINIC_OR_DEPARTMENT_OTHER): Payer: Self-pay

## 2020-07-14 ENCOUNTER — Other Ambulatory Visit: Payer: Self-pay

## 2020-07-15 ENCOUNTER — Other Ambulatory Visit: Payer: Self-pay

## 2020-07-16 ENCOUNTER — Other Ambulatory Visit: Payer: Self-pay

## 2020-07-24 ENCOUNTER — Inpatient Hospital Stay: Payer: 59 | Attending: Oncology | Admitting: Obstetrics and Gynecology

## 2020-07-24 VITALS — BP 115/74 | HR 104 | Temp 98.0°F | Resp 20 | Wt 222.9 lb

## 2020-07-24 DIAGNOSIS — R7401 Elevation of levels of liver transaminase levels: Secondary | ICD-10-CM | POA: Insufficient documentation

## 2020-07-24 DIAGNOSIS — Z6838 Body mass index (BMI) 38.0-38.9, adult: Secondary | ICD-10-CM | POA: Diagnosis not present

## 2020-07-24 DIAGNOSIS — Z7984 Long term (current) use of oral hypoglycemic drugs: Secondary | ICD-10-CM | POA: Diagnosis not present

## 2020-07-24 DIAGNOSIS — C541 Malignant neoplasm of endometrium: Secondary | ICD-10-CM | POA: Insufficient documentation

## 2020-07-24 DIAGNOSIS — K219 Gastro-esophageal reflux disease without esophagitis: Secondary | ICD-10-CM | POA: Diagnosis not present

## 2020-07-24 DIAGNOSIS — E785 Hyperlipidemia, unspecified: Secondary | ICD-10-CM | POA: Insufficient documentation

## 2020-07-24 DIAGNOSIS — Z90722 Acquired absence of ovaries, bilateral: Secondary | ICD-10-CM | POA: Diagnosis not present

## 2020-07-24 DIAGNOSIS — Z90710 Acquired absence of both cervix and uterus: Secondary | ICD-10-CM | POA: Diagnosis not present

## 2020-07-24 DIAGNOSIS — E119 Type 2 diabetes mellitus without complications: Secondary | ICD-10-CM | POA: Insufficient documentation

## 2020-07-24 DIAGNOSIS — Z86718 Personal history of other venous thrombosis and embolism: Secondary | ICD-10-CM | POA: Diagnosis not present

## 2020-07-24 DIAGNOSIS — Z79899 Other long term (current) drug therapy: Secondary | ICD-10-CM | POA: Diagnosis not present

## 2020-07-24 DIAGNOSIS — R809 Proteinuria, unspecified: Secondary | ICD-10-CM | POA: Insufficient documentation

## 2020-07-24 DIAGNOSIS — Z7901 Long term (current) use of anticoagulants: Secondary | ICD-10-CM | POA: Diagnosis not present

## 2020-07-24 NOTE — Progress Notes (Signed)
Gynecologic Oncology Interval Visit   Referring Provider: Adrian Prows, MD  Chief Concern: endometrial cancer  Subjective:  Cindy Trujillo is a 65 y.o. G0P0 female who is seen in consultation from Dr. Gilman Schmidt for endometrial cancer.   She presents today for surveillance.   At her postoperative visit repeat labs to follow up anemia, elevated LFTs/bili and low calcium. She is seeing Dr. Tasia Catchings for management. Most recent iron study showed saturation of 8 with normal iron, ferritin, and TIBC levels. AST stable at 44 and ALT normal and bilirubin; calcium normalized.   Gynecologic Oncology History Cindy Trujillo is a pleasant female who is seen in consultation from Dr. Gilman Schmidt for endometrial cancer.  She reported PMB which has been occurring on and off since June of 2021.  Evaluation included the following:   Pelvic US 09/21/2019 FINDINGS: Uterus: measurements: 8.6 x 3.2 x 4.9 cm = volume: 71.8 mL. Uterus is anteverted. No fibroids or other mass. Endometrium: Focal endometrial lesion/thickening measuring 1.1 x 0.9 x 1.4 cm seen within the central aspect of the endometrial complex. Associated scattered areas of internal vascularity. Elsewhere, the endometrial stripe is thin measuring no more than 2 mm.  Right ovary: Not visualized.  No adnexal mass. Left ovary: Measurements: 1.9 x 1.5 x 1.9 cm = volume: 1.7 mL. Normal appearance/no adnexal mass. IMPRESSION: 1. 1.1 x 0.9 x 1.4 cm focal endometrial lesion/thickening as above. Finding is nonspecific, with primary differential considerations including an endometrial polyp, focal endometrial hyperplasia, or possibly endometrial carcinoma. In the setting of post-menopausal bleeding, endometrial sampling is indicated to exclude carcinoma. If results are benign, sonohysterogram should be considered for focal lesion work-up prior to hysteroscopy. (Ref: Radiological Reasoning: Algorithmic Workup of Abnormal Vaginal Bleeding with Endovaginal Sonography  and Sonohysterography. AJR 2008; 944:[id number]). 2. Otherwise normal sonographic appearance of the uterus. 3. Normal left ovary, with nonvisualization of the right ovary. No adnexal mass or free fluid.  Pap 09/21/2019 NILM; HRHPV negative  10/31/2019 D&C with Myosure Uterus sounded to 9 cm.  Hysteroscopy revealed polypoid tissue growing from the right cornua.  Pathology A. ENDOMETRIUM, CURETTAGE:  - ENDOMETRIAL ADENOCARCINOMA, ENDOMETRIOID TYPE, FIGO GRADE 1.  - BACKGROUND EIN / ATYPICAL HYPERPLASIA.  Of note she had a DVT in May of 2020. Currently on Eliquis. DVT was found incidentally to her procedure for varicose veins.   She also has h/o low platelets and follows up with hematology/oncology for a bone marrow biopsy.   12/06/2019 She underwent Exam under anesthesia, Robotic assisted total hysterectomy and bilateral salpingo-oophorectomy with sentinel node injection, mapping, and biopsies.     - ENDOMETRIOID ENDOMETRIAL ADENOCARCINOMA, LOW GRADE (FIGO 1), INVASIVE 2 MM OF 16 MM TOTAL MYOMETRIAL THICKNESS (12.5%).    - FALLOPIAN TUBES and OVARIES: BENIGN  B. LYMPH NODE, RIGHT EXTERNAL ILIAC SENTINEL; EXCISION:  - ONE LYMPH NODE, NEGATIVE FOR MALIGNANCY (0/1).  C. LYMPH NODE, LEFT EXTERNAL ILIAC SENTINEL; EXCISION:  - THREE LYMPH NODES, NEGATIVE FOR MALIGNANCY (0/3).  D. LYMPH NODE, RIGHT EXTERNAL ILIAC; EXCISION:  - TWO LYMPH NODES, NEGATIVE FOR MALIGNANCY (0/2).   Immunohistochemistry (IHC) Testing for DNA Mismatch Repair (MMR)  Proteins:  Results:  MLH1: Loss of protein expression  MSH2: Intact nuclear expression  MSH6: Intact nuclear expression  PMS2: Loss of protein expression  IHC Interpretation: Abnormal result with absent nuclear staining for  MLH1 and PMS2, high probability of MSI-H.   MLH1 Methylation Analysis  MLH1 Result: POSITIVE      Problem List: Patient Active Problem List  Diagnosis Date Noted  . IDA (iron deficiency anemia) 07/01/2020  .  Thrombocytopenia ([place]) 03/28/2020  . Normocytic anemia 01/24/2020  . Elevated LFTs 01/24/2020  . Elevated bilirubin 01/24/2020  . Hypocalcemia 01/24/2020  . S/P hysterectomy with oophorectomy 12/07/2019  . Endometrial cancer ([place]) 12/06/2019  . Post-menopausal bleeding 09/21/2019  . Cervical cancer screening 09/21/2019  . Annual physical exam 09/21/2019  . DVT (deep venous thrombosis) ([place]) 11/29/2018  . Acute deep vein thrombosis (DVT) of femoral vein of right lower extremity ([place]) 10/12/2018  . Microalbuminuria due to type 2 diabetes mellitus ([place]) 06/23/2018  . Varicose veins of leg with pain, bilateral 05/20/2018  . Varicose veins of both lower extremities 08/27/2017  . T2DM (type 2 diabetes mellitus) ([place])   . Hyperlipidemia associated with type 2 diabetes mellitus ([place])   . Allergic rhinitis 12/11/2016  . GERD (gastroesophageal reflux disease) 12/11/2016  . Morbid obesity ([place]) 12/11/2016    Past Medical History: Past Medical History:  Diagnosis Date  . Diabetes mellitus without complication ([place])   . DVT (deep venous thrombosis) ([place]) 08/2018   right leg  . GERD (gastroesophageal reflux disease)   . Hyperlipidemia     Past Surgical History: Past Surgical History:  Procedure Laterality Date  . COLONOSCOPY WITH PROPOFOL N/A 08/23/2017   Procedure: COLONOSCOPY WITH PROPOFOL;  Surgeon: Manya Silvas, MD;  [facility] END[facility]COPY;  Service: Endoscopy;  Laterality: N/A;  . ENDOVENOUS ABLATION SAPHENOUS VEIN W/ LASER Left 02/29/2020   endovenous laser ablation left greater saphenous vein and stab phlebectomy 10-20 incisions left leg by Gae Gallop MD   . HYSTEROSCOPY WITH D & C N/A 10/31/2019   Procedure: DILATATION AND CURETTAGE /HYSTEROSCOPY WITH MYOSURE RESECTION;  Surgeon: Homero Fellers, MD;  Location: [facility] ORS;  Service: Gynecology;  Laterality: N/A;  . ROBOTIC ASSISTED TOTAL HYSTERECTOMY WITH BILATERAL SALPINGO OOPHERECTOMY N/A 12/06/2019   Procedure: XI  ROBOTIC ASSISTED TOTAL HYSTERECTOMY WITH BILATERAL SALPINGO OOPHORECTOMY;  Surgeon: Gillis Ends, MD;  Location: [facility] ORS;  Service: Gynecology;  Laterality: N/A;  . SENTINEL NODE BIOPSY N/A 12/06/2019   Procedure: SENTINEL NODE BIOPSY;  Surgeon: Gillis Ends, MD;  Location: [facility] ORS;  Service: Gynecology;  Laterality: N/A;  . TONSILLECTOMY    . TONSILLECTOMY AND ADENOIDECTOMY  1968  . VEIN SURGERY  05/2019   laser    Past Gynecologic History:  Gynecological History Menarche: 56 Menopause: 58 Describes periods as regular, monthly Last pap smear: 09/21/2019- NIL Last Mammogram: None, patient only has clinical exams- declines mammogram  Hx OCP usage for for 15-18 years Hx obesity for 20 years   OB History:  G0P0 OB History  Gravida Para Term Preterm AB Living  0 0 0 0 0 0  SAB IAB Ectopic Multiple Live Births  0 0 0 0 0    Family History: Family History  Problem Relation Age of Onset  . COPD Mother        heavy smoker  . Pulmonary fibrosis Mother   . Fibromyalgia Mother   . COPD Father   . Melanoma Father   . COPD Sister   . COPD Brother   . COPD Sister   . COPD Sister   . Healthy Brother   . Heart failure Maternal Grandmother   . Heart attack Maternal Grandfather   . Cancer Paternal Grandmother        unsure of type, had mets in colon  . Heart attack Paternal Grandfather   . Cancer Paternal Grandfather  unknown  . Breast cancer Neg Hx   . Cervical cancer Neg Hx     Social History: Retired worked a a Production assistant, radio Social History   Socioeconomic History  . Marital status: Married    Spouse name: Francee Piccolo  . Number of children: 0  . Years of education: some college  . Highest education level: Not on file  Occupational History  . Occupation: CARE MANGEMENT    Employer: [employer]  Tobacco Use  . Smoking status: Never Smoker  . Smokeless tobacco: Never Used  Vaping Use  . Vaping Use: Never used   Substance and Sexual Activity  . Alcohol use: No    Alcohol/week: 0.0 standard drinks  . Drug use: No  . Sexual activity: Not Currently  Other Topics Concern  . Not on file  Social History Narrative  . Not on file   Social Determinants of Health   Financial Resource Strain: Not on file  Food Insecurity: Not on file  Transportation Needs: Not on file  Physical Activity: Not on file  Stress: Not on file  Social Connections: Not on file  Intimate Partner Violence: Not on file    Allergies: Allergies  Allergen Reactions  . Sulfa Antibiotics Hives    Current Medications: Current Outpatient Medications  Medication Sig Dispense Refill  . acetaminophen (TYLENOL) 500 MG tablet Take 500 mg by mouth every 6 (six) hours as needed (for pain.).     Marland Kitchen apixaban (ELIQUIS) 2.5 MG TABS tablet TAKE 1 TABLET BY MOUTH TWICE DAILY 180 tablet 1  . Blood Glucose Monitoring Suppl (FREESTYLE LITE) DEVI     . dapagliflozin propanediol (FARXIGA) 10 MG TABS tablet Take 1 tablet (10 mg total) by mouth daily. 90 tablet 3  . ferrous sulfate 325 (65 FE) MG tablet TAKE 1 TABLET BY MOUTH TWICE DAILY 60 tablet 2  . FREESTYLE LITE test strip USE ONE STRIP EVERY MORNING, AT NOON, IN THE EVENING, AND AT BEDTIME. (Patient taking differently: USE ONE STRIP EVERY MORNING, AT NOON, IN THE EVENING, AND AT BEDTIME.) 100 strip 3  . FreeStyle Unistick II Lancets MISC Use as instructed to check blood glucose daily 100 each 5  . glucose blood (FREESTYLE TEST STRIPS) test strip Use as instructed to check blood glucose daily 100 each 12  . glucose monitoring kit (FREESTYLE) monitoring kit 1 each by Does not apply route as needed for other. 1 each 0  . Lancets (FREESTYLE) lancets     . lisinopril (ZESTRIL) 5 MG tablet TAKE 1 TABLET (5 MG TOTAL) BY MOUTH DAILY. 90 tablet 3  . loratadine (CLARITIN) 10 MG tablet Take 1 tablet (10 mg total) by mouth daily. 90 tablet 3  . metFORMIN (GLUCOPHAGE) 1000 MG tablet TAKE 1 TABLET BY  MOUTH TWICE DAILY WITH A MEAL. 180 tablet 3  . Multiple Vitamin (MULTIVITAMIN WITH MINERALS) TABS tablet Take 1 tablet by mouth 2 (two) times a week.    Marland Kitchen omeprazole (PRILOSEC) 20 MG capsule Take 1 capsule (20 mg total) by mouth daily. 90 capsule 3  . rosuvastatin (CRESTOR) 5 MG tablet Take 1 tablet (5 mg total) by mouth daily. 90 tablet 3  . vitamin B-12 (CYANOCOBALAMIN) 1000 MCG tablet Take 1 tablet (1,000 mcg total) by mouth daily. (Patient taking differently: Take 1,000 mcg by mouth every evening.) 90 tablet 1  . glucose blood test strip     . rosuvastatin (CRESTOR) 5 MG tablet TAKE 1 TABLET BY MOUTH DAILY. (Patient taking  differently: Take 5 mg by mouth every evening.) 90 tablet 3   No current facility-administered medications for this visit.    Review of Systems  General: fatigue  HEENT: no complaints  Lungs: no complaints  Cardiac: no complaints  GI: no complaints  GU: no complaints  Musculoskeletal: no complaints  Extremities: no complaints  Skin: no complaints  Neuro: no complaints  Endocrine: no complaints  Psych: no complaints         Objective:  Physical Examination:  BP 115/74   Pulse (!) 104   Temp 98 F (36.7 C)   Resp 20   Wt 222 lb 14.4 oz (101.1 kg)   SpO2 100%   BMI 38.26 kg/m    ECOG Performance Status: 0 - Asymptomatic  GENERAL: Patient is a well appearing female in no acute distress HEENT:  PERRL, neck supple with midline trachea.  NODES:  No cervical, supraclavicular, axillary, or inguinal lymphadenopathy palpated.  LUNGS:  Clear to auscultation bilaterally.  No wheezes or rhonchi. HEART:  Regular rate and rhythm. No murmur appreciated. ABDOMEN:  Soft, nontender.  No ascites or masses. All incisions well healed and no evidence of hernias.  MSK:  No focal spinal tenderness to palpation. Full range of motion bilaterally in the upper extremities. EXTREMITIES:  No peripheral edema.   SKIN:  Clear with no obvious rashes or skin changes. No nail  dyscrasia. NEURO:  Nonfocal. Well oriented.  Appropriate affect.  Pelvic: EGBUS: no lesions Cervix: absent Vagina: no lesions, no discharge or bleeding Uterus: absent Adnexa: no palpable masses Rectovaginal: confirmatory   Lab Review N/A   Chemistry      Component Value Date/Time   NA 137 07/01/2020 1246   NA 141 06/26/2019 0908   K 4.1 07/01/2020 1246   CL 106 07/01/2020 1246   CO2 22 07/01/2020 1246   BUN 19 07/01/2020 1246   BUN 15 06/26/2019 0908   CREATININE 1.21 (H) 07/01/2020 1246   CREATININE 0.81 01/15/2017 1026      Component Value Date/Time   CALCIUM 8.9 07/01/2020 1246   ALKPHOS 90 07/01/2020 1246   AST 44 (H) 07/01/2020 1246   ALT 25 07/01/2020 1246   BILITOT 1.1 07/01/2020 1246   BILITOT 0.8 06/26/2019 0908     Lab Results  Component Value Date   WBC 3.4 (L) 07/01/2020   HGB 10.9 (L) 07/01/2020   HCT 34.0 (L) 07/01/2020   MCV 79.4 (L) 07/01/2020   PLT 85 (L) 07/01/2020     Radiologic Imaging: As per HPI    Assessment:  ANNALYSA MOHAMMAD is a 65 y.o. 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   Pancytopenia  Elevated AST otherwise normal LFTs  Medical co-morbidities complicating care: DVT on anti-coagulation, GERD, diabetes, prior abdominal surgery and immunocompromised. Body mass index is 38.26 kg/m.  Plan:   Problem List Items Addressed This Visit      Genitourinary   Endometrial cancer ([place]) - Primary     I have recommended continued close follow up with exams, including pelvic exams 6 months for 5 years and then annually thereafter.  We previously reviewed the following: Imaging and laboratory assessment is based on clinical indication. Patient education for obesity, lifestyle, exercise, nutrition, weight loss.   We  will begin alternating with Dr. Gilman Schmidt after her next appointment.  Recommended continued follow up with Dr. Tasia Catchings.   The patient's diagnosis, an outline of the further diagnostic and laboratory studies which will be required, the recommendation, and alternatives were discussed.  All questions were answered to the patient's satisfaction.   Terence Bart Gaetana Michaelis, MD

## 2020-07-24 NOTE — Patient Instructions (Signed)
Please contact Dr. Gennette Pac office for appointment in 3 months and we will see you back in 6 months. If you have concerning symptoms, please call us so we can see you sooner.

## 2020-07-28 MED FILL — Apixaban Tab 2.5 MG: ORAL | 90 days supply | Qty: 180 | Fill #0 | Status: AC

## 2020-07-29 ENCOUNTER — Other Ambulatory Visit: Payer: Self-pay

## 2020-07-30 ENCOUNTER — Other Ambulatory Visit: Payer: Self-pay

## 2020-08-13 ENCOUNTER — Other Ambulatory Visit: Payer: Self-pay

## 2020-08-13 ENCOUNTER — Encounter: Payer: 59 | Attending: Family Medicine

## 2020-08-13 DIAGNOSIS — C541 Malignant neoplasm of endometrium: Secondary | ICD-10-CM | POA: Insufficient documentation

## 2020-08-13 NOTE — Progress Notes (Signed)
CARE Daily Session Note  Patient Details  Name: Cindy Trujillo MRN: [id number] Date of Birth: 12-Aug-1955 Referring Provider:   April Manson Cancer Associated Rehabilitation & Exercise from 08/13/2020 in [facility] Cardiac and Pulmonary Rehab  Referring Provider Earlie Server MD      Encounter Date: 08/13/2020  Check In:  Session Check In - 08/13/20 1404      Check-In   Supervising physician immediately available to respond to emergencies See telemetry face sheet for immediately available ER MD    Location [facility]-Cardiac & Pulmonary Rehab    Staff Present Coralie Keens, MS Exercise Physiologist;Jessica Luan Pulling, MA, RCEP, CCRP, Kathaleen Maser, MPA, RN    Virtual Visit No    Medication changes reported     No    Fall or balance concerns reported    No    Warm-up and Cool-down Not performed (comment)   6MWT and Gym Orientation   Resistance Training Performed No    VAD Patient? No    PAD/SET Patient? No            6 Minute Walk    Row Name 08/13/20 1358         6 Minute Walk   Phase Initial     Distance 1162 feet     Walk Time 6 minutes     # of Rest Breaks 0     MPH 2.2     METS 2.43     RPE 11     Perceived Dyspnea  0     VO2 Peak 8.52     Symptoms No     Resting HR 84 bpm     Resting BP 106/64     Resting Oxygen Saturation  97 %     Exercise Oxygen Saturation  during 6 min walk 95 %     Max Ex. HR 107 bpm     Max Ex. BP 114/62     2 Minute Post BP 108/64             Social History   Tobacco Use  Smoking Status Never Smoker  Smokeless Tobacco Never Used    Goals Met:  Exercise tolerated well Personal goals reviewed No report of cardiac concerns or symptoms  Goals Unmet:  Not Applicable  Comments: First full day of orientation. All starting workloads were established based on the results of the 6 minute walk test done at initial orientation visit.  The plan for exercise progression was also introduced and progression will be customized based on patient's  performance and goals.   Dr. Emily Filbert is Medical Director for [place] and [facility].

## 2020-08-16 DIAGNOSIS — L728 Other follicular cysts of the skin and subcutaneous tissue: Secondary | ICD-10-CM | POA: Diagnosis not present

## 2020-08-16 DIAGNOSIS — L538 Other specified erythematous conditions: Secondary | ICD-10-CM | POA: Diagnosis not present

## 2020-08-16 DIAGNOSIS — L918 Other hypertrophic disorders of the skin: Secondary | ICD-10-CM | POA: Diagnosis not present

## 2020-08-16 DIAGNOSIS — R208 Other disturbances of skin sensation: Secondary | ICD-10-CM | POA: Diagnosis not present

## 2020-08-16 DIAGNOSIS — L720 Epidermal cyst: Secondary | ICD-10-CM | POA: Diagnosis not present

## 2020-08-20 ENCOUNTER — Other Ambulatory Visit: Payer: Self-pay

## 2020-08-20 DIAGNOSIS — C541 Malignant neoplasm of endometrium: Secondary | ICD-10-CM

## 2020-08-20 LAB — GLUCOSE, CAPILLARY
Glucose-Capillary: 168 mg/dL — ABNORMAL HIGH (ref 70–99)
Glucose-Capillary: 115 mg/dL — ABNORMAL HIGH (ref 70–99)

## 2020-08-20 NOTE — Progress Notes (Signed)
Daily Session Note  Patient Details  Name: Cindy Trujillo MRN: [id number] Date of Birth: 10-06-1955 Referring Provider:   April Manson Cancer Associated Rehabilitation & Exercise from 08/13/2020 in [facility] Cardiac and Pulmonary Rehab  Referring Provider Earlie Server MD      Encounter Date: 08/20/2020  Check In:  Session Check In - 08/20/20 1234      Check-In   Supervising physician immediately available to respond to emergencies See telemetry face sheet for immediately available ER MD    Location [facility]-Cardiac & Pulmonary Rehab    Staff Present Birdie Sons, MPA, RN;Jessica Luan Pulling, MA, RCEP, CCRP, Marylynn Pearson, MS Exercise Physiologist    Virtual Visit No    Medication changes reported     No    Fall or balance concerns reported    No    Warm-up and Cool-down Performed on first and last piece of equipment    Resistance Training Performed Yes    VAD Patient? No    PAD/SET Patient? No      Pain Assessment   Currently in Pain? No/denies              Social History   Tobacco Use  Smoking Status Never Smoker  Smokeless Tobacco Never Used    Goals Met:  Independence with exercise equipment Exercise tolerated well No report of cardiac concerns or symptoms Strength training completed today  Goals Unmet:  Not Applicable  Comments: First full day of exercise!  Patient was oriented to gym and equipment including functions, settings, policies, and procedures.  Patient's individual exercise prescription and treatment plan were reviewed.  All starting workloads were established based on the results of the 6 minute walk test done at initial orientation visit.  The plan for exercise progression was also introduced and progression will be customized based on patient's performance and goals.    Dr. Emily Filbert is Medical Director for [place] and [facility].

## 2020-08-22 ENCOUNTER — Other Ambulatory Visit: Payer: Self-pay

## 2020-08-22 DIAGNOSIS — C541 Malignant neoplasm of endometrium: Secondary | ICD-10-CM

## 2020-08-22 LAB — GLUCOSE, CAPILLARY: Glucose-Capillary: 179 mg/dL — ABNORMAL HIGH (ref 70–99)

## 2020-08-22 NOTE — Progress Notes (Signed)
Daily Session Note  Patient Details  Name: Cindy Trujillo MRN: [id number] Date of Birth: 1956/01/11 Referring Provider:   April Manson Cancer Associated Rehabilitation & Exercise from 08/13/2020 in [facility] Cardiac and Pulmonary Rehab  Referring Provider Earlie Server MD      Encounter Date: 08/22/2020  Check In:  Session Check In - 08/22/20 1248      Check-In   Supervising physician immediately available to respond to emergencies See telemetry face sheet for immediately available ER MD    Location [facility]-Cardiac & Pulmonary Rehab    Staff Present Birdie Sons, MPA, Elveria Rising, BA, ACSM CEP, Exercise Physiologist;Kara Eliezer Bottom, MS Exercise Physiologist    Virtual Visit No    Medication changes reported     No    Fall or balance concerns reported    No    Warm-up and Cool-down Performed on first and last piece of equipment    Resistance Training Performed Yes    VAD Patient? No    PAD/SET Patient? No      Pain Assessment   Currently in Pain? No/denies              Social History   Tobacco Use  Smoking Status Never Smoker  Smokeless Tobacco Never Used    Goals Met:  Independence with exercise equipment Exercise tolerated well No report of cardiac concerns or symptoms Strength training completed today  Goals Unmet:  Not Applicable  Comments: Pt able to follow exercise prescription today without complaint.  Will continue to monitor for progression.    Dr. Emily Filbert is Medical Director for [place] and [facility].

## 2020-08-27 ENCOUNTER — Encounter: Payer: Self-pay | Admitting: Family Medicine

## 2020-08-27 ENCOUNTER — Other Ambulatory Visit: Payer: Self-pay

## 2020-08-27 DIAGNOSIS — C541 Malignant neoplasm of endometrium: Secondary | ICD-10-CM

## 2020-08-27 NOTE — Progress Notes (Signed)
Daily Session Note  Patient Details  Name: Cindy Trujillo MRN: [id number] Date of Birth: 1955/05/13 Referring Provider:   April Manson Cancer Associated Rehabilitation & Exercise from 08/13/2020 in [facility] Cardiac and Pulmonary Rehab  Referring Provider Earlie Server MD      Encounter Date: 08/27/2020  Check In:  Session Check In - 08/27/20 1230      Check-In   Supervising physician immediately available to respond to emergencies See telemetry face sheet for immediately available ER MD    Location [facility]-Card[facility] & Pulmonary Rehab    Staff Present Birdie Sons, MPA, Elveria Rising, BA, ACSM CEP, Exercise Physiologist;Jessica Luan Pulling, MA, RCEP, CCRP, CCET    Virtual Visit No    Medication changes reported     No    Fall or balance concerns reported    No    Warm-up and Cool-down Performed on first and last piece of equipment    Resistance Training Performed Yes    VAD Patient? No    PAD/SET Patient? No      Pain Assessment   Currently in Pain? No/denies              Social History   Tobacco Use  Smoking Status Never Smoker  Smokeless Tobacco Never Used    Goals Met:  Independence with exercise equipment Exercise tolerated well No report of cardiac concerns or symptoms Strength training completed today  Goals Unmet:  Not Applicable  Comments: Pt able to follow exercise prescription today without complaint.  Will continue to monitor for progression.    Dr. Emily Filbert is Medical Director for [place] and [facility].

## 2020-09-01 MED FILL — Rosuvastatin Calcium Tab 5 MG: ORAL | 90 days supply | Qty: 90 | Fill #0 | Status: AC

## 2020-09-02 ENCOUNTER — Other Ambulatory Visit: Payer: Self-pay

## 2020-09-03 ENCOUNTER — Telehealth: Payer: Self-pay

## 2020-09-03 ENCOUNTER — Other Ambulatory Visit: Payer: Self-pay

## 2020-09-03 DIAGNOSIS — C541 Malignant neoplasm of endometrium: Secondary | ICD-10-CM

## 2020-09-03 NOTE — Telephone Encounter (Signed)
You can fit her into any available appointment.  It does not need to be within the 3 physicals per half day limit.

## 2020-09-03 NOTE — Telephone Encounter (Signed)
LMTCB to schedule appt

## 2020-09-03 NOTE — Telephone Encounter (Signed)
Copied from [place] [id number]. Topic: Appointment Scheduling - Scheduling Inquiry for Clinic >> Sep 03, 2020  8:36 AM Oneta Rack wrote: Reason for CRM: patient 09/23/2020 physical has to be [facility] due to the provider. PCP next available is not until January 2023. Due to insurance premiums patient has to have her annual exam prior to 10/12/2020. Patient also noted a foot exam was not conducted at last physical and to please ensure it is done at this annual exam >> Sep 03, 2020  9:35 AM Sula Rumple wrote: This is an employee of [employer] is there somewhere we are able to work her in?

## 2020-09-03 NOTE — Progress Notes (Signed)
Daily Session Note  Patient Details  Name: CARIME DINKEL MRN: [id number] Date of Birth: 09-12-1955 Referring Provider:   April Manson Cancer Associated Rehabilitation & Exercise from 08/13/2020 in [facility] Cardiac and Pulmonary Rehab  Referring Provider Earlie Server MD      Encounter Date: 09/03/2020  Check In:  Session Check In - 09/03/20 1359      Check-In   Supervising physician immediately available to respond to emergencies See telemetry face sheet for immediately available ER MD    Location [facility] & Pul[facility] Re[facility]    Staff Present Coralie Keens, MS, ASCM CEP, Exercise Physiologist;Jessica Luan Pulling, MA, RCEP, CCRP, CCET    Virtual Visit No    Medication changes reported     No    Fall or balance concerns reported    No    Warm-up and Cool-down Performed on first and last piece of equipment    Resistance Training Performed Yes    VAD Patient? No    PAD/SET Patient? No              Social History   Tobacco Use  Smoking Status Never Smoker  Smokeless Tobacco Never Used    Goals Met:  Independence with exercise equipment Exercise tolerated well No report of cardiac concerns or symptoms Strength training completed today  Goals Unmet:  Not Applicable  Comments: Pt able to follow exercise prescription today without complaint.  Will continue to monitor for progression.   Dr. Emily Filbert is Medical Director for [place].  Dr. Ottie Glazier is Medical Director for [facility] Pulmonary Rehabilitation.

## 2020-09-05 ENCOUNTER — Other Ambulatory Visit: Payer: Self-pay

## 2020-09-05 DIAGNOSIS — C541 Malignant neoplasm of endometrium: Secondary | ICD-10-CM

## 2020-09-05 NOTE — Telephone Encounter (Signed)
Lmtcb to reschedule appt

## 2020-09-05 NOTE — Progress Notes (Signed)
CARE Daily Session Note  Patient Details  Name: Cindy Trujillo MRN: [id number] Date of Birth: 1956-02-05 Referring Provider:   April Manson Cancer Associated Rehabilitation & Exercise from 08/13/2020 in [facility] Cardiac and Pulmonary Rehab  Referring Provider Earlie Server MD      Encounter Date: 09/05/2020  Check In:  Session Check In - 09/05/20 1244      Check-In   Supervising physician immediately available to respond to emergencies See telemetry face sheet for immediately available ER MD    Location [facility]-Cardiac & Pulmonary Rehab    Staff Present Coralie Keens, MS, ASCM CEP, Exercise Physiologist;Amanda Oletta Darter, BA, ACSM CEP, Exercise Physiologist    Virtual Visit No    Medication changes reported     No    Fall or balance concerns reported    No    Warm-up and Cool-down Performed on first and last piece of equipment    Resistance Training Performed No   Yoga   VAD Patient? No    PAD/SET Patient? No              Social History   Tobacco Use  Smoking Status Never Smoker  Smokeless Tobacco Never Used    Goals Met:  Exercise tolerated well Queuing for purse lip breathing No report of cardiac concerns or symptoms  Goals Unmet:  Not Applicable  Comments: Pt able to follow exercise prescription today without complaint.  Will continue to monitor for progression.  Yoga session completed today by Nada Maclachlan, Exercise Physiologist, CEP.   Dr. Emily Filbert is Medical Director for [place].  Dr. Ottie Glazier is Medical Director for [facility] Pulmonary Rehabilitation.

## 2020-09-10 ENCOUNTER — Other Ambulatory Visit: Payer: Self-pay

## 2020-09-10 ENCOUNTER — Encounter: Payer: 59 | Admitting: *Deleted

## 2020-09-10 DIAGNOSIS — C541 Malignant neoplasm of endometrium: Secondary | ICD-10-CM

## 2020-09-10 NOTE — Progress Notes (Signed)
Daily Session Note  Patient Details  Name: Cindy Trujillo MRN: [id number] Date of Birth: 1955-06-09 Referring Provider:   April Manson Cancer Associated Rehabilitation & Exercise from 08/13/2020 in [facility] Cardiac and Pulmonary Rehab  Referring Provider Earlie Server MD      Encounter Date: 09/10/2020  Check In:  Session Check In - 09/10/20 1234      Check-In   Supervising physician immediately available to respond to emergencies See telemetry face sheet for immediately available ER MD    Location [facility]-Cardiac & Pulmonary Rehab    Staff Present Alberteen Sam, MA, RCEP, CCRP, Marylynn Pearson, MS, ASCM CEP, Exercise Physiologist    Virtual Visit No    Medication changes reported     No    Fall or balance concerns reported    No    Warm-up and Cool-down Performed on first and last piece of equipment    Resistance Training Performed Yes    VAD Patient? No    PAD/SET Patient? No      Pain Assessment   Currently in Pain? No/denies              Social History   Tobacco Use  Smoking Status Never Smoker  Smokeless Tobacco Never Used    Goals Met:  Proper associated with RPD/PD & O2 Sat Independence with exercise equipment Exercise tolerated well No report of cardiac concerns or symptoms Strength training completed today  Goals Unmet:  Not Applicable  Comments: Pt able to follow exercise prescription today without complaint.  Will continue to monitor for progression.    Dr. Emily Filbert is Medical Director for [place].  Dr. Ottie Glazier is Medical Director for [facility] Pulmonary Rehabilitation.

## 2020-09-11 ENCOUNTER — Telehealth: Payer: Self-pay

## 2020-09-11 NOTE — Telephone Encounter (Signed)
Copied from [place] [phone]. Topic: General - Other >> Sep 11, 2020  2:24 PM Leward Quan A wrote: Reason for CRM: Patient would like a call back from [place] B at work in the morning since she wont be available in the afternoon. please call at Ph# [phone]

## 2020-09-12 ENCOUNTER — Other Ambulatory Visit: Payer: Self-pay

## 2020-09-12 ENCOUNTER — Encounter: Payer: 59 | Attending: Family Medicine

## 2020-09-12 DIAGNOSIS — C541 Malignant neoplasm of endometrium: Secondary | ICD-10-CM | POA: Insufficient documentation

## 2020-09-12 NOTE — Progress Notes (Signed)
CARE Daily Session Note  Patient Details  Name: Cindy Trujillo MRN: [id number] Date of Birth: 07/09/1955 Referring Provider:   Flowsheet Row Cancer Associated Rehabilitation & Exercise from 08/13/2020 in [facility] Cardiac and Pulmonary Rehab  Referring Provider Yu, Zhou MD      Encounter Date: 09/12/2020  Check In:  Session Check In - 09/12/20 1359      Check-In   Supervising physician immediately available to respond to emergencies See telemetry face sheet for immediately available ER MD    Location [facility]-Cardiac & Pulmonary Rehab    Staff Present Kara Langdon, MS, ASCM CEP, Exercise Physiologist;Amanda Sommer, BA, ACSM CEP, Exercise Physiologist    Virtual Visit No    Medication changes reported     No    Fall or balance concerns reported    No    Warm-up and Cool-down Performed on first and last piece of equipment    Resistance Training Performed Yes    VAD Patient? No              Social History   Tobacco Use  Smoking Status Never Smoker  Smokeless Tobacco Never Used    Goals Met:  Independence with exercise equipment Exercise tolerated well No report of cardiac concerns or symptoms Strength training completed today  Goals Unmet:  Not Applicable  Comments: Pt able to follow exercise prescription today without complaint.  Will continue to monitor for progression.   Dr. Mark Miller is Medical Director for [facility] Cardiac [facility].  Dr. Fuad Aleskerov is Medical Director for [facility] Pulmonary Rehabilitation. 

## 2020-09-16 ENCOUNTER — Other Ambulatory Visit: Payer: Self-pay

## 2020-09-16 MED FILL — Glucose Blood Test Strip: 30 days supply | Qty: 100 | Fill #0 | Status: AC

## 2020-09-17 ENCOUNTER — Other Ambulatory Visit: Payer: Self-pay

## 2020-09-17 ENCOUNTER — Encounter: Payer: Self-pay | Admitting: Oncology

## 2020-09-17 DIAGNOSIS — C541 Malignant neoplasm of endometrium: Secondary | ICD-10-CM

## 2020-09-17 NOTE — Progress Notes (Signed)
Daily Session Note  Patient Details  Name: Cindy Trujillo MRN: [id number] Date of Birth: Sep 02, 1955 Referring Provider:   April Manson Cancer Associated Rehabilitation & Exercise from 08/13/2020 in [facility] Cardiac and Pulmonary Rehab  Referring Provider Earlie Server MD      Encounter Date: 09/17/2020  Check In:  Session Check In - 09/17/20 1236      Check-In   Supervising physician immediately available to respond to emergencies See telemetry face sheet for immediately available ER MD    Location [facility]-Cardiac & Pulmonary Rehab    Staff Present Birdie Sons, MPA, RN;Jessica Luan Pulling, MA, RCEP, CCRP, Marylynn Pearson, MS, ASCM CEP, Exercise Physiologist    Virtual Visit No    Medication changes reported     No    Fall or balance concerns reported    No    Warm-up and Cool-down Performed on first and last piece of equipment    Resistance Training Performed Yes    VAD Patient? No    PAD/SET Patient? No      Pain Assessment   Currently in Pain? No/denies              Social History   Tobacco Use  Smoking Status Never Smoker  Smokeless Tobacco Never Used    Goals Met:  Independence with exercise equipment Exercise tolerated well No report of cardiac concerns or symptoms Strength training completed today  Goals Unmet:  Not Applicable  Comments: Pt able to follow exercise prescription today without complaint.  Will continue to monitor for progression.    Dr. Emily Filbert is Medical Director for [place].  Dr. Ottie Glazier is Medical Director for [facility] Pulmonary Rehabilitation.

## 2020-09-19 ENCOUNTER — Other Ambulatory Visit: Payer: Self-pay

## 2020-09-19 DIAGNOSIS — C541 Malignant neoplasm of endometrium: Secondary | ICD-10-CM

## 2020-09-19 NOTE — Progress Notes (Signed)
Daily Session Note  Patient Details  Name: Cindy Trujillo MRN: [id number] Date of Birth: Aug 24, 1955 Referring Provider:   April Manson Cancer Associated Rehabilitation & Exercise from 08/13/2020 in [facility] Cardiac and Pulmonary Rehab  Referring Provider Earlie Server MD       Encounter Date: 09/19/2020  Check In:  Session Check In - 09/19/20 1316       Check-In   Supervising physician immediately available to respond to emergencies See telemetry face sheet for immediately available ER MD    Location [facility]-Cardiac & Pulmonary Rehab    Staff Present Birdie Sons, MPA, RN;Melissa Caiola RDN, LDN    Virtual Visit No    Medication changes reported     No    Fall or balance concerns reported    No    Warm-up and Cool-down Performed on first and last piece of equipment    Resistance Training Performed Yes    VAD Patient? No    PAD/SET Patient? No      Pain Assessment   Currently in Pain? No/denies                Social History   Tobacco Use  Smoking Status Never  Smokeless Tobacco Never    Goals Met:  Independence with exercise equipment Exercise tolerated well No report of cardiac concerns or symptoms Strength training completed today  Goals Unmet:  Not Applicable  Comments: Pt able to follow exercise prescription today without complaint.  Will continue to monitor for progression.    Dr. Emily Filbert is Medical Director for [employer].  Dr. Ottie Glazier is Medical Director for [facility] Pulmonary Rehabilitation.

## 2020-09-23 ENCOUNTER — Encounter: Payer: Self-pay | Admitting: Family Medicine

## 2020-09-24 ENCOUNTER — Other Ambulatory Visit: Payer: Self-pay

## 2020-09-24 DIAGNOSIS — C541 Malignant neoplasm of endometrium: Secondary | ICD-10-CM

## 2020-09-24 NOTE — Progress Notes (Signed)
Daily Session Note  Patient Details  Name: Cindy Trujillo MRN: [id number] Date of Birth: [birth year] Referring Provider:   April Manson Cancer Associated Rehabilitation & Exercise from 08/13/2020 in [facility] Cardiac and Pulmonary Rehab  Referring Provider Earlie Server MD       Encounter Date: 09/24/2020  Check In:  Session Check In - 09/24/20 1447       Check-In   Supervising physician immediately available to respond to emergencies See telemetry face sheet for immediately available ER MD    Location [facility]-Cardiac & Pulmonary Rehab    Staff Present Coralie Keens, MS, ASCM CEP, Exercise Physiologist;Kelly Rosalia Hammers, MPA, RN;Melissa Caiola RDN, LDN    Virtual Visit No    Medication changes reported     No    Fall or balance concerns reported    No    Warm-up and Cool-down Performed on first and last piece of equipment    Resistance Training Performed Yes    VAD Patient? No                Social History   Tobacco Use  Smoking Status Never  Smokeless Tobacco Never    Goals Met:  Independence with exercise equipment Exercise tolerated well No report of cardiac concerns or symptoms Strength training completed today  Goals Unmet:  Not Applicable  Comments: Pt able to follow exercise prescription today without complaint.  Will continue to monitor for progression.    Dr. Emily Filbert is Medical Director for [place].  Dr. Ottie Glazier is Medical Director for [facility] Pulmonary Rehabilitation.

## 2020-09-26 ENCOUNTER — Other Ambulatory Visit: Payer: Self-pay

## 2020-09-26 DIAGNOSIS — C541 Malignant neoplasm of endometrium: Secondary | ICD-10-CM

## 2020-09-26 NOTE — Progress Notes (Signed)
Daily Session Note  Patient Details  Name: Cindy Trujillo MRN: [id number] Date of Birth: 1956-01-05 Referring Provider:   April Manson Cancer Associated Rehabilitation & Exercise from 08/13/2020 in [facility] Cardiac and Pulmonary Rehab  Referring Provider Earlie Server MD       Encounter Date: 09/26/2020  Check In:  Session Check In - 09/26/20 1333       Check-In   Supervising physician immediately available to respond to emergencies See telemetry face sheet for immediately available ER MD    Location [facility]-Cardiac & Pulmonary Rehab    Staff Present Coralie Keens, MS, ASCM CEP, Exercise Physiologist;Amanda Oletta Darter, BA, ACSM CEP, Exercise Physiologist    Virtual Visit No    Medication changes reported     No    Fall or balance concerns reported    No    Warm-up and Cool-down Performed on first and last piece of equipment    Resistance Training Performed Yes    VAD Patient? No    PAD/SET Patient? No      Pain Assessment   Currently in Pain? No/denies                Social History   Tobacco Use  Smoking Status Never  Smokeless Tobacco Never    Goals Met:  Independence with exercise equipment Exercise tolerated well No report of cardiac concerns or symptoms Strength training completed today  Goals Unmet:  Not Applicable  Comments: Pt able to follow exercise prescription today without complaint.  Will continue to monitor for progression.    Dr. Emily Filbert is Medical Director for [place].  Dr. Ottie Glazier is Medical Director for [facility] Pulmonary Rehabilitation.

## 2020-10-01 ENCOUNTER — Encounter: Payer: 59 | Admitting: *Deleted

## 2020-10-01 ENCOUNTER — Other Ambulatory Visit: Payer: Self-pay

## 2020-10-01 DIAGNOSIS — C541 Malignant neoplasm of endometrium: Secondary | ICD-10-CM

## 2020-10-01 NOTE — Progress Notes (Signed)
CARE Daily Session Note  Patient Details  Name: LANIA ZAWISTOWSKI MRN: [id number] Date of Birth: [birth year] Referring Provider:   April Manson Cancer Associated Rehabilitation & Exercise from 08/13/2020 in [facility] Cardiac and Pulmonary Rehab  Referring Provider Earlie Server MD       Encounter Date: 10/01/2020  Check In:  Session Check In - 10/01/20 1239       Check-In   Supervising physician immediately available to respond to emergencies See telemetry face sheet for immediately available ER MD    Location [facility]-Cardiac & Pulmonary Rehab    Staff Present Alberteen Sam, MA, RCEP, CCRP, Marylynn Pearson, MS, ASCM CEP, Exercise Physiologist    Virtual Visit No    Medication changes reported     No    Fall or balance concerns reported    No    Warm-up and Cool-down Performed on first and last piece of equipment    Resistance Training Performed Yes    VAD Patient? No    PAD/SET Patient? No      Pain Assessment   Currently in Pain? No/denies                Social History   Tobacco Use  Smoking Status Never  Smokeless Tobacco Never    Goals Met:  Proper associated with RPD/PD & O2 Sat Independence with exercise equipment Exercise tolerated well No report of cardiac concerns or symptoms Strength training completed today  Goals Unmet:  Not Applicable  Comments: Pt able to follow exercise prescription today without complaint.  Will continue to monitor for progression.    Dr. Emily Filbert is Medical Director for [place].  Dr. Ottie Glazier is Medical Director for [facility] Pulmonary Rehabilitation.

## 2020-10-02 ENCOUNTER — Other Ambulatory Visit: Payer: Self-pay

## 2020-10-02 ENCOUNTER — Inpatient Hospital Stay: Payer: 59 | Admitting: Oncology

## 2020-10-02 ENCOUNTER — Inpatient Hospital Stay: Payer: 59 | Attending: Oncology

## 2020-10-02 ENCOUNTER — Encounter: Payer: Self-pay | Admitting: Oncology

## 2020-10-02 ENCOUNTER — Telehealth: Payer: Self-pay

## 2020-10-02 VITALS — BP 120/73 | HR 76 | Temp 98.1°F | Resp 18 | Wt 219.9 lb

## 2020-10-02 DIAGNOSIS — Z86718 Personal history of other venous thrombosis and embolism: Secondary | ICD-10-CM | POA: Diagnosis not present

## 2020-10-02 DIAGNOSIS — Z90722 Acquired absence of ovaries, bilateral: Secondary | ICD-10-CM | POA: Insufficient documentation

## 2020-10-02 DIAGNOSIS — Z90710 Acquired absence of both cervix and uterus: Secondary | ICD-10-CM | POA: Insufficient documentation

## 2020-10-02 DIAGNOSIS — D696 Thrombocytopenia, unspecified: Secondary | ICD-10-CM | POA: Diagnosis not present

## 2020-10-02 DIAGNOSIS — D509 Iron deficiency anemia, unspecified: Secondary | ICD-10-CM

## 2020-10-02 DIAGNOSIS — Z8616 Personal history of COVID-19: Secondary | ICD-10-CM | POA: Diagnosis not present

## 2020-10-02 DIAGNOSIS — R932 Abnormal findings on diagnostic imaging of liver and biliary tract: Secondary | ICD-10-CM | POA: Diagnosis not present

## 2020-10-02 DIAGNOSIS — C541 Malignant neoplasm of endometrium: Secondary | ICD-10-CM

## 2020-10-02 DIAGNOSIS — R935 Abnormal findings on diagnostic imaging of other abdominal regions, including retroperitoneum: Secondary | ICD-10-CM | POA: Diagnosis not present

## 2020-10-02 DIAGNOSIS — D6851 Activated protein C resistance: Secondary | ICD-10-CM | POA: Insufficient documentation

## 2020-10-02 LAB — CBC WITH DIFFERENTIAL/PLATELET
Abs Immature Granulocytes: 0.02 10*3/uL (ref 0.00–0.07)
Basophils Absolute: 0 10*3/uL (ref 0.0–0.1)
Basophils Relative: 1 %
Eosinophils Absolute: 0.1 10*3/uL (ref 0.0–0.5)
Eosinophils Relative: 2 %
HCT: 35.6 % — ABNORMAL LOW (ref 36.0–46.0)
Hemoglobin: 11.7 g/dL — ABNORMAL LOW (ref 12.0–15.0)
Immature Granulocytes: 1 %
Lymphocytes Relative: 33 %
Lymphs Abs: 1.3 10*3/uL (ref 0.7–4.0)
MCH: 27.5 pg (ref 26.0–34.0)
MCHC: 32.9 g/dL (ref 30.0–36.0)
MCV: 83.6 fL (ref 80.0–100.0)
Monocytes Absolute: 0.3 10*3/uL (ref 0.1–1.0)
Monocytes Relative: 7 %
Neutro Abs: 2.2 10*3/uL (ref 1.7–7.7)
Neutrophils Relative %: 56 %
Platelets: 74 10*3/uL — ABNORMAL LOW (ref 150–400)
RBC: 4.26 MIL/uL (ref 3.87–5.11)
RDW: 15.6 % — ABNORMAL HIGH (ref 11.5–15.5)
WBC: 3.9 10*3/uL — ABNORMAL LOW (ref 4.0–10.5)
nRBC: 0 % (ref 0.0–0.2)

## 2020-10-02 LAB — COMPREHENSIVE METABOLIC PANEL
ALT: 28 U/L (ref 0–44)
AST: 49 U/L — ABNORMAL HIGH (ref 15–41)
Albumin: 4.2 g/dL (ref 3.5–5.0)
Alkaline Phosphatase: 103 U/L (ref 38–126)
Anion gap: 8 (ref 5–15)
BUN: 20 mg/dL (ref 8–23)
CO2: 24 mmol/L (ref 22–32)
Calcium: 9.5 mg/dL (ref 8.9–10.3)
Chloride: 105 mmol/L (ref 98–111)
Creatinine, Ser: 1.11 mg/dL — ABNORMAL HIGH (ref 0.44–1.00)
GFR, Estimated: 56 mL/min — ABNORMAL LOW (ref >60)
Glucose, Bld: 157 mg/dL — ABNORMAL HIGH (ref 70–99)
Potassium: 4.4 mmol/L (ref 3.5–5.1)
Sodium: 137 mmol/L (ref 135–145)
Total Bilirubin: 0.9 mg/dL (ref 0.3–1.2)
Total Protein: 8 g/dL (ref 6.5–8.1)

## 2020-10-02 LAB — IRON AND TIBC
Iron: 42 ug/dL (ref 28–170)
Saturation Ratios: 10 % — ABNORMAL LOW (ref 10.4–31.8)
TIBC: 434 ug/dL (ref 250–450)
UIBC: 392 ug/dL

## 2020-10-02 LAB — IMMATURE PLATELET FRACTION: Immature Platelet Fraction: 3.1 % (ref 1.2–8.6)

## 2020-10-02 LAB — FERRITIN: Ferritin: 25 ng/mL (ref 11–307)

## 2020-10-02 MED FILL — Metformin HCl Tab 1000 MG: ORAL | 90 days supply | Qty: 180 | Fill #0 | Status: AC

## 2020-10-02 NOTE — Progress Notes (Signed)
Patient here for follow up. Pt reports still feeling tired.

## 2020-10-02 NOTE — Telephone Encounter (Signed)
Cindy Trujillo is scheduled for CT Bone Marrow Biopsy on Fri 10/11/20 @ 9:30a  Arrive @8 :30a. The IR nurse will call pt a few days prior to procedure to review instructions. Please schedule MD approx 2 weeks from procedure. I will call pt with appt details.

## 2020-10-02 NOTE — Progress Notes (Signed)
Hematology/Oncology progress note [facility] Telephone:([phone]) [phone] [phone]   Patient Care Team: Virginia Crews, MD as PCP - General (Family Medicine) Clent Jacks, RN as Oncology Nurse Navigator [place], [place], MD as Referring Physician (Obstetrics) Earlie Server, MD as Consulting Physician (Oncology)  REFERRING PROVIDER: Virginia Crews, MD  CHIEF COMPLAINTS/REASON FOR VISIT:  thrombocytopenia  HISTORY OF PRESENTING ILLNESS:   Cindy Trujillo is a  65 y.o.  female with PMH listed below was seen in consultation at the request of  Brita Romp Dionne Bucy, MD  for evaluation of,  Thrombophilia work-up.    Patient was previously seen by me for thrombocytopenia.  She was last seen by me on 03/21/2019.  She lost follow-up. Patient was recently evaluated by gynecology for postmenopausal bleeding. Patient has had pelvic ultrasound and also 10/31/2019 she underwent D&C with MyoSure. Pathology showed endometrial adenocarcinoma, endometrioid type, FIGO grade 1.  Background EIN/atypical hyperplasia. Patient was seen by Dr. Theora Gianotti and recommend surgical treatment with hysterectomy and bilateral salpingoophorectomy with pelvic washing, pelvic para-aortic lymph node sampling and sentinel lymph node biopsy. Dr. Theora Gianotti recommends the patient reestablish care with me for thrombophilia work-up.  Patient is at risk of developing thrombosis perioperatively given the type of procedure, history of DVT, malignancy.   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  She has been on Eliquis 2.5 mg twice daily.  She was advised by primary care provider to cut 5 mg tablets to half for 2.5 mg twice daily.   Managed by primary care provider.  DVT was found incidentally to her procedure for varicose vein.  For her chronic thrombocytopenia, her initial work-up was nonremarkable except borderline vitamin B12 level.  She was recommended  to observe blood counts and if further worsening of thrombocytopenia, I recommend bone marrow biopsy.  She lost follow-up since December 2020. Ultrasound liver showed coarse hepatic echo Texture suggesting fatty liver disease.  12/06/2019 underwent  hysterectomy with oophorectomy.  Pathology showed endometrioid endometiral adenocarcinoma, low grade, Figo 1, invasive 2m/16mm of total myometrial thickness. No lymph node involvement. pT1a pN0.   # COVID-19 infection in February 2022.   INTERVAL HISTORY Cindy BARSANTIis a 65y.o. female who has above history reviewed by me today presents for follow up visit for history of DVT, factor V Leiden heterozygous mutation, thrombocytopenia and endometrial cancer Problems and complaints are listed below: Feels well.  She had bug bites of left lower extremities and left a few purple itchy areas. Bo bleeding events. Denies any recent infection.     Review of Systems  Constitutional:  Negative for appetite change, chills, fatigue and fever.  HENT:   Negative for hearing loss and voice change.   Eyes:  Negative for eye problems.  Respiratory:  Negative for chest tightness and cough.   Cardiovascular:  Negative for chest pain.  Gastrointestinal:  Negative for abdominal distention, abdominal pain and blood in stool.  Endocrine: Negative for hot flashes.  Genitourinary:  Negative for difficulty urinating and frequency.   Musculoskeletal:  Negative for arthralgias.  Skin:  Negative for itching and rash.  Neurological:  Negative for extremity weakness.  Hematological:  Negative for adenopathy.  Psychiatric/Behavioral:  Negative for confusion.    MEDICAL HISTORY:  Past Medical History:  Diagnosis Date   Diabetes mellitus without complication ([facility]    DVT (deep venous thrombosis) ([facility] 08/2018   right leg   GERD (gastroesophageal reflux disease)  Hyperlipidemia     SURGICAL HISTORY: Past Surgical History:  Procedure Laterality Date    COLONOSCOPY WITH PROPOFOL N/A 08/23/2017   Procedure: COLONOSCOPY WITH PROPOFOL;  Surgeon: Manya Silvas, MD;  Location: [facility] ENDOSCOPY;  Service: Endoscopy;  Laterality: N/A;   ENDOVENOUS ABLATION SAPHENOUS VEIN W/ LASER Left 02/29/2020   endovenous laser ablation left greater saphenous vein and stab phlebectomy 10-20 incisions left leg by Gae Gallop MD    HYSTEROSCOPY WITH D & C N/A 10/31/2019   Procedure: DILATATION AND CURETTAGE /HYSTEROSCOPY WITH MYOSURE RESECTION;  Surgeon: Homero Fellers, MD;  Location: [facility] ORS;  Service: Gynecology;  Laterality: N/A;   ROBOTIC ASSISTED TOTAL HYSTERECTOMY WITH BILATERAL SALPINGO OOPHERECTOMY N/A 12/06/2019   Procedure: XI ROBOTIC ASSISTED TOTAL HYSTERECTOMY WITH BILATERAL SALPINGO OOPHORECTOMY;  Surgeon: Gillis Ends, MD;  Location: [facility] ORS;  Service: Gynecology;  Laterality: N/A;   SENTINEL NODE BIOPSY N/A 12/06/2019   Procedure: SENTINEL NODE BIOPSY;  Surgeon: Gillis Ends, MD;  Location: [facility] ORS;  Service: Gynecology;  Laterality: N/A;   TONSILLECTOMY     TONSILLECTOMY AND ADENOIDECTOMY  1968   VEIN SURGERY  05/2019   laser    SOCIAL HISTORY: Social History   Socioeconomic History   Marital status: Married    Spouse name: Roger   Number of children: 0   Years of education: some college   Highest education level: Not on file  Occupational History   Occupation: CARE MANGEMENT    Employer: [employer]  Tobacco Use   Smoking status: Never   Smokeless tobacco: Never  Vaping Use   Vaping Use: Never used  Substance and Sexual Activity   Alcohol use: No    Alcohol/week: 0.0 standard drinks   Drug use: No   Sexual activity: Not Currently  Other Topics Concern   Not on file  Social History Narrative   Not on file   Social Determinants of Health   Financial Resource Strain: Not on file  Food Insecurity: Not on file  Transportation Needs: Not on file  Physical Activity: Not on file   Stress: Not on file  Social Connections: Not on file  Intimate Partner Violence: Not on file    FAMILY HISTORY: Family History  Problem Relation Age of Onset   COPD Mother        heavy smoker   Pulmonary fibrosis Mother    Fibromyalgia Mother    COPD Father    Melanoma Father    COPD Sister    COPD Brother    COPD Sister    COPD Sister    Healthy Brother    Heart failure Maternal Grandmother    Heart attack Maternal Grandfather    Cancer Paternal Grandmother        unsure of type, had mets in colon   Heart attack Paternal Grandfather    Cancer Paternal Grandfather        unknown   Breast cancer Neg Hx    Cervical cancer Neg Hx     ALLERGIES:  is allergic to sulfa antibiotics.  MEDICATIONS:  Current Outpatient Medications  Medication Sig Dispense Refill   acetaminophen (TYLENOL) 500 MG tablet Take 500 mg by mouth every 6 (six) hours as needed (for pain.).      apixaban (ELIQUIS) 2.5 MG TABS tablet TAKE 1 TABLET BY MOUTH TWICE DAILY 180 tablet 1   Blood Glucose Monitoring Suppl (FREESTYLE LITE) DEVI      dapagliflozin propanediol (FARXIGA) 10 MG TABS tablet  Take 1 tablet (10 mg total) by mouth daily. 90 tablet 3   ferrous sulfate 325 (65 FE) MG tablet TAKE 1 TABLET BY MOUTH TWICE DAILY 60 tablet 2   FREESTYLE LITE test strip USE ONE STRIP EVERY MORNING, AT NOON, IN THE EVENING, AND AT BEDTIME. (Patient taking differently: USE ONE STRIP EVERY MORNING, AT NOON, IN THE EVENING, AND AT BEDTIME.) 100 strip 3   FreeStyle Unistick II Lancets MISC Use as instructed to check blood glucose daily 100 each 5   glucose blood (FREESTYLE TEST STRIPS) test strip Use as instructed to check blood glucose daily 100 each 12   glucose monitoring kit (FREESTYLE) monitoring kit 1 each by Does not apply route as needed for other. 1 each 0   Lancets (FREESTYLE) lancets      lisinopril (ZESTRIL) 5 MG tablet TAKE 1 TABLET (5 MG TOTAL) BY MOUTH DAILY. 90 tablet 3   loratadine (CLARITIN) 10 MG  tablet Take 1 tablet (10 mg total) by mouth daily. 90 tablet 3   metFORMIN (GLUCOPHAGE) 1000 MG tablet TAKE 1 TABLET BY MOUTH TWICE DAILY WITH A MEAL. 180 tablet 3   Multiple Vitamin (MULTIVITAMIN WITH MINERALS) TABS tablet Take 1 tablet by mouth 2 (two) times a week.     omeprazole (PRILOSEC) 20 MG capsule Take 1 capsule (20 mg total) by mouth daily. 90 capsule 3   rosuvastatin (CRESTOR) 5 MG tablet Take 1 tablet (5 mg total) by mouth daily. 90 tablet 3   vitamin B-12 (CYANOCOBALAMIN) 1000 MCG tablet Take 1 tablet (1,000 mcg total) by mouth daily. (Patient taking differently: Take 1,000 mcg by mouth every evening.) 90 tablet 1   No current facility-administered medications for this visit.     PHYSICAL EXAMINATION: ECOG PERFORMANCE STATUS: 0 - Asymptomatic Vitals:   10/02/20 1313  BP: 120/73  Pulse: 76  Resp: 18  Temp: 98.1 F (36.7 C)   Filed Weights   10/02/20 1313  Weight: 219 lb 14.4 oz (99.7 kg)    Physical Exam Constitutional:      General: She is not in acute distress. HENT:     Head: Normocephalic and atraumatic.  Eyes:     General: No scleral icterus.    Pupils: Pupils are equal, round, and reactive to light.  Cardiovascular:     Rate and Rhythm: Normal rate and regular rhythm.     Heart sounds: Normal heart sounds.  Pulmonary:     Effort: Pulmonary effort is normal. No respiratory distress.     Breath sounds: No wheezing.  Abdominal:     General: Bowel sounds are normal. There is no distension.     Palpations: Abdomen is soft. There is no mass.     Tenderness: There is no abdominal tenderness.  Musculoskeletal:        General: No deformity. Normal range of motion.     Cervical back: Normal range of motion and neck supple.  Skin:    General: Skin is warm and dry.  Neurological:     Mental Status: She is alert and oriented to person, place, and time. Mental status is at baseline.     Cranial Nerves: No cranial nerve deficit.     Coordination: Coordination  normal.  Psychiatric:        Mood and Affect: Mood normal.     LABORATORY DATA:  I have reviewed the data as listed Lab Results  Component Value Date   WBC 3.9 (L) 10/02/2020   HGB 11.7 (L) 10/02/2020  HCT 35.6 (L) 10/02/2020   MCV 83.6 10/02/2020   PLT 74 (L) 10/02/2020   Recent Labs    11/23/19 0806 12/06/19 1802 01/24/20 1424 07/01/20 1246 10/02/20 1240  NA 139 137 140 137 137  K 4.2 4.5 4.4 4.1 4.4  CL 109 107 109 106 105  CO2 20* 21* _0 GLUCOSE 186* 170* 125* 195* 157*  BUN _1 CREATININE 1.12* 1.19* 1.16* 1.21* 1.11*  CALCIUM 9.2 8.4* 9.0 8.9 9.5  GFRNONAA 53* 49* 50* 51* 56*  GFRAA >60 57*  --   --   --   PROT 7.3 6.3* 7.6 7.6 8.0  ALBUMIN 3.9 3.5 4.0 3.8 4.2  AST 42* 44* 52* 44* 49*  ALT _2 ALKPHOS 88 68 81 90 103  BILITOT 1.2 1.5* 0.8 1.1 0.9    Iron/TIBC/Ferritin/ %Sat    Component Value Date/Time   IRON 35 07/01/2020 1246   TIBC 435 07/01/2020 1246   FERRITIN 15 07/01/2020 1246   IRONPCTSAT 8 (L) 07/01/2020 1246      RADIOGRAPHIC STUDIES: I have personally reviewed the radiological images as listed and agreed with the findings in the report. No results found.    ASSESSMENT & PLAN:  1. Endometrial cancer ([place])   2. Thrombocytopenia ([place])   3. History of DVT (deep vein thrombosis)   4. Abnormal liver ultrasound   5. Iron deficiency anemia, unspecified iron deficiency anemia type   Cancer Staging Endometrial cancer [facility]) Staging form: Corpus Uteri - Carcinoma and Carcinosarcoma, AJCC 8th Edition - Pathologic stage from 12/25/2019: Stage IA (pT1a, pN0, cM0) - Signed by Earlie Server, MD on 12/25/2019   #Endometrioid endometrial cancer, stage IA, Loss of MLH1 No LVI, continue follow-up with with GYN oncology alternating with GYN for pelvic examination every 6 months.  # History of DVT, and heterozygous factor V leiden mutation.  Patient has been on Eliquis 2.5 mg twice daily for prophylaxis.  I advised patient to  come off Eliquis due to worsening of thrombocytopenia and bruising/petechia  #She has some bruising at bug bite sites in her left lower extremity and the bilateral lower extremity petechia. Hold off Eliquis. #Chronic thrombocytopenia, previously work up was not remarkable. Platelet count is 74,000. Immature platelet count is 3.1, inappropriately normal. I discussed with patient about bone marrow biopsy for further work-up.  Possible underproduction of the bone marrow.   Recommend patient to proceed with bone marrow biopsy for evaluation.  She agrees.   History of abnormal ultrasound of the abdomen which shows coarse hepatic echotexture.  Possible fatty liver disease.  Recommend her to establish gastroenterology.  #Iron deficiency anemia, hemoglobin has improved.  Iron panel was available after patient's visit. Continue oral iron supplementation.    Orders Placed This Encounter  Procedures   Immature Platelet Fraction    Standing Status:   Future    Standing Expiration Date:   10/02/2021    All questions were answered. The patient knows to call the clinic with any problems questions or concerns. cc Brita Romp Dionne Bucy, MD    Return of visit: 2 weeks after bone marrow biopsy to go over results. Earlie Server, MD, PhD Hematology Oncology [facility] Pager- [phone] 10/02/2020

## 2020-10-03 ENCOUNTER — Encounter: Payer: Self-pay | Admitting: Oncology

## 2020-10-03 ENCOUNTER — Other Ambulatory Visit: Payer: Self-pay

## 2020-10-03 ENCOUNTER — Telehealth: Payer: Self-pay | Admitting: Oncology

## 2020-10-03 DIAGNOSIS — C541 Malignant neoplasm of endometrium: Secondary | ICD-10-CM

## 2020-10-03 NOTE — Progress Notes (Signed)
Daily Session Note  Patient Details  Name: Cindy Trujillo MRN: [id number] Date of Birth: April 18, 1955 Referring Provider:   April Manson [facility] Associated Rehabilitation & Exercise from 08/13/2020 in [facility] Cardiac [facility] Pulmonary [facility]  Referring Provider Earlie Server MD       Encounter Date: 10/03/2020  Check In:  Session Check In - 10/03/20 1234       Check-In   Supervising physician immediately available to respond to emergencies See telemetry face sheet for immediately available ER MD    Location [facility]-Cardiac & Pulmonary Rehab    Staff Present Birdie Sons, MPA, Elveria Rising, BA, ACSM CEP, Exercise Physiologist;Kara Eliezer Bottom, MS, ASCM CEP, Exercise Physiologist    Virtual Visit No    Medication changes reported     Yes    Comments off eliquis    Fall or balance concerns reported    No    Warm-up and Cool-down Performed on first and last piece of equipment    Resistance Training Performed Yes    VAD Patient? No    PAD/SET Patient? No      Pain Assessment   Currently in Pain? No/denies                Social History   Tobacco Use  Smoking Status Never  Smokeless Tobacco Never    Goals Met:  Independence with exercise equipment Exercise tolerated well No report of cardiac concerns or symptoms Strength training completed today  Goals Unmet:  Not Applicable  Comments: Pt able to follow exercise prescription today without complaint.  Will continue to monitor for progression.  Yoga completed today by Exercise Physiologist, Nada Maclachlan.    Dr. Emily Filbert is Medical Director for [place].  Dr. Ottie Glazier is Medical Director for [facility] Pulmonary Rehabilitation.

## 2020-10-03 NOTE — Telephone Encounter (Signed)
Called patient to give her BMB date (7/1), but she said that will not work. Pt requesting Friday appt only. IR unable to do 7/15. PT r/s for BMB to 7/22 (per pt request).   Please move follow up appt to be 2 weeks from BMB. Patient will see appt on Mychart.

## 2020-10-03 NOTE — Telephone Encounter (Signed)
Patient called and requested to r/s her CT biopsy on 7/1. She states that she will not be available that day and wanted to see if possible to schedule on 7/15.

## 2020-10-04 ENCOUNTER — Other Ambulatory Visit: Payer: Self-pay

## 2020-10-04 ENCOUNTER — Ambulatory Visit (INDEPENDENT_AMBULATORY_CARE_PROVIDER_SITE_OTHER): Payer: 59 | Admitting: Family Medicine

## 2020-10-04 ENCOUNTER — Encounter: Payer: Self-pay | Admitting: Family Medicine

## 2020-10-04 VITALS — BP 115/65 | HR 67 | Temp 98.5°F | Resp 16 | Wt 220.0 lb

## 2020-10-04 DIAGNOSIS — C541 Malignant neoplasm of endometrium: Secondary | ICD-10-CM | POA: Diagnosis not present

## 2020-10-04 DIAGNOSIS — R809 Proteinuria, unspecified: Secondary | ICD-10-CM | POA: Diagnosis not present

## 2020-10-04 DIAGNOSIS — Z0000 Encounter for general adult medical examination without abnormal findings: Secondary | ICD-10-CM | POA: Diagnosis not present

## 2020-10-04 DIAGNOSIS — E785 Hyperlipidemia, unspecified: Secondary | ICD-10-CM | POA: Diagnosis not present

## 2020-10-04 DIAGNOSIS — D696 Thrombocytopenia, unspecified: Secondary | ICD-10-CM | POA: Diagnosis not present

## 2020-10-04 DIAGNOSIS — Z23 Encounter for immunization: Secondary | ICD-10-CM | POA: Diagnosis not present

## 2020-10-04 DIAGNOSIS — E1169 Type 2 diabetes mellitus with other specified complication: Secondary | ICD-10-CM | POA: Diagnosis not present

## 2020-10-04 DIAGNOSIS — E1129 Type 2 diabetes mellitus with other diabetic kidney complication: Secondary | ICD-10-CM

## 2020-10-04 NOTE — Progress Notes (Signed)
Complete physical exam   Patient: Cindy Trujillo   DOB: [birth year]   65 y.o. Female  MRN: [id number] Visit Date: 10/04/2020  Today's healthcare provider: Vernie Murders, PA-C   No chief complaint on file.  Subjective    Cindy Trujillo is a 65 y.o. female who presents today for a complete physical exam.  She reports consuming a general diet. The patient does not participate in regular exercise at present. She generally feels fairly well. She reports sleeping fairly well. She does not have additional problems to discuss today.   Past Medical History:  Diagnosis Date   Diabetes mellitus without complication ([place])    DVT (deep venous thrombosis) ([place]) 08/2018   right leg   GERD (gastroesophageal reflux disease)    Hyperlipidemia    Past Surgical History:  Procedure Laterality Date   COLONOSCOPY WITH PROPOFOL N/A 08/23/2017   Procedure: COLONOSCOPY WITH PROPOFOL;  Surgeon: Manya Silvas, MD;  Location: [facility] ENDOSCOPY;  Service: Endoscopy;  Laterality: N/A;   ENDOVENOUS ABLATION SAPHENOUS VEIN W/ LASER Left 02/29/2020   endovenous laser ablation left greater saphenous vein and stab phlebectomy 10-20 incisions left leg by Gae Gallop MD    HYSTEROSCOPY WITH D & C N/A 10/31/2019   Procedure: DILATATION AND CURETTAGE /HYSTEROSCOPY WITH MYOSURE RESECTION;  Surgeon: Homero Fellers, MD;  Location: [facility] ORS;  Service: Gynecology;  Laterality: N/A;   ROBOTIC ASSISTED TOTAL HYSTERECTOMY WITH BILATERAL SALPINGO OOPHERECTOMY N/A 12/06/2019   Procedure: XI ROBOTIC ASSISTED TOTAL HYSTERECTOMY WITH BILATERAL SALPINGO OOPHORECTOMY;  Surgeon: Gillis Ends, MD;  Location: [facility] ORS;  Service: Gynecology;  Laterality: N/A;   SENTINEL NODE BIOPSY N/A 12/06/2019   Procedure: SENTINEL NODE BIOPSY;  Surgeon: Gillis Ends, MD;  Location: [facility] ORS;  Service: Gynecology;  Laterality: N/A;   TONSILLECTOMY     TONSILLECTOMY AND ADENOIDECTOMY  1968   VEIN SURGERY  05/2019    laser   Social History   Socioeconomic History   Marital status: Married    Spouse name: Roger   Number of children: 0   Years of education: some college   Highest education level: Not on file  Occupational History   Occupation: CARE MANGEMENT    Employer: [employer]  Tobacco Use   Smoking status: Never   Smokeless tobacco: Never  Vaping Use   Vaping Use: Never used  Substance and Sexual Activity   Alcohol use: No    Alcohol/week: 0.0 standard drinks   Drug use: No   Sexual activity: Not Currently  Other Topics Concern   Not on file  Social History Narrative   Not on file   Social Determinants of Health   Financial Resource Strain: Not on file  Food Insecurity: Not on file  Transportation Needs: Not on file  Physical Activity: Not on file  Stress: Not on file  Social Connections: Not on file  Intimate Partner Violence: Not on file   Family Status  Relation Name Status   Mother  Deceased       complications from lung disease   Father  Alive   Sister  Alive   Brother  Alive   Sister  Alive   Sister  Alive   Brother  Alive   MGM  (Not Specified)   MGF  (Not Specified)   PGM  (Not Specified)   PGF  (Not Specified)   Neg Hx  (Not Specified)   Family History  Problem Relation Age of Onset  COPD Mother        heavy smoker   Pulmonary fibrosis Mother    Fibromyalgia Mother    COPD Father    Melanoma Father    COPD Sister    COPD Brother    COPD Sister    COPD Sister    Healthy Brother    Heart failure Maternal Grandmother    Heart attack Maternal Grandfather    Cancer Paternal Grandmother        unsure of type, had mets in colon   Heart attack Paternal Grandfather    Cancer Paternal Grandfather        unknown   Breast cancer Neg Hx    Cervical cancer Neg Hx    Allergies  Allergen Reactions   Sulfa Antibiotics Hives    Patient Care Team: Virginia Crews, MD as PCP - General (Family Medicine) Clent Jacks, RN as  Oncology Nurse Navigator [place], [place], MD as Referring Physician (Obstetrics) Earlie Server, MD as Consulting Physician (Oncology)   Medications: Outpatient Medications Prior to Visit  Medication Sig   acetaminophen (TYLENOL) 500 MG tablet Take 500 mg by mouth every 6 (six) hours as needed (for pain.).    apixaban (ELIQUIS) 2.5 MG TABS tablet TAKE 1 TABLET BY MOUTH TWICE DAILY   Blood Glucose Monitoring Suppl (FREESTYLE LITE) DEVI    dapagliflozin propanediol (FARXIGA) 10 MG TABS tablet Take 1 tablet (10 mg total) by mouth daily.   ferrous sulfate 325 (65 FE) MG tablet TAKE 1 TABLET BY MOUTH TWICE DAILY   FREESTYLE LITE test strip USE ONE STRIP EVERY MORNING, AT NOON, IN THE EVENING, AND AT BEDTIME. (Patient taking differently: USE ONE STRIP EVERY MORNING, AT NOON, IN THE EVENING, AND AT BEDTIME.)   FreeStyle Unistick II Lancets MISC Use as instructed to check blood glucose daily   glucose blood (FREESTYLE TEST STRIPS) test strip Use as instructed to check blood glucose daily   glucose monitoring kit (FREESTYLE) monitoring kit 1 each by Does not apply route as needed for other.   Lancets (FREESTYLE) lancets    lisinopril (ZESTRIL) 5 MG tablet TAKE 1 TABLET (5 MG TOTAL) BY MOUTH DAILY.   loratadine (CLARITIN) 10 MG tablet Take 1 tablet (10 mg total) by mouth daily.   Magnesium 400 MG TABS Take 1 tablet by mouth daily.   metFORMIN (GLUCOPHAGE) 1000 MG tablet TAKE 1 TABLET BY MOUTH TWICE DAILY WITH A MEAL.   Multiple Vitamin (MULTIVITAMIN WITH MINERALS) TABS tablet Take 1 tablet by mouth 2 (two) times a week.   omeprazole (PRILOSEC) 20 MG capsule Take 1 capsule (20 mg total) by mouth daily.   rosuvastatin (CRESTOR) 5 MG tablet Take 1 tablet (5 mg total) by mouth daily.   vitamin B-12 (CYANOCOBALAMIN) 1000 MCG tablet Take 1 tablet (1,000 mcg total) by mouth daily. (Patient taking differently: Take 1,000 mcg by mouth every evening.)   No facility-administered medications prior to visit.     Review of Systems  Constitutional:  Positive for fatigue.  HENT:  Positive for congestion.        Occasional with allergic rhinitis.  Eyes: Negative.   Respiratory: Negative.    Cardiovascular: Negative.   Gastrointestinal: Negative.   Endocrine: Negative.   Genitourinary: Negative.   Musculoskeletal:  Positive for arthralgias.       Left knee.  Neurological: Negative.   Hematological:  Bruises/bleeds easily.   Last CBC Lab Results  Component Value Date   WBC 3.9 (L) 10/02/2020  HGB 11.7 (L) 10/02/2020   HCT 35.6 (L) 10/02/2020   MCV 83.6 10/02/2020   MCH 27.5 10/02/2020   RDW 15.6 (H) 10/02/2020   PLT 74 (L) 71/69/6789   Last metabolic panel Lab Results  Component Value Date   GLUCOSE 157 (H) 10/02/2020   NA 137 10/02/2020   K 4.4 10/02/2020   CL 105 10/02/2020   CO2 24 10/02/2020   BUN 20 10/02/2020   CREATININE 1.11 (H) 10/02/2020   GFRNONAA 56 (L) 10/02/2020   GFRAA 57 (L) 12/06/2019   CALCIUM 9.5 10/02/2020   PROT 8.0 10/02/2020   ALBUMIN 4.2 10/02/2020   LABGLOB 2.7 06/26/2019   AGRATIO 1.5 06/26/2019   BILITOT 0.9 10/02/2020   ALKPHOS 103 10/02/2020   AST 49 (H) 10/02/2020   ALT 28 10/02/2020   ANIONGAP 8 10/02/2020   Last lipids Lab Results  Component Value Date   CHOL 137 06/26/2019   HDL 51 06/26/2019   LDLCALC 51 06/26/2019   TRIG 220 (H) 06/26/2019   CHOLHDL 2.7 06/26/2019   Last hemoglobin A1c Lab Results  Component Value Date   HGBA1C 6.9 (A) 03/28/2020   Last thyroid functions Lab Results  Component Value Date   TSH 2.750 09/21/2019      Objective    There were no vitals taken for this visit. BP Readings from Last 3 Encounters:  10/04/20 115/65  10/02/20 120/73  07/24/20 115/74   Wt Readings from Last 3 Encounters:  10/04/20 220 lb (99.8 kg)  10/02/20 219 lb 14.4 oz (99.7 kg)  07/24/20 222 lb 14.4 oz (101.1 kg)      Physical Exam Constitutional:      Appearance: She is well-developed.  HENT:     Head:  Normocephalic and atraumatic.     Right Ear: External ear normal.     Left Ear: External ear normal.     Nose: Nose normal.  Eyes:     General:        Right eye: No discharge.     Conjunctiva/sclera: Conjunctivae normal.     Pupils: Pupils are equal, round, and reactive to light.  Neck:     Thyroid: No thyromegaly.     Trachea: No tracheal deviation.  Cardiovascular:     Rate and Rhythm: Normal rate and regular rhythm.     Heart sounds: Normal heart sounds. No murmur heard. Pulmonary:     Effort: Pulmonary effort is normal. No respiratory distress.     Breath sounds: Normal breath sounds. No wheezing or rales.  Chest:     Chest wall: No tenderness.  Abdominal:     General: There is no distension.     Palpations: Abdomen is soft. There is no mass.     Tenderness: There is no abdominal tenderness. There is no guarding or rebound.  Musculoskeletal:        General: No tenderness. Normal range of motion.     Cervical back: Normal range of motion and neck supple.  Lymphadenopathy:     Cervical: No cervical adenopathy.  Skin:    General: Skin is warm and dry.     Findings: No erythema or rash.  Neurological:     Mental Status: She is alert and oriented to person, place, and time.     Cranial Nerves: No cranial nerve deficit.     Motor: No abnormal muscle tone.     Coordination: Coordination normal.     Deep Tendon Reflexes: Reflexes are normal and symmetric. Reflexes normal.  Psychiatric:        Behavior: Behavior normal.        Thought Content: Thought content normal.        Judgment: Judgment normal.    Diabetic Foot Form - Detailed   Diabetic Foot Exam - detailed Diabetic Foot exam was performed with the following findings: Yes 10/04/2020 12:36 PM  Visual Foot Exam completed.: Yes  Can the patient see the bottom of their feet?: Yes Are the shoes appropriate in style and fit?: Yes Is there swelling or and abnormal foot shape?: No Is there a claw toe deformity?: No Is  there elevated skin temparature?: No Is there foot or ankle muscle weakness?: No Normal Range of Motion: Yes Pulse Foot Exam completed.: Yes   Right posterior Tibialias: Present Left posterior Tibialias: Present   Right Dorsalis Pedis: Present Left Dorsalis Pedis: Present  Sensory Foot Exam Completed.: Yes Semmes-Weinstein Monofilament Test R Site 1-Great Toe: Pos L Site 1-Great Toe: Pos          Last depression screening scores PHQ 2/9 Scores 03/28/2020 12/29/2019 09/21/2019  PHQ - 2 Score 0 0 0  PHQ- 9 Score 0 2 0   Last fall risk screening Fall Risk  03/28/2020  Falls in the past year? 0  Number falls in past yr: 0  Injury with Fall? 0  Risk for fall due to : No Fall Risks  Follow up Falls evaluation completed   Last Audit-C alcohol use screening Alcohol Use Disorder Test (AUDIT) 03/28/2020  1. How often do you have a drink containing alcohol? 0  2. How many drinks containing alcohol do you have on a typical day when you are drinking? 0  3. How often do you have six or more drinks on one occasion? 0  AUDIT-C Score 0  Alcohol Brief Interventions/Follow-up AUDIT Score <7 follow-up not indicated   A score of 3 or more in women, and 4 or more in men indicates increased risk for alcohol abuse, EXCEPT if all of the points are from question 1   No results found for any visits on 10/04/20.  Assessment & Plan    Routine Health Maintenance and Physical Exam  Exercise Activities and Dietary recommendations  Goals   Recommend continuing to was for exercise 30-40 minutes 3-4 times a week and work on weight loss with low fat diabetic diet.     Immunization History  Administered Date(s) Administered   Influenza,inj,Quad PF,6+ Mos 01/05/2017   Influenza-Unspecified 02/08/2020   PFIZER(Purple Top)SARS-COV-2 Vaccination 06/09/2019, 06/30/2019, 02/23/2020, 08/09/2020   Pneumococcal Polysaccharide-23 03/08/2017   Tdap 03/08/2017   Zoster Recombinat (Shingrix) 03/20/2019,  05/22/2019    Health Maintenance  Topic Date Due   Pneumococcal Vaccine 58-91 Years old (1 - PCV) Never done   FOOT EXAM  06/25/2020   HEMOGLOBIN A1C  09/26/2020   MAMMOGRAM  10/23/2020 (Originally 12/29/2007)   INFLUENZA VACCINE  11/11/2020   COVID-19 Vaccine (5 - Booster for Pfizer series) 12/09/2020   OPHTHALMOLOGY EXAM  04/02/2021   PAP SMEAR-Modifier  09/21/2022   TETANUS/TDAP  03/09/2027   COLONOSCOPY (Pts 45-27yr Insurance coverage will need to be confirmed)  08/24/2027   PNEUMOCOCCAL POLYSACCHARIDE VACCINE AGE 70-64 HIGH RISK  Completed   Hepatitis C Screening  Completed   HIV Screening  Completed   Zoster Vaccines- Shingrix  Completed   HPV VACCINES  Aged Out    Discussed health benefits of physical activity, and encouraged her to engage in regular exercise appropriate for her age and  condition.  1. Annual physical exam General health stable with some allergic rhinitis recently. Check routine labs. Counseled regarding health maintenance. - CBC with Differential/Platelet - Comprehensive metabolic panel - Lipid panel - Hemoglobin A1c - TSH  2. 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 Pathologic stage from 12/25/2019: Stage IA (pT1a, pN0, cM0) - followed by Earlie Server, MD (oncologist/hematologist.  3. Thrombocytopenia ([place]) Followed by Dr. Tasia Catchings (hematologist). - CBC with Differential/Platelet - Comprehensive metabolic panel  4. Type 2 diabetes mellitus with microalbuminuria, without long-term current use of insulin (HCC) Controlled by Wilder Glade 10 mg qd, Metfromin 1000 mg BID and Lisinopril with Rosuvastatin. Encouraged to get annual ophthalmology exam. - CBC with Differential/Platelet - Comprehensive metabolic panel - Hemoglobin A1c -  Pneumococcal conjugate vaccine 20-valent (Prevnar 20)  5. Need for Streptococcus pneumoniae vaccination - Pneumococcal conjugate vaccine 20-valent (Prevnar 20)  6. Hyperlipidemia associated with type 2 diabetes mellitus (HCC) Tolerating Rosuvastatin 5 mg qd and trying to follow the low fat diabetic diet. Recommend regular exercise and increased water intake. Last lipid panel showed triglycerides 220 and improvement with use of statin.    No follow-ups on file.     I, Schneider Warchol, PA-C, have reviewed all documentation for this visit. The documentation on 10/04/20 for the exam, diagnosis, procedures, and orders are all accurate and complete.    Vernie Murders, PA-C  [employer] [phone] (phone) [phone] (fax)  [place]

## 2020-10-05 LAB — CBC WITH DIFFERENTIAL/PLATELET
Basophils Absolute: 0 10*3/uL (ref 0.0–0.2)
Basos: 0 %
EOS (ABSOLUTE): 0.1 10*3/uL (ref 0.0–0.4)
Eos: 2 %
Hematocrit: 35.9 % (ref 34.0–46.6)
Hemoglobin: 12.2 g/dL (ref 11.1–15.9)
Immature Grans (Abs): 0 10*3/uL (ref 0.0–0.1)
Immature Granulocytes: 0 %
Lymphocytes Absolute: 1.7 10*3/uL (ref 0.7–3.1)
Lymphs: 36 %
MCH: 28.2 pg (ref 26.6–33.0)
MCHC: 34 g/dL (ref 31.5–35.7)
MCV: 83 fL (ref 79–97)
Monocytes Absolute: 0.3 10*3/uL (ref 0.1–0.9)
Monocytes: 6 %
Neutrophils Absolute: 2.6 10*3/uL (ref 1.4–7.0)
Neutrophils: 56 %
Platelets: 92 10*3/uL — CL (ref 150–450)
RBC: 4.33 x10E6/uL (ref 3.77–5.28)
RDW: 15.3 % (ref 11.7–15.4)
WBC: 4.7 10*3/uL (ref 3.4–10.8)

## 2020-10-05 LAB — LIPID PANEL
Chol/HDL Ratio: 2.9 ratio (ref 0.0–4.4)
Cholesterol, Total: 146 mg/dL (ref 100–199)
HDL: 50 mg/dL (ref >39)
LDL Chol Calc (NIH): 62 mg/dL (ref 0–99)
Triglycerides: 211 mg/dL — ABNORMAL HIGH (ref 0–149)
VLDL Cholesterol Cal: 34 mg/dL (ref 5–40)

## 2020-10-05 LAB — COMPREHENSIVE METABOLIC PANEL
ALT: 26 IU/L (ref 0–32)
AST: 49 IU/L — ABNORMAL HIGH (ref 0–40)
Albumin/Globulin Ratio: 1.4 (ref 1.2–2.2)
Albumin: 4.4 g/dL (ref 3.8–4.8)
Alkaline Phosphatase: 115 IU/L (ref 44–121)
BUN/Creatinine Ratio: 14 (ref 12–28)
BUN: 17 mg/dL (ref 8–27)
Bilirubin Total: 1.1 mg/dL (ref 0.0–1.2)
CO2: 20 mmol/L (ref 20–29)
Calcium: 9.9 mg/dL (ref 8.7–10.3)
Chloride: 102 mmol/L (ref 96–106)
Creatinine, Ser: 1.24 mg/dL — ABNORMAL HIGH (ref 0.57–1.00)
Globulin, Total: 3.1 g/dL (ref 1.5–4.5)
Glucose: 134 mg/dL — ABNORMAL HIGH (ref 65–99)
Potassium: 4.1 mmol/L (ref 3.5–5.2)
Sodium: 136 mmol/L (ref 134–144)
Total Protein: 7.5 g/dL (ref 6.0–8.5)
eGFR: 49 mL/min/{1.73_m2} — ABNORMAL LOW (ref >59)

## 2020-10-05 LAB — HEMOGLOBIN A1C
Est. average glucose Bld gHb Est-mCnc: 177 mg/dL
Hgb A1c MFr Bld: 7.8 % — ABNORMAL HIGH (ref 4.8–5.6)

## 2020-10-05 LAB — TSH: TSH: 2.83 u[IU]/mL (ref 0.450–4.500)

## 2020-10-08 ENCOUNTER — Other Ambulatory Visit: Payer: Self-pay

## 2020-10-08 DIAGNOSIS — C541 Malignant neoplasm of endometrium: Secondary | ICD-10-CM

## 2020-10-08 NOTE — Progress Notes (Signed)
Daily Session Note  Patient Details  Name: Cindy Trujillo MRN: [id number] Date of Birth: 05/08/1955 Referring Provider:   Flowsheet Row Cancer Associated Rehabilitation & Exercise from 08/13/2020 in [facility] Re[facility]  Referring Provider Yu, Zhou MD       Encounter Date: 10/08/2020  Check In:  Session Check In - 10/08/20 1236       Check-In   Supervising physician immediately available to respond to emergencies See telemetry face sheet for immediately available ER MD    Location [facility]    Staff Present Kelly Bollinger, MPA, RN;Kara Langdon, MS, ASCM CEP, Exercise Physiologist;Jessica Hawkins, MA, RCEP, CCRP, CCET    Virtual Visit No    Medication changes reported     No    Fall or balance concerns reported    No    Warm-up and Cool-down Performed on first and last piece of equipment    Resistance Training Performed Yes    VAD Patient? No    PAD/SET Patient? No      Pain Assessment   Currently in Pain? No/denies                Social History   Tobacco Use  Smoking Status Never  Smokeless Tobacco Never    Goals Met:  Independence with exercise equipment Exercise tolerated well No report of cardiac concerns or symptoms Strength training completed today  Goals Unmet:  Not Applicable  Comments: Pt able to follow exercise prescription today without complaint.  Will continue to monitor for progression.    Dr. Mark Miller is Medical Director for [facility].  Dr. Fuad Aleskerov is Medical Director for [facility]. 

## 2020-10-10 ENCOUNTER — Other Ambulatory Visit: Payer: Self-pay

## 2020-10-10 DIAGNOSIS — C541 Malignant neoplasm of endometrium: Secondary | ICD-10-CM

## 2020-10-10 NOTE — Progress Notes (Signed)
Daily Session Note  Patient Details  Name: Cindy Trujillo MRN: [id number] Date of Birth: 1955/06/07 Referring Provider:   April Manson Cancer Associated Rehabilitation & Exercise from 08/13/2020 in [facility] Card[facility] Pul[facility] Re[facility]  Referring Provider Earlie Server MD       Encounter Date: 10/10/2020  Check In:  Session Check In - 10/10/20 1258       Check-In   Supervising physician immediately available to respond to emergencies See telemetry face sheet for immediately available ER MD    Location [facility]    Staff Present Birdie Sons, MPA, Nino [place], MS, ASCM CEP, Exercise Physiologist;Amanda Oletta Darter, BA, ACSM CEP, Exercise Physiologist    Virtual Visit No    Medication changes reported     No    Fall or balance concerns reported    No    Warm-up and Cool-down Performed on first and last piece of equipment    Resistance Training Performed Yes    VAD Patient? No    PAD/SET Patient? No      Pain Assessment   Currently in Pain? No/denies                Social History   Tobacco Use  Smoking Status Never  Smokeless Tobacco Never    Goals Met:  Independence with exercise equipment Exercise tolerated well No report of cardiac concerns or symptoms Strength training completed today  Goals Unmet:  Not Applicable  Comments: Pt able to follow exercise prescription today without complaint.  Will continue to monitor for progression.    Dr. Emily Filbert is Medical Director for [facility].  Dr. Ottie Glazier is Medical Director for [facility].

## 2020-10-11 ENCOUNTER — Ambulatory Visit: Payer: 59

## 2020-10-17 ENCOUNTER — Other Ambulatory Visit: Payer: Self-pay

## 2020-10-17 ENCOUNTER — Encounter: Payer: 59 | Attending: Family Medicine

## 2020-10-17 DIAGNOSIS — C541 Malignant neoplasm of endometrium: Secondary | ICD-10-CM | POA: Insufficient documentation

## 2020-10-17 NOTE — Progress Notes (Signed)
Daily Session Note  Patient Details  Name: Cindy Trujillo MRN: [id number] Date of Birth: 1955-05-18 Referring Provider:   April Manson Cancer Associated Rehabilitation & Exercise from 08/13/2020 in [facility] Cardiac and Pulmonary Rehab  Referring Provider Earlie Server MD       Encounter Date: 10/17/2020  Check In:  Session Check In - 10/17/20 1228       Check-In   Supervising physician immediately available to respond to emergencies See telemetry face sheet for immediately available ER MD    Location [facility]    Staff Present Birdie Sons, MPA, RN;Jessica Luan Pulling, MA, RCEP, CCRP, Marylynn Pearson, MS, ASCM CEP, Exercise Physiologist    Virtual Visit No    Medication changes reported     No    Fall or balance concerns reported    No    Warm-up and Cool-down Performed on first and last piece of equipment    Resistance Training Performed Yes    VAD Patient? No    PAD/SET Patient? No      Pain Assessment   Currently in Pain? No/denies                Social History   Tobacco Use  Smoking Status Never  Smokeless Tobacco Never    Goals Met:  Independence with exercise equipment Exercise tolerated well No report of cardiac concerns or symptoms Strength training completed today  Goals Unmet:  Not Applicable  Comments: Pt able to follow exercise prescription today without complaint.  Will continue to monitor for progression.    Dr. Emily Filbert is Medical Director for [facility].  Dr. Ottie Glazier is Medical Director for [facility].

## 2020-10-18 ENCOUNTER — Other Ambulatory Visit (HOSPITAL_COMMUNITY): Payer: Self-pay

## 2020-10-22 ENCOUNTER — Other Ambulatory Visit: Payer: Self-pay

## 2020-10-22 DIAGNOSIS — C541 Malignant neoplasm of endometrium: Secondary | ICD-10-CM

## 2020-10-22 NOTE — Progress Notes (Signed)
Daily Session Note  Patient Details  Name: Cindy Trujillo MRN: [id number] Date of Birth: [birth year] Referring Provider:   April Manson Cancer Associated Rehabilitation & Exercise from 08/13/2020 in [facility] Cardiac and Pulmonary Rehab  Referring Provider Earlie Server MD       Encounter Date: 10/22/2020  Check In:  Session Check In - 10/22/20 1225       Check-In   Supervising physician immediately available to respond to emergencies See telemetry face sheet for immediately available ER MD    Location [facility]    Staff Present Birdie Sons, MPA, RN;Jessica Ruby, MA, RCEP, CCRP, Marylynn Pearson, MS, ASCM CEP, Exercise Physiologist    Virtual Visit No    Medication changes reported     No    Fall or balance concerns reported    No    Warm-up and Cool-down Performed on first and last piece of equipment    Resistance Training Performed Yes    VAD Patient? No    PAD/SET Patient? No      Pain Assessment   Currently in Pain? No/denies                Social History   Tobacco Use  Smoking Status Never  Smokeless Tobacco Never    Goals Met:  Independence with exercise equipment Exercise tolerated well No report of cardiac concerns or symptoms Strength training completed today  Goals Unmet:  Not Applicable  Comments: Pt able to follow exercise prescription today without complaint.  Will continue to monitor for progression.    Dr. Emily Filbert is Medical Director for [facility].  Dr. Ottie Glazier is Medical Director for [facility] Pulmonary [facility].

## 2020-10-24 ENCOUNTER — Other Ambulatory Visit: Payer: Self-pay

## 2020-10-24 ENCOUNTER — Encounter: Payer: 59 | Admitting: *Deleted

## 2020-10-24 DIAGNOSIS — C541 Malignant neoplasm of endometrium: Secondary | ICD-10-CM

## 2020-10-24 NOTE — Progress Notes (Signed)
Daily Session Note  Patient Details  Name: Cindy Trujillo MRN: [id number] Date of Birth: Sep 02, 1955 Referring Provider:   April Manson Cancer Associated Rehabilitation & Exercise from 08/13/2020 in [facility] Card[facility] and Pulmonary Rehab  Referring Provider Earlie Server MD       Encounter Date: 10/24/2020  Check In:  Session Check In - 10/24/20 1240       Check-In   Supervising physician immediately available to respond to emergencies See telemetry face sheet for immediately available ER MD    Location [facility]    Staff Present Alberteen Sam, MA, RCEP, CCRP, Marylynn Pearson, MS, ASCM CEP, Exercise Physiologist    Virtual Visit No    Medication changes reported     No    Fall or balance concerns reported    No    Warm-up and Cool-down Performed on first and last piece of equipment    Resistance Training Performed Yes    VAD Patient? No    PAD/SET Patient? No      Pain Assessment   Currently in Pain? No/denies                Social History   Tobacco Use  Smoking Status Never  Smokeless Tobacco Never    Goals Met:  Proper associated with RPD/PD & O2 Sat Independence with exercise equipment Exercise tolerated well No report of cardiac concerns or symptoms Strength training completed today  Goals Unmet:  Not Applicable  Comments: Pt able to follow exercise prescription today without complaint.  Will continue to monitor for progression.    Dr. Emily Filbert is Medical Director for [facility].  Dr. Ottie Glazier is Medical Director for [facility] Pulmonary Rehabilitation.

## 2020-10-28 ENCOUNTER — Ambulatory Visit: Payer: 59 | Admitting: Oncology

## 2020-10-28 ENCOUNTER — Other Ambulatory Visit: Payer: Self-pay | Admitting: Radiology

## 2020-10-28 NOTE — Progress Notes (Signed)
Patient for [facility] 11/01/2020, called and spoke with patient's husband on phone with pre procedure instructions given. Made aware to be here @ 0730, NPO after MN prior to procedure and driver post procedure/recovery/discharge. Stated understanding. Will also  hold am dose of Metformin that day of procedure.

## 2020-10-29 ENCOUNTER — Other Ambulatory Visit: Payer: Self-pay

## 2020-10-29 ENCOUNTER — Encounter: Payer: 59 | Admitting: *Deleted

## 2020-10-29 DIAGNOSIS — C541 Malignant neoplasm of endometrium: Secondary | ICD-10-CM

## 2020-10-29 NOTE — Progress Notes (Signed)
Daily Session Note  Patient Details  Name: Cindy Trujillo MRN: [id number] Date of Birth: Sep 29, 1955 Referring Provider:   April Manson Cancer Associated Rehabilitation & Exercise from 08/13/2020 in [facility] Cardiac and Pulmonary Rehab  Referring Provider Earlie Server MD       Encounter Date: 10/29/2020  Check In:  Session Check In - 10/29/20 1242       Check-In   Supervising physician immediately available to respond to emergencies See telemetry face sheet for immediately available ER MD    Location [facility]-Cardiac & Pulmonary Rehab    Staff Present Alberteen Sam, MA, RCEP, CCRP, CCET;Melissa Penn Wynne, RDN, Tawanna Solo, MS, ASCM CEP, Exercise Physiologist    Virtual Visit No    Medication changes reported     No    Fall or balance concerns reported    No    Tobacco Cessation No Change    Warm-up and Cool-down Performed on first and last piece of equipment    Resistance Training Performed Yes    VAD Patient? No    PAD/SET Patient? No      Pain Assessment   Currently in Pain? No/denies                Social History   Tobacco Use  Smoking Status Never  Smokeless Tobacco Never    Goals Met:  Proper associated with RPD/PD & O2 Sat Independence with exercise equipment Exercise tolerated well No report of cardiac concerns or symptoms Strength training completed today  Goals Unmet:  Not Applicable  Comments: Pt able to follow exercise prescription today without complaint.  Will continue to monitor for progression.    Dr. Emily Filbert is Medical Director for [place].  Dr. Ottie Glazier is Medical Director for [facility] Pulmonary Rehabilitation.

## 2020-10-31 ENCOUNTER — Other Ambulatory Visit: Payer: Self-pay | Admitting: Radiology

## 2020-10-31 ENCOUNTER — Other Ambulatory Visit: Payer: Self-pay

## 2020-10-31 DIAGNOSIS — C541 Malignant neoplasm of endometrium: Secondary | ICD-10-CM

## 2020-10-31 NOTE — Progress Notes (Signed)
Daily Session Note  Patient Details  Name: Cindy Trujillo MRN: [id number] Date of Birth: 02-01-1956 Referring Provider:   April Manson Cancer Associated Rehabilitation & Exercise from 08/13/2020 in [facility] Cardiac and Pulmonary Rehab  Referring Provider Earlie Server MD       Encounter Date: 10/31/2020  Check In:  Session Check In - 10/31/20 [place]       Check-In   Supervising physician immediately available to respond to emergencies See telemetry face sheet for immediately available ER MD    Location [facility]-Cardiac & Pulmonary Rehab    Staff Present Birdie Sons, MPA, Nino Glow, MS, ASCM CEP, Exercise Physiologist;Amanda Oletta Darter, BA, ACSM CEP, Exercise Physiologist    Virtual Visit No    Medication changes reported     No    Fall or balance concerns reported    No    Tobacco Cessation No Change    Warm-up and Cool-down Performed on first and last piece of equipment    Resistance Training Performed Yes    VAD Patient? No    PAD/SET Patient? No      Pain Assessment   Currently in Pain? No/denies                Social History   Tobacco Use  Smoking Status Never  Smokeless Tobacco Never    Goals Met:  Independence with exercise equipment Exercise tolerated well No report of cardiac concerns or symptoms Strength training completed today  Goals Unmet:  Not Applicable  Comments: Pt able to follow exercise prescription today without complaint.  Will continue to monitor for progression.    Dr. Emily Filbert is Medical Director for [place].  Dr. Ottie Glazier is Medical Director for [facility] Pulmonary Rehabilitation.

## 2020-11-01 ENCOUNTER — Other Ambulatory Visit: Payer: Self-pay

## 2020-11-01 ENCOUNTER — Ambulatory Visit
Admission: RE | Admit: 2020-11-01 | Discharge: 2020-11-01 | Disposition: A | Discharge status: Home / Self Care | Payer: 59 | Source: Ambulatory Visit | Attending: Oncology | Admitting: Oncology

## 2020-11-01 DIAGNOSIS — D7589 Other specified diseases of blood and blood-forming organs: Secondary | ICD-10-CM | POA: Insufficient documentation

## 2020-11-01 DIAGNOSIS — C541 Malignant neoplasm of endometrium: Secondary | ICD-10-CM | POA: Diagnosis not present

## 2020-11-01 DIAGNOSIS — D696 Thrombocytopenia, unspecified: Secondary | ICD-10-CM | POA: Insufficient documentation

## 2020-11-01 HISTORY — DX: Anemia, unspecified: D64.9

## 2020-11-01 LAB — CBC WITH DIFFERENTIAL/PLATELET
Abs Immature Granulocytes: 0.01 10*3/uL (ref 0.00–0.07)
Basophils Absolute: 0 10*3/uL (ref 0.0–0.1)
Basophils Relative: 0 %
Eosinophils Absolute: 0.1 10*3/uL (ref 0.0–0.5)
Eosinophils Relative: 3 %
HCT: 36.5 % (ref 36.0–46.0)
Hemoglobin: 12.2 g/dL (ref 12.0–15.0)
Immature Granulocytes: 0 %
Lymphocytes Relative: 35 %
Lymphs Abs: 1.5 10*3/uL (ref 0.7–4.0)
MCH: 28.2 pg (ref 26.0–34.0)
MCHC: 33.4 g/dL (ref 30.0–36.0)
MCV: 84.5 fL (ref 80.0–100.0)
Monocytes Absolute: 0.3 10*3/uL (ref 0.1–1.0)
Monocytes Relative: 6 %
Neutro Abs: 2.3 10*3/uL (ref 1.7–7.7)
Neutrophils Relative %: 56 %
Platelets: 85 10*3/uL — ABNORMAL LOW (ref 150–400)
RBC: 4.32 MIL/uL (ref 3.87–5.11)
RDW: 14.5 % (ref 11.5–15.5)
WBC: 4.1 10*3/uL (ref 4.0–10.5)
nRBC: 0 % (ref 0.0–0.2)

## 2020-11-01 LAB — GLUCOSE, CAPILLARY: Glucose-Capillary: 184 mg/dL — ABNORMAL HIGH (ref 70–99)

## 2020-11-01 MED ORDER — FENTANYL CITRATE (PF) 100 MCG/2ML IJ SOLN
INTRAMUSCULAR | Status: AC | PRN
  Administered 2020-11-01: 25 ug via INTRAVENOUS
  Administered 2020-11-01: 50 ug via INTRAVENOUS

## 2020-11-01 MED ORDER — MIDAZOLAM HCL 2 MG/2ML IJ SOLN
INTRAMUSCULAR | Status: AC | PRN
  Administered 2020-11-01: 1 mg via INTRAVENOUS
  Administered 2020-11-01: 0.5 mg via INTRAVENOUS

## 2020-11-01 MED ORDER — SODIUM CHLORIDE 0.9 % IV SOLN: INTRAVENOUS | Status: DC

## 2020-11-01 NOTE — Procedures (Signed)
Interventional Radiology Procedure Note  Procedure: Bone marrow biopsy and aspiration   Indication: Thrombocytopenia  Findings: Please refer to procedural dictation for full description.  Complications: None  EBL: < 10 mL  Miachel Roux, MD [phone]

## 2020-11-01 NOTE — Progress Notes (Signed)
Patient clinically stable post BMB per Dr Dwaine Gale, tolerated well. Denies complaints post procedure. Awake/alert and oriented post procedure. Vitals stable. Report given to Carlynn Spry RN in specials post procedure. Received Versed 1.5 mg along with Fentanyl 75 mcg IV for procedure.

## 2020-11-01 NOTE — Consult Note (Signed)
Chief Complaint: Patient was seen in consultation today for thrombocytopenia  at the request of Yu,Zhou  Referring Physician(s): Yu,Zhou  Patient Status: [facility] - Out-pt  History of Present Illness: Cindy Trujillo is a 65 y.o. female with history of thrombocytopenia presents to IR for bone marrow biopsy and aspiration.  She feels well today. She denies chest pain or SOB.  Past Medical History:  Diagnosis Date   Anemia    Diabetes mellitus without complication ([place])    DVT (deep venous thrombosis) ([place]) 08/2018   right leg   GERD (gastroesophageal reflux disease)    Hyperlipidemia     Past Surgical History:  Procedure Laterality Date   COLONOSCOPY WITH PROPOFOL N/A 08/23/2017   Procedure: COLONOSCOPY WITH PROPOFOL;  Surgeon: Manya Silvas, MD;  Location: [facility] ENDOSCOPY;  Service: Endoscopy;  Laterality: N/A;   ENDOVENOUS ABLATION SAPHENOUS VEIN W/ LASER Left 02/29/2020   endovenous laser ablation left greater saphenous vein and stab phlebectomy 10-20 incisions left leg by Gae Gallop MD    HYSTEROSCOPY WITH D & C N/A 10/31/2019   Procedure: DILATATION AND CURETTAGE /HYSTEROSCOPY WITH MYOSURE RESECTION;  Surgeon: Homero Fellers, MD;  Location: [facility] ORS;  Service: Gynecology;  Laterality: N/A;   ROBOTIC ASSISTED TOTAL HYSTERECTOMY WITH BILATERAL SALPINGO OOPHERECTOMY N/A 12/06/2019   Procedure: XI ROBOTIC ASSISTED TOTAL HYSTERECTOMY WITH BILATERAL SALPINGO OOPHORECTOMY;  Surgeon: Gillis Ends, MD;  Location: [facility] ORS;  Service: Gynecology;  Laterality: N/A;   SENTINEL NODE BIOPSY N/A 12/06/2019   Procedure: SENTINEL NODE BIOPSY;  Surgeon: Gillis Ends, MD;  Location: [facility] ORS;  Service: Gynecology;  Laterality: N/A;   TONSILLECTOMY     TONSILLECTOMY AND ADENOIDECTOMY  1968   VEIN SURGERY  05/2019   laser    Allergies: Sulfa antibiotics  Medications: Prior to Admission medications   Medication Sig Start Date End Date Taking? Authorizing  Provider  dapagliflozin propanediol (FARXIGA) 10 MG TABS tablet Take 1 tablet (10 mg total) by mouth daily. 03/28/20  Yes Bacigalupo, Dionne Bucy, MD  ferrous sulfate 325 (65 FE) MG tablet TAKE 1 TABLET BY MOUTH TWICE DAILY 07/04/20 07/04/21 Yes Earlie Server, MD  lisinopril (ZESTRIL) 5 MG tablet TAKE 1 TABLET (5 MG TOTAL) BY MOUTH DAILY. 03/28/20 03/28/21 Yes Bacigalupo, Dionne Bucy, MD  loratadine (CLARITIN) 10 MG tablet Take 1 tablet (10 mg total) by mouth daily. 03/28/20  Yes Bacigalupo, Dionne Bucy, MD  Magnesium 400 MG TABS Take 1 tablet by mouth daily.   Yes [provider]  metFORMIN (GLUCOPHAGE) 1000 MG tablet TAKE 1 TABLET BY MOUTH TWICE DAILY WITH A MEAL. 03/28/20 03/28/21 Yes Bacigalupo, Dionne Bucy, MD  Multiple Vitamin (MULTIVITAMIN WITH MINERALS) TABS tablet Take 1 tablet by mouth 2 (two) times a week.   Yes [provider]  omeprazole (PRILOSEC) 20 MG capsule Take 1 capsule (20 mg total) by mouth daily. 03/28/20  Yes Bacigalupo, Dionne Bucy, MD  rosuvastatin (CRESTOR) 5 MG tablet Take 1 tablet (5 mg total) by mouth daily. 03/28/20  Yes Bacigalupo, Dionne Bucy, MD  vitamin B-12 (CYANOCOBALAMIN) 1000 MCG tablet Take 1 tablet (1,000 mcg total) by mouth daily. Patient taking differently: Take 1,000 mcg by mouth every evening. 03/21/19  Yes Earlie Server, MD  acetaminophen (TYLENOL) 500 MG tablet Take 500 mg by mouth every 6 (six) hours as needed (for pain.).     [provider]  apixaban (ELIQUIS) 2.5 MG TABS tablet TAKE 1 TABLET BY MOUTH TWICE DAILY Patient not taking: Reported on 11/01/2020  03/28/20 03/28/21  Virginia Crews, MD  Blood Glucose Monitoring Suppl (FREESTYLE LITE) [facility]  06/26/19   [provider]  FREESTYLE LITE test strip USE ONE STRIP EVERY MORNING, AT NOON, IN THE EVENING, AND AT BEDTIME. Patient taking differently: USE ONE STRIP EVERY MORNING, AT NOON, IN THE EVENING, AND AT BEDTIME. 12/08/19 12/07/20  Adrian Prows R, MD  FreeStyle Unistick II Lancets  MISC Use as instructed to check blood glucose daily 06/26/19   Virginia Crews, MD  glucose blood (FREESTYLE TEST STRIPS) test strip Use as instructed to check blood glucose daily 06/26/19   Virginia Crews, MD  glucose monitoring kit (FREESTYLE) monitoring kit 1 each by Does not apply route as needed for other. 06/26/19   Virginia Crews, MD  Lancets (FREESTYLE) lancets  06/26/19   [provider]     Family History  Problem Relation Age of Onset   COPD Mother        heavy smoker   Pulmonary fibrosis Mother    Fibromyalgia Mother    COPD Father    Melanoma Father    COPD Sister    COPD Brother    COPD Sister    COPD Sister    Healthy Brother    Heart failure Maternal Grandmother    Heart attack Maternal Grandfather    Cancer Paternal Grandmother        unsure of type, had mets in colon   Heart attack Paternal Grandfather    Cancer Paternal Grandfather        unknown   Breast cancer Neg Hx    Cervical cancer Neg Hx     Social History   Socioeconomic History   Marital status: Married    Spouse name: Dance movement psychotherapist   Number of children: 0   Years of education: some college   Highest education level: Not on file  Occupational History   Occupation: CARE MANGEMENT    Employer: [employer]  Tobacco Use   Smoking status: Never   Smokeless tobacco: Never  Vaping Use   Vaping Use: Never used  Substance and Sexual Activity   Alcohol use: No    Alcohol/week: 0.0 standard drinks   Drug use: No   Sexual activity: Not Currently    Birth control/protection: Surgical  Other Topics Concern   Not on file  Social History Narrative   Not on file   Social Determinants of Health   Financial Resource Strain: Not on file  Food Insecurity: Not on file  Transportation Needs: Not on file  Physical Activity: Not on file  Stress: Not on file  Social Connections: Not on file   Review of Systems: A 12 point ROS discussed and pertinent positives are  indicated in the HPI above.  All other systems are negative.  Review of Systems  Vital Signs: BP 126/69   Pulse 79   Resp 18   Ht 5' 4" (1.626 m)   Wt 98.9 kg   SpO2 96%   BMI 37.42 kg/m   Physical Exam Constitutional:      Appearance: Normal appearance.  Cardiovascular:     Rate and Rhythm: Normal rate and regular rhythm.     Heart sounds: Normal heart sounds.  Pulmonary:     Effort: Pulmonary effort is normal.     Breath sounds: Normal breath sounds.  Abdominal:     Palpations: Abdomen is soft.  Neurological:     Mental Status: She is alert and oriented to  person, place, and time.  Psychiatric:        Mood and Affect: Mood normal.    Imaging: No results found.  Labs:  CBC: Recent Labs    07/01/20 1246 10/02/20 1240 10/04/20 1306 11/01/20 0741  WBC 3.4* 3.9* 4.7 4.1  HGB 10.9* 11.7* 12.2 12.2  HCT 34.0* 35.6* 35.9 36.5  PLT 85* 74* 92* 85*    COAGS: No results for input(s): INR, APTT in the last 8760 hours.  BMP: Recent Labs    11/23/19 0806 12/06/19 1802 01/24/20 1424 07/01/20 1246 10/02/20 1240 10/04/20 1306  NA 139 137 140 137 137 136  K 4.2 4.5 4.4 4.1 4.4 4.1  CL 109 107 109 106 105 102  CO2 20* 21* _0 GLUCOSE 186* 170* 125* 195* 157* 134*  BUN _1 CALCIUM 9.2 8.4* 9.0 8.9 9.5 9.9  CREATININE 1.12* 1.19* 1.16* 1.21* 1.11* 1.24*  GFRNONAA 53* 49* 50* 51* 56*  --   GFRAA >60 57*  --   --   --   --     LIVER FUNCTION TESTS: Recent Labs    01/24/20 1424 07/01/20 1246 10/02/20 1240 10/04/20 1306  BILITOT 0.8 1.1 0.9 1.1  AST 52* 44* 49* 49*  ALT _2 ALKPHOS 81 90 103 115  PROT 7.6 7.6 8.0 7.5  ALBUMIN 4.0 3.8 4.2 4.4    TUMOR MARKERS: No results for input(s): AFPTM, CEA, CA199, CHROMGRNA in the last 8760 hours.  Assessment and Plan:  65 year old woman with history of thrombocytopenia presents to IR for bone marrow biopsy and aspiration.  Risks and benefits of Bone marrow biopsy and  aspiration was discussed with the patient and/or patient's family including, but not limited to bleeding, infection, damage to adjacent structures or low yield requiring additional tests.  All of the questions were answered and there is agreement to proceed.  Consent signed and in chart.  Thank you for this interesting consult.  I greatly enjoyed meeting Cindy Trujillo and look forward to participating in their care.  A copy of this report was sent to the requesting provider on this date.  Electronically Signed: Paula Libra Mir, MD 11/01/2020, 8:18 AM   I spent a total of  30 Minutes  in face to face in clinical consultation, greater than 50% of which was counseling/coordinating care for Bone marrow biopsy and aspiration

## 2020-11-05 ENCOUNTER — Other Ambulatory Visit: Payer: Self-pay

## 2020-11-05 DIAGNOSIS — C541 Malignant neoplasm of endometrium: Secondary | ICD-10-CM

## 2020-11-05 NOTE — Progress Notes (Signed)
Daily Session Note  Patient Details  Name: Cindy Trujillo MRN: [id number] Date of Birth: 01-03-1956 Referring Provider:   April Manson Cancer Associated Rehabilitation & Exercise from 08/13/2020 in [facility] Cardiac and Pulmonary Rehab  Referring Provider Earlie Server MD       Encounter Date: 11/05/2020  Check In:  Session Check In - 11/05/20 1224       Check-In   Supervising physician immediately available to respond to emergencies See telemetry face sheet for immediately available ER MD    Location [facility]-Cardiac & Pulmonary Rehab    Staff Present Birdie Sons, MPA, RN;Jessica [place], MA, RCEP, CCRP, Marylynn Pearson, MS, ASCM CEP, Exercise Physiologist    Virtual Visit No    Medication changes reported     No    Fall or balance concerns reported    No    Tobacco Cessation No Change    Warm-up and Cool-down Performed on first and last piece of equipment    Resistance Training Performed Yes    VAD Patient? No    PAD/SET Patient? No      Pain Assessment   Currently in Pain? No/denies                Social History   Tobacco Use  Smoking Status Never  Smokeless Tobacco Never    Goals Met:  Independence with exercise equipment Exercise tolerated well No report of cardiac concerns or symptoms Strength training completed today  Goals Unmet:  Not Applicable  Comments: Pt able to follow exercise prescription today without complaint.  Will continue to monitor for progression.    Dr. Emily Filbert is Medical Director for [place].  Dr. Ottie Glazier is Medical Director for [facility] Pulmonary Rehabilitation.

## 2020-11-07 ENCOUNTER — Other Ambulatory Visit: Payer: Self-pay

## 2020-11-07 DIAGNOSIS — C541 Malignant neoplasm of endometrium: Secondary | ICD-10-CM

## 2020-11-07 NOTE — Progress Notes (Signed)
Daily Session Note  Patient Details  Name: [name] MRN: [id number] Date of Birth: 1955-06-01 Referring Provider:   April Manson Cancer Associated Rehabilitation & Exercise from 08/13/2020 in [facility] Cardiac and Pulmonary Rehab  Referring Provider Earlie Server MD       Encounter Date: 11/07/2020  Check In:  Session Check In - 11/07/20 1247       Check-In   Supervising physician immediately available to respond to emergencies See telemetry face sheet for immediately available ER MD    Location [facility]-Cardiac & Pulmonary Rehab    Staff Present Nada Maclachlan, BA, ACSM CEP, Exercise Physiologist;Jabree Pernice Eliezer Bottom, MS, ASCM CEP, Exercise Physiologist    Virtual Visit No    Medication changes reported     No    Fall or balance concerns reported    No    Warm-up and Cool-down Performed on first and last piece of equipment    Resistance Training Performed No   yoga   VAD Patient? No    PAD/SET Patient? No                Social History   Tobacco Use  Smoking Status Never  Smokeless Tobacco Never    Goals Met:  Exercise tolerated well Queuing for purse lip breathing No report of cardiac concerns or symptoms  Goals Unmet:  Not Applicable  Comments: Pt able to follow exercise prescription today without complaint.  Will continue to monitor for progression.   Yoga completed today by Nada Maclachlan, CEP.   Dr. Emily Filbert is Medical Director for [facility].  Dr. Ottie Glazier is Medical Director for [facility] Pulmonary Rehabilitation.

## 2020-11-11 ENCOUNTER — Encounter (HOSPITAL_COMMUNITY): Payer: Self-pay | Admitting: Oncology

## 2020-11-12 ENCOUNTER — Other Ambulatory Visit: Payer: Self-pay

## 2020-11-12 ENCOUNTER — Encounter: Payer: 59 | Attending: Family Medicine

## 2020-11-12 DIAGNOSIS — C541 Malignant neoplasm of endometrium: Secondary | ICD-10-CM | POA: Insufficient documentation

## 2020-11-12 NOTE — Progress Notes (Signed)
Daily Session Note  Patient Details  Name: Cindy Trujillo MRN: [id number] Date of Birth: 1955-07-28 Referring Provider:   April Manson Cancer Associated Rehabilitation & Exercise from 08/13/2020 in [facility] Cardiac and Pulmonary Rehab  Referring Provider Earlie Server MD       Encounter Date: 11/12/2020  Check In:  Session Check In - 11/12/20 1248       Check-In   Supervising physician immediately available to respond to emergencies See telemetry face sheet for immediately available ER MD    Location [facility]-Cardiac & Pulmonary Rehab    Staff Present Birdie Sons, MPA, RN;Melissa [place], RDN, Tawanna Solo, MS, ASCM CEP, Exercise Physiologist    Virtual Visit No    Medication changes reported     No    Fall or balance concerns reported    No    Tobacco Cessation No Change    Warm-up and Cool-down Performed on first and last piece of equipment    Resistance Training Performed Yes    VAD Patient? No    PAD/SET Patient? No      Pain Assessment   Currently in Pain? No/denies                Social History   Tobacco Use  Smoking Status Never  Smokeless Tobacco Never    Goals Met:  Independence with exercise equipment Exercise tolerated well No report of cardiac concerns or symptoms Strength training completed today  Goals Unmet:  Not Applicable  Comments: Pt able to follow exercise prescription today without complaint.  Will continue to monitor for progression.    Dr. Emily Filbert is Medical Director for [place].  Dr. Ottie Glazier is Medical Director for [facility] Pulmonary Rehabilitation.

## 2020-11-14 ENCOUNTER — Other Ambulatory Visit: Payer: Self-pay

## 2020-11-14 DIAGNOSIS — C541 Malignant neoplasm of endometrium: Secondary | ICD-10-CM

## 2020-11-14 NOTE — Progress Notes (Signed)
Daily Session Note  Patient Details  Name: Cindy Trujillo MRN: [id number] Date of Birth: 1955/12/11 Referring Provider:   April Manson Cancer Associated Rehabilitation & Exercise from 08/13/2020 in [facility] Cardiac and Pulmonary Rehab  Referring Provider Earlie Server MD       Encounter Date: 11/14/2020  Check In:  Session Check In - 11/14/20 1307       Check-In   Supervising physician immediately available to respond to emergencies See telemetry face sheet for immediately available ER MD    Location [facility]-Cardiac & Pulmonary Rehab    Staff Present Coralie Keens, MS, ASCM CEP, Exercise Physiologist;Akshaya Toepfer Oletta Darter, BA, ACSM CEP, Exercise Physiologist    Virtual Visit No    Medication changes reported     No    Fall or balance concerns reported    No    Tobacco Cessation No Change    Warm-up and Cool-down Performed on first and last piece of equipment    Resistance Training Performed Yes    VAD Patient? No    PAD/SET Patient? No      Pain Assessment   Currently in Pain? No/denies    Multiple Pain Sites No               Social History   Tobacco Use  Smoking Status Never  Smokeless Tobacco Never    Goals Met:  Proper associated with RPD/PD & O2 Sat Independence with exercise equipment Exercise tolerated well Strength training completed today  Goals Unmet:  Not Applicable  Comments: Pt able to follow exercise prescription today without complaint.  Will continue to monitor for progression.    Dr. Emily Filbert is Medical Director for [place].  Dr. Ottie Glazier is Medical Director for [facility] Pulmonary Rehabilitation.

## 2020-11-15 ENCOUNTER — Inpatient Hospital Stay: Payer: 59 | Attending: Oncology | Admitting: Oncology

## 2020-11-15 ENCOUNTER — Encounter: Payer: Self-pay | Admitting: Oncology

## 2020-11-15 VITALS — BP 111/76 | HR 93 | Temp 97.5°F | Resp 16 | Wt 220.2 lb

## 2020-11-15 DIAGNOSIS — E119 Type 2 diabetes mellitus without complications: Secondary | ICD-10-CM | POA: Diagnosis not present

## 2020-11-15 DIAGNOSIS — Z7984 Long term (current) use of oral hypoglycemic drugs: Secondary | ICD-10-CM | POA: Insufficient documentation

## 2020-11-15 DIAGNOSIS — E785 Hyperlipidemia, unspecified: Secondary | ICD-10-CM | POA: Insufficient documentation

## 2020-11-15 DIAGNOSIS — K219 Gastro-esophageal reflux disease without esophagitis: Secondary | ICD-10-CM | POA: Insufficient documentation

## 2020-11-15 DIAGNOSIS — D6851 Activated protein C resistance: Secondary | ICD-10-CM | POA: Diagnosis not present

## 2020-11-15 DIAGNOSIS — K760 Fatty (change of) liver, not elsewhere classified: Secondary | ICD-10-CM | POA: Diagnosis not present

## 2020-11-15 DIAGNOSIS — C541 Malignant neoplasm of endometrium: Secondary | ICD-10-CM | POA: Diagnosis not present

## 2020-11-15 DIAGNOSIS — D696 Thrombocytopenia, unspecified: Secondary | ICD-10-CM | POA: Insufficient documentation

## 2020-11-15 DIAGNOSIS — Z79899 Other long term (current) drug therapy: Secondary | ICD-10-CM | POA: Insufficient documentation

## 2020-11-15 DIAGNOSIS — Z86718 Personal history of other venous thrombosis and embolism: Secondary | ICD-10-CM | POA: Insufficient documentation

## 2020-11-15 DIAGNOSIS — Z8616 Personal history of COVID-19: Secondary | ICD-10-CM | POA: Insufficient documentation

## 2020-11-15 DIAGNOSIS — Z7901 Long term (current) use of anticoagulants: Secondary | ICD-10-CM | POA: Insufficient documentation

## 2020-11-15 MED ORDER — ASPIRIN EC 81 MG PO TBEC: 81.0000 mg | DELAYED_RELEASE_TABLET | Freq: Every day | ORAL | 0 refills | Status: DC

## 2020-11-15 NOTE — Progress Notes (Signed)
Hematology/Oncology progress note [facility] Telephone:(336) [phone] Fax:(336) [phone]   Patient Care Team: Virginia Crews, MD as PCP - General (Family Medicine) Clent Jacks, RN as Oncology Nurse Navigator [place], [place], MD as Referring Physician (Obstetrics) Earlie Server, MD as Consulting Physician (Oncology)  REFERRING PROVIDER: Virginia Crews, MD  CHIEF COMPLAINTS/REASON FOR VISIT:  thrombocytopenia  HISTORY OF PRESENTING ILLNESS:   Cindy Trujillo is a  65 y.o.  female with PMH listed below was seen in consultation at the request of  Brita Romp Dionne Bucy, MD  for evaluation of,  Thrombophilia work-up.    Patient was previously seen by me for thrombocytopenia.  She was last seen by me on 03/21/2019.  She lost follow-up. Patient was recently evaluated by gynecology for postmenopausal bleeding. Patient has had pelvic ultrasound and also 10/31/2019 she underwent D&C with MyoSure. Pathology showed endometrial adenocarcinoma, endometrioid type, FIGO grade 1.  Background EIN/atypical hyperplasia. Patient was seen by Dr. Theora Gianotti and recommend surgical treatment with hysterectomy and bilateral salpingoophorectomy with pelvic washing, pelvic para-aortic lymph node sampling and sentinel lymph node biopsy. Dr. Theora Gianotti recommends the patient reestablish care with me for thrombophilia work-up.  Patient is at risk of developing thrombosis perioperatively given the type of procedure, history of DVT, malignancy.   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  She has been on Eliquis 2.5 mg twice daily.  She was advised by primary care provider to cut 5 mg tablets to half for 2.5 mg twice daily.   Managed by primary care provider.  DVT was found incidentally to her procedure for varicose vein.  For her chronic thrombocytopenia, her initial work-up was nonremarkable except borderline vitamin B12 level.  She was recommended  to observe blood counts and if further worsening of thrombocytopenia, I recommend bone marrow biopsy.  She lost follow-up since December 2020. Ultrasound liver showed coarse hepatic echo Texture suggesting fatty liver disease.  12/06/2019 underwent  hysterectomy with oophorectomy.  Pathology showed endometrioid endometiral adenocarcinoma, low grade, Figo 1, invasive 80m/16mm of total myometrial thickness. No lymph node involvement. pT1a pN0.   # COVID-19 infection in February 2022.   INTERVAL HISTORY Cindy SLAUGHis a 65y.o. female who has above history reviewed by me today presents for follow up visit for history of DVT, factor V Leiden heterozygous mutation, thrombocytopenia and endometrial cancer Patient had a bone marrow biopsy for work-up of thrombocytopenia.  She presents to discuss results.  No new complaints.  Some easy bruising     Review of Systems  Constitutional:  Negative for appetite change, chills, fatigue and fever.  HENT:   Negative for hearing loss and voice change.   Eyes:  Negative for eye problems.  Respiratory:  Negative for chest tightness and cough.   Cardiovascular:  Negative for chest pain.  Gastrointestinal:  Negative for abdominal distention, abdominal pain and blood in stool.  Endocrine: Negative for hot flashes.  Genitourinary:  Negative for difficulty urinating and frequency.   Musculoskeletal:  Negative for arthralgias.  Skin:  Negative for itching and rash.  Neurological:  Negative for extremity weakness.  Hematological:  Negative for adenopathy. Bruises/bleeds easily.  Psychiatric/Behavioral:  Negative for confusion.    MEDICAL HISTORY:  Past Medical History:  Diagnosis Date   Anemia    [place]    [place]   right leg   GERD (gastroesophageal reflux disease)  Hyperlipidemia     SURGICAL HISTORY: Past Surgical History:  Procedure Laterality Date   COLONOSCOPY WITH  PROPOFOL N/A 08/23/2017   Procedure: COLONOSCOPY WITH PROPOFOL;  Surgeon: Manya Silvas, MD;  Location: [facility] ENDOSCOPY;  Service: Endoscopy;  Laterality: N/A;   ENDOVENOUS ABLATION SAPHENOUS VEIN W/ LASER Left 02/29/2020   endovenous laser ablation left greater saphenous vein and stab phlebectomy 10-20 incisions left leg by Gae Gallop MD    HYSTEROSCOPY WITH D & C N/A 10/31/2019   Procedure: DILATATION AND CURETTAGE /HYSTEROSCOPY WITH MYOSURE RESECTION;  Surgeon: Homero Fellers, MD;  Location: [facility] ORS;  Service: Gynecology;  Laterality: N/A;   ROBOTIC ASSISTED TOTAL HYSTERECTOMY WITH BILATERAL SALPINGO OOPHERECTOMY N/A 12/06/2019   Procedure: XI ROBOTIC ASSISTED TOTAL HYSTERECTOMY WITH BILATERAL SALPINGO OOPHORECTOMY;  Surgeon: Gillis Ends, MD;  Location: [facility] ORS;  Service: Gynecology;  Laterality: N/A;   SENTINEL NODE BIOPSY N/A 12/06/2019   Procedure: SENTINEL NODE BIOPSY;  Surgeon: Gillis Ends, MD;  Location: [facility] ORS;  Service: Gynecology;  Laterality: N/A;   TONSILLECTOMY     TONSILLECTOMY AND ADENOIDECTOMY  1968   VEIN SURGERY  05/2019   laser    SOCIAL HISTORY: Social History   Socioeconomic History   Marital status: Married    Spouse name: Roger   Number of children: 0   Years of education: some college   Highest education level: Not on file  Occupational History   Occupation: CARE MANGEMENT    Employer: [employer]  Tobacco Use   Smoking status: Never   Smokeless tobacco: Never  Vaping Use   Vaping Use: Never used  Substance and Sexual Activity   Alcohol use: No    Alcohol/week: 0.0 standard drinks   Drug use: No   Sexual activity: Not Currently    Birth control/protection: Surgical  Other Topics Concern   Not on file  Social History Narrative   Not on file   Social Determinants of Health   Financial Resource Strain: Not on file  Food Insecurity: Not on file  Transportation Needs: Not on file  Physical  Activity: Not on file  Stress: Not on file  Social Connections: Not on file  Intimate Partner Violence: Not on file    FAMILY HISTORY: Family History  Problem Relation Age of Onset   COPD Mother        heavy smoker   Pulmonary fibrosis Mother    Fibromyalgia Mother    COPD Father    Melanoma Father    COPD Sister    COPD Brother    COPD Sister    COPD Sister    Healthy Brother    Heart failure Maternal Grandmother    Heart attack Maternal Grandfather    Cancer Paternal Grandmother        unsure of type, had mets in colon   Heart attack Paternal Grandfather    Cancer Paternal Grandfather        unknown   Breast cancer Neg Hx    Cervical cancer Neg Hx     ALLERGIES:  is allergic to sulfa antibiotics.  MEDICATIONS:  Current Outpatient Medications  Medication Sig Dispense Refill   acetaminophen (TYLENOL) 500 MG tablet Take 500 mg by mouth every 6 (six) hours as needed (for pain.).      Blood Glucose Monitoring Suppl (FREESTYLE LITE) DEVI      dapagliflozin propanediol (FARXIGA) 10 MG TABS tablet Take 1 tablet (10 mg total) by mouth daily. 90 tablet 3  ferrous sulfate 325 (65 FE) MG tablet TAKE 1 TABLET BY MOUTH TWICE DAILY 60 tablet 2   FREESTYLE LITE test strip USE ONE STRIP EVERY MORNING, AT NOON, IN THE EVENING, AND AT BEDTIME. (Patient taking differently: USE ONE STRIP EVERY MORNING, AT NOON, IN THE EVENING, AND AT BEDTIME.) 100 strip 3   FreeStyle Unistick II Lancets MISC Use as instructed to check blood glucose daily 100 each 5   glucose blood (FREESTYLE TEST STRIPS) test strip Use as instructed to check blood glucose daily 100 each 12   glucose monitoring kit (FREESTYLE) monitoring kit 1 each by Does not apply route as needed for other. 1 each 0   Lancets (FREESTYLE) lancets      lisinopril (ZESTRIL) 5 MG tablet TAKE 1 TABLET (5 MG TOTAL) BY MOUTH DAILY. 90 tablet 3   loratadine (CLARITIN) 10 MG tablet Take 1 tablet (10 mg total) by mouth daily. 90 tablet 3    Magnesium 400 MG TABS Take 1 tablet by mouth daily.     metFORMIN (GLUCOPHAGE) 1000 MG tablet TAKE 1 TABLET BY MOUTH TWICE DAILY WITH A MEAL. 180 tablet 3   Multiple Vitamin (MULTIVITAMIN WITH MINERALS) TABS tablet Take 1 tablet by mouth 2 (two) times a week.     omeprazole (PRILOSEC) 20 MG capsule Take 1 capsule (20 mg total) by mouth daily. 90 capsule 3   rosuvastatin (CRESTOR) 5 MG tablet Take 1 tablet (5 mg total) by mouth daily. 90 tablet 3   vitamin B-12 (CYANOCOBALAMIN) 1000 MCG tablet Take 1 tablet (1,000 mcg total) by mouth daily. (Patient taking differently: Take 1,000 mcg by mouth every evening.) 90 tablet 1   apixaban (ELIQUIS) 2.5 MG TABS tablet TAKE 1 TABLET BY MOUTH TWICE DAILY (Patient not taking: No sig reported) 180 tablet 1   No current facility-administered medications for this visit.     PHYSICAL EXAMINATION: ECOG PERFORMANCE STATUS: 0 - Asymptomatic Vitals:   11/15/20 1202  BP: 111/76  Pulse: 93  Resp: 16  Temp: (!) 97.5 F (36.4 C)   Filed Weights   11/15/20 1202  Weight: 220 lb 3.2 oz (99.9 kg)    Physical Exam Constitutional:      General: She is not in acute distress. HENT:     Head: Normocephalic and atraumatic.  Eyes:     General: No scleral icterus.    Pupils: Pupils are equal, round, and reactive to light.  Cardiovascular:     Rate and Rhythm: Normal rate and regular rhythm.     Heart sounds: Normal heart sounds.  Pulmonary:     Effort: Pulmonary effort is normal. No respiratory distress.     Breath sounds: No wheezing.  Abdominal:     General: Bowel sounds are normal. There is no distension.     Palpations: Abdomen is soft. There is no mass.     Tenderness: There is no abdominal tenderness.  Musculoskeletal:        General: No deformity. Normal range of motion.     Cervical back: Normal range of motion and neck supple.  Skin:    General: Skin is warm and dry.  Neurological:     Mental Status: She is alert and oriented to person, place,  and time. Mental status is at baseline.     Cranial Nerves: No cranial nerve deficit.     Coordination: Coordination normal.  Psychiatric:        Mood and Affect: Mood normal.     LABORATORY DATA:  I have reviewed the data as listed Lab Results  Component Value Date   WBC 4.1 11/01/2020   HGB 12.2 11/01/2020   HCT 36.5 11/01/2020   MCV 84.5 11/01/2020   PLT 85 (L) 11/01/2020   Recent Labs    11/23/19 0806 12/06/19 1802 01/24/20 1424 07/01/20 1246 10/02/20 1240 10/04/20 1306  NA 139 137 140 137 137 136  K 4.2 4.5 4.4 4.1 4.4 4.1  CL 109 107 109 106 105 102  CO2 20* 21* _0 GLUCOSE 186* 170* 125* 195* 157* 134*  BUN _1 CREATININE 1.12* 1.19* 1.16* 1.21* 1.11* 1.24*  CALCIUM 9.2 8.4* 9.0 8.9 9.5 9.9  GFRNONAA 53* 49* 50* 51* 56*  --   GFRAA >60 57*  --   --   --   --   PROT 7.3 6.3* 7.6 7.6 8.0 7.5  ALBUMIN 3.9 3.5 4.0 3.8 4.2 4.4  AST 42* 44* 52* 44* 49* 49*  ALT _2 ALKPHOS 88 68 81 90 103 115  BILITOT 1.2 1.5* 0.8 1.1 0.9 1.1    Iron/TIBC/Ferritin/ %Sat    Component Value Date/Time   IRON 42 10/02/2020 1240   TIBC 434 10/02/2020 1240   FERRITIN 25 10/02/2020 1240   IRONPCTSAT 10 (L) 10/02/2020 1240      RADIOGRAPHIC STUDIES: I have personally reviewed the radiological images as listed and agreed with the findings in the report. CT BONE MARROW BIOPSY & ASPIRATION  Result Date: 11/01/2020 INDICATION: Thrombocytopenia EXAM: CT GUIDED BONE MARROW ASPIRATES AND BIOPSY MEDICATIONS: None. ANESTHESIA/SEDATION: Fentanyl 75 mcg IV; Versed 1.5 mg IV Moderate Sedation Time:  11 minutes The patient was continuously monitored during the procedure by the interventional radiology nurse under my direct supervision. COMPLICATIONS: None immediate. PROCEDURE: The procedure was explained to the patient. The risks and benefits of the procedure were discussed and the patient's questions were addressed. Informed consent was obtained from the  patient. The patient was placed prone on CT table. Images of the pelvis were obtained. The right side of back was prepped and draped in sterile fashion. The skin and right posterior ilium were anesthetized with 1% lidocaine. 11 gauge bone needle was directed into the right ilium with CT guidance. Two aspirates and 1 core biopsy were obtained. Bandage placed over the puncture site. IMPRESSION: CT guided bone marrow aspiration and core biopsy. Electronically Signed   By: Miachel Roux M.D.   On: 11/01/2020 14:45      ASSESSMENT & PLAN:  1. Thrombocytopenia ([place])   2. Fatty liver disease, nonalcoholic   3. History of DVT (deep vein thrombosis)   4. Heterozygous factor V Leiden mutation [facility])   Cancer Staging Endometrial cancer [facility]) Staging form: Corpus Uteri - Carcinoma and Carcinosarcoma, AJCC 8th Edition - Pathologic stage from 12/25/2019: Stage IA (pT1a, pN0, cM0) - Signed by Earlie Server, MD on 12/25/2019   #Endometrioid endometrial cancer, stage IA, Loss of MLH1 No LVI, continue follow-up with with GYN oncology alternating with GYN for pelvic examination every 6 months.  # History of DVT, and heterozygous factor V leiden mutation.  Off Eliquis due to easy bruising.  Recommend patient to take OTC aspirin 81 mg for prophylaxis.   #Chronic thrombocytopenia, previously work up was not remarkable. Bone marrow biopsy results were reviewed and discussed with patient.  She has normocellular marrow with trilineage hematopoiesis.  Minimal erythroid atypia and megakaryocyte clustering.  A low-grade myelodysplastic syndrome  with minimal cytologic atypia may be considered. Cytogenetics is normal. Will ask pathology to add MDS FISH Observation.  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   History of abnormal ultrasound of the abdomen which shows coarse hepatic echotexture.  Possible fatty liver disease.  Recommend her to establish gastroenterology. I will repeat abdominal ultrasound for follow-up.  Orders  Placed This Encounter  Procedures   US Abdomen Complete    Standing Status:   Future    Standing Expiration Date:   11/15/2021    Order Specific Question:   Reason for exam:    Answer:   fatty liver disease, thrombocytopenia    Order Specific Question:   Preferred imaging location?    Answer:   [facility]   CBC with Differential/Platelet    Standing Status:   Future    Standing Expiration Date:   11/15/2021   Comprehensive metabolic panel    Standing Status:   Future    Standing Expiration Date:   11/15/2021   Immature Platelet Fraction    Standing Status:   Future    Standing Expiration Date:   11/15/2021    All questions were answered. The patient knows to call the clinic with any problems questions or concerns. cc Brita Romp Dionne Bucy, MD    Return of visit: 6 months  Earlie Server, MD, PhD Hematology Oncology [facility] Pager- [phone] 11/15/2020

## 2020-11-15 NOTE — Progress Notes (Signed)
Patient denies new problems/concerns today.   °

## 2020-11-19 ENCOUNTER — Telehealth: Payer: Self-pay

## 2020-11-19 ENCOUNTER — Other Ambulatory Visit: Payer: Self-pay

## 2020-11-19 DIAGNOSIS — C541 Malignant neoplasm of endometrium: Secondary | ICD-10-CM

## 2020-11-19 NOTE — Telephone Encounter (Signed)
-----  Message from Earlie Server, MD sent at 11/15/2020  5:49 PM EDT ----- Please ask [place] pathology Dr.Dawn Butler's office to add MDS FISH to her recent bone marrow biopsy specimen.thanks. [facility] pathology [phone]

## 2020-11-19 NOTE — Telephone Encounter (Signed)
Contacted [place] pathology and spoke to [place], who took verbal order to add MDS FISH to specimen [id number] collected on 11/01/20.

## 2020-11-19 NOTE — Progress Notes (Signed)
Daily Session Note  Patient Details  Name: Cindy Trujillo MRN: [id number] Date of Birth: 10/06/1955 Referring Provider:   April Manson Cancer Associated Rehabilitation & Exercise from 08/13/2020 in [facility] Cardiac and Pulmonary Rehab  Referring Provider Earlie Server MD       Encounter Date: 11/19/2020  Check In:  Session Check In - 11/19/20 1225       Check-In   Supervising physician immediately available to respond to emergencies See telemetry face sheet for immediately available ER MD    Location [facility]-Cardiac & Pulmonary Rehab    Staff Present Birdie Sons, MPA, RN;Jessica [place], MA, RCEP, CCRP, Marylynn Pearson, MS, ASCM CEP, Exercise Physiologist    Virtual Visit No    Medication changes reported     No    Fall or balance concerns reported    No    Tobacco Cessation No Change    Warm-up and Cool-down Performed on first and last piece of equipment    Resistance Training Performed Yes    VAD Patient? No    PAD/SET Patient? No      Pain Assessment   Currently in Pain? No/denies                Social History   Tobacco Use  Smoking Status Never  Smokeless Tobacco Never    Goals Met:  Proper associated with RPD/PD & O2 Sat Independence with exercise equipment Exercise tolerated well No report of cardiac concerns or symptoms Strength training completed today  Goals Unmet:  Not Applicable  Comments: Pt able to follow exercise prescription today without complaint.  Will continue to monitor for progression.    Dr. Emily Filbert is Medical Director for [place].  Dr. Ottie Glazier is Medical Director for [facility] Pulmonary Rehabilitation.

## 2020-11-21 ENCOUNTER — Other Ambulatory Visit: Payer: Self-pay

## 2020-11-21 DIAGNOSIS — C541 Malignant neoplasm of endometrium: Secondary | ICD-10-CM

## 2020-11-21 NOTE — Progress Notes (Signed)
Daily Session Note  Patient Details  Name: Cindy Trujillo MRN: [id number] Date of Birth: 1955-12-10 Referring Provider:   April Manson Cancer Associated Rehabilitation & Exercise from 08/13/2020 in [facility] Cardiac and Pulmonary Rehab  Referring Provider Earlie Server MD       Encounter Date: 11/21/2020  Check In:  Session Check In - 11/21/20 1340       Check-In   Supervising physician immediately available to respond to emergencies See telemetry face sheet for immediately available ER MD    Staff Present Nada Maclachlan, BA, ACSM CEP, Exercise Physiologist;Yitzhak Awan Eliezer Bottom, MS, ASCM CEP, Exercise Physiologist    Virtual Visit No    Medication changes reported     No    Fall or balance concerns reported    No    Warm-up and Cool-down Performed on first and last piece of equipment    Resistance Training Performed Yes    VAD Patient? No                Social History   Tobacco Use  Smoking Status Never  Smokeless Tobacco Never    Goals Met:  Independence with exercise equipment Exercise tolerated well No report of cardiac concerns or symptoms Strength training completed today  Goals Unmet:  Not Applicable  Comments: Pt able to follow exercise prescription today without complaint.  Will continue to monitor for progression.    Dr. Emily Filbert is Medical Director for [place].  Dr. Ottie Glazier is Medical Director for [facility] Pulmonary Rehabilitation.

## 2020-11-26 ENCOUNTER — Other Ambulatory Visit: Payer: Self-pay

## 2020-11-26 ENCOUNTER — Encounter (HOSPITAL_COMMUNITY): Payer: Self-pay | Admitting: Oncology

## 2020-11-26 DIAGNOSIS — C541 Malignant neoplasm of endometrium: Secondary | ICD-10-CM

## 2020-11-26 NOTE — Progress Notes (Signed)
Daily Session Note  Patient Details  Name: Cindy Trujillo MRN: [id number] Date of Birth: [birth year] Referring Provider:   April Manson Cancer Associated Rehabilitation & Exercise from 08/13/2020 in [facility] Cardiac and Pulmonary Rehab  Referring Provider Earlie Server MD       Encounter Date: 11/26/2020  Check In:  Session Check In - 11/26/20 1236       Check-In   Supervising physician immediately available to respond to emergencies See telemetry face sheet for immediately available ER MD    Location [facility]-Cardiac & Pulmonary Rehab    Staff Present Birdie Sons, MPA, RN;Melissa Cobden, RDN, Tawanna Solo, MS, ASCM CEP, Exercise Physiologist    Virtual Visit No    Medication changes reported     No    Fall or balance concerns reported    No    Tobacco Cessation No Change    Warm-up and Cool-down Performed on first and last piece of equipment    Resistance Training Performed Yes    VAD Patient? No    PAD/SET Patient? No      Pain Assessment   Currently in Pain? No/denies                Social History   Tobacco Use  Smoking Status Never  Smokeless Tobacco Never    Goals Met:  Independence with exercise equipment Exercise tolerated well No report of cardiac concerns or symptoms Strength training completed today  Goals Unmet:  Not Applicable  Comments: Pt able to follow exercise prescription today without complaint.  Will continue to monitor for progression.    Dr. Emily Filbert is Medical Director for [place].  Dr. Ottie Glazier is Medical Director for [facility] Pulmonary Rehabilitation.

## 2020-11-28 ENCOUNTER — Other Ambulatory Visit: Payer: Self-pay

## 2020-11-28 DIAGNOSIS — C541 Malignant neoplasm of endometrium: Secondary | ICD-10-CM

## 2020-11-28 LAB — SURGICAL PATHOLOGY

## 2020-11-28 NOTE — Progress Notes (Signed)
Daily Session Note  Patient Details  Name: Cindy Trujillo MRN: [id number] Date of Birth: 1956-03-04 Referring Provider:   April Manson Cancer Associated Rehabilitation & Exercise from 08/13/2020 in [facility] Cardiac and Pulmonary Rehab  Referring Provider Earlie Server MD       Encounter Date: 11/28/2020  Check In:  Session Check In - 11/28/20 1224       Check-In   Supervising physician immediately available to respond to emergencies See telemetry face sheet for immediately available ER MD    Location [facility]-Cardiac & Pulmonary Rehab    Staff Present Birdie Sons, MPA, Nino Glow, MS, ASCM CEP, Exercise Physiologist;Amanda Oletta Darter, BA, ACSM CEP, Exercise Physiologist    Virtual Visit No    Medication changes reported     No    Fall or balance concerns reported    No    Tobacco Cessation No Change    Warm-up and Cool-down Performed on first and last piece of equipment    Resistance Training Performed Yes    VAD Patient? No    PAD/SET Patient? No      Pain Assessment   Currently in Pain? No/denies                Social History   Tobacco Use  Smoking Status Never  Smokeless Tobacco Never    Goals Met:  Independence with exercise equipment Exercise tolerated well No report of cardiac concerns or symptoms Strength training completed today  Goals Unmet:  Not Applicable  Comments: Pt able to follow exercise prescription today without complaint.  Will continue to monitor for progression.    Dr. Emily Filbert is Medical Director for [place].  Dr. Ottie Glazier is Medical Director for [facility] Pulmonary Rehabilitation.

## 2020-12-03 ENCOUNTER — Ambulatory Visit: Payer: 59

## 2020-12-03 ENCOUNTER — Other Ambulatory Visit: Payer: Self-pay

## 2020-12-03 DIAGNOSIS — C541 Malignant neoplasm of endometrium: Secondary | ICD-10-CM

## 2020-12-03 NOTE — Progress Notes (Signed)
Daily Session Note  Patient Details  Name: PAISLEE SZATKOWSKI MRN: [id number] Date of Birth: December 06, 1955 Referring Provider:   April Manson Cancer Associated Rehabilitation & Exercise from 08/13/2020 in [facility] Cardiac and Pulmonary Rehab  Referring Provider Earlie Server MD       Encounter Date: 12/03/2020  Check In:  Session Check In - 12/03/20 1218       Check-In   Supervising physician immediately available to respond to emergencies See telemetry face sheet for immediately available ER MD    Location [facility]-Cardiac & Pulmonary Rehab    Staff Present Birdie Sons, MPA, Nino Glow, MS, ASCM CEP, Exercise Physiologist;Jessica Luan Pulling, MA, RCEP, CCRP, CCET    Virtual Visit No    Medication changes reported     No    Fall or balance concerns reported    No    Tobacco Cessation No Change    Warm-up and Cool-down Performed on first and last piece of equipment    Resistance Training Performed Yes    VAD Patient? No    PAD/SET Patient? No      Pain Assessment   Currently in Pain? No/denies                Social History   Tobacco Use  Smoking Status Never  Smokeless Tobacco Never    Goals Met:  Independence with exercise equipment Exercise tolerated well No report of cardiac concerns or symptoms Strength training completed today  Goals Unmet:  Not Applicable  Comments: Pt able to follow exercise prescription today without complaint.  Will continue to monitor for progression.    Dr. Emily Filbert is Medical Director for [place].  Dr. Ottie Glazier is Medical Director for [facility] Pulmonary Rehabilitation.

## 2020-12-05 ENCOUNTER — Other Ambulatory Visit: Payer: Self-pay

## 2020-12-05 ENCOUNTER — Ambulatory Visit: Payer: 59 | Admitting: Dermatology

## 2020-12-05 VITALS — Ht 64.25 in | Wt 218.5 lb

## 2020-12-05 DIAGNOSIS — C541 Malignant neoplasm of endometrium: Secondary | ICD-10-CM

## 2020-12-05 NOTE — Patient Instructions (Signed)
Discharge Patient Instructions  Patient Details  Name: Cindy Trujillo MRN: [id number] Date of Birth: 05-Jan-1956 Referring Provider:  Virginia Crews, MD   Number of Visits: 16  Reason for Discharge:  Patient reached a stable level of exercise. Patient independent in their exercise. Patient has met program and personal goals.   Diagnosis:  Endometrial cancer [facility])  Initial Exercise Prescription:  Initial Exercise Prescription - 08/13/20 1400       Date of Initial Exercise RX and Referring Provider   Date 08/13/20    Referring Provider Earlie Server MD      Treadmill   MPH 2.3    Grade 0.5    Minutes 15    METs 2.92      Recumbant Bike   Level 2    RPM 60    Watts 15    Minutes 15    METs 2.4      NuStep   Level 2    SPM 80    Minutes 15    METs 2.4      REL-XR   Level 2    Watts 50    Minutes 15    METs 2.4      Prescription Details   Frequency (times per week) 2    Duration Progress to 30 minutes of continuous aerobic without signs/symptoms of physical distress      Intensity   THRR 40-80% of Max Heartrate 113-143    Ratings of Perceived Exertion 11-13    Perceived Dyspnea 0-4      Resistance Training   Training Prescription Yes    Weight 2 lb    Reps 10-15             Discharge Exercise Prescription (Final Exercise Prescription Changes):  Exercise Prescription Changes - 12/05/20 1300       Response to Exercise   Blood Pressure (Admit) 96/60    Blood Pressure (Exercise) 126/62    Blood Pressure (Exit) 110/62    Heart Rate (Admit) 89 bpm    Heart Rate (Exercise) 114 bpm    Heart Rate (Exit) 88 bpm    Oxygen Saturation (Admit) 96 %    Oxygen Saturation (Exercise) 93 %    Oxygen Saturation (Exit) 98 %    Rating of Perceived Exertion (Exercise) 9    Perceived Dyspnea (Exercise) 0    Symptoms none    Duration Continue with 30 min of aerobic exercise without signs/symptoms of physical distress.    Intensity THRR unchanged       Progression   Progression Continue to progress workloads to maintain intensity without signs/symptoms of physical distress.      Resistance Training   Training Prescription Yes    Weight 2 lb    Reps 10-15      Treadmill   MPH 2.8    Grade 2    Minutes 15    METs 3.91      REL-XR   Level 5    Minutes 15    METs 4.7             Functional Capacity:  6 Minute Walk     Row Name 08/13/20 1358 12/05/20 1333       6 Minute Walk   Phase Initial Discharge    Distance 1162 feet 1365 feet    Distance % Change -- 17.46 %    Distance Feet Change -- 203 ft    Walk Time 6 minutes 6 minutes    #  of Rest Breaks 0 0    MPH 2.2 2.58    METS 2.43 2.97    RPE 11 9    Perceived Dyspnea  0 0    VO2 Peak 8.52 10.4    Symptoms No No    Resting HR 84 bpm 89 bpm    Resting BP 106/64 96/60    Resting Oxygen Saturation  97 % 96 %    Exercise Oxygen Saturation  during 6 min walk 95 % 93 %    Max Ex. HR 107 bpm 114 bpm    Max Ex. BP 114/62 126/62    2 Minute Post BP 108/64 110/62             Nutrition & Weight - Outcomes:   Post Biometrics - 12/05/20 1335        Post  Biometrics   Height 5' 4.25" (1.632 m)    Weight 218 lb 8 oz (99.1 kg)    BMI (Calculated) 37.21              Goals reviewed with patient; copy given to patient.

## 2020-12-05 NOTE — Progress Notes (Signed)
CARE Daily Session Note  Patient Details  Name: Cindy Trujillo MRN: [id number] Date of Birth: [birth year] Referring Provider:   April Manson Cancer Associated Rehabilitation & Exercise from 08/13/2020 in [facility] Cardiac and Pulmonary Rehab  Referring Provider Earlie Server MD       Encounter Date: 12/05/2020  Check In:  Session Check In - 12/05/20 1230       Check-In   Supervising physician immediately available to respond to emergencies See telemetry face sheet for immediately available ER MD    Location [facility]-Cardiac & Pulmonary Rehab    Staff Present Nada Maclachlan, BA, ACSM CEP, Exercise Physiologist;Haeven Nickle Eliezer Bottom, MS, ASCM CEP, Exercise Physiologist    Virtual Visit No    Medication changes reported     No    Fall or balance concerns reported    No    Warm-up and Cool-down Performed on first and last piece of equipment    Resistance Training Performed No   yoga   VAD Patient? No    PAD/SET Patient? No              6 Minute Walk     Row Name 08/13/20 1358 12/05/20 1333       6 Minute Walk   Phase Initial Discharge    Distance 1162 feet 1365 feet    Distance % Change -- 17.46 %    Distance Feet Change -- 203 ft    Walk Time 6 minutes 6 minutes    # of Rest Breaks 0 0    MPH 2.2 2.58    METS 2.43 2.97    RPE 11 9    Perceived Dyspnea  0 0    VO2 Peak 8.52 10.4    Symptoms No No    Resting HR 84 bpm 89 bpm    Resting BP 106/64 96/60    Resting Oxygen Saturation  97 % 96 %    Exercise Oxygen Saturation  during 6 min walk 95 % 93 %    Max Ex. HR 107 bpm 114 bpm    Max Ex. BP 114/62 126/62    2 Minute Post BP 108/64 110/62               Social History   Tobacco Use  Smoking Status Never  Smokeless Tobacco Never    Goals Met:  Proper associated with RPD/PD & O2 Sat Exercise tolerated well No report of concerns or symptoms today  Goals Unmet:  Not Applicable  Comments: Pt able to follow exercise prescription today without complaint.  Will continue to  monitor for progression.   Yoga completed today by Exercise Physiologist, Nada Maclachlan, CEP.   Dr. Emily Filbert is Medical Director for [place].  Dr. Ottie Glazier is Medical Director for [facility] Pulmonary Rehabilitation.

## 2020-12-06 ENCOUNTER — Ambulatory Visit: Payer: 59

## 2020-12-09 ENCOUNTER — Other Ambulatory Visit: Payer: Self-pay

## 2020-12-10 ENCOUNTER — Other Ambulatory Visit: Payer: Self-pay

## 2020-12-10 DIAGNOSIS — C541 Malignant neoplasm of endometrium: Secondary | ICD-10-CM

## 2020-12-10 NOTE — Progress Notes (Signed)
Daily Session Note  Patient Details  Name: MICKIE BADDERS MRN: [id number] Date of Birth: 09/24/1955 Referring Provider:   April Manson Cancer Associated Rehabilitation & Exercise from 08/13/2020 in [facility] Cardiac and Pulmonary Rehab  Referring Provider Earlie Server MD       Encounter Date: 12/10/2020  Check In:  Session Check In - 12/10/20 1228       Check-In   Supervising physician immediately available to respond to emergencies See telemetry face sheet for immediately available ER MD    Location [facility]-Cardiac & Pulmonary Rehab    Staff Present Birdie Sons, MPA, RN;Jessica [place], MA, RCEP, CCRP, Marylynn Pearson, MS, ASCM CEP, Exercise Physiologist    Virtual Visit No    Medication changes reported     No    Fall or balance concerns reported    No    Tobacco Cessation No Change    Warm-up and Cool-down Performed on first and last piece of equipment    Resistance Training Performed Yes    VAD Patient? No    PAD/SET Patient? No      Pain Assessment   Currently in Pain? No/denies                Social History   Tobacco Use  Smoking Status Never  Smokeless Tobacco Never    Goals Met:  Independence with exercise equipment Exercise tolerated well No report of concerns or symptoms today Strength training completed today  Goals Unmet:  Not Applicable  Comments: Pt able to follow exercise prescription today without complaint.  Will continue to monitor for progression.    Dr. Emily Filbert is Medical Director for [place].  Dr. Ottie Glazier is Medical Director for [facility] Pulmonary Rehabilitation.

## 2020-12-12 ENCOUNTER — Other Ambulatory Visit: Payer: Self-pay

## 2020-12-12 ENCOUNTER — Encounter: Payer: 59 | Attending: Family Medicine

## 2020-12-12 DIAGNOSIS — C541 Malignant neoplasm of endometrium: Secondary | ICD-10-CM | POA: Insufficient documentation

## 2020-12-12 NOTE — Progress Notes (Signed)
Daily Session Note  Patient Details  Name: Cindy Trujillo MRN: [id number] Date of Birth: Jan 09, 1956 Referring Provider:   April Manson Cancer Associated Rehabilitation & Exercise from 08/13/2020 in [facility] Cardiac and Pulmonary Rehab  Referring Provider Earlie Server MD       Encounter Date: 12/12/2020  Check In:  Session Check In - 12/12/20 1227       Check-In   Supervising physician immediately available to respond to emergencies See telemetry face sheet for immediately available ER MD    Location [facility]-Cardiac & Pulmonary Rehab    Staff Present Birdie Sons, MPA, RN;Melissa Johnston, RDN, Rowe Pavy, BA, ACSM CEP, Exercise Physiologist    Virtual Visit No    Medication changes reported     No    Fall or balance concerns reported    No    Tobacco Cessation No Change    Warm-up and Cool-down Performed on first and last piece of equipment    Resistance Training Performed Yes    VAD Patient? No    PAD/SET Patient? No      Pain Assessment   Currently in Pain? No/denies                Social History   Tobacco Use  Smoking Status Never  Smokeless Tobacco Never    Goals Met:  Independence with exercise equipment Exercise tolerated well No report of concerns or symptoms today Strength training completed today  Goals Unmet:  Not Applicable  Comments: Pt able to follow exercise prescription today without complaint.  Will continue to monitor for progression.    Dr. Emily Filbert is Medical Director for [place].  Dr. Ottie Glazier is Medical Director for [facility] Pulmonary Rehabilitation.

## 2020-12-18 ENCOUNTER — Other Ambulatory Visit: Payer: Self-pay

## 2020-12-19 ENCOUNTER — Other Ambulatory Visit: Payer: Self-pay

## 2020-12-19 DIAGNOSIS — C541 Malignant neoplasm of endometrium: Secondary | ICD-10-CM

## 2020-12-19 NOTE — Progress Notes (Signed)
CARE Daily Session Note  Patient Details  Name: Cindy Trujillo MRN: [id number] Date of Birth: 10/29/1955 Referring Provider:   April Manson Cancer Associated Rehabilitation & Exercise from 08/13/2020 in [facility] Cardiac and Pulmonary Rehab  Referring Provider Earlie Server MD       Encounter Date: 12/19/2020  Check In:  Session Check In - 12/19/20 1249       Check-In   Supervising physician immediately available to respond to emergencies See telemetry face sheet for immediately available ER MD    Location [facility]-Cardiac & Pulmonary Rehab    Staff Present Nada Maclachlan, BA, ACSM CEP, Exercise Physiologist;Darcey Cardy Eliezer Bottom, MS, ASCM CEP, Exercise Physiologist    Virtual Visit No    Medication changes reported     No    Fall or balance concerns reported    No    Warm-up and Cool-down Performed on first and last piece of equipment    Resistance Training Performed Yes    VAD Patient? No    PAD/SET Patient? No                Social History   Tobacco Use  Smoking Status Never  Smokeless Tobacco Never    Goals Met:  Independence with exercise equipment Exercise tolerated well No report of concerns or symptoms today Strength training completed today  Goals Unmet:  Not Applicable  Comments: Pt able to follow exercise prescription today without complaint.  Will continue to monitor for progression.    Dr. Emily Filbert is Medical Director for [place].  Dr. Ottie Glazier is Medical Director for [facility] Pulmonary Rehabilitation.

## 2020-12-24 ENCOUNTER — Other Ambulatory Visit: Payer: Self-pay

## 2020-12-24 DIAGNOSIS — C541 Malignant neoplasm of endometrium: Secondary | ICD-10-CM

## 2020-12-24 NOTE — Progress Notes (Signed)
Daily Session Note  Patient Details  Name: Cindy Trujillo MRN: [id number] Date of Birth: [birth year] Referring Provider:   April Manson Cancer Associated Rehabilitation & Exercise from 08/13/2020 in [facility] Cardiac and Pulmonary Rehab  Referring Provider Earlie Server MD       Encounter Date: 12/24/2020  Check In:  Session Check In - 12/24/20 1243       Check-In   Supervising physician immediately available to respond to emergencies See telemetry face sheet for immediately available ER MD    Location [facility]-Cardiac & Pulmonary Rehab    Staff Present Birdie Sons, MPA, RN;Jessica [place], MA, RCEP, CCRP, Marylynn Pearson, MS, ASCM CEP, Exercise Physiologist    Virtual Visit No    Medication changes reported     No    Fall or balance concerns reported    No    Tobacco Cessation No Change    Warm-up and Cool-down Performed on first and last piece of equipment    Resistance Training Performed Yes    VAD Patient? No    PAD/SET Patient? No      Pain Assessment   Currently in Pain? No/denies                Social History   Tobacco Use  Smoking Status Never  Smokeless Tobacco Never    Goals Met:  Independence with exercise equipment Exercise tolerated well No report of concerns or symptoms today Strength training completed today  Goals Unmet:  Not Applicable  Comments: Pt able to follow exercise prescription today without complaint.  Will continue to monitor for progression.    Dr. Emily Filbert is Medical Director for [place].  Dr. Ottie Glazier is Medical Director for [facility] Pulmonary Rehabilitation.

## 2020-12-26 ENCOUNTER — Other Ambulatory Visit: Payer: Self-pay

## 2020-12-26 DIAGNOSIS — C541 Malignant neoplasm of endometrium: Secondary | ICD-10-CM

## 2020-12-26 NOTE — Progress Notes (Signed)
Daily Session Note  Patient Details  Name: Cindy Trujillo MRN: [id number] Date of Birth: 11-05-1955 Referring Provider:   April Manson Cancer Associated Rehabilitation & Exercise from 08/13/2020 in [facility] Cardiac and Pulmonary Rehab  Referring Provider Earlie Server MD       Encounter Date: 12/26/2020  Check In:  Session Check In - 12/26/20 1340       Check-In   Supervising physician immediately available to respond to emergencies See telemetry face sheet for immediately available ER MD    Location [facility]-Cardiac & Pulmonary Rehab    Staff Present Coralie Keens, MS, ASCM CEP, Exercise Physiologist;Amanda Oletta Darter, BA, ACSM CEP, Exercise Physiologist;Kelly Rosalia Hammers, MPA, RN    Virtual Visit No    Medication changes reported     No    Fall or balance concerns reported    No    Warm-up and Cool-down Performed on first and last piece of equipment    Resistance Training Performed Yes    VAD Patient? No    PAD/SET Patient? No                Social History   Tobacco Use  Smoking Status Never  Smokeless Tobacco Never    Goals Met:  Independence with exercise equipment Exercise tolerated well Personal goals reviewed No report of concerns or symptoms today Strength training completed today  Goals Unmet:  Not Applicable  Comments:  Cindy Trujillo graduated today from [place]  with 36 sessions completed.  Details of the patient's exercise prescription and what She needs to do in order to continue the prescription and progress were discussed with patient.  Patient was given a copy of prescription and goals.  Patient verbalized understanding.  Cindy Trujillo plans to continue to exercise by attending the [facility].    Dr. Emily Filbert is Medical Director for [place].  Dr. Ottie Glazier is Medical Director for [facility] Pulmonary Rehabilitation.

## 2020-12-26 NOTE — Progress Notes (Signed)
Discharge Progress Report  Patient Details  Name: Cindy Trujillo MRN: [id number] Date of Birth: [birth year] Referring Provider:   April Manson Cancer Associated Rehabilitation & Exercise from 08/13/2020 in [facility] Cardiac and Pulmonary Rehab  Referring Provider Earlie Server MD       Number of Visits: 36  Reason for Discharge:  Patient reached a stable level of exercise. Patient independent in their exercise. Patient has met program and personal goals.   Diagnosis:  Endometrial cancer ([place])  ADL [facility]SD:   Initial Exercise Prescription:  Initial Exercise Prescription - 08/13/20 1400       Date of Initial Exercise RX and Referring Provider   Date 08/13/20    Referring Provider Earlie Server MD      Treadmill   MPH 2.3    Grade 0.5    Minutes 15    METs 2.92      Recumbant Bike   Level 2    RPM 60    Watts 15    Minutes 15    METs 2.4      NuStep   Level 2    SPM 80    Minutes 15    METs 2.4      REL-XR   Level 2    Watts 50    Minutes 15    METs 2.4      Prescription Details   Frequency (times per week) 2    Duration Progress to 30 minutes of continuous aerobic without signs/symptoms of physical distress      Intensity   THRR 40-80% of Max Heartrate 113-143    Ratings of Perceived Exertion 11-13    Perceived Dyspnea 0-4      Resistance Training   Training Prescription Yes    Weight 2 lb    Reps 10-15             Discharge Exercise Prescription (Final Exercise Prescription Changes):  Exercise Prescription Changes - 12/05/20 1300       Response to Exercise   Blood Pressure (Admit) 96/60    Blood Pressure (Exercise) 126/62    Blood Pressure (Exit) 110/62    Heart Rate (Admit) 89 bpm    Heart Rate (Exercise) 114 bpm    Heart Rate (Exit) 88 bpm    Oxygen Saturation (Admit) 96 %    Oxygen Saturation (Exercise) 93 %    Oxygen Saturation (Exit) 98 %    Rating of Perceived Exertion (Exercise) 9    Perceived Dyspnea (Exercise) 0    Symptoms none     Duration Continue with 30 min of aerobic exercise without signs/symptoms of physical distress.    Intensity THRR unchanged      Progression   Progression Continue to progress workloads to maintain intensity without signs/symptoms of physical distress.      Resistance Training   Training Prescription Yes    Weight 2 lb    Reps 10-15      Treadmill   MPH 2.8    Grade 2    Minutes 15    METs 3.91      REL-XR   Level 5    Minutes 15    METs 4.7             Functional Capacity:  6 Minute Walk     Row Name 08/13/20 1358 12/05/20 1333       6 Minute Walk   Phase Initial Discharge    Distance 1162 feet 1365 feet  Distance % Change -- 17.46 %    Distance Feet Change -- 203 ft    Walk Time 6 minutes 6 minutes    # of Rest Breaks 0 0    MPH 2.2 2.58    METS 2.43 2.97    RPE 11 9    Perceived Dyspnea  0 0    VO2 Peak 8.52 10.4    Symptoms No No    Resting HR 84 bpm 89 bpm    Resting BP 106/64 96/60    Resting Oxygen Saturation  97 % 96 %    Exercise Oxygen Saturation  during 6 min walk 95 % 93 %    Max Ex. HR 107 bpm 114 bpm    Max Ex. BP 114/62 126/62    2 Minute Post BP 108/64 110/62              Nutrition & Weight - Outcomes:   Post Biometrics - 12/05/20 1335        Post  Biometrics   Height 5' 4.25" (1.632 m)    Weight 218 lb 8 oz (99.1 kg)    BMI (Calculated) 37.21             Goals reviewed with patient; copy given to patient.

## 2021-01-09 ENCOUNTER — Ambulatory Visit: Payer: 59 | Admitting: Family Medicine

## 2021-01-14 ENCOUNTER — Ambulatory Visit: Payer: Self-pay | Admitting: *Deleted

## 2021-01-14 ENCOUNTER — Emergency Department: Payer: 59

## 2021-01-14 ENCOUNTER — Other Ambulatory Visit: Payer: Self-pay

## 2021-01-14 ENCOUNTER — Emergency Department
Admission: EM | Admit: 2021-01-14 | Discharge: 2021-01-14 | Disposition: A | Discharge status: Home / Self Care | Payer: 59 | Attending: Emergency Medicine | Admitting: Emergency Medicine

## 2021-01-14 DIAGNOSIS — Z79899 Other long term (current) drug therapy: Secondary | ICD-10-CM | POA: Insufficient documentation

## 2021-01-14 DIAGNOSIS — Z7984 Long term (current) use of oral hypoglycemic drugs: Secondary | ICD-10-CM | POA: Diagnosis not present

## 2021-01-14 DIAGNOSIS — R9431 Abnormal electrocardiogram [ECG] [EKG]: Secondary | ICD-10-CM | POA: Diagnosis not present

## 2021-01-14 DIAGNOSIS — Z7982 Long term (current) use of aspirin: Secondary | ICD-10-CM | POA: Diagnosis not present

## 2021-01-14 DIAGNOSIS — Z8544 Personal history of malignant neoplasm of other female genital organs: Secondary | ICD-10-CM | POA: Diagnosis not present

## 2021-01-14 DIAGNOSIS — G510 Bell's palsy: Secondary | ICD-10-CM | POA: Diagnosis not present

## 2021-01-14 DIAGNOSIS — E119 Type 2 diabetes mellitus without complications: Secondary | ICD-10-CM | POA: Insufficient documentation

## 2021-01-14 DIAGNOSIS — M79604 Pain in right leg: Secondary | ICD-10-CM | POA: Insufficient documentation

## 2021-01-14 DIAGNOSIS — R29810 Facial weakness: Secondary | ICD-10-CM | POA: Diagnosis not present

## 2021-01-14 DIAGNOSIS — R519 Headache, unspecified: Secondary | ICD-10-CM | POA: Diagnosis not present

## 2021-01-14 DIAGNOSIS — R200 Anesthesia of skin: Secondary | ICD-10-CM | POA: Diagnosis present

## 2021-01-14 LAB — CBC
HCT: 36.3 % (ref 36.0–46.0)
Hemoglobin: 12.3 g/dL (ref 12.0–15.0)
MCH: 29.4 pg (ref 26.0–34.0)
MCHC: 33.9 g/dL (ref 30.0–36.0)
MCV: 86.8 fL (ref 80.0–100.0)
Platelets: 87 10*3/uL — ABNORMAL LOW (ref 150–400)
RBC: 4.18 MIL/uL (ref 3.87–5.11)
RDW: 14.3 % (ref 11.5–15.5)
WBC: 3.8 10*3/uL — ABNORMAL LOW (ref 4.0–10.5)
nRBC: 0 % (ref 0.0–0.2)

## 2021-01-14 LAB — DIFFERENTIAL
Abs Immature Granulocytes: 0 10*3/uL (ref 0.00–0.07)
Basophils Absolute: 0 10*3/uL (ref 0.0–0.1)
Basophils Relative: 1 %
Eosinophils Absolute: 0.1 10*3/uL (ref 0.0–0.5)
Eosinophils Relative: 3 %
Immature Granulocytes: 0 %
Lymphocytes Relative: 32 %
Lymphs Abs: 1.2 10*3/uL (ref 0.7–4.0)
Monocytes Absolute: 0.3 10*3/uL (ref 0.1–1.0)
Monocytes Relative: 7 %
Neutro Abs: 2.2 10*3/uL (ref 1.7–7.7)
Neutrophils Relative %: 57 %
Smear Review: DECREASED

## 2021-01-14 LAB — COMPREHENSIVE METABOLIC PANEL
ALT: 28 U/L (ref 0–44)
AST: 53 U/L — ABNORMAL HIGH (ref 15–41)
Albumin: 4.1 g/dL (ref 3.5–5.0)
Alkaline Phosphatase: 103 U/L (ref 38–126)
Anion gap: 8 (ref 5–15)
BUN: 20 mg/dL (ref 8–23)
CO2: 22 mmol/L (ref 22–32)
Calcium: 9.3 mg/dL (ref 8.9–10.3)
Chloride: 107 mmol/L (ref 98–111)
Creatinine, Ser: 1.22 mg/dL — ABNORMAL HIGH (ref 0.44–1.00)
GFR, Estimated: 50 mL/min — ABNORMAL LOW (ref >60)
Glucose, Bld: 153 mg/dL — ABNORMAL HIGH (ref 70–99)
Potassium: 4.3 mmol/L (ref 3.5–5.1)
Sodium: 137 mmol/L (ref 135–145)
Total Bilirubin: 1.3 mg/dL — ABNORMAL HIGH (ref 0.3–1.2)
Total Protein: 7.5 g/dL (ref 6.5–8.1)

## 2021-01-14 LAB — PROTIME-INR
INR: 1.1 (ref 0.8–1.2)
Prothrombin Time: 14.4 seconds (ref 11.4–15.2)

## 2021-01-14 LAB — APTT: aPTT: 31 seconds (ref 24–36)

## 2021-01-14 MED ORDER — ARTIFICIAL TEARS OPHTHALMIC OINT
TOPICAL_OINTMENT | Freq: Every evening | OPHTHALMIC | 0 refills | Status: DC | PRN
  Filled 2021-01-14: qty 3.5, fill #0

## 2021-01-14 MED ORDER — PREDNISONE 20 MG PO TABS
60.0000 mg | ORAL_TABLET | Freq: Once | ORAL | Status: AC
  Administered 2021-01-14: 60 mg via ORAL
  Filled 2021-01-14: qty 3

## 2021-01-14 MED ORDER — VALACYCLOVIR HCL 1 G PO TABS
1000.0000 mg | ORAL_TABLET | Freq: Three times a day (TID) | ORAL | 0 refills | Status: AC
  Filled 2021-01-14: qty 21, 7d supply, fill #0

## 2021-01-14 MED ORDER — ARTIFICIAL TEARS OPHTHALMIC OINT
1.0000 "application " | TOPICAL_OINTMENT | Freq: Once | OPHTHALMIC | Status: AC
  Administered 2021-01-14: 1 via OPHTHALMIC
  Filled 2021-01-14: qty 1

## 2021-01-14 MED ORDER — SODIUM CHLORIDE 0.9% FLUSH: 3.0000 mL | Freq: Once | INTRAVENOUS | Status: DC

## 2021-01-14 MED ORDER — VALACYCLOVIR HCL 500 MG PO TABS
1000.0000 mg | ORAL_TABLET | Freq: Once | ORAL | Status: AC
  Administered 2021-01-14: 1000 mg via ORAL
  Filled 2021-01-14: qty 2

## 2021-01-14 MED ORDER — PREDNISONE 20 MG PO TABS
60.0000 mg | ORAL_TABLET | Freq: Every day | ORAL | 0 refills | Status: AC
  Filled 2021-01-14: qty 21, 7d supply, fill #0

## 2021-01-14 NOTE — Telephone Encounter (Signed)
Right-sided facial numbness, right eye droop and unable to swish liquids to the left side of her mouth. Positive for arm droop. Headache and blurred vision last night.Advised ED at this time. Husband will drive her now.  Reported she was on Eliquis until two months ago.   Reason for Disposition  [1] Numbness (i.e., loss of sensation) of the face, arm / hand, or leg / foot on one side of the body AND [2] sudden onset AND [3] present now  Answer Assessment - Initial Assessment Questions 1. SYMPTOM: "What is the main symptom you are concerned about?" (e.g., weakness, numbness)left-sided facial numbness     2. ONSET: "When did this start?" (minutes, hours, days; while sleeping)     AL: "When was the last time you (the patient) were normal (no symptoms)?"     Yesterday 4. PATTERN "Does this come and go, or has it been constant since it started?"  "Is it present now?"     constant 5. CARDIAC SYMPTOMS: "Have you had any of the following symptoms: chest pain, difficulty breathing, palpitations?"     no 6. NEUROLOGIC SYMPTOMS: "Have you had any of the following symptoms: headache, dizziness, vision loss, double vision, changes in speech, unsteady on your feet?"     Headache 7. OTHER SYMPTOMS: "Do you have any other symptoms?"     no 8. PREGNANCY: "Is there any chance you are pregnant?" "When was your last menstrual period?"     na  Protocols used: Neurologic Deficit-A-AH

## 2021-01-14 NOTE — ED Triage Notes (Signed)
Pt to ER via POV with complaints of generalized headache, left sided facial numbness/ dropping. Noticed right eye dropping yesterday that has now resolved. Denies fall or injury  Bilateral grip strength equal. Reports difficulty drinking, no issues swallowing but issues with mouth drooping. L sided facial droop noted in triage.   7/18 stopped taking eliquis for a bone marrow biopsy. 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   LKW yesterday.  

## 2021-01-15 ENCOUNTER — Other Ambulatory Visit: Payer: Self-pay

## 2021-01-15 NOTE — ED Provider Notes (Signed)
[facility] Emergency Department Provider Note   ____________________________________________   Event Date/Time   First MD Initiated Contact with Patient 01/14/21 2156     (approximate)  I have reviewed the triage vital signs and the nursing notes.   HISTORY  Chief Complaint Headache    HPI Cindy Trujillo is a 65 y.o. female who presents for left facial numbness and facial droop  LOCATION: Left face DURATION: 1 day prior to arrival TIMING: Worsening since onset SEVERITY: Severe QUALITY: Facial numbness and droop on the left side CONTEXT: Patient states that she began noticing a difficulty smiling as well as difficulty holding food and liquids in her mouth with dribbling on the left side as well as husband noting inability to fully close the left eye MODIFYING FACTORS: Denies any exacerbating or relieving factors ASSOCIATED SYMPTOMS: Denies any associated vision changes or weakness/numbness/paresthesias in any other extremity   Per medical record review, patient has history of type 2 diabetes          Past Medical History:  Diagnosis Date   Anemia    Diabetes mellitus without complication ([place])    DVT (deep venous thrombosis) ([place]) 08/2018   right leg   GERD (gastroesophageal reflux disease)    Hyperlipidemia     Patient Active Problem List   Diagnosis Date Noted   Fatty liver disease, nonalcoholic 62/70/3500   IDA (iron deficiency anemia) 07/01/2020   Thrombocytopenia ([place]) 03/28/2020   Normocytic anemia 01/24/2020   Elevated LFTs 01/24/2020   Elevated bilirubin 01/24/2020   Hypocalcemia 01/24/2020   S/P hysterectomy with oophorectomy 12/07/2019   Endometrial cancer ([place]) 12/06/2019   Post-menopausal bleeding 09/21/2019   Cervical cancer screening 09/21/2019   Annual physical exam 09/21/2019   DVT (deep venous thrombosis) ([place]) 11/29/2018   Acute deep vein thrombosis (DVT) of femoral vein of right lower extremity ([place])  10/12/2018   Microalbuminuria due to type 2 diabetes mellitus ([place]) 06/23/2018   Varicose veins of leg with pain, bilateral 05/20/2018   Varicose veins of both lower extremities 08/27/2017   T2DM (type 2 diabetes mellitus) ([place])    Hyperlipidemia associated with type 2 diabetes mellitus ([place])    Allergic rhinitis 12/11/2016   GERD (gastroesophageal reflux disease) 12/11/2016   Morbid obesity ([place]) 12/11/2016    Past Surgical History:  Procedure Laterality Date   COLONOSCOPY WITH PROPOFOL N/A 08/23/2017   Procedure: COLONOSCOPY WITH PROPOFOL;  Surgeon: Manya Silvas, MD;  Location: [facility] ENDOSCOPY;  Service: Endoscopy;  Laterality: N/A;   ENDOVENOUS ABLATION SAPHENOUS VEIN W/ LASER Left 02/29/2020   endovenous laser ablation left greater saphenous vein and stab phlebectomy 10-20 incisions left leg by Gae Gallop MD    HYSTEROSCOPY WITH D & C N/A 10/31/2019   Procedure: DILATATION AND CURETTAGE /HYSTEROSCOPY WITH MYOSURE RESECTION;  Surgeon: Homero Fellers, MD;  Location: [facility] ORS;  Service: Gynecology;  Laterality: N/A;   ROBOTIC ASSISTED TOTAL HYSTERECTOMY WITH BILATERAL SALPINGO OOPHERECTOMY N/A 12/06/2019   Procedure: XI ROBOTIC ASSISTED TOTAL HYSTERECTOMY WITH BILATERAL SALPINGO OOPHORECTOMY;  Surgeon: Gillis Ends, MD;  Location: [facility] ORS;  Service: Gynecology;  Laterality: N/A;   SENTINEL NODE BIOPSY N/A 12/06/2019   Procedure: SENTINEL NODE BIOPSY;  Surgeon: Gillis Ends, MD;  Location: [facility] ORS;  Service: Gynecology;  Laterality: N/A;   TONSILLECTOMY     TONSILLECTOMY AND ADENOIDECTOMY  1968   VEIN SURGERY  05/2019   laser    Prior to Admission medications   Medication Sig Start Date  End Date Taking? Authorizing Provider  artificial tears (LACRILUBE) OINT ophthalmic ointment Place into the left eye at bedtime as needed for dry eyes. 01/14/21  Yes Naaman Plummer, MD  predniSONE (DELTASONE) 20 MG tablet Take 3 tablets (60 mg total) by mouth daily  with breakfast for 7 days. 01/14/21 01/22/21 Yes Shelitha Magley, [place] Lawman, MD  valACYclovir (VALTREX) 1000 MG tablet Take 1 tablet (1,000 mg total) by mouth 3 (three) times daily for 7 days. 01/14/21 01/22/21 Yes Naaman Plummer, MD  acetaminophen (TYLENOL) 500 MG tablet Take 500 mg by mouth every 6 (six) hours as needed (for pain.).     [provider]  aspirin EC 81 MG tablet Take 1 tablet (81 mg total) by mouth daily. Swallow whole. 11/15/20   Earlie Server, MD  Blood Glucose Monitoring Suppl (FREESTYLE LITE) [facility]  06/26/19   [provider]  dapagliflozin propanediol (FARXIGA) 10 MG TABS tablet Take 1 tablet (10 mg total) by mouth daily. 03/28/20   Virginia Crews, MD  ferrous sulfate 325 (65 FE) MG tablet TAKE 1 TABLET BY MOUTH TWICE DAILY 07/04/20 07/04/21  Earlie Server, MD  FreeStyle Unistick II Lancets MISC Use as instructed to check blood glucose daily 06/26/19   Virginia Crews, MD  glucose blood (FREESTYLE TEST STRIPS) test strip Use as instructed to check blood glucose daily 06/26/19   Virginia Crews, MD  glucose monitoring kit (FREESTYLE) monitoring kit 1 each by Does not apply route as needed for other. 06/26/19   Virginia Crews, MD  Lancets (FREESTYLE) lancets  06/26/19   [provider]  lisinopril (ZESTRIL) 5 MG tablet TAKE 1 TABLET (5 MG TOTAL) BY MOUTH DAILY. 03/28/20 03/28/21  Virginia Crews, MD  loratadine (CLARITIN) 10 MG tablet Take 1 tablet (10 mg total) by mouth daily. 03/28/20   Virginia Crews, MD  Magnesium 400 MG TABS Take 1 tablet by mouth daily.    [provider]  metFORMIN (GLUCOPHAGE) 1000 MG tablet TAKE 1 TABLET BY MOUTH TWICE DAILY WITH A MEAL. 03/28/20 03/28/21  Virginia Crews, MD  Multiple Vitamin (MULTIVITAMIN WITH MINERALS) TABS tablet Take 1 tablet by mouth 2 (two) times a week.    [provider]  omeprazole (PRILOSEC) 20 MG capsule Take 1 capsule (20 mg total) by mouth daily. 03/28/20   Virginia Crews, MD  rosuvastatin (CRESTOR) 5 MG tablet Take 1 tablet (5 mg total) by mouth daily. 03/28/20   Virginia Crews, MD  vitamin B-12 (CYANOCOBALAMIN) 1000 MCG tablet Take 1 tablet (1,000 mcg total) by mouth daily. Patient taking differently: Take 1,000 mcg by mouth every evening. 03/21/19   Earlie Server, MD    Allergies Sulfa antibiotics  Family History  Problem Relation Age of Onset   COPD Mother        heavy smoker   Pulmonary fibrosis Mother    Fibromyalgia Mother    COPD Father    Melanoma Father    COPD Sister    COPD Brother    COPD Sister    COPD Sister    Healthy Brother    Heart failure Maternal Grandmother    Heart attack Maternal Grandfather    Cancer Paternal Grandmother        unsure of type, had mets in colon   Heart attack Paternal Grandfather    Cancer Paternal Grandfather        unknown   Breast cancer Neg Hx    Cervical cancer Neg  Hx     Social History Social History   Tobacco Use   Smoking status: Never   Smokeless tobacco: Never  Vaping Use   Vaping Use: Never used  Substance Use Topics   Alcohol use: No    Alcohol/week: 0.0 standard drinks   Drug use: No    Review of Systems Constitutional: No fever/chills Eyes: No visual changes. ENT: No sore throat. Cardiovascular: Denies chest pain. Respiratory: Denies shortness of breath. Gastrointestinal: No abdominal pain.  No nausea, no vomiting.  No diarrhea. Genitourinary: Negative for dysuria. Musculoskeletal: Negative for acute arthralgias Skin: Negative for rash. Neurological: Positive for left facial numbness and facial droop.  Negative for headaches, weakness/numbness/paresthesias in any extremity Psychiatric: Negative for suicidal ideation/homicidal ideation   ____________________________________________   PHYSICAL EXAM:  VITAL SIGNS: ED Triage Vitals  Enc Vitals Group     BP 01/14/21 1521 131/69     Pulse Rate 01/14/21 1521 70     Resp 01/14/21 1521 16     Temp 01/14/21  1521 98.6 F (37 C)     Temp src --      SpO2 01/14/21 1521 98 %     Weight 01/14/21 1523 218 lb 0.6 oz (98.9 kg)     Height 01/14/21 1523 5' 4"  (1.626 m)     Head Circumference --      Peak Flow --      Pain Score 01/14/21 1523 8     Pain Loc --      Pain Edu? --      Excl. in [place]? --    Constitutional: Alert and oriented. Well appearing and in no acute distress. Eyes: Conjunctivae are normal. PERRL.  Inability to fully close left eyelid Head: Atraumatic. Nose: No congestion/rhinnorhea. Mouth/Throat: Mucous membranes are moist. Neck: No stridor Cardiovascular: Grossly normal heart sounds.  Good peripheral circulation. Respiratory: Normal respiratory effort.  No retractions. Gastrointestinal: Soft and nontender. No distention. Musculoskeletal: No obvious deformities Neurologic:  Normal speech and language.  None forehead sparing paralysis over the left facial nerve distribution with asymmetric smile, tongue midline Skin:  Skin is warm and dry. No rash noted. Psychiatric: Mood and affect are normal. Speech and behavior are normal.  ____________________________________________   LABS (all labs ordered are listed, but only abnormal results are displayed)  Labs Reviewed  CBC - Abnormal; Notable for the following components:      Result Value   WBC 3.8 (*)    Platelets 87 (*)    All other components within normal limits  COMPREHENSIVE METABOLIC PANEL - Abnormal; Notable for the following components:   Glucose, Bld 153 (*)    Creatinine, Ser 1.22 (*)    AST 53 (*)    Total Bilirubin 1.3 (*)    GFR, Estimated 50 (*)    All other components within normal limits  PROTIME-INR  APTT  DIFFERENTIAL  CBG MONITORING, ED   ____________________________________________  EKG  ED ECG REPORT I, Naaman Plummer, the attending physician, personally viewed and interpreted this ECG.  Date: 01/15/2021 EKG Time: 1529 Rate: 70 Rhythm: normal sinus rhythm QRS Axis: normal Intervals:  normal ST/T Wave abnormalities: normal Narrative Interpretation: no evidence of acute ischemia  ____________________________________________  RADIOLOGY  ED MD interpretation: CT of the head without contrast shows no evidence of acute abnormalities including no intracerebral hemorrhage, obvious masses, or significant edema  Official radiology report(s): CT HEAD WO CONTRAST  Result Date: 01/14/2021 CLINICAL DATA:  Headache.  Left facial droop and numbness. EXAM:  CT HEAD WITHOUT CONTRAST TECHNIQUE: Contiguous axial images were obtained from the base of the skull through the vertex without intravenous contrast. COMPARISON:  None. FINDINGS: Brain: No evidence of acute infarction, hemorrhage, hydrocephalus, extra-axial collection or mass lesion/mass effect. Vascular: No hyperdense vessel or unexpected calcification. Skull: Normal. Negative for fracture or focal lesion. Sinuses/Orbits: No acute finding. Other: None. IMPRESSION: 1. Normal noncontrast head CT. Electronically Signed   By: Titus Dubin M.D.   On: 01/14/2021 16:30    ____________________________________________   PROCEDURES  Procedure(s) performed (including Critical Care):  .1-3 Lead EKG Interpretation Performed by: Naaman Plummer, MD Authorized by: Naaman Plummer, MD     Interpretation: normal     ECG rate:  67   ECG rate assessment: normal     Rhythm: sinus rhythm     Ectopy: none     Conduction: normal     ____________________________________________   INITIAL IMPRESSION / ASSESSMENT AND PLAN / ED COURSE  As part of my medical decision making, I reviewed the following data within the electronic medical record, if available:  Nursing notes reviewed and incorporated, Labs reviewed, EKG interpreted, Old chart reviewed, Radiograph reviewed and Notes from prior ED visits reviewed and incorporated        + Acute, painless, unilateral facial paralysis w no forehead sparing ML 2/2 Bells Palsy.  Unlikely CVA,  trigeminal neuralgia, Botulism, MG, ICH.  Rx: - Artificial tears qhr while patient is awake AND ophthalmic ointment at night to prevent corneal exposure keratitis (consider protective goggles or taping) - Since within 72hours of onset will Rx Prednisone 75m qday x1wk. - Valacyclovir 10053mTID x1wk  Disposition: SRP and PCP follow up within the next week discussed. Advised if with incomplete facial recovery 3 months after initial symptom onset may need referral to ophthalmology +/- facial nerve specialist.      ____________________________________________   FINAL CLINICAL IMPRESSION(S) / ED DIAGNOSES  Final diagnoses:  Facial paralysis/Bells palsy     ED Discharge Orders          Ordered    valACYclovir (VALTREX) 1000 MG tablet  3 times daily        01/14/21 2238    predniSONE (DELTASONE) 20 MG tablet  Daily with breakfast        01/14/21 2238    artificial tears (LACRILUBE) OINT ophthalmic ointment  At bedtime PRN        01/14/21 2238             Note:  This document was prepared using Dragon voice recognition software and may include unintentional dictation errors.    BrNaaman PlummerMD 01/15/21 [phone]

## 2021-01-17 ENCOUNTER — Telehealth: Payer: Self-pay

## 2021-01-17 NOTE — Telephone Encounter (Signed)
Copied from [place] [id number]. Topic: Appointment Scheduling - Scheduling Inquiry for Clinic >> Jan 16, 2021  5:07 PM Erick Blinks wrote: Reason for CRM: Pt needs a hosp fu, says she needs to be worked in next Thursday or Friday Best contact: [phone]

## 2021-01-17 NOTE — Telephone Encounter (Signed)
Patient diagnosed with Bell's Palsy on 01/14/21 at ED.    She is suppose to have a f/u visit w/ PCP on 10/13 or 01/24/21.  Please advise what to do.

## 2021-01-20 NOTE — Telephone Encounter (Signed)
11 am Friday is the only other spot I could work her in this week.

## 2021-01-20 NOTE — Telephone Encounter (Signed)
Please see other phone encounter

## 2021-01-20 NOTE — Telephone Encounter (Signed)
Patient not able to come in today. Please advise.

## 2021-01-20 NOTE — Telephone Encounter (Signed)
Could fit her in at 320 this afternoon

## 2021-01-22 ENCOUNTER — Inpatient Hospital Stay: Payer: 59

## 2021-01-23 ENCOUNTER — Telehealth: Payer: Self-pay

## 2021-01-23 NOTE — Telephone Encounter (Signed)
Copied from [place] [phone]. Topic: Appointment Scheduling - Scheduling Inquiry for Clinic >> Jan 23, 2021 10:10 AM Valere Dross wrote: Reason for CRM: Pt called in needing to get an appt asap, for her hospital f/u and to get some documents signed to go back to work, please advise.

## 2021-01-24 ENCOUNTER — Other Ambulatory Visit: Payer: Self-pay

## 2021-01-24 ENCOUNTER — Ambulatory Visit: Payer: 59 | Admitting: Family Medicine

## 2021-01-24 ENCOUNTER — Telehealth: Payer: Self-pay

## 2021-01-24 ENCOUNTER — Encounter: Payer: Self-pay | Admitting: Family Medicine

## 2021-01-24 VITALS — BP 141/85 | HR 77 | Temp 98.3°F | Ht 64.0 in | Wt 215.0 lb

## 2021-01-24 DIAGNOSIS — G510 Bell's palsy: Secondary | ICD-10-CM

## 2021-01-24 DIAGNOSIS — R519 Headache, unspecified: Secondary | ICD-10-CM | POA: Diagnosis not present

## 2021-01-24 MED ORDER — ARTIFICIAL TEARS OPHTHALMIC OINT
TOPICAL_OINTMENT | Freq: Every evening | OPHTHALMIC | 1 refills | Status: DC | PRN
  Filled 2021-01-24: qty 7, fill #0

## 2021-01-24 MED ORDER — NORTRIPTYLINE HCL 10 MG PO CAPS
10.0000 mg | ORAL_CAPSULE | Freq: Every day | ORAL | 2 refills | Status: DC
  Filled 2021-01-24: qty 30, 30d supply, fill #0

## 2021-01-24 MED FILL — Metformin HCl Tab 1000 MG: ORAL | 90 days supply | Qty: 180 | Fill #1 | Status: AC

## 2021-01-24 NOTE — Assessment & Plan Note (Signed)
Recent diagnosis on 10/4 S/p prednisone and valtrex Continue eye care At next visit, if not improving, close to 40mmark, will refer to Optho and Neuro FMLA forms completed today

## 2021-01-24 NOTE — Telephone Encounter (Signed)
Copied from [place] [id number]. Topic: General - Other >> Jan 24, 2021 12:51 PM Valere Dross wrote: Reason for CRM: Pt called in stating someone she spoke with stated her FMLA papers wasn't able to be faxed, and she called in to give another fax number, [phone] attn: Tandy Gaw

## 2021-01-24 NOTE — Progress Notes (Signed)
Established patient visit   Patient: Cindy Trujillo   DOB: [birth year]   65 y.o. Female  MRN: [id number] Visit Date: 01/24/2021  Today's healthcare provider: Lavon Paganini, MD   Chief Complaint  Patient presents with   Bells Palsy   Subjective    HPI  Follow up ER visit  Patient was seen at [facility] ER for headache on 01/14/21. She was treated for Bells Palsy. Treatment for this included valtrex, prednisone, and artificial tears. She reports good compliance with treatment. She reports this condition is Unchanged.   Patient wants to know if she should go back on Eliquis but she has had a lot of headaches and pain behind her left eye. Worried about stroke risk. Is on daily ASA. Patient reports wearing eye mask at night due to eye not completely closing.  -----------------------------------------------------------------------------------------     Medications: Outpatient Medications Prior to Visit  Medication Sig   acetaminophen (TYLENOL) 500 MG tablet Take 500 mg by mouth every 6 (six) hours as needed (for pain.).    aspirin EC 81 MG tablet Take 1 tablet (81 mg total) by mouth daily. Swallow whole.   Blood Glucose Monitoring Suppl (FREESTYLE LITE) [facility]    dapagliflozin propanediol (FARXIGA) 10 MG TABS tablet Take 1 tablet (10 mg total) by mouth daily.   ferrous sulfate 325 (65 FE) MG tablet TAKE 1 TABLET BY MOUTH TWICE DAILY   FreeStyle Unistick II Lancets MISC Use as instructed to check blood glucose daily   glucose blood (FREESTYLE TEST STRIPS) test strip Use as instructed to check blood glucose daily   glucose monitoring kit (FREESTYLE) monitoring kit 1 each by Does not apply route as needed for other.   Lancets (FREESTYLE) lancets    lisinopril (ZESTRIL) 5 MG tablet TAKE 1 TABLET (5 MG TOTAL) BY MOUTH DAILY.   loratadine (CLARITIN) 10 MG tablet Take 1 tablet (10 mg total) by mouth daily.   Magnesium 400 MG TABS Take 1 tablet by mouth daily.   metFORMIN  (GLUCOPHAGE) 1000 MG tablet TAKE 1 TABLET BY MOUTH TWICE DAILY WITH A MEAL.   Multiple Vitamin (MULTIVITAMIN WITH MINERALS) TABS tablet Take 1 tablet by mouth 2 (two) times a week.   omeprazole (PRILOSEC) 20 MG capsule Take 1 capsule (20 mg total) by mouth daily.   rosuvastatin (CRESTOR) 5 MG tablet Take 1 tablet (5 mg total) by mouth daily.   vitamin B-12 (CYANOCOBALAMIN) 1000 MCG tablet Take 1 tablet (1,000 mcg total) by mouth daily. (Patient taking differently: Take 1,000 mcg by mouth every evening.)   [DISCONTINUED] artificial tears (LACRILUBE) OINT ophthalmic ointment Place into the left eye at bedtime as needed for dry eyes.   No facility-administered medications prior to visit.    Review of Systems  Constitutional: Negative.   Eyes: Negative.   Respiratory: Negative.    Cardiovascular: Negative.   Neurological:  Positive for facial asymmetry, speech difficulty and headaches. Negative for dizziness.   Last CBC Lab Results  Component Value Date   WBC 3.8 (L) 01/14/2021   HGB 12.3 01/14/2021   HCT 36.3 01/14/2021   MCV 86.8 01/14/2021   MCH 29.4 01/14/2021   RDW 14.3 01/14/2021   PLT 87 (L) 85/92/9244   Last metabolic panel Lab Results  Component Value Date   GLUCOSE 153 (H) 01/14/2021   NA 137 01/14/2021   K 4.3 01/14/2021   CL 107 01/14/2021   CO2 22 01/14/2021   BUN 20 01/14/2021   CREATININE 1.22 (H)  01/14/2021   EGFR 49 (L) 10/04/2020   GFRNONAA 50 (L) 01/14/2021   CALCIUM 9.3 01/14/2021   PROT 7.5 01/14/2021   ALBUMIN 4.1 01/14/2021   LABGLOB 3.1 10/04/2020   AGRATIO 1.4 10/04/2020   BILITOT 1.3 (H) 01/14/2021   ALKPHOS 103 01/14/2021   AST 53 (H) 01/14/2021   ALT 28 01/14/2021   ANIONGAP 8 01/14/2021       Objective    BP (!) 141/85 (BP Location: Left Arm, Patient Position: Sitting, Cuff Size: Normal)   Pulse 77   Temp 98.3 F (36.8 C) (Oral)   Ht _0  (1.626 m)   Wt 215 lb (97.5 kg)   BMI 36.90 kg/m  BP Readings from Last 3 Encounters:   01/24/21 (!) 141/85  01/14/21 108/63  11/15/20 111/76   Wt Readings from Last 3 Encounters:  01/24/21 215 lb (97.5 kg)  01/14/21 218 lb 0.6 oz (98.9 kg)  12/05/20 218 lb 8 oz (99.1 kg)      Physical Exam Vitals reviewed.  Constitutional:      General: She is not in acute distress.    Appearance: Normal appearance. She is well-developed. She is not diaphoretic.  HENT:     Head: Normocephalic and atraumatic.  Eyes:     General: No scleral icterus.    Conjunctiva/sclera: Conjunctivae normal.  Neck:     Thyroid: No thyromegaly.  Cardiovascular:     Rate and Rhythm: Normal rate and regular rhythm.     Pulses: Normal pulses.     Heart sounds: Normal heart sounds. No murmur heard. Pulmonary:     Effort: Pulmonary effort is normal. No respiratory distress.     Breath sounds: Normal breath sounds. No wheezing, rhonchi or rales.  Musculoskeletal:     Cervical back: Neck supple.     Right lower leg: No edema.     Left lower leg: No edema.  Lymphadenopathy:     Cervical: No cervical adenopathy.  Skin:    General: Skin is warm and dry.     Findings: No rash.  Neurological:     Mental Status: She is alert and oriented to person, place, and time.     Comments: Non-sparing of forehead with L sided facial droop, slight slurred speech  Psychiatric:        Mood and Affect: Mood normal.        Behavior: Behavior normal.      No results found for any visits on 01/24/21.  Assessment & Plan     Problem List Items Addressed This Visit       Nervous and Auditory   Bell's palsy - Primary    Recent diagnosis on 10/4 S/p prednisone and valtrex Continue eye care At next visit, if not improving, close to 24mmark, will refer to Optho and Neuro FMLA forms completed today      Relevant Medications   nortriptyline (PAMELOR) 10 MG capsule     Other   Persistent headaches    New problem Negative head CT Trial of nortriptyline 165mqhs Return precautions discussed       Relevant Medications   nortriptyline (PAMELOR) 10 MG capsule     Return in about 2 months (around 03/26/2021) for chronic disease f/u, as scheduled.      Total time spent on today's visit was greater than 30 minutes, including both face-to-face time and nonface-to-face time personally spent on review of chart (labs and imaging), discussing labs and goals, discussing further work-up, treatment options,  referrals to specialist if needed, reviewing outside records of pertinent, answering patient's questions, and coordinating care.    I, Lavon Paganini, MD, have reviewed all documentation for this visit. The documentation on 01/24/21 for the exam, diagnosis, procedures, and orders are all accurate and complete.   Rydell Wiegel, Dionne Bucy, MD, MPH [facility]

## 2021-01-24 NOTE — Assessment & Plan Note (Signed)
New problem Negative head CT Trial of nortriptyline 74m qhs Return precautions discussed

## 2021-01-27 ENCOUNTER — Other Ambulatory Visit: Payer: Self-pay

## 2021-02-12 ENCOUNTER — Ambulatory Visit: Payer: 59

## 2021-02-26 ENCOUNTER — Inpatient Hospital Stay: Payer: 59 | Attending: Obstetrics and Gynecology | Admitting: Obstetrics and Gynecology

## 2021-02-26 ENCOUNTER — Other Ambulatory Visit: Payer: Self-pay

## 2021-02-26 VITALS — BP 122/65 | HR 88 | Temp 97.8°F | Resp 18 | Wt 216.1 lb

## 2021-02-26 DIAGNOSIS — Z7984 Long term (current) use of oral hypoglycemic drugs: Secondary | ICD-10-CM | POA: Diagnosis not present

## 2021-02-26 DIAGNOSIS — Z6837 Body mass index (BMI) 37.0-37.9, adult: Secondary | ICD-10-CM | POA: Insufficient documentation

## 2021-02-26 DIAGNOSIS — I8393 Asymptomatic varicose veins of bilateral lower extremities: Secondary | ICD-10-CM | POA: Diagnosis not present

## 2021-02-26 DIAGNOSIS — D696 Thrombocytopenia, unspecified: Secondary | ICD-10-CM | POA: Diagnosis not present

## 2021-02-26 DIAGNOSIS — Z8542 Personal history of malignant neoplasm of other parts of uterus: Secondary | ICD-10-CM | POA: Diagnosis not present

## 2021-02-26 DIAGNOSIS — Z90722 Acquired absence of ovaries, bilateral: Secondary | ICD-10-CM | POA: Insufficient documentation

## 2021-02-26 DIAGNOSIS — E119 Type 2 diabetes mellitus without complications: Secondary | ICD-10-CM | POA: Diagnosis not present

## 2021-02-26 DIAGNOSIS — D61818 Other pancytopenia: Secondary | ICD-10-CM | POA: Diagnosis not present

## 2021-02-26 DIAGNOSIS — R809 Proteinuria, unspecified: Secondary | ICD-10-CM | POA: Insufficient documentation

## 2021-02-26 DIAGNOSIS — Z90710 Acquired absence of both cervix and uterus: Secondary | ICD-10-CM | POA: Diagnosis not present

## 2021-02-26 DIAGNOSIS — D509 Iron deficiency anemia, unspecified: Secondary | ICD-10-CM | POA: Insufficient documentation

## 2021-02-26 DIAGNOSIS — D649 Anemia, unspecified: Secondary | ICD-10-CM | POA: Insufficient documentation

## 2021-02-26 DIAGNOSIS — R7401 Elevation of levels of liver transaminase levels: Secondary | ICD-10-CM | POA: Diagnosis not present

## 2021-02-26 DIAGNOSIS — K760 Fatty (change of) liver, not elsewhere classified: Secondary | ICD-10-CM | POA: Insufficient documentation

## 2021-02-26 DIAGNOSIS — Z86718 Personal history of other venous thrombosis and embolism: Secondary | ICD-10-CM | POA: Diagnosis not present

## 2021-02-26 DIAGNOSIS — E785 Hyperlipidemia, unspecified: Secondary | ICD-10-CM | POA: Diagnosis not present

## 2021-02-26 DIAGNOSIS — K219 Gastro-esophageal reflux disease without esophagitis: Secondary | ICD-10-CM | POA: Insufficient documentation

## 2021-02-26 DIAGNOSIS — C541 Malignant neoplasm of endometrium: Secondary | ICD-10-CM

## 2021-02-26 NOTE — Progress Notes (Signed)
Gynecologic Oncology Interval Visit   Referring Provider: Adrian Prows, MD  Chief Concern: endometrial cancer  Subjective:  Cindy Trujillo is a 65 y.o. G0P0 female who is seen in consultation from Dr. Gilman Schmidt for endometrial cancer.   She presents today for surveillance.   At her postoperative visit repeat labs to follow up anemia, elevated LFTs/bili and low calcium. She is seeing Dr. Tasia Catchings for management. Most recent iron study showed saturation of 8 with normal iron, ferritin, and TIBC levels. AST stable at 44 and ALT normal and bilirubin; calcium normalized.   Gynecologic Oncology History Cindy Trujillo is a pleasant female who is seen in consultation from Dr. Gilman Schmidt for endometrial cancer.  She reported PMB which has been occurring on and off since June of 2021.  Evaluation included the following:   Pelvic US 09/21/2019 FINDINGS: Uterus: measurements: 8.6 x 3.2 x 4.9 cm = volume: 71.8 mL. Uterus is anteverted. No fibroids or other mass. Endometrium: Focal endometrial lesion/thickening measuring 1.1 x 0.9 x 1.4 cm seen within the central aspect of the endometrial complex. Associated scattered areas of internal vascularity. Elsewhere, the endometrial stripe is thin measuring no more than 2 mm.  Right ovary: Not visualized.  No adnexal mass. Left ovary: Measurements: 1.9 x 1.5 x 1.9 cm = volume: 1.7 mL. Normal appearance/no adnexal mass. IMPRESSION: 1. 1.1 x 0.9 x 1.4 cm focal endometrial lesion/thickening as above. Finding is nonspecific, with primary differential considerations including an endometrial polyp, focal endometrial hyperplasia, or possibly endometrial carcinoma. In the setting of post-menopausal bleeding, endometrial sampling is indicated to exclude carcinoma. If results are benign, sonohysterogram should be considered for focal lesion work-up prior to hysteroscopy. (Ref: Radiological Reasoning: Algorithmic Workup of Abnormal Vaginal Bleeding with Endovaginal Sonography  and Sonohysterography. AJR 2008; 771:H65-79). 2. Otherwise normal sonographic appearance of the uterus. 3. Normal left ovary, with nonvisualization of the right ovary. No adnexal mass or free fluid.  Pap 09/21/2019 NILM; HRHPV negative  10/31/2019 D&C with Myosure Uterus sounded to 9 cm.  Hysteroscopy revealed polypoid tissue growing from the right cornua.   Pathology A. ENDOMETRIUM, CURETTAGE:  - ENDOMETRIAL ADENOCARCINOMA, ENDOMETRIOID TYPE, FIGO GRADE 1.  - BACKGROUND EIN / ATYPICAL HYPERPLASIA.  Of note she had a DVT in May of 2020. Currently on Eliquis. DVT was found incidentally to her procedure for varicose veins.    She also has h/o low platelets and follows up with hematology/oncology for a bone marrow biopsy.    12/06/2019 She underwent Exam under anesthesia, Robotic assisted total hysterectomy and bilateral salpingo-oophorectomy with sentinel node injection, mapping, and biopsies.        - ENDOMETRIOID ENDOMETRIAL ADENOCARCINOMA, LOW GRADE (FIGO 1), INVASIVE 2 MM OF 16 MM TOTAL MYOMETRIAL THICKNESS (12.5%).       - FALLOPIAN TUBES and OVARIES: BENIGN  B. LYMPH NODE, RIGHT EXTERNAL ILIAC SENTINEL; EXCISION:  - ONE LYMPH NODE, NEGATIVE FOR MALIGNANCY (0/1).  C. LYMPH NODE, LEFT EXTERNAL ILIAC SENTINEL; EXCISION:  - THREE LYMPH NODES, NEGATIVE FOR MALIGNANCY (0/3).  D. LYMPH NODE, RIGHT EXTERNAL ILIAC; EXCISION:  - TWO LYMPH NODES, NEGATIVE FOR MALIGNANCY (0/2).   Immunohistochemistry (IHC) Testing for DNA Mismatch Repair (MMR)  Proteins:  Results:  MLH1: Loss of protein expression  MSH2: Intact nuclear expression  MSH6: Intact nuclear expression  PMS2: Loss of protein expression  IHC Interpretation: Abnormal result with absent nuclear staining for  MLH1 and PMS2, high probability of MSI-H.   MLH1 Methylation Analysis  MLH1 Result: POSITIVE  Problem List: Patient Active Problem List   Diagnosis Date Noted   Bell's palsy 01/24/2021   Persistent headaches  01/24/2021   Fatty liver disease, nonalcoholic 77/41/4239   IDA (iron deficiency anemia) 07/01/2020   Thrombocytopenia ([place]) 03/28/2020   Normocytic anemia 01/24/2020   Elevated LFTs 01/24/2020   Elevated bilirubin 01/24/2020   Hypocalcemia 01/24/2020   S/P hysterectomy with oophorectomy 12/07/2019   Endometrial cancer ([place]) 12/06/2019   Post-menopausal bleeding 09/21/2019   Cervical cancer screening 09/21/2019   Annual physical exam 09/21/2019   DVT (deep venous thrombosis) ([place]) 11/29/2018   Microalbuminuria due to type 2 diabetes mellitus ([place]) 06/23/2018   Varicose veins of leg with pain, bilateral 05/20/2018   Varicose veins of both lower extremities 08/27/2017   T2DM (type 2 diabetes mellitus) ([place])    Hyperlipidemia associated with type 2 diabetes mellitus ([place])    Allergic rhinitis 12/11/2016   GERD (gastroesophageal reflux disease) 12/11/2016   Morbid obesity ([place]) 12/11/2016    Past Medical History: Past Medical History:  Diagnosis Date   Anemia    Bell's palsy    Diabetes mellitus without complication ([place])    DVT (deep venous thrombosis) ([place]) 08/2018   right leg   GERD (gastroesophageal reflux disease)    Hyperlipidemia     Past Surgical History: Past Surgical History:  Procedure Laterality Date   COLONOSCOPY WITH PROPOFOL N/A 08/23/2017   Procedure: COLONOSCOPY WITH PROPOFOL;  Surgeon: Manya Silvas, MD;  Location: [facility] ENDOSCOPY;  Service: Endoscopy;  Laterality: N/A;   ENDOVENOUS ABLATION SAPHENOUS VEIN W/ LASER Left 02/29/2020   endovenous laser ablation left greater saphenous vein and stab phlebectomy 10-20 incisions left leg by Gae Gallop MD    HYSTEROSCOPY WITH D & C N/A 10/31/2019   Procedure: DILATATION AND CURETTAGE /HYSTEROSCOPY WITH MYOSURE RESECTION;  Surgeon: Homero Fellers, MD;  Location: [facility] ORS;  Service: Gynecology;  Laterality: N/A;   ROBOTIC ASSISTED TOTAL HYSTERECTOMY WITH BILATERAL SALPINGO OOPHERECTOMY N/A 12/06/2019    Procedure: XI ROBOTIC ASSISTED TOTAL HYSTERECTOMY WITH BILATERAL SALPINGO OOPHORECTOMY;  Surgeon: Gillis Ends, MD;  Location: [facility] ORS;  Service: Gynecology;  Laterality: N/A;   SENTINEL NODE BIOPSY N/A 12/06/2019   Procedure: SENTINEL NODE BIOPSY;  Surgeon: Gillis Ends, MD;  Location: [facility] ORS;  Service: Gynecology;  Laterality: N/A;   TONSILLECTOMY     TONSILLECTOMY AND ADENOIDECTOMY  1968   VEIN SURGERY  05/2019   laser    Past Gynecologic History:  Gynecological History Menarche: 44 Menopause: 63 Describes periods as regular, monthly Last pap smear: 09/21/2019- NIL Last Mammogram: None, patient only has clinical exams- declines mammogram   Hx OCP usage for for 15-18 years Hx obesity for 20 years    OB History:  G0P0 OB History  Gravida Para Term Preterm AB Living  0 0 0 0 0 0  SAB IAB Ectopic Multiple Live Births  0 0 0 0 0    Family History: Family History  Problem Relation Age of Onset   COPD Mother        heavy smoker   Pulmonary fibrosis Mother    Fibromyalgia Mother    COPD Father    Melanoma Father    COPD Sister    COPD Brother    COPD Sister    COPD Sister    Healthy Brother    Heart failure Maternal Grandmother    Heart attack Maternal Grandfather    Cancer Paternal Grandmother        unsure of  type, had mets in colon   Heart attack Paternal Grandfather    Cancer Paternal Grandfather        unknown   Breast cancer Neg Hx    Cervical cancer Neg Hx     Social History: Retired worked a a Production assistant, radio Social History   Socioeconomic History   Marital status: Married    Spouse name: Futures trader of children: 0   Years of education: some college   Highest education level: Not on file  Occupational History   Occupation: CARE MANGEMENT    Employer: [employer]  Tobacco Use   Smoking status: Never   Smokeless tobacco: Never  Vaping Use   Vaping Use: Never used  Substance and Sexual  Activity   Alcohol use: No    Alcohol/week: 0.0 standard drinks   Drug use: No   Sexual activity: Not Currently    Birth control/protection: Surgical  Other Topics Concern   Not on file  Social History Narrative   Not on file   Social Determinants of Health   Financial Resource Strain: Not on file  Food Insecurity: Not on file  Transportation Needs: Not on file  Physical Activity: Not on file  Stress: Not on file  Social Connections: Not on file  Intimate Partner Violence: Not on file    Allergies: Allergies  Allergen Reactions   Sulfa Antibiotics Hives    Current Medications: Current Outpatient Medications  Medication Sig Dispense Refill   acetaminophen (TYLENOL) 500 MG tablet Take 500 mg by mouth every 6 (six) hours as needed (for pain.).      artificial tears (LACRILUBE) OINT ophthalmic ointment Place into the left eye at bedtime as needed for dry eyes. 7 g 1   aspirin EC 81 MG tablet Take 1 tablet (81 mg total) by mouth daily. Swallow whole. 90 tablet 0   Blood Glucose Monitoring Suppl (FREESTYLE LITE) DEVI      dapagliflozin propanediol (FARXIGA) 10 MG TABS tablet Take 1 tablet (10 mg total) by mouth daily. 90 tablet 3   ferrous sulfate 325 (65 FE) MG tablet TAKE 1 TABLET BY MOUTH TWICE DAILY 60 tablet 2   FreeStyle Unistick II Lancets MISC Use as instructed to check blood glucose daily 100 each 5   glucose blood (FREESTYLE TEST STRIPS) test strip Use as instructed to check blood glucose daily 100 each 12   glucose monitoring kit (FREESTYLE) monitoring kit 1 each by Does not apply route as needed for other. 1 each 0   Lancets (FREESTYLE) lancets      lisinopril (ZESTRIL) 5 MG tablet TAKE 1 TABLET (5 MG TOTAL) BY MOUTH DAILY. 90 tablet 3   loratadine (CLARITIN) 10 MG tablet Take 1 tablet (10 mg total) by mouth daily. 90 tablet 3   Magnesium 400 MG TABS Take 1 tablet by mouth daily.     metFORMIN (GLUCOPHAGE) 1000 MG tablet TAKE 1 TABLET BY MOUTH TWICE DAILY WITH A  MEAL. 180 tablet 3   Multiple Vitamin (MULTIVITAMIN WITH MINERALS) TABS tablet Take 1 tablet by mouth 2 (two) times a week.     nortriptyline (PAMELOR) 10 MG capsule Take 1 capsule (10 mg total) by mouth at bedtime. 30 capsule 2   omeprazole (PRILOSEC) 20 MG capsule Take 1 capsule (20 mg total) by mouth daily. 90 capsule 3   rosuvastatin (CRESTOR) 5 MG tablet Take 1 tablet (5 mg total) by mouth daily. 90 tablet 3   vitamin B-12 (  CYANOCOBALAMIN) 1000 MCG tablet Take 1 tablet (1,000 mcg total) by mouth daily. (Patient taking differently: Take 1,000 mcg by mouth every evening.) 90 tablet 1   No current facility-administered medications for this visit.    Review of Systems  General: fatigue  HEENT: no complaints  Lungs: no complaints  Cardiac: no complaints  GI: no complaints  GU: no complaints  Musculoskeletal: no complaints  Extremities: no complaints  Skin: no complaints  Neuro: no complaints  Endocrine: no complaints  Psych: no complaints         Objective:  Physical Examination:  BP 122/65   Pulse 88   Temp 97.8 F (36.6 C)   Resp 18   Wt 216 lb 1.6 oz (98 kg)   SpO2 98%   BMI 37.09 kg/m    ECOG Performance Status: 0 - Asymptomatic  GENERAL: Patient is a well appearing female in no acute distress HEENT:  PERRL, neck supple with midline trachea.  NODES:  No cervical, supraclavicular, axillary, or inguinal lymphadenopathy palpated.  LUNGS:  Clear to auscultation bilaterally.  No wheezes or rhonchi. HEART:  Regular rate and rhythm. No murmur appreciated. ABDOMEN:  Soft, nontender.  No ascites or masses. All incisions well healed and no evidence of hernias.  MSK:  No focal spinal tenderness to palpation. Full range of motion bilaterally in the upper extremities. EXTREMITIES:  No peripheral edema.   SKIN:  Clear with no obvious rashes or skin changes. No nail dyscrasia. NEURO:  Nonfocal. Well oriented.  Appropriate affect.  Pelvic: EGBUS: no lesions Cervix:  absent Vagina: no lesions, no discharge or bleeding Uterus: absent Adnexa: no palpable masses Rectovaginal: confirmatory   Lab Review N/A   Chemistry      Component Value Date/Time   NA 137 01/14/2021 1525   NA 136 10/04/2020 1306   K 4.3 01/14/2021 1525   CL 107 01/14/2021 1525   CO2 22 01/14/2021 1525   BUN 20 01/14/2021 1525   BUN 17 10/04/2020 1306   CREATININE 1.22 (H) 01/14/2021 1525   CREATININE 0.81 01/15/2017 1026      Component Value Date/Time   CALCIUM 9.3 01/14/2021 1525   ALKPHOS 103 01/14/2021 1525   AST 53 (H) 01/14/2021 1525   ALT 28 01/14/2021 1525   BILITOT 1.3 (H) 01/14/2021 1525   BILITOT 1.1 10/04/2020 1306     Lab Results  Component Value Date   WBC 3.8 (L) 01/14/2021   HGB 12.3 01/14/2021   HCT 36.3 01/14/2021   MCV 86.8 01/14/2021   PLT 87 (L) 01/14/2021     Radiologic Imaging: As per HPI    Assessment:  Cindy Trujillo is a 65 y.o. 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   Pancytopenia  Elevated AST otherwise normal LFTs  Medical co-morbidities complicating care:  DVT on anti-coagulation, GERD, diabetes, prior abdominal surgery and immunocompromised. Body mass index is 37.09 kg/m.  Plan:   Problem List Items Addressed This Visit       Genitourinary   Endometrial cancer ([place]) - Primary    I have recommended continued close follow up with exams, including pelvic exams 6 months for 5 years and then annually thereafter.  We previously reviewed the following: Imaging and laboratory assessment is based on clinical indication. Patient education for obesity, lifestyle, exercise, nutrition, weight loss.   We  will begin alternating with Dr. Gilman Schmidt after her next appointment. She will contact Dr. Gennette Pac office for  the appointment.   Recommended continued follow up with Dr. Tasia Catchings.   The patient's diagnosis, an outline of the further diagnostic and laboratory studies which will be required, the recommendation, and alternatives were discussed.  All questions were answered to the patient's satisfaction.  Beckey Rutter, NP  I personally had a face to face interaction and evaluated the patient jointly with the NP, Ms. Beckey Rutter.  I have reviewed her history and available records and have performed the key portions of the physical exam including  abdominal exam, pelvic exam with my findings confirming those documented above by the APP.  I have discussed the case with the APP and the patient.  I agree with the above documentation, assessment and plan which was fully formulated by me.  Counseling was completed by me.   I personally saw the patient and performed a substantive portion of this encounter in conjunction with the listed APP as documented above.   [place] Gaetana Michaelis, MD

## 2021-02-26 NOTE — Patient Instructions (Addendum)
Call and make appointment to see Dr. Gilman Schmidt in 6 months.   Cramps Start MgO 400 mg po every day for 5 days then one po twice/day.   You may still have some iron deficiency. Ensure you are taking your iron supplement daily and contact Dr Tasia Catchings to have your iron levels rechecked.   Other options: tonic water

## 2021-03-17 ENCOUNTER — Other Ambulatory Visit: Payer: Self-pay

## 2021-03-18 DIAGNOSIS — E119 Type 2 diabetes mellitus without complications: Secondary | ICD-10-CM | POA: Diagnosis not present

## 2021-03-18 DIAGNOSIS — H5213 Myopia, bilateral: Secondary | ICD-10-CM | POA: Diagnosis not present

## 2021-03-18 LAB — HM DIABETES EYE EXAM

## 2021-03-25 ENCOUNTER — Telehealth: Payer: Self-pay

## 2021-03-25 NOTE — Telephone Encounter (Signed)
Copied from [place] [id number]. Topic: Appointment Scheduling - Scheduling Inquiry for Clinic >> Mar 25, 2021  1:21 PM Erick Blinks wrote: Reason for CRM: Pt wants to have her Iron checked and platelets as well (during appt Friday)

## 2021-03-25 NOTE — Telephone Encounter (Signed)
noted 

## 2021-03-28 ENCOUNTER — Encounter: Payer: Self-pay | Admitting: Family Medicine

## 2021-03-28 ENCOUNTER — Ambulatory Visit: Payer: 59 | Admitting: Family Medicine

## 2021-03-28 ENCOUNTER — Other Ambulatory Visit: Payer: Self-pay

## 2021-03-28 VITALS — BP 115/62 | HR 78 | Temp 97.8°F | Resp 16 | Ht 64.0 in | Wt 214.9 lb

## 2021-03-28 DIAGNOSIS — E1169 Type 2 diabetes mellitus with other specified complication: Secondary | ICD-10-CM

## 2021-03-28 DIAGNOSIS — R809 Proteinuria, unspecified: Secondary | ICD-10-CM

## 2021-03-28 DIAGNOSIS — E785 Hyperlipidemia, unspecified: Secondary | ICD-10-CM | POA: Diagnosis not present

## 2021-03-28 DIAGNOSIS — D696 Thrombocytopenia, unspecified: Secondary | ICD-10-CM

## 2021-03-28 DIAGNOSIS — D509 Iron deficiency anemia, unspecified: Secondary | ICD-10-CM

## 2021-03-28 DIAGNOSIS — E1129 Type 2 diabetes mellitus with other diabetic kidney complication: Secondary | ICD-10-CM | POA: Diagnosis not present

## 2021-03-28 LAB — POCT GLYCOSYLATED HEMOGLOBIN (HGB A1C)
Est. average glucose Bld gHb Est-mCnc: 171
Hemoglobin A1C: 7.6 % — AB (ref 4.0–5.6)

## 2021-03-28 MED ORDER — METFORMIN HCL 1000 MG PO TABS
ORAL_TABLET | Freq: Two times a day (BID) | ORAL | 3 refills | Status: DC
  Filled 2021-03-28 – 2021-06-01 (×2): qty 180, 90d supply, fill #0
  Filled 2021-09-27: qty 180, 90d supply, fill #1
  Filled 2021-12-29: qty 180, 90d supply, fill #2

## 2021-03-28 MED ORDER — DAPAGLIFLOZIN PROPANEDIOL 10 MG PO TABS
10.0000 mg | ORAL_TABLET | Freq: Every day | ORAL | 3 refills | Status: DC
  Filled 2021-03-28 – 2021-06-01 (×2): qty 90, 90d supply, fill #0
  Filled 2021-09-27: qty 90, 90d supply, fill #1
  Filled 2021-12-29: qty 90, 90d supply, fill #2

## 2021-03-28 MED ORDER — LORATADINE 10 MG PO TABS
10.0000 mg | ORAL_TABLET | Freq: Every day | ORAL | 3 refills | Status: DC
  Filled 2021-03-28: qty 90, 90d supply, fill #0

## 2021-03-28 MED ORDER — NORTRIPTYLINE HCL 10 MG PO CAPS
10.0000 mg | ORAL_CAPSULE | Freq: Every day | ORAL | 3 refills | Status: DC
  Filled 2021-03-28: qty 90, 90d supply, fill #0

## 2021-03-28 MED ORDER — LISINOPRIL 5 MG PO TABS
ORAL_TABLET | Freq: Every day | ORAL | 3 refills | Status: DC
Start: 2021-03-28 — End: 2022-04-02
  Filled 2021-03-28: qty 90, 90d supply, fill #0

## 2021-03-28 MED ORDER — OMEPRAZOLE 20 MG PO CPDR
20.0000 mg | DELAYED_RELEASE_CAPSULE | Freq: Every day | ORAL | 3 refills | Status: DC
  Filled 2021-03-28 – 2021-09-27 (×3): qty 90, 90d supply, fill #0
  Filled 2021-12-29: qty 90, 90d supply, fill #1

## 2021-03-28 MED ORDER — ROSUVASTATIN CALCIUM 5 MG PO TABS
5.0000 mg | ORAL_TABLET | Freq: Every day | ORAL | 3 refills | Status: DC
  Filled 2021-03-28 – 2021-06-01 (×2): qty 90, 90d supply, fill #0
  Filled 2021-09-27: qty 90, 90d supply, fill #1
  Filled 2021-12-29: qty 90, 90d supply, fill #2

## 2021-03-28 NOTE — Assessment & Plan Note (Signed)
Continue low dose lisinopril Recheck urine microalbumin to creatinine ratio

## 2021-03-28 NOTE — Progress Notes (Signed)
Established patient visit   Patient: Cindy Trujillo   DOB: March 06, 1956   65 y.o. Female  MRN: [id number] Visit Date: 03/28/2021  Today's healthcare provider: Lavon Paganini, MD   Chief Complaint  Patient presents with   Diabetes   Hyperlipidemia   I,Sulibeya S Dimas,acting as a scribe for Lavon Paganini, MD.,have documented all relevant documentation on the behalf of Lavon Paganini, MD,as directed by  Lavon Paganini, MD while in the presence of Lavon Paganini, MD.  Subjective    HPI  Diabetes Mellitus Type II, Follow-up  Lab Results  Component Value Date   HGBA1C 7.6 (A) 03/28/2021   HGBA1C 7.8 (H) 10/04/2020   HGBA1C 6.9 (A) 03/28/2020   Wt Readings from Last 3 Encounters:  03/28/21 214 lb 14.4 oz (97.5 kg)  02/26/21 216 lb 1.6 oz (98 kg)  01/24/21 215 lb (97.5 kg)   Last seen for diabetes 6 months ago.  Management since then includes no changes. She reports excellent compliance with treatment. She is not having side effects.  Symptoms: Yes fatigue No foot ulcerations  No appetite changes No nausea  No paresthesia of the feet  No polydipsia  No polyuria No visual disturbances   Yes vomiting     Home blood sugar records: fasting range: 120-150s  Episodes of hypoglycemia? No    Current insulin regiment: none Most Recent Eye Exam: UTD Current exercise: none Current diet habits: in general, a "healthy" diet    Pertinent Labs: Lab Results  Component Value Date   CHOL 146 10/04/2020   HDL 50 10/04/2020   LDLCALC 62 10/04/2020   TRIG 211 (H) 10/04/2020   CHOLHDL 2.9 10/04/2020   Lab Results  Component Value Date   NA 137 01/14/2021   K 4.3 01/14/2021   CREATININE 1.22 (H) 01/14/2021   GFRNONAA 50 (L) 01/14/2021   MICROALBUR 50 01/15/2017     --------------------------------------------------------------------------------------------------- Lipid/Cholesterol, Follow-up  Last lipid panel Other pertinent labs  Lab Results   Component Value Date   CHOL 146 10/04/2020   HDL 50 10/04/2020   LDLCALC 62 10/04/2020   TRIG 211 (H) 10/04/2020   CHOLHDL 2.9 10/04/2020   Lab Results  Component Value Date   ALT 28 01/14/2021   AST 53 (H) 01/14/2021   PLT 87 (L) 01/14/2021   TSH 2.830 10/04/2020     She was last seen for this 6 months ago.  Management since that visit includes no changes.  She reports excellent compliance with treatment. She is not having side effects.   Symptoms: No chest pain No chest pressure/discomfort  No dyspnea No lower extremity edema  No numbness or tingling of extremity No orthopnea  No palpitations No paroxysmal nocturnal dyspnea  No speech difficulty No syncope   Current diet: in general, a "healthy" diet   Current exercise: none  The 10-year ASCVD risk score (Arnett DK, et al., 2019) is: 6%  ---------------------------------------------------------------------------------------------------    Medications: Outpatient Medications Prior to Visit  Medication Sig   acetaminophen (TYLENOL) 500 MG tablet Take 500 mg by mouth every 6 (six) hours as needed (for pain.).    artificial tears (LACRILUBE) OINT ophthalmic ointment Place into the left eye at bedtime as needed for dry eyes.   aspirin EC 81 MG tablet Take 1 tablet (81 mg total) by mouth daily. Swallow whole.   Blood Glucose Monitoring Suppl (FREESTYLE LITE) DEVI    ferrous sulfate 325 (65 FE) MG tablet TAKE 1 TABLET BY MOUTH TWICE DAILY  FreeStyle Unistick II Lancets MISC Use as instructed to check blood glucose daily   glucose blood (FREESTYLE TEST STRIPS) test strip Use as instructed to check blood glucose daily   glucose monitoring kit (FREESTYLE) monitoring kit 1 each by Does not apply route as needed for other.   Lancets (FREESTYLE) lancets    Magnesium 400 MG TABS Take 1 tablet by mouth daily.   Multiple Vitamin (MULTIVITAMIN WITH MINERALS) TABS tablet Take 1 tablet by mouth 2 (two) times a week.   vitamin B-12  (CYANOCOBALAMIN) 1000 MCG tablet Take 1 tablet (1,000 mcg total) by mouth daily. (Patient taking differently: Take 1,000 mcg by mouth every evening.)   [DISCONTINUED] dapagliflozin propanediol (FARXIGA) 10 MG TABS tablet Take 1 tablet (10 mg total) by mouth daily.   [DISCONTINUED] lisinopril (ZESTRIL) 5 MG tablet TAKE 1 TABLET (5 MG TOTAL) BY MOUTH DAILY.   [DISCONTINUED] loratadine (CLARITIN) 10 MG tablet Take 1 tablet (10 mg total) by mouth daily.   [DISCONTINUED] metFORMIN (GLUCOPHAGE) 1000 MG tablet TAKE 1 TABLET BY MOUTH TWICE DAILY WITH A MEAL.   [DISCONTINUED] nortriptyline (PAMELOR) 10 MG capsule Take 1 capsule (10 mg total) by mouth at bedtime.   [DISCONTINUED] omeprazole (PRILOSEC) 20 MG capsule Take 1 capsule (20 mg total) by mouth daily.   [DISCONTINUED] rosuvastatin (CRESTOR) 5 MG tablet Take 1 tablet (5 mg total) by mouth daily.   No facility-administered medications prior to visit.    Review of Systems  Constitutional:  Positive for fatigue. Negative for appetite change.  Respiratory:  Negative for chest tightness and shortness of breath.   Cardiovascular:  Negative for chest pain and palpitations.      Objective    BP 115/62 (BP Location: Right Arm, Patient Position: Sitting, Cuff Size: Large)    Pulse 78    Temp 97.8 F (36.6 C) (Temporal)    Resp 16    Ht _0  (1.626 m)    Wt 214 lb 14.4 oz (97.5 kg)    SpO2 98%    BMI 36.89 kg/m  {Show previous vital signs (optional):23777}  Physical Exam Vitals reviewed.  Constitutional:      General: She is not in acute distress.    Appearance: Normal appearance. She is well-developed. She is not diaphoretic.  HENT:     Head: Normocephalic and atraumatic.  Eyes:     General: No scleral icterus.    Conjunctiva/sclera: Conjunctivae normal.  Neck:     Thyroid: No thyromegaly.  Cardiovascular:     Rate and Rhythm: Normal rate and regular rhythm.     Pulses: Normal pulses.     Heart sounds: Normal heart sounds. No murmur  heard. Pulmonary:     Effort: Pulmonary effort is normal. No respiratory distress.     Breath sounds: Normal breath sounds. No wheezing, rhonchi or rales.  Musculoskeletal:     Cervical back: Neck supple.     Right lower leg: Edema (trace) present.     Left lower leg: Edema (trace) present.  Lymphadenopathy:     Cervical: No cervical adenopathy.  Skin:    General: Skin is warm and dry.     Findings: No rash.  Neurological:     Mental Status: She is alert and oriented to person, place, and time. Mental status is at baseline.  Psychiatric:        Mood and Affect: Mood normal.        Behavior: Behavior normal.      Results for orders placed or  performed in visit on 03/28/21  POCT HgB A1C  Result Value Ref Range   Hemoglobin A1C 7.6 (A) 4.0 - 5.6 %   Est. average glucose Bld gHb Est-mCnc 171     Assessment & Plan     Problem List Items Addressed This Visit       Endocrine   T2DM (type 2 diabetes mellitus) ([place]) - Primary    Chronic and uncontrolled with hyperglycemia Patient states she has had drastic change in her diet and feels like she can decrease her carb intake and get this back under control Declines any changes in medications today, so we will continue [place] and metformin at current dose Up-to-date on screenings-we will send off for her last eye exam On statin and ACE inhibitor Follow-up in 3 months and repeat A1c      Relevant Medications   dapagliflozin propanediol (FARXIGA) 10 MG TABS tablet   lisinopril (ZESTRIL) 5 MG tablet   metFORMIN (GLUCOPHAGE) 1000 MG tablet   rosuvastatin (CRESTOR) 5 MG tablet   Other Relevant Orders   POCT HgB A1C (Completed)   Hyperlipidemia associated with type 2 diabetes mellitus ([place])    Did not tolerate lipitor due to myalgias Continue crestor - tolerating well Reviewed last lipid panel - repeat today      Relevant Medications   dapagliflozin propanediol (FARXIGA) 10 MG TABS tablet   lisinopril (ZESTRIL) 5 MG tablet    metFORMIN (GLUCOPHAGE) 1000 MG tablet   rosuvastatin (CRESTOR) 5 MG tablet   Other Relevant Orders   Comprehensive metabolic panel   Lipid panel   Microalbuminuria due to type 2 diabetes mellitus (HCC)    Continue low dose lisinopril Recheck urine microalbumin to creatinine ratio      Relevant Medications   dapagliflozin propanediol (FARXIGA) 10 MG TABS tablet   lisinopril (ZESTRIL) 5 MG tablet   metFORMIN (GLUCOPHAGE) 1000 MG tablet   rosuvastatin (CRESTOR) 5 MG tablet   Other Relevant Orders   Urine Microalbumin w/creat. ratio     Hematopoietic and Hemostatic   Thrombocytopenia (HCC)    Followed by hematology Recheck CBC today      Relevant Orders   CBC   B12   VITAMIN D 25 Hydroxy (Vit-D Deficiency, Fractures)     Other   Morbid obesity (HCC)    BMI greater than 35 and associated with T2DM and HLD Discussed importance of healthy weight management Discussed diet and exercise       Relevant Medications   dapagliflozin propanediol (FARXIGA) 10 MG TABS tablet   metFORMIN (GLUCOPHAGE) 1000 MG tablet   Other Relevant Orders   B12   VITAMIN D 25 Hydroxy (Vit-D Deficiency, Fractures)   IDA (iron deficiency anemia)   Relevant Orders   CBC   Iron, TIBC and Ferritin Panel   B12   VITAMIN D 25 Hydroxy (Vit-D Deficiency, Fractures)     Return in about 3 months (around 06/26/2021) for chronic disease f/u.      I, Lavon Paganini, MD, have reviewed all documentation for this visit. The documentation on 03/28/21 for the exam, diagnosis, procedures, and orders are all accurate and complete.   Krishna Heuer, Dionne Bucy, MD, MPH [facility]

## 2021-03-28 NOTE — Assessment & Plan Note (Signed)
Did not tolerate lipitor due to myalgias Continue crestor - tolerating well Reviewed last lipid panel - repeat today

## 2021-03-28 NOTE — Assessment & Plan Note (Signed)
Followed by hematology Recheck CBC today

## 2021-03-28 NOTE — Assessment & Plan Note (Signed)
Chronic and uncontrolled with hyperglycemia Patient states she has had drastic change in her diet and feels like she can decrease her carb intake and get this back under control Declines any changes in medications today, so we will continue [place] and metformin at current dose Up-to-date on screenings-we will send off for her last eye exam On statin and ACE inhibitor Follow-up in 3 months and repeat A1c

## 2021-03-28 NOTE — Assessment & Plan Note (Signed)
BMI greater than 35 and associated with T2DM and HLD Discussed importance of healthy weight management Discussed diet and exercise

## 2021-04-09 DIAGNOSIS — E1169 Type 2 diabetes mellitus with other specified complication: Secondary | ICD-10-CM | POA: Diagnosis not present

## 2021-04-09 DIAGNOSIS — D696 Thrombocytopenia, unspecified: Secondary | ICD-10-CM | POA: Diagnosis not present

## 2021-04-09 DIAGNOSIS — E1129 Type 2 diabetes mellitus with other diabetic kidney complication: Secondary | ICD-10-CM | POA: Diagnosis not present

## 2021-04-09 DIAGNOSIS — R809 Proteinuria, unspecified: Secondary | ICD-10-CM | POA: Diagnosis not present

## 2021-04-09 DIAGNOSIS — D509 Iron deficiency anemia, unspecified: Secondary | ICD-10-CM | POA: Diagnosis not present

## 2021-04-09 DIAGNOSIS — E785 Hyperlipidemia, unspecified: Secondary | ICD-10-CM | POA: Diagnosis not present

## 2021-04-10 ENCOUNTER — Encounter: Payer: Self-pay | Admitting: Family Medicine

## 2021-04-10 LAB — IRON,TIBC AND FERRITIN PANEL
Ferritin: 49 ng/mL (ref 15–150)
Iron Saturation: 15 % (ref 15–55)
Iron: 46 ug/dL (ref 27–139)
Total Iron Binding Capacity: 317 ug/dL (ref 250–450)
UIBC: 271 ug/dL (ref 118–369)

## 2021-04-10 LAB — COMPREHENSIVE METABOLIC PANEL
ALT: 22 IU/L (ref 0–32)
AST: 41 IU/L — ABNORMAL HIGH (ref 0–40)
Albumin/Globulin Ratio: 1.4 (ref 1.2–2.2)
Albumin: 4.1 g/dL (ref 3.8–4.8)
Alkaline Phosphatase: 152 IU/L — ABNORMAL HIGH (ref 44–121)
BUN/Creatinine Ratio: 17 (ref 12–28)
BUN: 17 mg/dL (ref 8–27)
Bilirubin Total: 0.6 mg/dL (ref 0.0–1.2)
CO2: 19 mmol/L — ABNORMAL LOW (ref 20–29)
Calcium: 9.3 mg/dL (ref 8.7–10.3)
Chloride: 103 mmol/L (ref 96–106)
Creatinine, Ser: 1.01 mg/dL — ABNORMAL HIGH (ref 0.57–1.00)
Globulin, Total: 2.9 g/dL (ref 1.5–4.5)
Glucose: 165 mg/dL — ABNORMAL HIGH (ref 70–99)
Potassium: 4.4 mmol/L (ref 3.5–5.2)
Sodium: 138 mmol/L (ref 134–144)
Total Protein: 7 g/dL (ref 6.0–8.5)
eGFR: 63 mL/min/{1.73_m2} (ref >59)

## 2021-04-10 LAB — LIPID PANEL
Chol/HDL Ratio: 3 ratio (ref 0.0–4.4)
Cholesterol, Total: 139 mg/dL (ref 100–199)
HDL: 47 mg/dL (ref >39)
LDL Chol Calc (NIH): 46 mg/dL (ref 0–99)
Triglycerides: 306 mg/dL — ABNORMAL HIGH (ref 0–149)
VLDL Cholesterol Cal: 46 mg/dL — ABNORMAL HIGH (ref 5–40)

## 2021-04-10 LAB — VITAMIN B12: Vitamin B-12: 1583 pg/mL — ABNORMAL HIGH (ref 232–1245)

## 2021-04-10 LAB — CBC
Hematocrit: 35.5 % (ref 34.0–46.6)
Hemoglobin: 12 g/dL (ref 11.1–15.9)
MCH: 28.4 pg (ref 26.6–33.0)
MCHC: 33.8 g/dL (ref 31.5–35.7)
MCV: 84 fL (ref 79–97)
Platelets: 86 10*3/uL — CL (ref 150–450)
RBC: 4.23 x10E6/uL (ref 3.77–5.28)
RDW: 13.5 % (ref 11.7–15.4)
WBC: 4.1 10*3/uL (ref 3.4–10.8)

## 2021-04-10 LAB — MICROALBUMIN / CREATININE URINE RATIO
Creatinine, Urine: 71.5 mg/dL
Microalb/Creat Ratio: 8 mg/g creat (ref 0–29)
Microalbumin, Urine: 6 ug/mL

## 2021-04-10 LAB — VITAMIN D 25 HYDROXY (VIT D DEFICIENCY, FRACTURES): Vit D, 25-Hydroxy: 11.7 ng/mL — ABNORMAL LOW (ref 30.0–100.0)

## 2021-04-14 ENCOUNTER — Other Ambulatory Visit: Payer: Self-pay

## 2021-04-16 ENCOUNTER — Encounter: Payer: Self-pay | Admitting: Family Medicine

## 2021-04-23 ENCOUNTER — Telehealth: Payer: Self-pay | Admitting: Oncology

## 2021-04-23 NOTE — Telephone Encounter (Signed)
Pt called to reschedule her appt for 2-7. Call back at [phone]

## 2021-05-20 ENCOUNTER — Ambulatory Visit: Payer: 59 | Admitting: Oncology

## 2021-05-20 ENCOUNTER — Other Ambulatory Visit: Payer: 59

## 2021-06-02 ENCOUNTER — Other Ambulatory Visit: Payer: Self-pay

## 2021-06-02 ENCOUNTER — Inpatient Hospital Stay: Payer: 59 | Attending: Oncology

## 2021-06-02 ENCOUNTER — Encounter: Payer: Self-pay | Admitting: Oncology

## 2021-06-02 ENCOUNTER — Inpatient Hospital Stay: Payer: 59 | Admitting: Oncology

## 2021-06-02 VITALS — BP 112/66 | HR 77 | Temp 97.0°F | Resp 18 | Wt 215.0 lb

## 2021-06-02 DIAGNOSIS — Z86718 Personal history of other venous thrombosis and embolism: Secondary | ICD-10-CM | POA: Diagnosis not present

## 2021-06-02 DIAGNOSIS — D6851 Activated protein C resistance: Secondary | ICD-10-CM | POA: Diagnosis not present

## 2021-06-02 DIAGNOSIS — C541 Malignant neoplasm of endometrium: Secondary | ICD-10-CM | POA: Insufficient documentation

## 2021-06-02 DIAGNOSIS — Z79899 Other long term (current) drug therapy: Secondary | ICD-10-CM | POA: Insufficient documentation

## 2021-06-02 DIAGNOSIS — K760 Fatty (change of) liver, not elsewhere classified: Secondary | ICD-10-CM | POA: Diagnosis not present

## 2021-06-02 DIAGNOSIS — D696 Thrombocytopenia, unspecified: Secondary | ICD-10-CM

## 2021-06-02 DIAGNOSIS — R935 Abnormal findings on diagnostic imaging of other abdominal regions, including retroperitoneum: Secondary | ICD-10-CM | POA: Diagnosis not present

## 2021-06-02 DIAGNOSIS — Z7982 Long term (current) use of aspirin: Secondary | ICD-10-CM | POA: Insufficient documentation

## 2021-06-02 LAB — COMPREHENSIVE METABOLIC PANEL
ALT: 24 U/L (ref 0–44)
AST: 41 U/L (ref 15–41)
Albumin: 4 g/dL (ref 3.5–5.0)
Alkaline Phosphatase: 121 U/L (ref 38–126)
Anion gap: 9 (ref 5–15)
BUN: 21 mg/dL (ref 8–23)
CO2: 20 mmol/L — ABNORMAL LOW (ref 22–32)
Calcium: 9.3 mg/dL (ref 8.9–10.3)
Chloride: 106 mmol/L (ref 98–111)
Creatinine, Ser: 1.01 mg/dL — ABNORMAL HIGH (ref 0.44–1.00)
GFR, Estimated: >60 mL/min (ref >60)
Glucose, Bld: 158 mg/dL — ABNORMAL HIGH (ref 70–99)
Potassium: 4.6 mmol/L (ref 3.5–5.1)
Sodium: 135 mmol/L (ref 135–145)
Total Bilirubin: 0.8 mg/dL (ref 0.3–1.2)
Total Protein: 7.8 g/dL (ref 6.5–8.1)

## 2021-06-02 LAB — CBC WITH DIFFERENTIAL/PLATELET
Abs Immature Granulocytes: 0 10*3/uL (ref 0.00–0.07)
Basophils Absolute: 0 10*3/uL (ref 0.0–0.1)
Basophils Relative: 1 %
Eosinophils Absolute: 0.1 10*3/uL (ref 0.0–0.5)
Eosinophils Relative: 3 %
HCT: 41.6 % (ref 36.0–46.0)
Hemoglobin: 13.7 g/dL (ref 12.0–15.0)
Immature Granulocytes: 0 %
Lymphocytes Relative: 33 %
Lymphs Abs: 1.4 10*3/uL (ref 0.7–4.0)
MCH: 28.5 pg (ref 26.0–34.0)
MCHC: 32.9 g/dL (ref 30.0–36.0)
MCV: 86.5 fL (ref 80.0–100.0)
Monocytes Absolute: 0.3 10*3/uL (ref 0.1–1.0)
Monocytes Relative: 7 %
Neutro Abs: 2.5 10*3/uL (ref 1.7–7.7)
Neutrophils Relative %: 56 %
Platelets: 78 10*3/uL — ABNORMAL LOW (ref 150–400)
RBC: 4.81 MIL/uL (ref 3.87–5.11)
RDW: 13.9 % (ref 11.5–15.5)
WBC: 4.3 10*3/uL (ref 4.0–10.5)
nRBC: 0 % (ref 0.0–0.2)

## 2021-06-02 LAB — IMMATURE PLATELET FRACTION: Immature Platelet Fraction: 2.1 % (ref 1.2–8.6)

## 2021-06-02 NOTE — Progress Notes (Signed)
Patient here for follow up. 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 She also reports increased tiredness after workdays.

## 2021-06-02 NOTE — Progress Notes (Signed)
Hematology/Oncology Progress note [phone] [phone]      Patient Care Team: Cindy Crews, Trujillo as PCP - General (Family Medicine) Cindy Jacks, RN as Oncology Nurse Navigator Cindy Trujillo as Referring Physician (Obstetrics) Cindy Server, Trujillo as Consulting Physician (Oncology)  REFERRING PROVIDER: Virginia Crews, Trujillo  CHIEF COMPLAINTS/REASON FOR VISIT:  thrombocytopenia  HISTORY OF PRESENTING ILLNESS:   Cindy Trujillo is a  66 y.o.  female with PMH listed below was seen in consultation at the request of  Cindy Trujillo Cindy Bucy, Trujillo  for evaluation of,  Thrombophilia work-up.    Patient was previously seen by me for thrombocytopenia.  She was last seen by me on 03/21/2019.  She lost follow-up. Patient was recently evaluated by gynecology for postmenopausal bleeding. Patient has had pelvic ultrasound and also 10/31/2019 she underwent D&C with MyoSure. Pathology showed endometrial adenocarcinoma, endometrioid type, FIGO grade 1.  Background EIN/atypical hyperplasia. Patient was seen by Cindy Trujillo and recommend surgical treatment with hysterectomy and bilateral salpingoophorectomy with pelvic washing, pelvic para-aortic lymph node sampling and sentinel lymph node biopsy. Cindy Trujillo recommends the patient reestablish care with me for thrombophilia work-up.  Patient is at risk of developing thrombosis perioperatively given the type of procedure, history of DVT, malignancy.   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  She has been on Eliquis 2.5 mg twice daily.  She was advised by primary care provider to cut 5 mg tablets to half for 2.5 mg twice daily.   Managed by primary care provider.  DVT was found incidentally to her procedure for varicose vein.  For her chronic thrombocytopenia, her initial work-up was nonremarkable except borderline vitamin B12 level.  She was recommended to observe blood counts and  if further worsening of thrombocytopenia, I recommend bone marrow biopsy.  She lost follow-up since December 2020. Ultrasound liver showed coarse hepatic echo Texture suggesting fatty liver disease.  12/06/2019 underwent  hysterectomy with oophorectomy.  Pathology showed endometrioid endometiral adenocarcinoma, low grade, Figo 1, invasive 19m/16mm of total myometrial thickness. No lymph node involvement. pT1a pN0.   # COVID-19 infection in February 2022.   #11/01/2020 bone marrow biopsy results were reviewed and discussed with patient.  She has normocellular marrow with trilineage hematopoiesis.  Minimal erythroid atypia and megakaryocyte clustering.  A low-grade myelodysplastic syndrome with minimal cytologic atypia may be considered. Cytogenetics is normal. MDS FISH panel was normal.  INTERVAL HISTORY Cindy Trujillo is a 66y.o. female who has above history reviewed by me today presents for follow up visit for history of DVT, factor V Leiden heterozygous mutation, thrombocytopenia and history of endometrial cancer Abdomen ultrasound was ordered and was not done No new complaints.  Review of Systems  Constitutional:  Negative for appetite change, chills, fatigue and fever.  HENT:   Negative for hearing loss and voice change.   Eyes:  Negative for eye problems.  Respiratory:  Negative for chest tightness and cough.   Cardiovascular:  Negative for chest pain.  Gastrointestinal:  Negative for abdominal distention, abdominal pain and blood in stool.  Endocrine: Negative for hot flashes.  Genitourinary:  Negative for difficulty urinating and frequency.   Musculoskeletal:  Negative for arthralgias.  Skin:  Negative for itching and rash.  Neurological:  Negative for extremity weakness.  Hematological:  Negative for adenopathy. Bruises/bleeds easily.  Psychiatric/Behavioral:  Negative for confusion.    MEDICAL HISTORY:  Past Medical History:  Diagnosis Date  Anemia    Bell's palsy     Diabetes mellitus without complication (HCC)    DVT (deep venous thrombosis) ([place]) 08/2018   right leg   GERD (gastroesophageal reflux disease)    Hyperlipidemia     SURGICAL HISTORY: Past Surgical History:  Procedure Laterality Date   COLONOSCOPY WITH PROPOFOL N/A 08/23/2017   Procedure: COLONOSCOPY WITH PROPOFOL;  Surgeon: Cindy Trujillo;  Location: [facility] ENDOSCOPY;  Service: Endoscopy;  Laterality: N/A;   ENDOVENOUS ABLATION SAPHENOUS VEIN W/ LASER Left 02/29/2020   endovenous laser ablation left greater saphenous vein and stab phlebectomy 10-20 incisions left leg by Cindy Trujillo    HYSTEROSCOPY WITH D & C N/A 10/31/2019   Procedure: DILATATION AND CURETTAGE /HYSTEROSCOPY WITH MYOSURE RESECTION;  Surgeon: Cindy Trujillo;  Location: [facility] ORS;  Service: Gynecology;  Laterality: N/A;   ROBOTIC ASSISTED TOTAL HYSTERECTOMY WITH BILATERAL SALPINGO OOPHERECTOMY N/A 12/06/2019   Procedure: XI ROBOTIC ASSISTED TOTAL HYSTERECTOMY WITH BILATERAL SALPINGO OOPHORECTOMY;  Surgeon: Cindy Ends, Trujillo;  Location: [facility] ORS;  Service: Gynecology;  Laterality: N/A;   SENTINEL NODE BIOPSY N/A 12/06/2019   Procedure: SENTINEL NODE BIOPSY;  Surgeon: Cindy Ends, Trujillo;  Location: [facility] ORS;  Service: Gynecology;  Laterality: N/A;   TONSILLECTOMY     TONSILLECTOMY AND ADENOIDECTOMY  1968   VEIN SURGERY  05/2019   laser    SOCIAL HISTORY: Social History   Socioeconomic History   Marital status: Married    Spouse name: Cindy Trujillo   Number of children: 0   Years of education: some college   Highest education level: Not on file  Occupational History   Occupation: CARE MANGEMENT    Employer: [employer]  Tobacco Use   Smoking status: Never   Smokeless tobacco: Never  Vaping Use   Vaping Use: Never used  Substance and Sexual Activity   Alcohol use: No    Alcohol/week: 0.0 standard drinks   Drug use: No   Sexual activity: Not Currently    Birth  control/protection: Surgical  Other Topics Concern   Not on file  Social History Narrative   Not on file   Social Determinants of Health   Financial Resource Strain: Not on file  Food Insecurity: Not on file  Transportation Needs: Not on file  Physical Activity: Not on file  Stress: Not on file  Social Connections: Not on file  Intimate Partner Violence: Not on file    FAMILY HISTORY: Family History  Problem Relation Age of Onset   COPD Mother        heavy smoker   Pulmonary fibrosis Mother    Fibromyalgia Mother    COPD Father    Melanoma Father    COPD Sister    COPD Brother    COPD Sister    COPD Sister    Healthy Brother    Heart failure Maternal Grandmother    Heart attack Maternal Grandfather    Cancer Paternal Grandmother        unsure of type, had mets in colon   Heart attack Paternal Grandfather    Cancer Paternal Grandfather        unknown   Breast cancer Neg Hx    Cervical cancer Neg Hx     ALLERGIES:  is allergic to sulfa antibiotics.  MEDICATIONS:  Current Outpatient Medications  Medication Sig Dispense Refill   acetaminophen (TYLENOL) 500 MG tablet Take 500 mg by mouth every 6 (six) hours as needed (for pain.).  artificial tears (LACRILUBE) OINT ophthalmic ointment Place into the left eye at bedtime as needed for dry eyes. 7 g 1   aspirin EC 81 MG tablet Take 1 tablet (81 mg total) by mouth daily. Swallow whole. 90 tablet 0   Blood Glucose Monitoring Suppl (FREESTYLE LITE) DEVI      dapagliflozin propanediol (FARXIGA) 10 MG TABS tablet Take 1 tablet (10 mg total) by mouth daily. 90 tablet 3   ferrous sulfate 325 (65 FE) MG tablet TAKE 1 TABLET BY MOUTH TWICE DAILY 60 tablet 2   FreeStyle Unistick II Lancets MISC Use as instructed to check blood glucose daily 100 each 5   glucose blood (FREESTYLE TEST STRIPS) test strip Use as instructed to check blood glucose daily 100 each 12   glucose monitoring kit (FREESTYLE) monitoring kit 1 each by Does  not apply route as needed for other. 1 each 0   Lancets (FREESTYLE) lancets      lisinopril (ZESTRIL) 5 MG tablet TAKE 1 TABLET (5 MG TOTAL) BY MOUTH DAILY. 90 tablet 3   loratadine (CLARITIN) 10 MG tablet Take 1 tablet (10 mg total) by mouth daily. 90 tablet 3   Magnesium 400 MG TABS Take 1 tablet by mouth daily.     metFORMIN (GLUCOPHAGE) 1000 MG tablet TAKE 1 TABLET BY MOUTH TWICE DAILY WITH A MEAL. 180 tablet 3   Multiple Vitamin (MULTIVITAMIN WITH MINERALS) TABS tablet Take 1 tablet by mouth 2 (two) times a week.     omeprazole (PRILOSEC) 20 MG capsule Take 1 capsule (20 mg total) by mouth daily. 90 capsule 3   rosuvastatin (CRESTOR) 5 MG tablet Take 1 tablet (5 mg total) by mouth daily. 90 tablet 3   vitamin B-12 (CYANOCOBALAMIN) 1000 MCG tablet Take 1 tablet (1,000 mcg total) by mouth daily. (Patient taking differently: Take 1,000 mcg by mouth every evening.) 90 tablet 1   No current facility-administered medications for this visit.     PHYSICAL EXAMINATION: ECOG PERFORMANCE STATUS: 0 - Asymptomatic Vitals:   06/02/21 1324  BP: 112/66  Pulse: 77  Resp: 18  Temp: (!) 97 F (36.1 C)   Filed Weights   06/02/21 1324  Weight: 215 lb (97.5 kg)    Physical Exam Constitutional:      General: She is not in acute distress. HENT:     Head: Normocephalic and atraumatic.  Eyes:     General: No scleral icterus.    Pupils: Pupils are equal, round, and reactive to light.  Cardiovascular:     Rate and Rhythm: Normal rate and regular rhythm.     Heart sounds: Normal heart sounds.  Pulmonary:     Effort: Pulmonary effort is normal. No respiratory distress.     Breath sounds: No wheezing.  Abdominal:     General: Bowel sounds are normal. There is no distension.     Palpations: Abdomen is soft. There is no mass.     Tenderness: There is no abdominal tenderness.  Musculoskeletal:        General: No deformity. Normal range of motion.     Cervical back: Normal range of motion and  neck supple.  Skin:    General: Skin is warm and dry.  Neurological:     Mental Status: She is alert and oriented to person, place, and time. Mental status is at baseline.     Cranial Nerves: No cranial nerve deficit.     Coordination: Coordination normal.  Psychiatric:  Mood and Affect: Mood normal.     LABORATORY DATA:  I have reviewed the data as listed Lab Results  Component Value Date   WBC 4.3 06/02/2021   HGB 13.7 06/02/2021   HCT 41.6 06/02/2021   MCV 86.5 06/02/2021   PLT 78 (L) 06/02/2021   Recent Labs    10/02/20 1240 10/04/20 1306 01/14/21 1525 04/09/21 1604 06/02/21 1307  NA 137   < > 137 138 135  K 4.4   < > 4.3 4.4 4.6  CL 105   < > 107 103 106  CO2 24   < > 22 19* 20*  GLUCOSE 157*   < > 153* 165* 158*  BUN 20   < > 20 17 21   CREATININE 1.11*   < > 1.22* 1.01* 1.01*  CALCIUM 9.5   < > 9.3 9.3 9.3  GFRNONAA 56*  --  50*  --  >60  PROT 8.0   < > 7.5 7.0 7.8  ALBUMIN 4.2   < > 4.1 4.1 4.0  AST 49*   < > 53* 41* 41  ALT 28   < > 28 22 24   ALKPHOS 103   < > 103 152* 121  BILITOT 0.9   < > 1.3* 0.6 0.8   < > = values in this interval not displayed.    Iron/TIBC/Ferritin/ %Sat    Component Value Date/Time   IRON 46 04/09/2021 1604   TIBC 317 04/09/2021 1604   FERRITIN 49 04/09/2021 1604   IRONPCTSAT 15 04/09/2021 1604      RADIOGRAPHIC STUDIES: I have personally reviewed the radiological images as listed and agreed with the findings in the report. No results found.    ASSESSMENT & PLAN:  1. Thrombocytopenia ([place])   2. Endometrial cancer ([place])   3. Fatty liver disease, nonalcoholic   4. History of DVT (deep vein thrombosis)   5. Heterozygous factor V Leiden mutation [facility])    Cancer Staging  Endometrial cancer [facility]) Staging form: Corpus Uteri - Carcinoma and Carcinosarcoma, AJCC 8th Edition - Pathologic stage from 12/25/2019: Stage IA (pT1a, pN0, cM0) - Signed by Cindy Server, Trujillo on 12/25/2019   #Endometrioid endometrial cancer, stage  IA, Loss of MLH1 No LVI, continue follow-up with with GYN oncology alternating with GYN for pelvic examination every 6 months.  # History of DVT, and heterozygous factor V leiden mutation.  Off Eliquis due to easy bruising.  Continue aspirin 81 mg for prophylaxis.   #Chronic thrombocytopenia, previously work up was not remarkable. Bone marrow biopsy was previously done.  Normal cytogenetics and MDS FISH panel. 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  Continue observation.   History of abnormal ultrasound of the abdomen which shows coarse hepatic echotexture.  Possible fatty liver disease.  Recommend her to establish gastroenterology. Reschedule abdomen ultrasound.  Orders Placed This Encounter  Procedures   US Abdomen Complete    Standing Status:   Future    Standing Expiration Date:   06/02/2022    Order Specific Question:   Reason for Exam (SYMPTOM  OR DIAGNOSIS REQUIRED)    Answer:   Thrombocytopenia    Order Specific Question:   Preferred imaging location?    Answer:   [facility]   CBC with Differential/Platelet    Standing Status:   Future    Standing Expiration Date:   06/02/2022   Comprehensive metabolic panel    Standing Status:   Future    Standing Expiration Date:   06/02/2022  Ferritin    Standing Status:   Future    Standing Expiration Date:   06/02/2022   Iron and TIBC    Standing Status:   Future    Standing Expiration Date:   06/02/2022   Immature Platelet Fraction    Standing Status:   Future    Standing Expiration Date:   06/02/2022   Vitamin B12    Standing Status:   Future    Standing Expiration Date:   06/02/2022    All questions were answered. The patient knows to call the clinic with any problems questions or concerns. cc Cindy Crews, Trujillo    Return of visit: 6 months  Cindy Server, MD, PhD Hematology Oncology 06/02/2021

## 2021-06-10 ENCOUNTER — Ambulatory Visit
Admission: RE | Admit: 2021-06-10 | Discharge: 2021-06-10 | Disposition: A | Discharge status: Home / Self Care | Payer: 59 | Source: Ambulatory Visit | Attending: Oncology | Admitting: Oncology

## 2021-06-10 ENCOUNTER — Other Ambulatory Visit: Payer: Self-pay

## 2021-06-10 DIAGNOSIS — D696 Thrombocytopenia, unspecified: Secondary | ICD-10-CM | POA: Diagnosis not present

## 2021-06-10 DIAGNOSIS — D71 Functional disorders of polymorphonuclear neutrophils: Secondary | ICD-10-CM | POA: Diagnosis not present

## 2021-06-10 DIAGNOSIS — D7389 Other diseases of spleen: Secondary | ICD-10-CM | POA: Diagnosis not present

## 2021-06-10 DIAGNOSIS — K760 Fatty (change of) liver, not elsewhere classified: Secondary | ICD-10-CM | POA: Diagnosis not present

## 2021-06-17 ENCOUNTER — Telehealth: Payer: Self-pay

## 2021-06-17 DIAGNOSIS — K760 Fatty (change of) liver, not elsewhere classified: Secondary | ICD-10-CM

## 2021-06-17 NOTE — Telephone Encounter (Signed)
-----   Message from Earlie Server, MD sent at 06/15/2021  4:00 PM EST ----- ?Please refer her to GI. - ask if she has any preference/ see GI before.  ?

## 2021-06-17 NOTE — Telephone Encounter (Signed)
Spoke to pt and informed her of [place] resutls: showed fatty liver disease and also perhaphs cirrhosis. MD recommending pt to establish care with GI. Pt prefers [facility]. Will fax referral to [facility] GI.  ?

## 2021-06-26 ENCOUNTER — Ambulatory Visit: Payer: 59 | Admitting: Family Medicine

## 2021-07-14 ENCOUNTER — Ambulatory Visit: Payer: 59 | Admitting: Family Medicine

## 2021-07-21 ENCOUNTER — Other Ambulatory Visit (HOSPITAL_COMMUNITY): Payer: Self-pay

## 2021-08-11 ENCOUNTER — Other Ambulatory Visit: Payer: Self-pay

## 2021-08-26 ENCOUNTER — Other Ambulatory Visit: Payer: Self-pay

## 2021-08-28 ENCOUNTER — Other Ambulatory Visit: Payer: Self-pay

## 2021-09-28 ENCOUNTER — Other Ambulatory Visit: Payer: Self-pay

## 2021-09-29 ENCOUNTER — Other Ambulatory Visit: Payer: Self-pay

## 2021-10-02 ENCOUNTER — Encounter: Payer: Self-pay | Admitting: Family Medicine

## 2021-10-06 ENCOUNTER — Encounter: Payer: 59 | Admitting: Family Medicine

## 2021-10-07 ENCOUNTER — Encounter: Payer: Self-pay | Admitting: Family Medicine

## 2021-10-07 ENCOUNTER — Other Ambulatory Visit: Payer: Self-pay

## 2021-10-07 ENCOUNTER — Ambulatory Visit (INDEPENDENT_AMBULATORY_CARE_PROVIDER_SITE_OTHER): Payer: 59 | Admitting: Family Medicine

## 2021-10-07 VITALS — BP 124/72 | HR 82 | Temp 97.8°F | Resp 16 | Ht 64.0 in | Wt 215.8 lb

## 2021-10-07 DIAGNOSIS — K219 Gastro-esophageal reflux disease without esophagitis: Secondary | ICD-10-CM

## 2021-10-07 DIAGNOSIS — E1169 Type 2 diabetes mellitus with other specified complication: Secondary | ICD-10-CM | POA: Diagnosis not present

## 2021-10-07 DIAGNOSIS — I82531 Chronic embolism and thrombosis of right popliteal vein: Secondary | ICD-10-CM

## 2021-10-07 DIAGNOSIS — Z90721 Acquired absence of ovaries, unilateral: Secondary | ICD-10-CM

## 2021-10-07 DIAGNOSIS — D509 Iron deficiency anemia, unspecified: Secondary | ICD-10-CM

## 2021-10-07 DIAGNOSIS — E1129 Type 2 diabetes mellitus with other diabetic kidney complication: Secondary | ICD-10-CM

## 2021-10-07 DIAGNOSIS — E785 Hyperlipidemia, unspecified: Secondary | ICD-10-CM

## 2021-10-07 DIAGNOSIS — R7989 Other specified abnormal findings of blood chemistry: Secondary | ICD-10-CM

## 2021-10-07 DIAGNOSIS — D696 Thrombocytopenia, unspecified: Secondary | ICD-10-CM | POA: Diagnosis not present

## 2021-10-07 DIAGNOSIS — Z0000 Encounter for general adult medical examination without abnormal findings: Secondary | ICD-10-CM | POA: Diagnosis not present

## 2021-10-07 DIAGNOSIS — Z90710 Acquired absence of both cervix and uterus: Secondary | ICD-10-CM

## 2021-10-07 DIAGNOSIS — C541 Malignant neoplasm of endometrium: Secondary | ICD-10-CM | POA: Diagnosis not present

## 2021-10-07 DIAGNOSIS — R809 Proteinuria, unspecified: Secondary | ICD-10-CM

## 2021-10-07 MED ORDER — BLOOD GLUCOSE METER KIT
PACK | 0 refills | Status: DC
  Filled 2021-10-07: qty 1, 30d supply, fill #0
  Filled 2021-10-31: qty 1, 1d supply, fill #0

## 2021-10-07 MED ORDER — FREESTYLE LANCETS MISC
0 refills | Status: DC
  Filled 2021-10-07 – 2021-10-31 (×2): qty 100, 25d supply, fill #0

## 2021-10-07 MED ORDER — GLUCOSE BLOOD VI STRP
ORAL_STRIP | 0 refills | Status: DC
  Filled 2021-10-07 – 2021-10-31 (×2): qty 100, 25d supply, fill #0

## 2021-10-08 ENCOUNTER — Other Ambulatory Visit: Payer: Self-pay

## 2021-10-08 LAB — COMPREHENSIVE METABOLIC PANEL
ALT: 25 IU/L (ref 0–32)
AST: 38 IU/L (ref 0–40)
Albumin/Globulin Ratio: 1.6 (ref 1.2–2.2)
Albumin: 4.3 g/dL (ref 3.8–4.8)
Alkaline Phosphatase: 142 IU/L — ABNORMAL HIGH (ref 44–121)
BUN/Creatinine Ratio: 12 (ref 12–28)
BUN: 14 mg/dL (ref 8–27)
Bilirubin Total: 1.2 mg/dL (ref 0.0–1.2)
CO2: 20 mmol/L (ref 20–29)
Calcium: 9.7 mg/dL (ref 8.7–10.3)
Chloride: 105 mmol/L (ref 96–106)
Creatinine, Ser: 1.14 mg/dL — ABNORMAL HIGH (ref 0.57–1.00)
Globulin, Total: 2.7 g/dL (ref 1.5–4.5)
Glucose: 116 mg/dL — ABNORMAL HIGH (ref 70–99)
Potassium: 4.5 mmol/L (ref 3.5–5.2)
Sodium: 140 mmol/L (ref 134–144)
Total Protein: 7 g/dL (ref 6.0–8.5)
eGFR: 54 mL/min/{1.73_m2} — ABNORMAL LOW (ref >59)

## 2021-10-08 LAB — CBC WITH DIFFERENTIAL/PLATELET
Basophils Absolute: 0 10*3/uL (ref 0.0–0.2)
Basos: 1 %
EOS (ABSOLUTE): 0.1 10*3/uL (ref 0.0–0.4)
Eos: 3 %
Hematocrit: 37.3 % (ref 34.0–46.6)
Hemoglobin: 12.9 g/dL (ref 11.1–15.9)
Immature Grans (Abs): 0 10*3/uL (ref 0.0–0.1)
Immature Granulocytes: 0 %
Lymphocytes Absolute: 1.3 10*3/uL (ref 0.7–3.1)
Lymphs: 35 %
MCH: 29.1 pg (ref 26.6–33.0)
MCHC: 34.6 g/dL (ref 31.5–35.7)
MCV: 84 fL (ref 79–97)
Monocytes Absolute: 0.3 10*3/uL (ref 0.1–0.9)
Monocytes: 7 %
Neutrophils Absolute: 2 10*3/uL (ref 1.4–7.0)
Neutrophils: 54 %
Platelets: 70 10*3/uL — CL (ref 150–450)
RBC: 4.43 x10E6/uL (ref 3.77–5.28)
RDW: 13.7 % (ref 11.7–15.4)
WBC: 3.7 10*3/uL (ref 3.4–10.8)

## 2021-10-08 LAB — LIPID PANEL
Chol/HDL Ratio: 3 ratio (ref 0.0–4.4)
Cholesterol, Total: 161 mg/dL (ref 100–199)
HDL: 53 mg/dL (ref >39)
LDL Chol Calc (NIH): 77 mg/dL (ref 0–99)
Triglycerides: 184 mg/dL — ABNORMAL HIGH (ref 0–149)
VLDL Cholesterol Cal: 31 mg/dL (ref 5–40)

## 2021-10-08 LAB — HEMOGLOBIN A1C
Est. average glucose Bld gHb Est-mCnc: 209 mg/dL
Hgb A1c MFr Bld: 8.9 % — ABNORMAL HIGH (ref 4.8–5.6)

## 2021-10-08 MED ORDER — TIRZEPATIDE 2.5 MG/0.5ML ~~LOC~~ SOAJ
2.5000 mg | SUBCUTANEOUS | 0 refills | Status: DC
  Filled 2021-10-08 – 2021-10-31 (×2): qty 2, 28d supply, fill #0

## 2021-10-08 NOTE — Addendum Note (Signed)
Addended by: Myles Gip on: 10/08/2021 02:39 PM   Modules accepted: Orders

## 2021-10-28 ENCOUNTER — Other Ambulatory Visit: Payer: Self-pay

## 2021-10-31 ENCOUNTER — Other Ambulatory Visit: Payer: Self-pay

## 2021-10-31 ENCOUNTER — Encounter: Payer: Self-pay | Admitting: Oncology

## 2021-12-01 ENCOUNTER — Other Ambulatory Visit: Payer: 59

## 2021-12-03 ENCOUNTER — Ambulatory Visit: Payer: 59 | Admitting: Oncology

## 2021-12-10 ENCOUNTER — Other Ambulatory Visit: Payer: 59

## 2021-12-29 ENCOUNTER — Other Ambulatory Visit: Payer: Self-pay

## 2022-01-18 ENCOUNTER — Other Ambulatory Visit: Payer: Self-pay | Admitting: Family Medicine

## 2022-01-19 ENCOUNTER — Other Ambulatory Visit: Payer: Self-pay

## 2022-01-19 MED ORDER — FREESTYLE LITE TEST VI STRP
ORAL_STRIP | 0 refills | Status: DC
  Filled 2022-01-19: qty 100, 25d supply, fill #0

## 2022-01-19 NOTE — Telephone Encounter (Signed)
Requested medications are due for refill today.  unsure  Requested medications are on the active medications list.  Not exactly  Last refill. 10/07/2021 "Test strips"  Future visit scheduled.   yes  Notes to clinic.  Last refill did not have a brand specified.    Requested Prescriptions  Pending Prescriptions Disp Refills   glucose blood (FREESTYLE LITE) test strip 100 each 0    Sig: Use as directed up to four times a day     Endocrinology: Diabetes - Testing Supplies Passed - 01/18/2022 10:15 AM      Passed - Valid encounter within last 12 months    Recent Outpatient Visits           3 months ago Annual physical exam   [facility] Myles Gip, DO   9 months ago Type 2 diabetes mellitus with microalbuminuria, without long-term current use of insulin [facility])   [facility], Dionne Bucy, MD   12 months ago Bell's palsy   [facility] [place], Dionne Bucy, MD   1 year ago Annual physical exam   [place], PA-C   1 year ago Type 2 diabetes mellitus with microalbuminuria, without long-term current use of insulin [facility])   [facility] Bacigalupo, Dionne Bucy, MD       Future Appointments             In 2 months Bacigalupo, Dionne Bucy, MD [facility], [place]

## 2022-01-21 ENCOUNTER — Other Ambulatory Visit: Payer: Self-pay

## 2022-02-09 ENCOUNTER — Encounter (INDEPENDENT_AMBULATORY_CARE_PROVIDER_SITE_OTHER): Payer: Self-pay

## 2022-02-25 ENCOUNTER — Encounter: Payer: Self-pay | Admitting: Obstetrics and Gynecology

## 2022-02-25 ENCOUNTER — Inpatient Hospital Stay: Payer: 59

## 2022-02-25 VITALS — BP 142/72 | HR 97 | Temp 96.5°F | Resp 19 | Wt 215.9 lb

## 2022-03-04 ENCOUNTER — Inpatient Hospital Stay: Payer: 59 | Attending: Oncology | Admitting: Obstetrics and Gynecology

## 2022-03-04 VITALS — BP 121/65 | HR 88 | Temp 97.8°F | Resp 20 | Wt 211.4 lb

## 2022-03-04 DIAGNOSIS — Z8542 Personal history of malignant neoplasm of other parts of uterus: Secondary | ICD-10-CM | POA: Insufficient documentation

## 2022-03-04 DIAGNOSIS — C541 Malignant neoplasm of endometrium: Secondary | ICD-10-CM

## 2022-03-04 DIAGNOSIS — Z7901 Long term (current) use of anticoagulants: Secondary | ICD-10-CM | POA: Diagnosis not present

## 2022-03-04 DIAGNOSIS — Z90722 Acquired absence of ovaries, bilateral: Secondary | ICD-10-CM | POA: Diagnosis not present

## 2022-03-04 DIAGNOSIS — Z86718 Personal history of other venous thrombosis and embolism: Secondary | ICD-10-CM | POA: Insufficient documentation

## 2022-03-04 DIAGNOSIS — Z90710 Acquired absence of both cervix and uterus: Secondary | ICD-10-CM | POA: Insufficient documentation

## 2022-03-04 NOTE — Progress Notes (Signed)
Gynecologic Oncology Interval Visit   Referring Provider: Adrian Prows, MD  Chief Concern: endometrial cancer  Subjective:  Cindy Trujillo is a 66 y.o. G0P0 female who is seen in consultation from Dr. Gilman Schmidt for endometrial cancer.   She presents today for surveillance. She continues to see Dr. Tasia Catchings for thrombophilia. No new medical issues or surgeries.   Gynecologic Oncology History Cindy Trujillo is a pleasant female who is seen in consultation from Dr. Gilman Schmidt for endometrial cancer.  She reported PMB which has been occurring on and off since June of 2021.  Evaluation included the following:   Pelvic US 09/21/2019 FINDINGS: Uterus: measurements: 8.6 x 3.2 x 4.9 cm = volume: 71.8 mL. Uterus is anteverted. No fibroids or other mass. Endometrium: Focal endometrial lesion/thickening measuring 1.1 x 0.9 x 1.4 cm seen within the central aspect of the endometrial complex. Associated scattered areas of internal vascularity. Elsewhere, the endometrial stripe is thin measuring no more than 2 mm.  Right ovary: Not visualized.  No adnexal mass. Left ovary: Measurements: 1.9 x 1.5 x 1.9 cm = volume: 1.7 mL. Normal appearance/no adnexal mass. IMPRESSION: 1. 1.1 x 0.9 x 1.4 cm focal endometrial lesion/thickening as above. Finding is nonspecific, with primary differential considerations including an endometrial polyp, focal endometrial hyperplasia, or possibly endometrial carcinoma. In the setting of post-menopausal bleeding, endometrial sampling is indicated to exclude carcinoma. If results are benign, sonohysterogram should be considered for focal lesion work-up prior to hysteroscopy. (Ref: Radiological Reasoning: Algorithmic Workup of Abnormal Vaginal Bleeding with Endovaginal Sonography and Sonohysterography. AJR 2008; 161:W96-04). 2. Otherwise normal sonographic appearance of the uterus. 3. Normal left ovary, with nonvisualization of the right ovary. No adnexal mass or free fluid.  Pap  09/21/2019 NILM; HRHPV negative  10/31/2019 D&C with Myosure Uterus sounded to 9 cm.  Hysteroscopy revealed polypoid tissue growing from the right cornua.   Pathology A. ENDOMETRIUM, CURETTAGE:  - ENDOMETRIAL ADENOCARCINOMA, ENDOMETRIOID TYPE, FIGO GRADE 1.  - BACKGROUND EIN / ATYPICAL HYPERPLASIA.  Of note she had a DVT in May of 2020. Currently on Eliquis. DVT was found incidentally to her procedure for varicose veins.    She also has h/o low platelets and follows up with hematology/oncology for a bone marrow biopsy.    12/06/2019 She underwent Exam under anesthesia, Robotic assisted total hysterectomy and bilateral salpingo-oophorectomy with sentinel node injection, mapping, and biopsies.        - ENDOMETRIOID ENDOMETRIAL ADENOCARCINOMA, LOW GRADE (FIGO 1), INVASIVE 2 MM OF 16 MM TOTAL MYOMETRIAL THICKNESS (12.5%).       - FALLOPIAN TUBES and OVARIES: BENIGN  B. LYMPH NODE, RIGHT EXTERNAL ILIAC SENTINEL; EXCISION:  - ONE LYMPH NODE, NEGATIVE FOR MALIGNANCY (0/1).  C. LYMPH NODE, LEFT EXTERNAL ILIAC SENTINEL; EXCISION:  - THREE LYMPH NODES, NEGATIVE FOR MALIGNANCY (0/3).  D. LYMPH NODE, RIGHT EXTERNAL ILIAC; EXCISION:  - TWO LYMPH NODES, NEGATIVE FOR MALIGNANCY (0/2).   Immunohistochemistry (IHC) Testing for DNA Mismatch Repair (MMR)  Proteins:  Results:  MLH1: Loss of protein expression  MSH2: Intact nuclear expression  MSH6: Intact nuclear expression  PMS2: Loss of protein expression  IHC Interpretation: Abnormal result with absent nuclear staining for  MLH1 and PMS2, high probability of MSI-H.   MLH1 Methylation Analysis  MLH1 Result: POSITIVE        Problem List: Patient Active Problem List   Diagnosis Date Noted   Bell's palsy 01/24/2021   Persistent headaches 01/24/2021   Fatty liver disease, nonalcoholic 54/12/8117   IDA (iron  deficiency anemia) 07/01/2020   Thrombocytopenia ([place]) 03/28/2020   Normocytic anemia 01/24/2020   Elevated LFTs 01/24/2020   Elevated  bilirubin 01/24/2020   Hypocalcemia 01/24/2020   S/P hysterectomy with oophorectomy 12/07/2019   Endometrial cancer (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 mellitus ([place]) 06/23/2018   Varicose veins of leg with pain, bilateral 05/20/2018   Varicose veins of both lower extremities 08/27/2017   T2DM (type 2 diabetes mellitus) ([place])    Hyperlipidemia associated with type 2 diabetes mellitus ([place])    Allergic rhinitis 12/11/2016   GERD (gastroesophageal reflux disease) 12/11/2016   Morbid obesity ([place]) 12/11/2016    Past Medical History: Past Medical History:  Diagnosis Date   Anemia    Bell's palsy    Diabetes mellitus without complication ([place])    DVT (deep venous thrombosis) ([place]) 08/2018   right leg   GERD (gastroesophageal reflux disease)    Hyperlipidemia    Post-menopausal bleeding 09/21/2019    Past Surgical History: Past Surgical History:  Procedure Laterality Date   COLONOSCOPY WITH PROPOFOL N/A 08/23/2017   Procedure: COLONOSCOPY WITH PROPOFOL;  Surgeon: Manya Silvas, MD;  Location: [facility] ENDOSCOPY;  Service: Endoscopy;  Laterality: N/A;   ENDOVENOUS ABLATION SAPHENOUS VEIN W/ LASER Left 02/29/2020   endovenous laser ablation left greater saphenous vein and stab phlebectomy 10-20 incisions left leg by Gae Gallop MD    HYSTEROSCOPY WITH D & C N/A 10/31/2019   Procedure: DILATATION AND CURETTAGE /HYSTEROSCOPY WITH MYOSURE RESECTION;  Surgeon: Homero Fellers, MD;  Location: [facility] ORS;  Service: Gynecology;  Laterality: N/A;   ROBOTIC ASSISTED TOTAL HYSTERECTOMY WITH BILATERAL SALPINGO OOPHERECTOMY N/A 12/06/2019   Procedure: XI ROBOTIC ASSISTED TOTAL HYSTERECTOMY WITH BILATERAL SALPINGO OOPHORECTOMY;  Surgeon: Gillis Ends, MD;  Location: [facility] ORS;  Service: Gynecology;  Laterality: N/A;   SENTINEL NODE BIOPSY N/A 12/06/2019   Procedure: SENTINEL NODE  BIOPSY;  Surgeon: Gillis Ends, MD;  Location: [facility] ORS;  Service: Gynecology;  Laterality: N/A;   TONSILLECTOMY     TONSILLECTOMY AND ADENOIDECTOMY  1968   VEIN SURGERY  05/2019   laser    Past Gynecologic History:  Gynecological History Menarche: 87 Menopause: 36 Describes periods as regular, monthly Last pap smear: 09/21/2019- NIL Last Mammogram: None, patient only has clinical exams- declines mammogram   Hx OCP usage for for 15-18 years Hx obesity for 20 years    OB History:  G0P0 OB History  Gravida Para Term Preterm AB Living  0 0 0 0 0 0  SAB IAB Ectopic Multiple Live Births  0 0 0 0 0    Family History: Family History  Problem Relation Age of Onset   COPD Mother        heavy smoker   Pulmonary fibrosis Mother    Fibromyalgia Mother    COPD Father    Melanoma Father    COPD Sister    COPD Brother    COPD Sister    COPD Sister    Healthy Brother    Heart failure Maternal Grandmother    Heart attack Maternal Grandfather    Cancer Paternal Grandmother        unsure of type, had mets in colon   Heart attack Paternal Grandfather    Cancer Paternal Grandfather        unknown   Breast cancer Neg Hx    Cervical cancer Neg Hx  Social History: Retired worked a a Production assistant, radio Social History   Socioeconomic History   Marital status: Married    Spouse name: Futures trader of children: 0   Years of education: some college   Highest education level: Not on file  Occupational History   Occupation: CARE MANGEMENT    Employer: [employer]  Tobacco Use   Smoking status: Never   Smokeless tobacco: Never  Vaping Use   Vaping Use: Never used  Substance and Sexual Activity   Alcohol use: No    Alcohol/week: 0.0 standard drinks of alcohol   Drug use: No   Sexual activity: Not Currently    Birth control/protection: Surgical  Other Topics Concern   Not on file  Social History Narrative   Not on file   Social  Determinants of Health   Financial Resource Strain: Not on file  Food Insecurity: Not on file  Transportation Needs: Not on file  Physical Activity: Not on file  Stress: Not on file  Social Connections: Not on file  Intimate Partner Violence: Not on file    Allergies: Allergies  Allergen Reactions   Sulfa Antibiotics Hives    Current Medications: Current Outpatient Medications  Medication Sig Dispense Refill   artificial tears (LACRILUBE) OINT ophthalmic ointment Place into the left eye at bedtime as needed for dry eyes. 7 g 1   aspirin EC 81 MG tablet Take 1 tablet (81 mg total) by mouth daily. Swallow whole. 90 tablet 0   blood glucose meter kit and supplies Dispense based on patient and insurance preference. Use up to four times daily as directed. (FOR ICD-10 E10.9, E11.9). 1 each 0   Blood Glucose Monitoring Suppl (FREESTYLE LITE) DEVI      dapagliflozin propanediol (FARXIGA) 10 MG TABS tablet Take 1 tablet (10 mg total) by mouth daily. 90 tablet 3   ferrous sulfate 325 (65 FE) MG tablet TAKE 1 TABLET BY MOUTH TWICE DAILY 60 tablet 2   FreeStyle Unistick II Lancets MISC Use as instructed to check blood glucose daily 100 each 5   glucose blood (FREESTYLE LITE) test strip Use as directed up to four times a day 100 each 0   glucose blood (FREESTYLE TEST STRIPS) test strip Use as instructed to check blood glucose daily 100 each 12   glucose monitoring kit (FREESTYLE) monitoring kit 1 each by Does not apply route as needed for other. 1 each 0   Lancets (FREESTYLE) lancets      Lancets (FREESTYLE) lancets Use as directed up to four times a day 100 each 0   lisinopril (ZESTRIL) 5 MG tablet TAKE 1 TABLET (5 MG TOTAL) BY MOUTH DAILY. 90 tablet 3   loratadine (CLARITIN) 10 MG tablet Take 1 tablet (10 mg total) by mouth daily. 90 tablet 3   Magnesium 400 MG TABS Take 1 tablet by mouth daily.     metFORMIN (GLUCOPHAGE) 1000 MG tablet TAKE 1 TABLET BY MOUTH TWICE DAILY WITH A MEAL. 180  tablet 3   Multiple Vitamin (MULTIVITAMIN WITH MINERALS) TABS tablet Take 1 tablet by mouth 2 (two) times a week.     omeprazole (PRILOSEC) 20 MG capsule Take 1 capsule (20 mg total) by mouth daily. 90 capsule 3   rosuvastatin (CRESTOR) 5 MG tablet Take 1 tablet (5 mg total) by mouth daily. 90 tablet 3   vitamin B-12 (CYANOCOBALAMIN) 1000 MCG tablet Take 1 tablet (1,000 mcg total) by mouth daily. (Patient taking differently:  Take 1,000 mcg by mouth every evening.) 90 tablet 1   tirzepatide (MOUNJARO) 2.5 MG/0.5ML Pen Inject 2.5 mg into the skin once a week. 2 mL 0   No current facility-administered medications for this visit.    Review of Systems  General: no complaints  HEENT: no complaints  Lungs: no complaints  Cardiac: no complaints  GI: no complaints  GU: no complaints  Musculoskeletal: no complaints  Extremities: no complaints  Skin: no complaints  Neuro: no complaints  Endocrine: no complaints  Psych: no complaints         Objective:  Physical Examination:  BP 121/65   Pulse 88   Temp 97.8 F (36.6 C)   Resp 20   Wt 211 lb 6.4 oz (95.9 kg)   SpO2 100%   BMI 36.29 kg/m    ECOG Performance Status: 0 - Asymptomatic GENERAL: Patient is a well appearing female in no acute distress HEENT:  Atraumatic and normocephalic. PERRL, neck supple. NODES:  No cervical, supraclavicular, axillary, or inguinal lymphadenopathy palpated.  LUNGS:  Clear to auscultation bilaterally.  No wheezes. HEART:  Regular rate and rhythm.  ABDOMEN:  Soft, nontender. Nondistended. No masses/ascites/hernia/or hepatomegaly.  EXTREMITIES:  No peripheral edema.   SKIN:  Incisions all healed NEURO:  Nonfocal. Well oriented.  Appropriate affect.  Pelvic: EGBUS: no lesions Cervix: surgically absent Vagina: no lesions, no discharge or bleeding. Mild eythema at the top of the vagina. Smooth on palpation.  Uterus: surgically absent BME: no palpable masses Rectovaginal: deferred   Lab  Review N/A   Chemistry      Component Value Date/Time   NA 140 10/07/2021 1454   K 4.5 10/07/2021 1454   CL 105 10/07/2021 1454   CO2 20 10/07/2021 1454   BUN 14 10/07/2021 1454   CREATININE 1.14 (H) 10/07/2021 1454   CREATININE 0.81 01/15/2017 1026      Component Value Date/Time   CALCIUM 9.7 10/07/2021 1454   ALKPHOS 142 (H) 10/07/2021 1454   AST 38 10/07/2021 1454   ALT 25 10/07/2021 1454   BILITOT 1.2 10/07/2021 1454     Lab Results  Component Value Date   WBC 3.7 10/07/2021   HGB 12.9 10/07/2021   HCT 37.3 10/07/2021   MCV 84 10/07/2021   PLT 70 (LL) 10/07/2021     Radiologic Imaging: As per HPI    Assessment:  Cindy Trujillo is a 66 y.o. 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   Thrombocytopenia  Elevated AST, labs 09/2021 improved  Medical co-morbidities complicating care:  DVT on anti-coagulation, GERD, diabetes, prior abdominal surgery and immunocompromised. Body mass index is 36.29 kg/m.  Plan:   Problem List Items Addressed This Visit       Genitourinary   Endometrial cancer ([place]) - Primary     We previously recommended continued close follow up with exams, including pelvic exams 6 months for 5 years and then annually thereafter.  We previously reviewed the following: Imaging and laboratory assessment is based on clinical indication. Patient education for obesity, lifestyle, exercise, nutrition, weight loss.   Dr. Gilman Schmidt has left practice. We will see her back in 3-4 months to check on the vaginal erythema. She will identify another gynecologist in the interim and we can consider alternating after her next appointment as long as exam is reassuring.  Recommended continued follow up with Dr. Tasia Catchings.   The patient's diagnosis, an  outline of the further diagnostic and laboratory studies which will be required, the recommendation, and alternatives were discussed.  All questions were answered to the patient's satisfaction.   I personally had a face to face interaction and evaluated the patient. I have reviewed her history and available records and have performed the physical exam including. The assessment and plan which was fully formulated by me.  Counseling was completed by me.   Jalan Bodi Gaetana Michaelis, MD

## 2022-03-11 ENCOUNTER — Other Ambulatory Visit: Payer: Self-pay | Admitting: Gastroenterology

## 2022-03-11 DIAGNOSIS — K7581 Nonalcoholic steatohepatitis (NASH): Secondary | ICD-10-CM

## 2022-03-12 ENCOUNTER — Encounter: Payer: Self-pay | Admitting: Oncology

## 2022-03-12 NOTE — Telephone Encounter (Signed)
Please advise on appt.

## 2022-03-17 ENCOUNTER — Inpatient Hospital Stay: Payer: Commercial Managed Care - PPO

## 2022-03-18 NOTE — Progress Notes (Unsigned)
I,Sha'taria Tyson,acting as a Education administrator for [employer], PA-C.,have documented all relevant documentation on the behalf of Mikey Kirschner, PA-C,as directed by  Mikey Kirschner, PA-C while in the presence of Mikey Kirschner, PA-C.   Established patient visit   Patient: Cindy Trujillo   DOB: [birth year]   66 y.o. Female  MRN: [id number] Visit Date: 03/19/2022  Today's healthcare provider: Mikey Kirschner, PA-C   Cc. Cough, sinus pressure, fever x 5 days  Subjective    HPI  Pt has PMH of endometrial ca, thrombophilia, NASH w/ liver bx scheduled next week reports symptoms of a productive cough with yellow-green mucous, nasal congestion, sinus pressure, body aches, tmax of 101.4 yesterday. Reports COVID test negative 03/16/22. Symptoms x 5 days.  Denies chest pain, SOB, wheezing.  Medications: Outpatient Medications Prior to Visit  Medication Sig   artificial tears (LACRILUBE) OINT ophthalmic ointment Place into the left eye at bedtime as needed for dry eyes.   aspirin EC 81 MG tablet Take 1 tablet (81 mg total) by mouth daily. Swallow whole.   blood glucose meter kit and supplies Dispense based on patient and insurance preference. Use up to four times daily as directed. (FOR ICD-10 E10.9, E11.9).   Blood Glucose Monitoring Suppl (FREESTYLE LITE) DEVI    dapagliflozin propanediol (FARXIGA) 10 MG TABS tablet Take 1 tablet (10 mg total) by mouth daily.   ferrous sulfate 325 (65 FE) MG tablet TAKE 1 TABLET BY MOUTH TWICE DAILY   FreeStyle Unistick II Lancets MISC Use as instructed to check blood glucose daily   glucose blood (FREESTYLE LITE) test strip Use as directed up to four times a day   glucose blood (FREESTYLE TEST STRIPS) test strip Use as instructed to check blood glucose daily   glucose monitoring kit (FREESTYLE) monitoring kit 1 each by Does not apply route as needed for other.   Lancets (FREESTYLE) lancets    Lancets (FREESTYLE) lancets Use as directed up to four times a day    lisinopril (ZESTRIL) 5 MG tablet TAKE 1 TABLET (5 MG TOTAL) BY MOUTH DAILY.   loratadine (CLARITIN) 10 MG tablet Take 1 tablet (10 mg total) by mouth daily.   Magnesium 400 MG TABS Take 1 tablet by mouth daily.   metFORMIN (GLUCOPHAGE) 1000 MG tablet TAKE 1 TABLET BY MOUTH TWICE DAILY WITH A MEAL.   Multiple Vitamin (MULTIVITAMIN WITH MINERALS) TABS tablet Take 1 tablet by mouth 2 (two) times a week.   omeprazole (PRILOSEC) 20 MG capsule Take 1 capsule (20 mg total) by mouth daily.   rosuvastatin (CRESTOR) 5 MG tablet Take 1 tablet (5 mg total) by mouth daily.   tirzepatide [facility]) 2.5 MG/0.5ML Pen Inject 2.5 mg into the skin once a week.   vitamin B-12 (CYANOCOBALAMIN) 1000 MCG tablet Take 1 tablet (1,000 mcg total) by mouth daily. (Patient taking differently: Take 1,000 mcg by mouth every evening.)   No facility-administered medications prior to visit.    Review of Systems  Constitutional:  Positive for fatigue and fever.  HENT:  Positive for congestion, sinus pressure, sinus pain and sore throat.   Respiratory:  Positive for cough. Negative for shortness of breath.   Cardiovascular:  Negative for chest pain and leg swelling.  Gastrointestinal:  Negative for abdominal pain.  Neurological:  Negative for dizziness and headaches.      Objective    Blood pressure (!) 110/54, pulse 95, temperature 98.1 F (36.7 C), temperature source Oral, weight 209 lb 3.2 oz (94.9  kg), SpO2 97 %.   Physical Exam Constitutional:      General: She is awake.     Appearance: She is well-developed.  HENT:     Head: Normocephalic.     Right Ear: Tympanic membrane normal.     Left Ear: Tympanic membrane normal.     Nose: Congestion and rhinorrhea present.     Mouth/Throat:     Pharynx: Posterior oropharyngeal erythema present. No oropharyngeal exudate.  Eyes:     Conjunctiva/sclera: Conjunctivae normal.  Cardiovascular:     Rate and Rhythm: Normal rate and regular rhythm.     Heart sounds: Normal  heart sounds.  Pulmonary:     Effort: Pulmonary effort is normal.     Breath sounds: Normal breath sounds. No wheezing, rhonchi or rales.  Skin:    General: Skin is warm.  Neurological:     Mental Status: She is alert and oriented to person, place, and time.  Psychiatric:        Attention and Perception: Attention normal.        Mood and Affect: Mood normal.        Speech: Speech normal.        Behavior: Behavior is cooperative.     No results found for any visits on 03/19/22.  Assessment & Plan     URI Recent health at work covid test negative Would have preferred to test for flu a/b but no tests available in office Advised pt is out of antiviral window regardless Encouraged increase fluids, tylenol, mucinex, delsym cough syrup If no improvement in 3-5 days please call office. Advised pt not to work today/tomorrow to give her the best chance at improving before her scheduled bx next week  Return if symptoms worsen or fail to improve.      {I, Mikey Kirschner, PA-C have reviewed all documentation for this visit. The documentation on  03/19/2022  for the exam, diagnosis, procedures, and orders are all accurate and complete.  Mikey Kirschner, PA-C [facility] [address] [place], [place], [address] Office: [phone] Fax: [place]

## 2022-03-19 ENCOUNTER — Ambulatory Visit: Payer: 59 | Admitting: Oncology

## 2022-03-19 ENCOUNTER — Ambulatory Visit: Payer: 59 | Admitting: Physician Assistant

## 2022-03-19 ENCOUNTER — Encounter: Payer: Self-pay | Admitting: Physician Assistant

## 2022-03-19 VITALS — BP 110/54 | HR 95 | Temp 98.1°F | Wt 209.2 lb

## 2022-03-19 DIAGNOSIS — J069 Acute upper respiratory infection, unspecified: Secondary | ICD-10-CM

## 2022-03-24 ENCOUNTER — Other Ambulatory Visit: Payer: Self-pay | Admitting: Radiology

## 2022-03-24 DIAGNOSIS — C541 Malignant neoplasm of endometrium: Secondary | ICD-10-CM

## 2022-03-24 IMAGING — US US PELVIS COMPLETE WITH TRANSVAGINAL
1 series · 15 of 25 positions shown · non-contrast
Comparison: None available.

CLINICAL DATA: Initial evaluation for postmenopausal bleeding.



[Series 1: us pelvis complete with transvaginal · 15 of 60 slices shown]
[im 1/60]
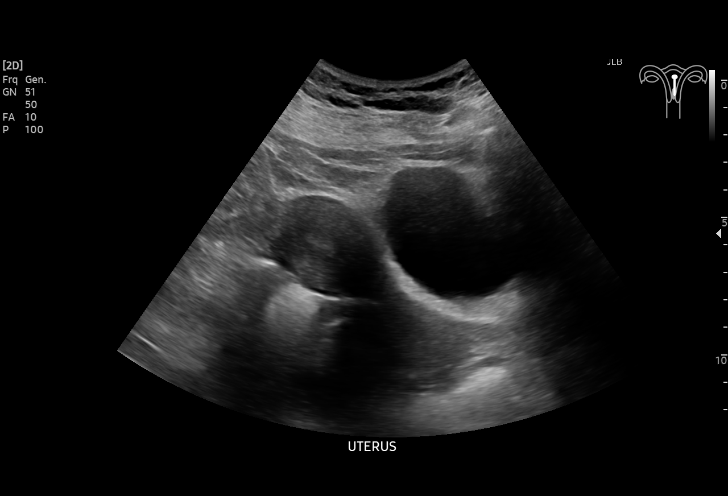
[im 5/60]
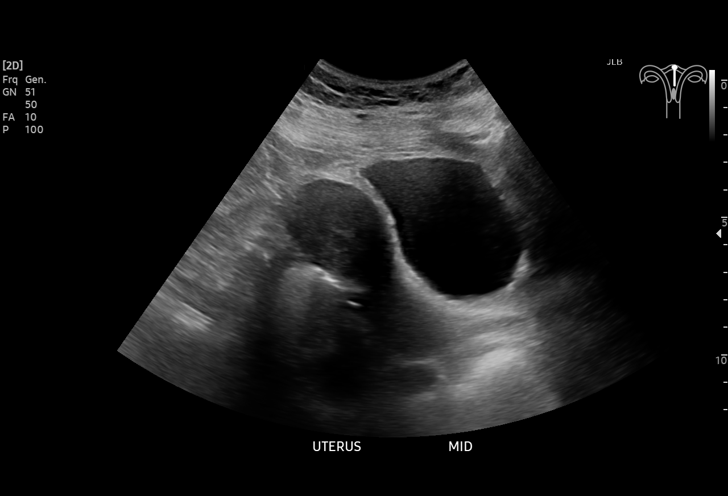
[im 10/60]
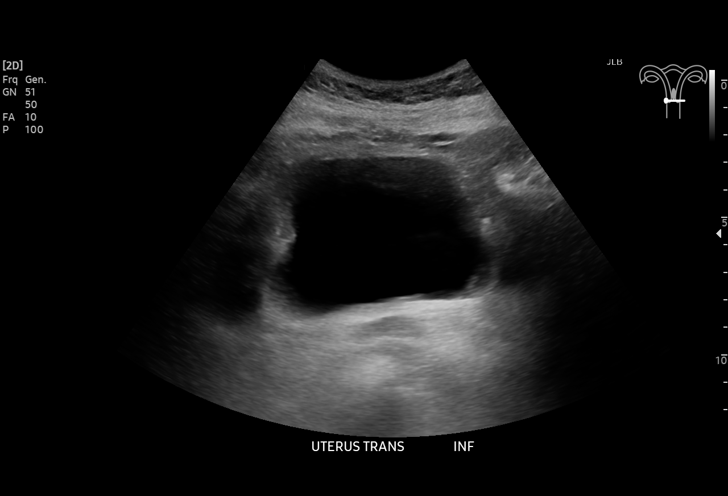
[im 13/60]
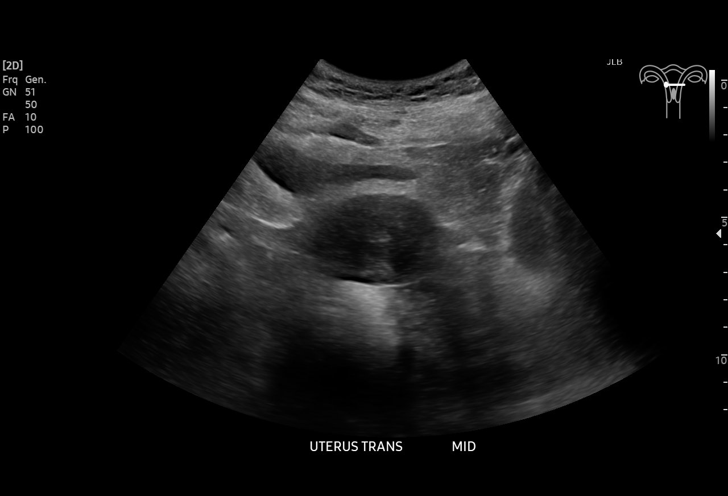
[im 18/60]
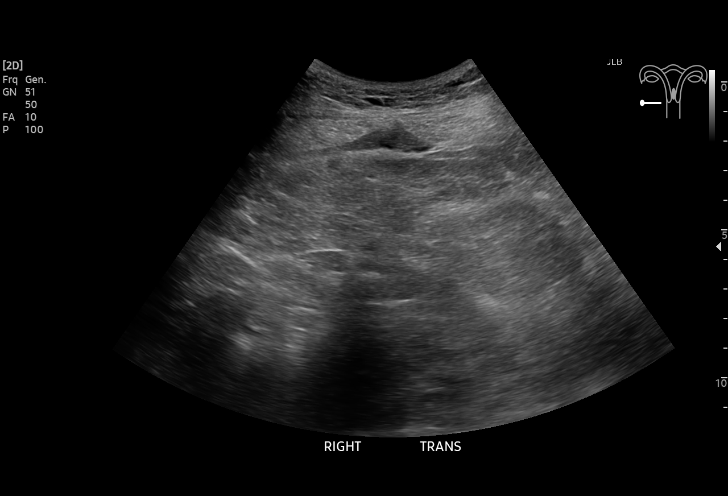
[im 23/60]
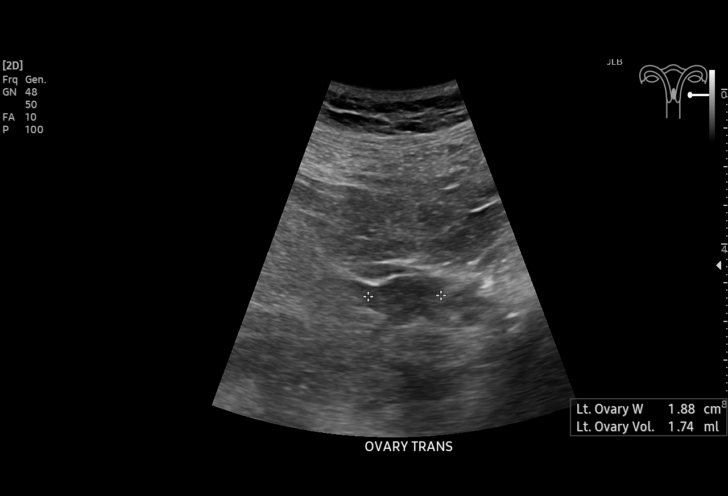
[im 25/60]
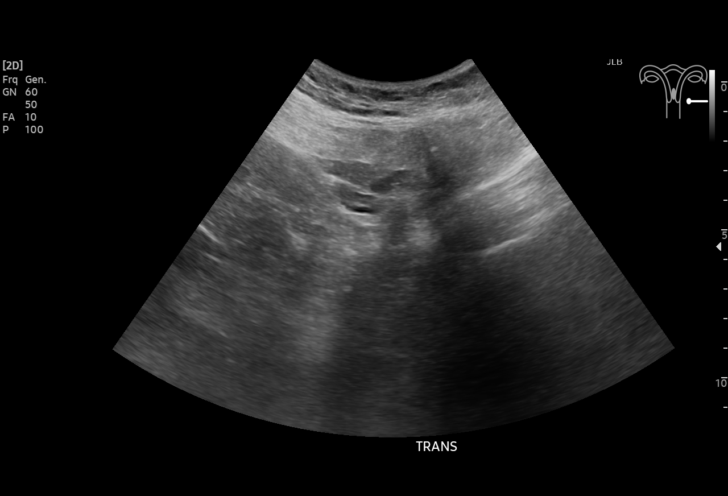
[im 30/60]
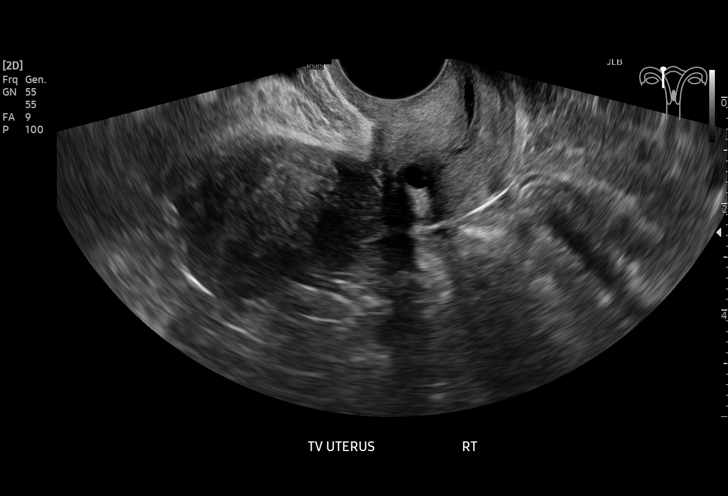
[im 35/60]
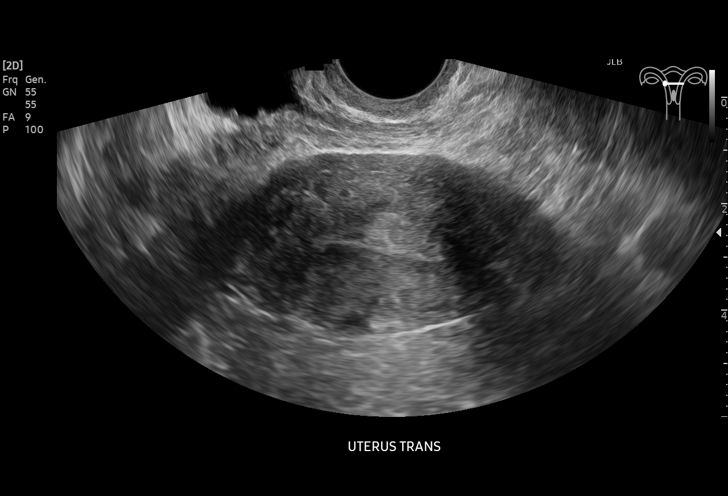
[im 37/60]
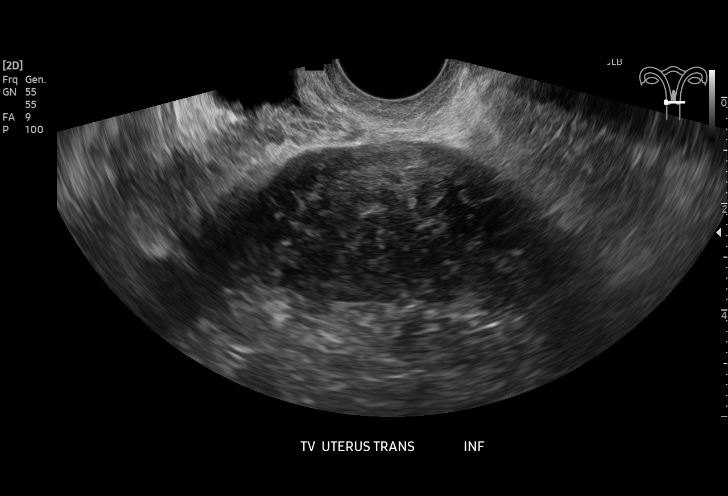
[im 42/60]
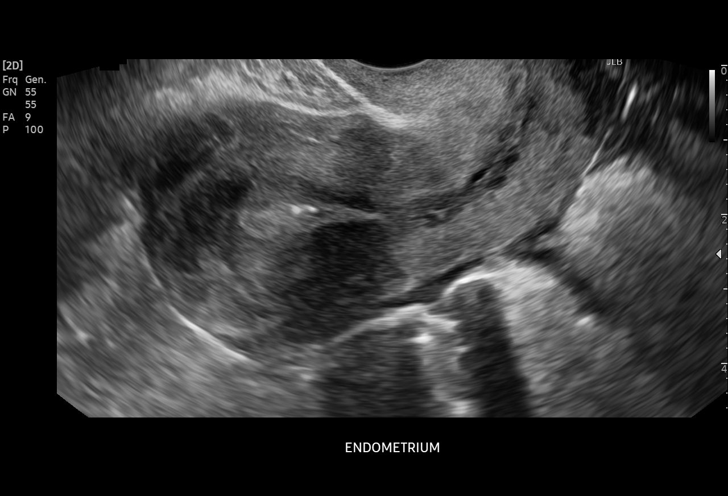
[im 47/60]
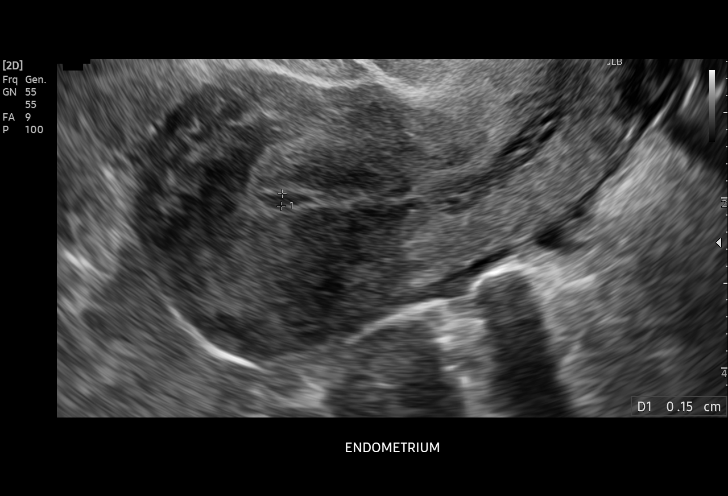
[im 50/60]
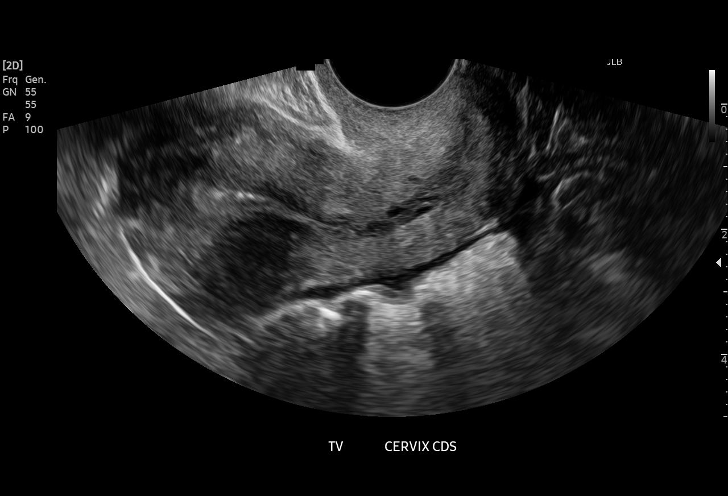
[im 55/60]
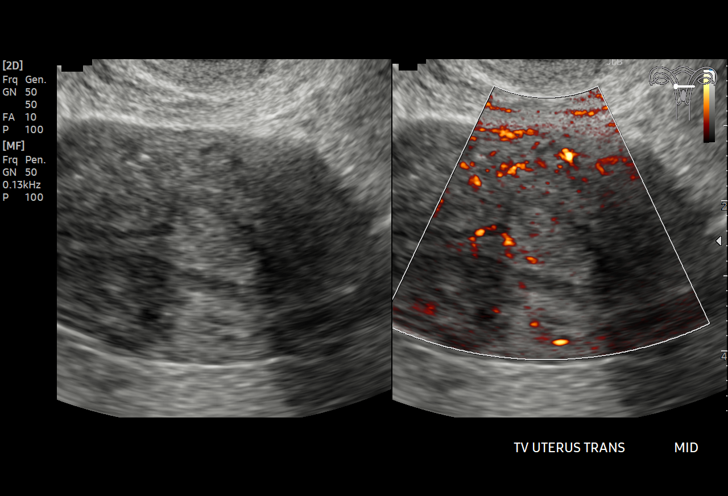
[im 60/60]
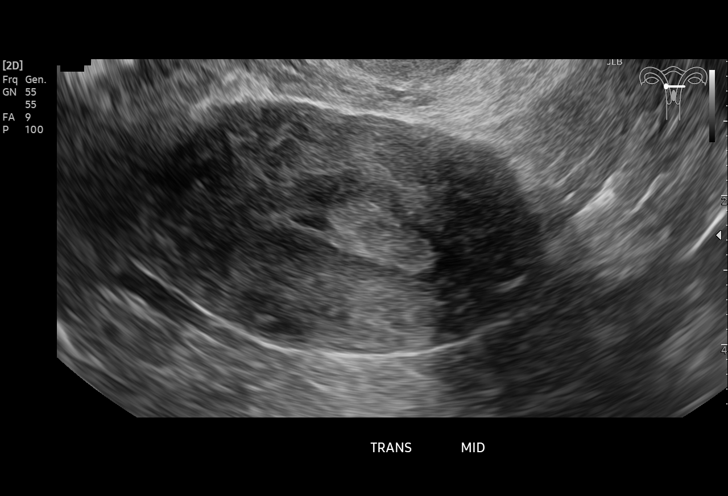

[15 of 25 positions shown; findings below may reference images not displayed]

FINDINGS: Uterus

Measurements: 8.6 x 3.2 x 4.9 cm = volume: 71.8 mL. Uterus is
anteverted. No fibroids or other mass visualized.

Endometrium

Focal endometrial lesion/thickening measuring 1.1 x 0.9 x 1.4 cm
seen within the central aspect of the endometrial complex.
Associated scattered areas of internal vascularity. Elsewhere, the
endometrial stripe is thin measuring no more than 2 mm. Small amount
of free fluid seen within the adjacent endometrial cavity.

Right ovary

Not visualized.  No adnexal mass.

Left ovary

Measurements: 1.9 x 1.5 x 1.9 cm = volume: 1.7 mL. Normal
appearance/no adnexal mass.

Other findings

No abnormal free fluid.
IMPRESSION: 1. 1.1 x 0.9 x 1.4 cm focal endometrial lesion/thickening as above.
Finding is nonspecific, with primary differential considerations
including an endometrial polyp, focal endometrial hyperplasia, or
possibly endometrial carcinoma. In the setting of post-menopausal
bleeding, endometrial sampling is indicated to exclude carcinoma. If
results are benign, sonohysterogram should be considered for focal
lesion work-up prior to hysteroscopy. (Ref: Radiological Reasoning:
Algorithmic Workup of Abnormal Vaginal Bleeding with Endovaginal
Sonography and Sonohysterography. AJR 4220; 191:S68-73).
2. Otherwise normal sonographic appearance of the uterus.
3. Normal left ovary, with nonvisualization of the right ovary. No
adnexal mass or free fluid.

## 2022-03-24 NOTE — Progress Notes (Signed)
Patient for US guided Liver Biopsy on Wed 03/25/2022, I called and spoke with the patient on the phone and gave pre-procedure instructions. Pt was made aware to be here at 7:30a at the new entrance, last dose of Aspirin 38m was Thurs 03/19/2022,  NPO after MN prior to procedure as well as driver post procedure/recovery/discharge.  Pt stated understanding.  Called 03/19/2022

## 2022-03-24 NOTE — H&P (Signed)
Chief Complaint: Patient was seen in consultation today for non-focal liver biopsy  Referring Physician(s): Locklear,Cameron T  Supervising Physician: {Supervising Physician:[id number]}  Patient Status: [facility] - Out-pt  History of Present Illness: Cindy Trujillo is a 66 y.o. female with a medical history significant for obesity, DM2, anemia, endometrial cancer s/p surgery, Bell's palsy and HTN. Recent imaging shows fatty liver, possibly cirrhosis. Hepatitis labs have been checked and are negative. Liver functions tests rise and fall but platelets have been down trending for several years.   Interventional Radiology has been asked to evaluate this patient for an image-guided non-focal liver biopsy.   Past Medical History:  Diagnosis Date   Anemia    Bell's palsy    Diabetes mellitus without complication ([place])    DVT (deep venous thrombosis) ([place]) 08/2018   right leg   GERD (gastroesophageal reflux disease)    Hyperlipidemia    Post-menopausal bleeding 09/21/2019    Past Surgical History:  Procedure Laterality Date   COLONOSCOPY WITH PROPOFOL N/A 08/23/2017   Procedure: COLONOSCOPY WITH PROPOFOL;  Surgeon: Manya Silvas, MD;  Location: [facility] ENDOSCOPY;  Service: Endoscopy;  Laterality: N/A;   ENDOVENOUS ABLATION SAPHENOUS VEIN W/ LASER Left 02/29/2020   endovenous laser ablation left greater saphenous vein and stab phlebectomy 10-20 incisions left leg by Gae Gallop MD    HYSTEROSCOPY WITH D & C N/A 10/31/2019   Procedure: DILATATION AND CURETTAGE /HYSTEROSCOPY WITH MYOSURE RESECTION;  Surgeon: Homero Fellers, MD;  Location: [facility] ORS;  Service: Gynecology;  Laterality: N/A;   ROBOTIC ASSISTED TOTAL HYSTERECTOMY WITH BILATERAL SALPINGO OOPHERECTOMY N/A 12/06/2019   Procedure: XI ROBOTIC ASSISTED TOTAL HYSTERECTOMY WITH BILATERAL SALPINGO OOPHORECTOMY;  Surgeon: Gillis Ends, MD;  Location: [facility] ORS;  Service: Gynecology;  Laterality: N/A;   SENTINEL NODE BIOPSY  N/A 12/06/2019   Procedure: SENTINEL NODE BIOPSY;  Surgeon: Gillis Ends, MD;  Location: [facility] ORS;  Service: Gynecology;  Laterality: N/A;   TONSILLECTOMY     TONSILLECTOMY AND ADENOIDECTOMY  1968   VEIN SURGERY  05/2019   laser    Allergies: Sulfa antibiotics  Medications: Prior to Admission medications   Medication Sig Start Date End Date Taking? Authorizing Provider  artificial tears (LACRILUBE) OINT ophthalmic ointment Place into the left eye at bedtime as needed for dry eyes. 01/24/21   Virginia Crews, MD  aspirin EC 81 MG tablet Take 1 tablet (81 mg total) by mouth daily. Swallow whole. 11/15/20   Earlie Server, MD  blood glucose meter kit and supplies Dispense based on patient and insurance preference. Use up to four times daily as directed. (FOR ICD-10 E10.9, E11.9). 10/07/21   Myles Gip, DO  Blood Glucose Monitoring Suppl (FREESTYLE LITE) DEVI  06/26/19   [provider]  dapagliflozin propanediol (FARXIGA) 10 MG TABS tablet Take 1 tablet (10 mg total) by mouth daily. 03/28/21   Virginia Crews, MD  ferrous sulfate 325 (65 FE) MG tablet TAKE 1 TABLET BY MOUTH TWICE DAILY 07/04/20 03/04/22  Earlie Server, MD  FreeStyle Unistick II Lancets MISC Use as instructed to check blood glucose daily 06/26/19   Virginia Crews, MD  glucose blood (FREESTYLE LITE) test strip Use as directed up to four times a day 01/19/22   Virginia Crews, MD  glucose blood (FREESTYLE TEST STRIPS) test strip Use as instructed to check blood glucose daily 06/26/19   Virginia Crews, MD  glucose monitoring kit (FREESTYLE) monitoring kit 1 each by Does not apply  route as needed for other. 06/26/19   Virginia Crews, MD  Lancets (FREESTYLE) lancets  06/26/19   [provider]  Lancets (FREESTYLE) lancets Use as directed up to four times a day 10/07/21   Myles Gip, DO  lisinopril (ZESTRIL) 5 MG tablet TAKE 1 TABLET (5 MG TOTAL) BY MOUTH DAILY. 03/28/21 03/28/22   Virginia Crews, MD  loratadine (CLARITIN) 10 MG tablet Take 1 tablet (10 mg total) by mouth daily. 03/28/21   Virginia Crews, MD  Magnesium 400 MG TABS Take 1 tablet by mouth daily.    [provider]  metFORMIN (GLUCOPHAGE) 1000 MG tablet TAKE 1 TABLET BY MOUTH TWICE DAILY WITH A MEAL. 03/28/21 03/31/22  Virginia Crews, MD  Multiple Vitamin (MULTIVITAMIN WITH MINERALS) TABS tablet Take 1 tablet by mouth 2 (two) times a week.    [provider]  omeprazole (PRILOSEC) 20 MG capsule Take 1 capsule (20 mg total) by mouth daily. 03/28/21   Virginia Crews, MD  rosuvastatin (CRESTOR) 5 MG tablet Take 1 tablet (5 mg total) by mouth daily. 03/28/21   Virginia Crews, MD  tirzepatide [facility]) 2.5 MG/0.5ML Pen Inject 2.5 mg into the skin once a week. 10/08/21   Myles Gip, DO  vitamin B-12 (CYANOCOBALAMIN) 1000 MCG tablet Take 1 tablet (1,000 mcg total) by mouth daily. Patient taking differently: Take 1,000 mcg by mouth every evening. 03/21/19   Earlie Server, MD     Family History  Problem Relation Age of Onset   COPD Mother        heavy smoker   Pulmonary fibrosis Mother    Fibromyalgia Mother    COPD Father    Melanoma Father    COPD Sister    COPD Brother    COPD Sister    COPD Sister    Healthy Brother    Heart failure Maternal Grandmother    Heart attack Maternal Grandfather    Cancer Paternal Grandmother        unsure of type, had mets in colon   Heart attack Paternal Grandfather    Cancer Paternal Grandfather        unknown   Breast cancer Neg Hx    Cervical cancer Neg Hx     Social History   Socioeconomic History   Marital status: Married    Spouse name: Dance movement psychotherapist   Number of children: 0   Years of education: some college   Highest education level: Not on file  Occupational History   Occupation: CARE MANGEMENT    Employer: [employer]  Tobacco Use   Smoking status: Never   Smokeless tobacco: Never  Vaping  Use   Vaping Use: Never used  Substance and Sexual Activity   Alcohol use: No    Alcohol/week: 0.0 standard drinks of alcohol   Drug use: No   Sexual activity: Not Currently    Birth control/protection: Surgical  Other Topics Concern   Not on file  Social History Narrative   Not on file   Social Determinants of Health   Financial Resource Strain: Not on file  Food Insecurity: Not on file  Transportation Needs: Not on file  Physical Activity: Not on file  Stress: Not on file  Social Connections: Not on file    Review of Systems: A 12 point ROS discussed and pertinent positives are indicated in the HPI above.  All other systems are negative.  Review of Systems  Vital Signs: There were  no vitals taken for this visit.  Physical Exam  Imaging: No results found.  Labs:  CBC: Recent Labs    04/09/21 1604 06/02/21 1307 10/07/21 1454  WBC 4.1 4.3 3.7  HGB 12.0 13.7 12.9  HCT 35.5 41.6 37.3  PLT 86* 78* 70*    COAGS: No results for input(s): "INR", "APTT" in the last 8760 hours.  BMP: Recent Labs    04/09/21 1604 06/02/21 1307 10/07/21 1454  NA 138 135 140  K 4.4 4.6 4.5  CL 103 106 105  CO2 19* 20* 20  GLUCOSE 165* 158* 116*  BUN _0 CALCIUM 9.3 9.3 9.7  CREATININE 1.01* 1.01* 1.14*  GFRNONAA  --  >60  --     LIVER FUNCTION TESTS: Recent Labs    04/09/21 1604 06/02/21 1307 10/07/21 1454  BILITOT 0.6 0.8 1.2  AST 41* 41 38  ALT _1 ALKPHOS 152* 121 142*  PROT 7.0 7.8 7.0  ALBUMIN 4.1 4.0 4.3    TUMOR MARKERS: No results for input(s): "AFPTM", "CEA", "CA199", "CHROMGRNA" in the last 8760 hours.  Assessment and Plan:  Fatty liver/probably cirrhosis: Cindy Trujillo, 66 year old female, presents today to the [facility] Interventional Radiology department for an image-guided non-focal liver biopsy.  Risks and benefits of this procedure were discussed with the patient and/or patient's family including, but  not limited to bleeding, infection, damage to adjacent structures or low yield requiring additional tests.  All of the questions were answered and there is agreement to proceed. She has been NPO. She does not take any blood-thinning medications.   Consent signed and in chart.  Thank you for this interesting consult.  I greatly enjoyed meeting Cindy Trujillo and look forward to participating in their care.  A copy of this report was sent to the requesting provider on this date.  Electronically Signed: Soyla Dryer, AGACNP-BC [phone] 03/24/2022, 4:05 PM   I spent a total of  30 Minutes   in face to face in clinical consultation, greater than 50% of which was counseling/coordinating care for non-focal liver biopsy

## 2022-03-25 ENCOUNTER — Ambulatory Visit
Admission: RE | Admit: 2022-03-25 | Discharge: 2022-03-25 | Disposition: A | Discharge status: Home / Self Care | Payer: 59 | Source: Ambulatory Visit | Attending: Gastroenterology | Admitting: Gastroenterology

## 2022-03-25 ENCOUNTER — Other Ambulatory Visit: Payer: Self-pay

## 2022-03-25 DIAGNOSIS — K7469 Other cirrhosis of liver: Secondary | ICD-10-CM | POA: Diagnosis not present

## 2022-03-25 DIAGNOSIS — D649 Anemia, unspecified: Secondary | ICD-10-CM | POA: Insufficient documentation

## 2022-03-25 DIAGNOSIS — G510 Bell's palsy: Secondary | ICD-10-CM | POA: Diagnosis not present

## 2022-03-25 DIAGNOSIS — R7401 Elevation of levels of liver transaminase levels: Secondary | ICD-10-CM | POA: Diagnosis not present

## 2022-03-25 DIAGNOSIS — E119 Type 2 diabetes mellitus without complications: Secondary | ICD-10-CM | POA: Insufficient documentation

## 2022-03-25 DIAGNOSIS — C541 Malignant neoplasm of endometrium: Secondary | ICD-10-CM

## 2022-03-25 DIAGNOSIS — K7581 Nonalcoholic steatohepatitis (NASH): Secondary | ICD-10-CM | POA: Diagnosis not present

## 2022-03-25 DIAGNOSIS — E669 Obesity, unspecified: Secondary | ICD-10-CM | POA: Insufficient documentation

## 2022-03-25 DIAGNOSIS — I10 Essential (primary) hypertension: Secondary | ICD-10-CM | POA: Diagnosis not present

## 2022-03-25 DIAGNOSIS — D099 Carcinoma in situ, unspecified: Secondary | ICD-10-CM | POA: Insufficient documentation

## 2022-03-25 DIAGNOSIS — Z6835 Body mass index (BMI) 35.0-35.9, adult: Secondary | ICD-10-CM | POA: Diagnosis not present

## 2022-03-25 DIAGNOSIS — K760 Fatty (change of) liver, not elsewhere classified: Secondary | ICD-10-CM | POA: Diagnosis not present

## 2022-03-25 LAB — CBC
HCT: 35.2 % — ABNORMAL LOW (ref 36.0–46.0)
Hemoglobin: 11.8 g/dL — ABNORMAL LOW (ref 12.0–15.0)
MCH: 28.7 pg (ref 26.0–34.0)
MCHC: 33.5 g/dL (ref 30.0–36.0)
MCV: 85.6 fL (ref 80.0–100.0)
Platelets: 88 10*3/uL — ABNORMAL LOW (ref 150–400)
RBC: 4.11 MIL/uL (ref 3.87–5.11)
RDW: 13 % (ref 11.5–15.5)
WBC: 3.5 10*3/uL — ABNORMAL LOW (ref 4.0–10.5)
nRBC: 0 % (ref 0.0–0.2)

## 2022-03-25 LAB — PROTIME-INR
INR: 1.1 (ref 0.8–1.2)
Prothrombin Time: 14.5 seconds (ref 11.4–15.2)

## 2022-03-25 LAB — GLUCOSE, CAPILLARY: Glucose-Capillary: 189 mg/dL — ABNORMAL HIGH (ref 70–99)

## 2022-03-25 MED ORDER — SODIUM CHLORIDE 0.9 % IV SOLN
INTRAVENOUS | Status: AC | PRN
  Administered 2022-03-25: 100 mL/h via INTRAVENOUS

## 2022-03-25 MED ORDER — MIDAZOLAM HCL 2 MG/2ML IJ SOLN
INTRAMUSCULAR | Status: AC
  Filled 2022-03-25: qty 4

## 2022-03-25 MED ORDER — FENTANYL CITRATE (PF) 100 MCG/2ML IJ SOLN
INTRAMUSCULAR | Status: AC | PRN
  Administered 2022-03-25: 50 ug via INTRAVENOUS

## 2022-03-25 MED ORDER — LIDOCAINE HCL (PF) 1 % IJ SOLN
10.0000 mL | Freq: Once | INTRAMUSCULAR | Status: AC
  Administered 2022-03-25: 10 mL via INTRADERMAL
  Filled 2022-03-25: qty 10

## 2022-03-25 MED ORDER — FENTANYL CITRATE (PF) 100 MCG/2ML IJ SOLN
INTRAMUSCULAR | Status: AC
  Filled 2022-03-25: qty 2

## 2022-03-25 MED ORDER — SODIUM CHLORIDE 0.9 % IV SOLN: INTRAVENOUS | Status: DC

## 2022-03-25 MED ORDER — MIDAZOLAM HCL 2 MG/2ML IJ SOLN
INTRAMUSCULAR | Status: AC | PRN
  Administered 2022-03-25: 1 mg via INTRAVENOUS

## 2022-03-25 NOTE — Procedures (Signed)
Interventional Radiology Procedure Note  Date of Procedure: 03/25/2022  Procedure: [place] nonfocal liver biopsy   Findings:  1. [place] nonfocal liver biopsy 18ga x3 passes    Complications: No immediate complications noted.   Estimated Blood Loss: minimal  Follow-up and Recommendations: 1. Bedrest 2 hours, first hour right side down    Albin Felling, MD  Vascular & Interventional Radiology  03/25/2022 9:11 AM

## 2022-03-26 ENCOUNTER — Encounter: Payer: Self-pay | Admitting: Oncology

## 2022-03-27 LAB — SURGICAL PATHOLOGY

## 2022-03-28 ENCOUNTER — Encounter: Payer: Self-pay | Admitting: Oncology

## 2022-04-02 ENCOUNTER — Other Ambulatory Visit: Payer: Self-pay

## 2022-04-02 ENCOUNTER — Other Ambulatory Visit: Payer: Self-pay | Admitting: Family Medicine

## 2022-04-02 DIAGNOSIS — E1129 Type 2 diabetes mellitus with other diabetic kidney complication: Secondary | ICD-10-CM

## 2022-04-02 MED ORDER — FREESTYLE LITE TEST VI STRP
ORAL_STRIP | 0 refills | Status: DC
  Filled 2022-04-02: qty 100, 25d supply, fill #0

## 2022-04-02 MED ORDER — ROSUVASTATIN CALCIUM 5 MG PO TABS
5.0000 mg | ORAL_TABLET | Freq: Every day | ORAL | 0 refills | Status: DC
  Filled 2022-04-02: qty 90, 90d supply, fill #0

## 2022-04-02 MED ORDER — LISINOPRIL 5 MG PO TABS
ORAL_TABLET | Freq: Every day | ORAL | 0 refills | Status: DC
  Filled 2022-04-02: qty 90, 90d supply, fill #0

## 2022-04-02 MED ORDER — DAPAGLIFLOZIN PROPANEDIOL 10 MG PO TABS
10.0000 mg | ORAL_TABLET | Freq: Every day | ORAL | 0 refills | Status: DC
  Filled 2022-04-02: qty 90, 90d supply, fill #0

## 2022-04-02 MED ORDER — OMEPRAZOLE 20 MG PO CPDR
20.0000 mg | DELAYED_RELEASE_CAPSULE | Freq: Every day | ORAL | 0 refills | Status: DC
  Filled 2022-04-02: qty 90, 90d supply, fill #0

## 2022-04-02 NOTE — Telephone Encounter (Signed)
Requested Prescriptions  Pending Prescriptions Disp Refills   dapagliflozin propanediol (FARXIGA) 10 MG TABS tablet 90 tablet 0    Sig: Take 1 tablet (10 mg total) by mouth daily.     Endocrinology:  Diabetes - SGLT2 Inhibitors Failed - 04/02/2022 10:43 AM      Failed - Cr in normal range and within 360 days    Creat  Date Value Ref Range Status  01/15/2017 0.81 0.50 - 1.05 mg/dL Final    Comment:    For patients >64 years of age, the reference limit for Creatinine is approximately 13% higher for people identified as African-American. .    Creatinine, Ser  Date Value Ref Range Status  10/07/2021 1.14 (H) 0.57 - 1.00 mg/dL Final         Failed - HBA1C is between 0 and 7.9 and within 180 days    Hgb A1c MFr Bld  Date Value Ref Range Status  10/07/2021 8.9 (H) 4.8 - 5.6 % Final    Comment:             Prediabetes: 5.7 - 6.4          Diabetes: >6.4          Glycemic control for adults with diabetes: <7.0          Failed - eGFR in normal range and within 360 days    GFR, Est African American  Date Value Ref Range Status  01/15/2017 92 > OR = 60 mL/min/1.35m Final   GFR calc Af Amer  Date Value Ref Range Status  12/06/2019 57 (L) >60 mL/min Final   GFR, Est Non African American  Date Value Ref Range Status  01/15/2017 79 > OR = 60 mL/min/1.763mFinal   GFR, Estimated  Date Value Ref Range Status  06/02/2021 >60 >60 mL/min Final    Comment:    (NOTE) Calculated using the CKD-EPI Creatinine Equation (2021)    eGFR  Date Value Ref Range Status  10/07/2021 54 (L) >59 mL/min/1.73 Final         Passed - Valid encounter within last 6 months    Recent Outpatient Visits           2 weeks ago Upper respiratory tract infection, unspecified type   [facility] [place]   5 months ago Annual physical exam   BuAT&TAlJake ChurchDO   1 year ago Type 2 diabetes mellitus with microalbuminuria, without long-term current  use of insulin ([facility]  [facility]   1 year ago Bell's palsy   BuShidlerAnDionne BucyMD   1 year ago Annual physical exam   Bu[place]PA-C       Future Appointments             In 2 weeks Bacigalupo, AnDionne BucyMD [facility]             lisinopril (ZESTRIL) 5 MG tablet 90 tablet 0    Sig: TAKE 1 TABLET (5 MG TOTAL) BY MOUTH DAILY.     Cardiovascular:  ACE Inhibitors Failed - 04/02/2022 10:43 AM      Failed - Cr in normal range and within 180 days    Creat  Date Value Ref Range Status  01/15/2017 0.81 0.50 - 1.05 mg/dL Final    Comment:    For patients >4922ears of age, the reference limit  for Creatinine is approximately 13% higher for people identified as African-American. .    Creatinine, Ser  Date Value Ref Range Status  10/07/2021 1.14 (H) 0.57 - 1.00 mg/dL Final         Passed - K in normal range and within 180 days    Potassium  Date Value Ref Range Status  10/07/2021 4.5 3.5 - 5.2 mmol/L Final         Passed - Patient is not pregnant      Passed - Last BP in normal range    BP Readings from Last 1 Encounters:  03/25/22 (!) 101/58         Passed - Valid encounter within last 6 months    Recent Outpatient Visits           2 weeks ago Upper respiratory tract infection, unspecified type   [facility] Thedore Mins, [place], PA-C   5 months ago Annual physical exam   [facility] Myles Gip, DO   1 year ago Type 2 diabetes mellitus with microalbuminuria, without long-term current use of insulin [facility])   [facility], Dionne Bucy, MD   1 year ago Bell's palsy   [facility] [place], Dionne Bucy, MD   1 year ago Annual physical exam   [place], PA-C       Future Appointments             In 2 weeks Bacigalupo, Dionne Bucy, MD  [facility], [facility]             rosuvastatin (CRESTOR) 5 MG tablet 90 tablet 0    Sig: Take 1 tablet (5 mg total) by mouth daily.     Cardiovascular:  Antilipid - Statins 2 Failed - 04/02/2022 10:43 AM      Failed - Cr in normal range and within 360 days    Creat  Date Value Ref Range Status  01/15/2017 0.81 0.50 - 1.05 mg/dL Final    Comment:    For patients >33 years of age, the reference limit for Creatinine is approximately 13% higher for people identified as African-American. .    Creatinine, Ser  Date Value Ref Range Status  10/07/2021 1.14 (H) 0.57 - 1.00 mg/dL Final         Failed - Lipid Panel in normal range within the last 12 months    Cholesterol, Total  Date Value Ref Range Status  10/07/2021 161 100 - 199 mg/dL Final   LDL Cholesterol (Calc)  Date Value Ref Range Status  01/15/2017 119 (H) mg/dL (calc) Final    Comment:    Reference range: <100 . Desirable range <100 mg/dL for primary prevention;   <70 mg/dL for patients with CHD or diabetic patients  with > or = 2 CHD risk factors. Marland Kitchen LDL-C is now calculated using the Martin-Hopkins  calculation, which is a validated novel method providing  better accuracy than the Friedewald equation in the  estimation of LDL-C.  Cresenciano Genre et al. Annamaria Helling. 8657;846(96): 2061-2068  (http://education.Quest[employer]ostics.com/faq/FAQ164)    LDL Chol Calc (NIH)  Date Value Ref Range Status  10/07/2021 77 0 - 99 mg/dL Final   HDL  Date Value Ref Range Status  10/07/2021 53 >39 mg/dL Final   Triglycerides  Date Value Ref Range Status  10/07/2021 184 (H) 0 - 149 mg/dL Final         Passed - Patient is not pregnant  Passed - Valid encounter within last 12 months    Recent Outpatient Visits           2 weeks ago Upper respiratory tract infection, unspecified type   [facility] Thedore Mins, [place], PA-C   5 months ago Annual physical exam   [facility] Myles Gip,  DO   1 year ago Type 2 diabetes mellitus with microalbuminuria, without long-term current use of insulin [facility])   [facility], Dionne Bucy, MD   1 year ago Bell's palsy   [facility] [place], Dionne Bucy, MD   1 year ago Annual physical exam   [place], PA-C       Future Appointments             In 2 weeks Bacigalupo, Dionne Bucy, MD [facility], [facility]             omeprazole (PRILOSEC) 20 MG capsule 90 capsule 0    Sig: Take 1 capsule (20 mg total) by mouth daily.     Gastroenterology: Proton Pump Inhibitors Passed - 04/02/2022 10:43 AM      Passed - Valid encounter within last 12 months    Recent Outpatient Visits           2 weeks ago Upper respiratory tract infection, unspecified type   [facility] Thedore Mins, [place], PA-C   5 months ago Annual physical exam   [facility] Myles Gip, DO   1 year ago Type 2 diabetes mellitus with microalbuminuria, without long-term current use of insulin [facility])   [facility], Dionne Bucy, MD   1 year ago Bell's palsy   [facility], Dionne Bucy, MD   1 year ago Annual physical exam   [place], Vickki Muff, PA-C       Future Appointments             In 2 weeks Bacigalupo, Dionne Bucy, MD [facility], [facility]             glucose blood (FREESTYLE LITE) test strip 100 each 0    Sig: Use as directed up to four times a day     Endocrinology: Diabetes - Testing Supplies Passed - 04/02/2022 10:43 AM      Passed - Valid encounter within last 12 months    Recent Outpatient Visits           2 weeks ago Upper respiratory tract infection, unspecified type   [facility] Thedore Mins, [place], PA-C   5 months ago Annual physical exam   [facility] Myles Gip, DO   1 year ago Type 2 diabetes mellitus with  microalbuminuria, without long-term current use of insulin [facility])   [facility], Dionne Bucy, MD   1 year ago [place], Dionne Bucy, MD   1 year ago Annual physical exam   [place], PA-C       Future Appointments             In 2 weeks Bacigalupo, Dionne Bucy, MD [facility], [place]

## 2022-04-07 ENCOUNTER — Other Ambulatory Visit: Payer: Self-pay

## 2022-04-07 MED ORDER — COVID-19 MRNA VAC-TRIS(PFIZER) 30 MCG/0.3ML IM SUSY
PREFILLED_SYRINGE | INTRAMUSCULAR | 0 refills | Status: DC
  Filled 2022-04-09: qty 0.3, 1d supply, fill #0

## 2022-04-08 DIAGNOSIS — E119 Type 2 diabetes mellitus without complications: Secondary | ICD-10-CM | POA: Diagnosis not present

## 2022-04-08 LAB — HM DIABETES EYE EXAM

## 2022-04-09 ENCOUNTER — Other Ambulatory Visit: Payer: Self-pay

## 2022-04-10 ENCOUNTER — Encounter: Payer: Self-pay | Admitting: Physician Assistant

## 2022-04-10 ENCOUNTER — Encounter: Payer: Self-pay | Admitting: Family Medicine

## 2022-04-10 ENCOUNTER — Other Ambulatory Visit: Payer: Self-pay

## 2022-04-10 ENCOUNTER — Other Ambulatory Visit (HOSPITAL_BASED_OUTPATIENT_CLINIC_OR_DEPARTMENT_OTHER): Payer: Self-pay

## 2022-04-10 ENCOUNTER — Other Ambulatory Visit: Payer: Self-pay | Admitting: Family Medicine

## 2022-04-10 MED ORDER — OLOPATADINE HCL 0.2 % OP SOLN
1.0000 [drp] | Freq: Four times a day (QID) | OPHTHALMIC | 0 refills | Status: DC | PRN
  Filled 2022-04-10: qty 2.5, 13d supply, fill #0

## 2022-04-10 MED ORDER — AMOXICILLIN-POT CLAVULANATE 875-125 MG PO TABS
1.0000 | ORAL_TABLET | Freq: Two times a day (BID) | ORAL | 0 refills | Status: DC
  Filled 2022-04-10: qty 20, 10d supply, fill #0

## 2022-04-10 NOTE — Progress Notes (Unsigned)
Established patient visit   Patient: Cindy Trujillo   DOB: 08/02/1955   66 y.o. Female  MRN: [id number] Visit Date: 04/16/2022  Today's healthcare provider: Lavon Paganini, MD   No chief complaint on file.  Subjective    HPI  Diabetes Mellitus Type II, follow-up  Lab Results  Component Value Date   HGBA1C 8.9 (H) 10/07/2021   HGBA1C 7.6 (A) 03/28/2021   HGBA1C 7.8 (H) 10/04/2020   Last seen for diabetes 6 months ago.  Management since then includes continuing the same treatment. She reports {excellent/good/fair/poor:19665} compliance with treatment. She {is/is not:21021397} having side effects. {document side effects if present:1}  Home blood sugar records: {diabetes glucometry results:16657}  Episodes of hypoglycemia? {Yes/No:20286} {enter details if yes:1}   Current insulin regiment: none Most Recent Eye Exam: UTD  -------------------------------------------------------------------------------------------------- Lipid/Cholesterol, follow-up  Last Lipid Panel: Lab Results  Component Value Date   CHOL 161 10/07/2021   LDLCALC 77 10/07/2021   HDL 53 10/07/2021   TRIG 184 (H) 10/07/2021    She was last seen for this 6 months ago.  Management since that visit includes no changes.  She reports {excellent/good/fair/poor:19665} compliance with treatment. She {is/is not:9024} having side effects. {document side effects if WERXVQM:0}  Last metabolic panel Lab Results  Component Value Date   GLUCOSE 116 (H) 10/07/2021   NA 140 10/07/2021   K 4.5 10/07/2021   BUN 14 10/07/2021   CREATININE 1.14 (H) 10/07/2021   EGFR 54 (L) 10/07/2021   GFRNONAA >60 06/02/2021   CALCIUM 9.7 10/07/2021   AST 38 10/07/2021   ALT 25 10/07/2021   The 10-year ASCVD risk score (Arnett DK, et al., 2019) is: 5.5%  ---------------------------------------------------------------------------------------------------   Medications: Outpatient Medications Prior to Visit   Medication Sig   amoxicillin-clavulanate (AUGMENTIN) 875-125 MG tablet Take 1 tablet by mouth 2 (two) times daily.   artificial tears (LACRILUBE) OINT ophthalmic ointment Place into the left eye at bedtime as needed for dry eyes.   aspirin EC 81 MG tablet Take 1 tablet (81 mg total) by mouth daily. Swallow whole.   blood glucose meter kit and supplies Dispense based on patient and insurance preference. Use up to four times daily as directed. (FOR ICD-10 E10.9, E11.9).   Blood Glucose Monitoring Suppl (FREESTYLE LITE) DEVI    COVID-19 mRNA vaccine 2023-2024 (COMIRNATY) syringe Inject into the muscle.   dapagliflozin propanediol (FARXIGA) 10 MG TABS tablet Take 1 tablet (10 mg total) by mouth daily.   ferrous sulfate 325 (65 FE) MG tablet TAKE 1 TABLET BY MOUTH TWICE DAILY   FreeStyle Unistick II Lancets MISC Use as instructed to check blood glucose daily   glucose blood (FREESTYLE LITE) test strip Use as directed up to four times a day   glucose blood (FREESTYLE TEST STRIPS) test strip Use as instructed to check blood glucose daily   glucose monitoring kit (FREESTYLE) monitoring kit 1 each by Does not apply route as needed for other.   Lancets (FREESTYLE) lancets    Lancets (FREESTYLE) lancets Use as directed up to four times a day   lisinopril (ZESTRIL) 5 MG tablet TAKE 1 TABLET (5 MG TOTAL) BY MOUTH DAILY.   loratadine (CLARITIN) 10 MG tablet Take 1 tablet (10 mg total) by mouth daily.   Magnesium 400 MG TABS Take 1 tablet by mouth daily.   metFORMIN (GLUCOPHAGE) 1000 MG tablet TAKE 1 TABLET BY MOUTH TWICE DAILY WITH A MEAL.   Multiple Vitamin (MULTIVITAMIN WITH MINERALS) TABS  tablet Take 1 tablet by mouth 2 (two) times a week.   Olopatadine HCl 0.2 % SOLN Apply 1 drop to eye 4 (four) times daily as needed.   omeprazole (PRILOSEC) 20 MG capsule Take 1 capsule (20 mg total) by mouth daily.   rosuvastatin (CRESTOR) 5 MG tablet Take 1 tablet (5 mg total) by mouth daily.   tirzepatide  [facility]) 2.5 MG/0.5ML Pen Inject 2.5 mg into the skin once a week. (Patient not taking: Reported on 03/25/2022)   vitamin B-12 (CYANOCOBALAMIN) 1000 MCG tablet Take 1 tablet (1,000 mcg total) by mouth daily. (Patient taking differently: Take 1,000 mcg by mouth every evening.)   No facility-administered medications prior to visit.    Review of Systems  Constitutional:  Negative for appetite change and fatigue.  Eyes:  Negative for visual disturbance.  Respiratory:  Negative for chest tightness and shortness of breath.   Cardiovascular:  Negative for chest pain and leg swelling.  Gastrointestinal:  Negative for abdominal pain and nausea.  Neurological:  Negative for dizziness, light-headedness and headaches.    {Labs  Heme  Chem  Endocrine  Serology  Results Review (optional):23779}   Objective    There were no vitals taken for this visit. BP Readings from Last 3 Encounters:  03/25/22 (!) 101/58  03/19/22 (!) 110/54  03/04/22 121/65   Wt Readings from Last 3 Encounters:  03/25/22 207 lb (93.9 kg)  03/19/22 209 lb 3.2 oz (94.9 kg)  03/04/22 211 lb 6.4 oz (95.9 kg)      Physical Exam  ***  No results found for any visits on 04/16/22.  Assessment & Plan     ***  No follow-ups on file.      {provider attestation***:1}   Lavon Paganini, MD  [facility] [phone] (phone) [phone] (fax)  [place]

## 2022-04-14 ENCOUNTER — Other Ambulatory Visit: Payer: Self-pay

## 2022-04-16 ENCOUNTER — Ambulatory Visit (INDEPENDENT_AMBULATORY_CARE_PROVIDER_SITE_OTHER): Payer: Commercial Managed Care - PPO | Admitting: Family Medicine

## 2022-04-16 ENCOUNTER — Other Ambulatory Visit: Payer: Self-pay

## 2022-04-16 ENCOUNTER — Encounter: Payer: Self-pay | Admitting: Family Medicine

## 2022-04-16 ENCOUNTER — Encounter: Payer: Self-pay | Admitting: Oncology

## 2022-04-16 VITALS — BP 106/56 | HR 75 | Temp 98.5°F | Resp 16 | Ht 64.0 in | Wt 213.0 lb

## 2022-04-16 DIAGNOSIS — E1169 Type 2 diabetes mellitus with other specified complication: Secondary | ICD-10-CM

## 2022-04-16 DIAGNOSIS — E559 Vitamin D deficiency, unspecified: Secondary | ICD-10-CM

## 2022-04-16 DIAGNOSIS — E1129 Type 2 diabetes mellitus with other diabetic kidney complication: Secondary | ICD-10-CM

## 2022-04-16 DIAGNOSIS — E785 Hyperlipidemia, unspecified: Secondary | ICD-10-CM

## 2022-04-16 DIAGNOSIS — R809 Proteinuria, unspecified: Secondary | ICD-10-CM

## 2022-04-16 MED ORDER — OMEPRAZOLE 20 MG PO CPDR
20.0000 mg | DELAYED_RELEASE_CAPSULE | Freq: Every day | ORAL | 3 refills | Status: DC
  Filled 2022-04-16 – 2022-06-12 (×2): qty 90, 90d supply, fill #0
  Filled 2022-11-26: qty 90, 90d supply, fill #1
  Filled 2023-02-22: qty 90, 90d supply, fill #2

## 2022-04-16 MED ORDER — LISINOPRIL 5 MG PO TABS
5.0000 mg | ORAL_TABLET | Freq: Every day | ORAL | 3 refills | Status: DC
  Filled 2022-04-16 – 2022-08-17 (×2): qty 90, 90d supply, fill #0

## 2022-04-16 MED ORDER — FREESTYLE TEST VI STRP
ORAL_STRIP | 12 refills | Status: DC
  Filled 2022-04-16: qty 100, 25d supply, fill #0
  Filled 2022-08-17: qty 100, 25d supply, fill #1
  Filled 2022-11-26: qty 100, 25d supply, fill #2
  Filled 2023-02-22: qty 100, 25d supply, fill #3

## 2022-04-16 MED ORDER — DAPAGLIFLOZIN PROPANEDIOL 10 MG PO TABS
10.0000 mg | ORAL_TABLET | Freq: Every day | ORAL | 3 refills | Status: DC
  Filled 2022-04-16: qty 90, 90d supply, fill #0
  Filled 2022-06-12: qty 30, 30d supply, fill #0
  Filled 2022-06-12: qty 90, 90d supply, fill #0
  Filled 2022-08-17: qty 30, 30d supply, fill #1
  Filled 2022-09-04: qty 90, 90d supply, fill #1

## 2022-04-16 MED ORDER — ROSUVASTATIN CALCIUM 5 MG PO TABS
5.0000 mg | ORAL_TABLET | Freq: Every day | ORAL | 3 refills | Status: DC
  Filled 2022-04-16 – 2022-06-12 (×2): qty 90, 90d supply, fill #0

## 2022-04-16 MED ORDER — METFORMIN HCL 1000 MG PO TABS
ORAL_TABLET | Freq: Two times a day (BID) | ORAL | 3 refills | Status: DC
  Filled 2022-04-16: qty 180, 90d supply, fill #0
  Filled 2022-08-17: qty 180, 90d supply, fill #1

## 2022-04-16 NOTE — Assessment & Plan Note (Signed)
Previously uncontrolled with hyperglycemia Recheck A1c, UACR Continue current medications UTD on vaccines, eye exam, foot exam On ACEi/ARB On Statin Discussed diet and exercise F/u in 6 months

## 2022-04-16 NOTE — Assessment & Plan Note (Signed)
Resume supplement Feeling more fatigued Recheck level

## 2022-04-16 NOTE — Assessment & Plan Note (Signed)
Continue ACEi Recheck UACR

## 2022-04-16 NOTE — Assessment & Plan Note (Signed)
Previously well controlled Continue statin - discussed benefits and risk reduction even in the setting of NASH (new diagnosis) Repeat FLP and CMP Goal LDL < 70

## 2022-04-22 ENCOUNTER — Other Ambulatory Visit: Payer: Self-pay

## 2022-04-24 ENCOUNTER — Other Ambulatory Visit: Payer: Self-pay

## 2022-04-24 MED ORDER — COMIRNATY 30 MCG/0.3ML IM SUSY
0.3000 mL | PREFILLED_SYRINGE | Freq: Once | INTRAMUSCULAR | 0 refills | Status: AC
  Filled 2022-04-27: qty 0.3, 1d supply, fill #0

## 2022-04-27 ENCOUNTER — Other Ambulatory Visit: Payer: Self-pay

## 2022-04-27 DIAGNOSIS — E1169 Type 2 diabetes mellitus with other specified complication: Secondary | ICD-10-CM | POA: Diagnosis not present

## 2022-04-27 DIAGNOSIS — E559 Vitamin D deficiency, unspecified: Secondary | ICD-10-CM | POA: Diagnosis not present

## 2022-04-27 DIAGNOSIS — E1129 Type 2 diabetes mellitus with other diabetic kidney complication: Secondary | ICD-10-CM | POA: Diagnosis not present

## 2022-04-27 DIAGNOSIS — E785 Hyperlipidemia, unspecified: Secondary | ICD-10-CM | POA: Diagnosis not present

## 2022-04-27 DIAGNOSIS — R809 Proteinuria, unspecified: Secondary | ICD-10-CM | POA: Diagnosis not present

## 2022-04-28 ENCOUNTER — Inpatient Hospital Stay: Payer: Commercial Managed Care - PPO | Attending: Oncology

## 2022-04-28 DIAGNOSIS — K760 Fatty (change of) liver, not elsewhere classified: Secondary | ICD-10-CM | POA: Diagnosis not present

## 2022-04-28 DIAGNOSIS — Z8542 Personal history of malignant neoplasm of other parts of uterus: Secondary | ICD-10-CM | POA: Diagnosis not present

## 2022-04-28 DIAGNOSIS — Z7982 Long term (current) use of aspirin: Secondary | ICD-10-CM | POA: Insufficient documentation

## 2022-04-28 DIAGNOSIS — Z79899 Other long term (current) drug therapy: Secondary | ICD-10-CM | POA: Diagnosis not present

## 2022-04-28 DIAGNOSIS — Z90710 Acquired absence of both cervix and uterus: Secondary | ICD-10-CM | POA: Diagnosis not present

## 2022-04-28 DIAGNOSIS — E119 Type 2 diabetes mellitus without complications: Secondary | ICD-10-CM | POA: Diagnosis not present

## 2022-04-28 DIAGNOSIS — D6851 Activated protein C resistance: Secondary | ICD-10-CM | POA: Insufficient documentation

## 2022-04-28 DIAGNOSIS — D696 Thrombocytopenia, unspecified: Secondary | ICD-10-CM | POA: Diagnosis not present

## 2022-04-28 DIAGNOSIS — Z86718 Personal history of other venous thrombosis and embolism: Secondary | ICD-10-CM | POA: Diagnosis not present

## 2022-04-28 DIAGNOSIS — Z90721 Acquired absence of ovaries, unilateral: Secondary | ICD-10-CM | POA: Insufficient documentation

## 2022-04-28 DIAGNOSIS — E785 Hyperlipidemia, unspecified: Secondary | ICD-10-CM | POA: Insufficient documentation

## 2022-04-28 DIAGNOSIS — Z7984 Long term (current) use of oral hypoglycemic drugs: Secondary | ICD-10-CM | POA: Insufficient documentation

## 2022-04-28 LAB — CBC WITH DIFFERENTIAL/PLATELET
Abs Immature Granulocytes: 0.01 10*3/uL (ref 0.00–0.07)
Basophils Absolute: 0 10*3/uL (ref 0.0–0.1)
Basophils Relative: 0 %
Eosinophils Absolute: 0.1 10*3/uL (ref 0.0–0.5)
Eosinophils Relative: 3 %
HCT: 36.9 % (ref 36.0–46.0)
Hemoglobin: 12.2 g/dL (ref 12.0–15.0)
Immature Granulocytes: 0 %
Lymphocytes Relative: 31 %
Lymphs Abs: 1 10*3/uL (ref 0.7–4.0)
MCH: 29.1 pg (ref 26.0–34.0)
MCHC: 33.1 g/dL (ref 30.0–36.0)
MCV: 88.1 fL (ref 80.0–100.0)
Monocytes Absolute: 0.3 10*3/uL (ref 0.1–1.0)
Monocytes Relative: 8 %
Neutro Abs: 1.9 10*3/uL (ref 1.7–7.7)
Neutrophils Relative %: 58 %
Platelets: 66 10*3/uL — ABNORMAL LOW (ref 150–400)
RBC: 4.19 MIL/uL (ref 3.87–5.11)
RDW: 13.6 % (ref 11.5–15.5)
WBC: 3.3 10*3/uL — ABNORMAL LOW (ref 4.0–10.5)
nRBC: 0 % (ref 0.0–0.2)

## 2022-04-28 LAB — HEMOGLOBIN A1C
Est. average glucose Bld gHb Est-mCnc: 206 mg/dL
Hgb A1c MFr Bld: 8.8 % — ABNORMAL HIGH (ref 4.8–5.6)

## 2022-04-28 LAB — COMPREHENSIVE METABOLIC PANEL
ALT: 26 IU/L (ref 0–32)
ALT: 23 U/L (ref 0–44)
AST: 43 IU/L — ABNORMAL HIGH (ref 0–40)
AST: 43 U/L — ABNORMAL HIGH (ref 15–41)
Albumin/Globulin Ratio: 1.2 (ref 1.2–2.2)
Albumin: 4 g/dL (ref 3.9–4.9)
Albumin: 3.7 g/dL (ref 3.5–5.0)
Alkaline Phosphatase: 157 IU/L — ABNORMAL HIGH (ref 44–121)
Alkaline Phosphatase: 127 U/L — ABNORMAL HIGH (ref 38–126)
Anion gap: 10 (ref 5–15)
BUN/Creatinine Ratio: 16 (ref 12–28)
BUN: 19 mg/dL (ref 8–27)
BUN: 22 mg/dL (ref 8–23)
Bilirubin Total: 1.3 mg/dL — ABNORMAL HIGH (ref 0.0–1.2)
CO2: 19 mmol/L — ABNORMAL LOW (ref 20–29)
CO2: 21 mmol/L — ABNORMAL LOW (ref 22–32)
Calcium: 9.7 mg/dL (ref 8.7–10.3)
Calcium: 8.9 mg/dL (ref 8.9–10.3)
Chloride: 102 mmol/L (ref 96–106)
Chloride: 104 mmol/L (ref 98–111)
Creatinine, Ser: 1.16 mg/dL — ABNORMAL HIGH (ref 0.57–1.00)
Creatinine, Ser: 1.13 mg/dL — ABNORMAL HIGH (ref 0.44–1.00)
GFR, Estimated: 54 mL/min — ABNORMAL LOW (ref >60)
Globulin, Total: 3.3 g/dL (ref 1.5–4.5)
Glucose, Bld: 157 mg/dL — ABNORMAL HIGH (ref 70–99)
Glucose: 153 mg/dL — ABNORMAL HIGH (ref 70–99)
Potassium: 4.3 mmol/L (ref 3.5–5.2)
Potassium: 4.4 mmol/L (ref 3.5–5.1)
Sodium: 140 mmol/L (ref 134–144)
Sodium: 135 mmol/L (ref 135–145)
Total Bilirubin: 1.1 mg/dL (ref 0.3–1.2)
Total Protein: 7.3 g/dL (ref 6.0–8.5)
Total Protein: 7.9 g/dL (ref 6.5–8.1)
eGFR: 53 mL/min/{1.73_m2} — ABNORMAL LOW (ref >59)

## 2022-04-28 LAB — IRON AND TIBC
Iron: 71 ug/dL (ref 28–170)
Saturation Ratios: 20 % (ref 10.4–31.8)
TIBC: 363 ug/dL (ref 250–450)
UIBC: 292 ug/dL

## 2022-04-28 LAB — VITAMIN D 25 HYDROXY (VIT D DEFICIENCY, FRACTURES): Vit D, 25-Hydroxy: 16.5 ng/mL — ABNORMAL LOW (ref 30.0–100.0)

## 2022-04-28 LAB — MICROALBUMIN / CREATININE URINE RATIO
Creatinine, Urine: 76.5 mg/dL
Microalb/Creat Ratio: 5 mg/g creat (ref 0–29)
Microalbumin, Urine: 3.9 ug/mL

## 2022-04-28 LAB — LIPID PANEL WITH LDL/HDL RATIO
Cholesterol, Total: 151 mg/dL (ref 100–199)
HDL: 53 mg/dL (ref >39)
LDL Chol Calc (NIH): 64 mg/dL (ref 0–99)
LDL/HDL Ratio: 1.2 ratio (ref 0.0–3.2)
Triglycerides: 210 mg/dL — ABNORMAL HIGH (ref 0–149)
VLDL Cholesterol Cal: 34 mg/dL (ref 5–40)

## 2022-04-28 LAB — VITAMIN B12: Vitamin B-12: 1299 pg/mL — ABNORMAL HIGH (ref 180–914)

## 2022-04-28 LAB — FERRITIN: Ferritin: 61 ng/mL (ref 11–307)

## 2022-04-28 LAB — IMMATURE PLATELET FRACTION: Immature Platelet Fraction: 2.4 % (ref 1.2–8.6)

## 2022-04-30 ENCOUNTER — Encounter: Payer: Self-pay | Admitting: Oncology

## 2022-04-30 ENCOUNTER — Inpatient Hospital Stay: Payer: Commercial Managed Care - PPO | Admitting: Oncology

## 2022-04-30 VITALS — BP 124/66 | HR 84 | Temp 97.1°F | Resp 18 | Wt 213.5 lb

## 2022-04-30 DIAGNOSIS — K760 Fatty (change of) liver, not elsewhere classified: Secondary | ICD-10-CM | POA: Diagnosis not present

## 2022-04-30 DIAGNOSIS — Z79899 Other long term (current) drug therapy: Secondary | ICD-10-CM | POA: Diagnosis not present

## 2022-04-30 DIAGNOSIS — Z86718 Personal history of other venous thrombosis and embolism: Secondary | ICD-10-CM | POA: Diagnosis not present

## 2022-04-30 DIAGNOSIS — C541 Malignant neoplasm of endometrium: Secondary | ICD-10-CM

## 2022-04-30 DIAGNOSIS — D696 Thrombocytopenia, unspecified: Secondary | ICD-10-CM | POA: Diagnosis not present

## 2022-04-30 DIAGNOSIS — Z7984 Long term (current) use of oral hypoglycemic drugs: Secondary | ICD-10-CM | POA: Diagnosis not present

## 2022-04-30 DIAGNOSIS — D6851 Activated protein C resistance: Secondary | ICD-10-CM | POA: Diagnosis not present

## 2022-04-30 DIAGNOSIS — E119 Type 2 diabetes mellitus without complications: Secondary | ICD-10-CM | POA: Diagnosis not present

## 2022-04-30 DIAGNOSIS — E785 Hyperlipidemia, unspecified: Secondary | ICD-10-CM | POA: Diagnosis not present

## 2022-04-30 DIAGNOSIS — Z8542 Personal history of malignant neoplasm of other parts of uterus: Secondary | ICD-10-CM | POA: Diagnosis not present

## 2022-04-30 DIAGNOSIS — Z7982 Long term (current) use of aspirin: Secondary | ICD-10-CM | POA: Diagnosis not present

## 2022-04-30 NOTE — Assessment & Plan Note (Signed)
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

## 2022-04-30 NOTE — Assessment & Plan Note (Signed)
S/p US liver biopsy. Pathology was reviewed with patient.

## 2022-04-30 NOTE — Assessment & Plan Note (Addendum)
Chronic thrombocytopenia, previously work up was not remarkable. Bone marrow biopsy was previously done.  Normal cytogenetics and MDS FISH panel. decreased marrow production, possible due to low-grade MDS, or chronic liver disease  Inappropriate normal immature platelet fraction, not consistent with peripheral consumption ie ITP.  Continue observation

## 2022-04-30 NOTE — Assessment & Plan Note (Signed)
History of DVT, and heterozygous factor V leiden mutation.  Off Eliquis due to easy bruising.  Continue aspirin 81 mg for prophylaxis.

## 2022-04-30 NOTE — Progress Notes (Signed)
Hematology/Oncology Progress note [phone] [phone]   CHIEF COMPLAINTS/REASON FOR VISIT:  thrombocytopenia  ASSESSMENT & PLAN:   Endometrial cancer (HCC) stage IA, Loss of MLH1 No LVI, continue follow-up with with GYN oncology alternating with GYN for pelvic examination every 6 months.  Thrombocytopenia ([place]) Chronic thrombocytopenia, previously work up was not remarkable. Bone marrow biopsy was previously done.  Normal cytogenetics and MDS FISH panel. decreased marrow production, possible due to low-grade MDS, or chronic liver disease  Inappropriate normal immature platelet fraction, not consistent with peripheral consumption ie ITP.  Continue observation  Fatty liver disease, nonalcoholic S/p US liver biopsy. Pathology was reviewed with patient.    History of DVT (deep vein thrombosis) History of DVT, and heterozygous factor V leiden mutation.  Off Eliquis due to easy bruising.  Continue aspirin 81 mg for prophylaxis.   Orders Placed This Encounter  Procedures   CBC with Differential/Platelet    Standing Status:   Future    Standing Expiration Date:   05/01/2023   Comprehensive metabolic panel    Standing Status:   Future    Standing Expiration Date:   04/30/2023   Immature Platelet Fraction    Standing Status:   Future    Standing Expiration Date:   05/01/2023   Follow up in 6 months All questions were answered. The patient knows to call the clinic with any problems, questions or concerns.  Cindy Server, MD, PhD [facility] Hematology Oncology 04/30/2022   HISTORY OF PRESENTING ILLNESS:   Cindy Trujillo is a  67 y.o.  female with PMH listed below was seen in consultation at the request of  Cindy Trujillo  for evaluation of,  Thrombophilia work-up.    Patient was previously seen by me for thrombocytopenia.  She was last seen by me on 03/21/2019.  She lost follow-up. Patient was recently evaluated by gynecology for postmenopausal  bleeding. Patient has had pelvic ultrasound and also 10/31/2019 she underwent D&C with MyoSure. Pathology showed endometrial adenocarcinoma, endometrioid type, FIGO grade 1.  Background EIN/atypical hyperplasia. Patient was seen by Cindy Trujillo and recommend surgical treatment with hysterectomy and bilateral salpingoophorectomy with pelvic washing, pelvic para-aortic lymph node sampling and sentinel lymph node biopsy. Cindy Trujillo recommends the patient reestablish care with me for thrombophilia work-up.  Patient is at risk of developing thrombosis perioperatively given the type of procedure, history of DVT, malignancy.   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  She has been on Eliquis 2.5 mg twice daily.  She was advised by primary care provider to cut 5 mg tablets to half for 2.5 mg twice daily.   Managed by primary care provider.  DVT was found incidentally to her procedure for varicose vein.  For her chronic thrombocytopenia, her initial work-up was nonremarkable except borderline vitamin B12 level.  She was recommended to observe blood counts and if further worsening of thrombocytopenia, I recommend bone marrow biopsy.  She lost follow-up since December 2020. Ultrasound liver showed coarse hepatic echo Texture suggesting fatty liver disease.  12/06/2019 underwent  hysterectomy with oophorectomy.  Pathology showed endometrioid endometiral adenocarcinoma, low grade, Figo 1, invasive 35m/16mm of total myometrial thickness. No lymph node involvement. pT1a pN0.   # COVID-19 infection in February 2022.   #11/01/2020 bone marrow biopsy results were reviewed and discussed with patient.  She has normocellular marrow with trilineage hematopoiesis.  Minimal erythroid atypia and megakaryocyte clustering.  A low-grade myelodysplastic syndrome with minimal  cytologic atypia may be considered. Cytogenetics is normal. MDS FISH panel was normal.  INTERVAL HISTORY Cindy Trujillo is a 67 y.o. female who has above history reviewed by me today presents for follow up visit for history of DVT, factor V Leiden heterozygous mutation, thrombocytopenia and history of endometrial cancer Abdomen ultrasound was ordered and was not done + recent COVID19 infection, she feels fatigue after that.  03/25/22 S/p US guided liver biopsy which showed moderately steatosis with features of steatohepatitis.  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 [Stage 3]   Review of Systems  Constitutional:  Negative for appetite change, chills, fatigue and fever.  HENT:   Negative for hearing loss and voice change.   Eyes:  Negative for eye problems.  Respiratory:  Negative for chest tightness and cough.   Cardiovascular:  Negative for chest pain.  Gastrointestinal:  Negative for abdominal distention, abdominal pain and blood in stool.  Endocrine: Negative for hot flashes.  Genitourinary:  Negative for difficulty urinating and frequency.   Musculoskeletal:  Negative for arthralgias.  Skin:  Negative for itching and rash.  Neurological:  Negative for extremity weakness.  Hematological:  Negative for adenopathy. Bruises/bleeds easily.  Psychiatric/Behavioral:  Negative for confusion.     MEDICAL HISTORY:  Past Medical History:  Diagnosis Date   Anemia    Bell's palsy    Diabetes mellitus without complication ([place])    DVT (deep venous thrombosis) ([place]) 08/2018   right leg   GERD (gastroesophageal reflux disease)    Hyperlipidemia    Post-menopausal bleeding 09/21/2019    SURGICAL HISTORY: Past Surgical History:  Procedure Laterality Date   COLONOSCOPY WITH PROPOFOL N/A 08/23/2017   Procedure: COLONOSCOPY WITH PROPOFOL;  Surgeon: Manya Silvas, Trujillo;  Location: [facility] ENDOSCOPY;  Service: Endoscopy;  Laterality: N/A;   ENDOVENOUS ABLATION SAPHENOUS VEIN W/ LASER Left 02/29/2020   endovenous laser ablation left greater saphenous vein and stab  phlebectomy 10-20 incisions left leg by Gae Gallop Trujillo    HYSTEROSCOPY WITH D & C N/A 10/31/2019   Procedure: DILATATION AND CURETTAGE /HYSTEROSCOPY WITH MYOSURE RESECTION;  Surgeon: Homero Fellers, Trujillo;  Location: [facility] ORS;  Service: Gynecology;  Laterality: N/A;   ROBOTIC ASSISTED TOTAL HYSTERECTOMY WITH BILATERAL SALPINGO OOPHERECTOMY N/A 12/06/2019   Procedure: XI ROBOTIC ASSISTED TOTAL HYSTERECTOMY WITH BILATERAL SALPINGO OOPHORECTOMY;  Surgeon: Gillis Ends, Trujillo;  Location: [facility] ORS;  Service: Gynecology;  Laterality: N/A;   SENTINEL NODE BIOPSY N/A 12/06/2019   Procedure: SENTINEL NODE BIOPSY;  Surgeon: Gillis Ends, Trujillo;  Location: [facility] ORS;  Service: Gynecology;  Laterality: N/A;   TONSILLECTOMY     TONSILLECTOMY AND ADENOIDECTOMY  1968   VEIN SURGERY  05/2019   laser    SOCIAL HISTORY: Social History   Socioeconomic History   Marital status: Married    Spouse name: Roger   Number of children: 0   Years of education: some college   Highest education level: Not on file  Occupational History   Occupation: CARE MANGEMENT    Employer: [employer]  Tobacco Use   Smoking status: Never   Smokeless tobacco: Never  Vaping Use   Vaping Use: Never used  Substance and Sexual Activity   Alcohol use: No    Alcohol/week: 0.0 standard drinks of alcohol   Drug use: No   Sexual activity: Not Currently    Birth control/protection: Surgical  Other Topics Concern   Not on file  Social History  Narrative   Lives with Francee Piccolo.  No pets.    Social Determinants of Health   Financial Resource Strain: Not on file  Food Insecurity: Not on file  Transportation Needs: Not on file  Physical Activity: Not on file  Stress: Not on file  Social Connections: Not on file  Intimate Partner Violence: Not on file    FAMILY HISTORY: Family History  Problem Relation Age of Onset   COPD Mother        heavy smoker   Pulmonary fibrosis Mother     Fibromyalgia Mother    COPD Father    Melanoma Father    COPD Sister    COPD Brother    COPD Sister    COPD Sister    Healthy Brother    Heart failure Maternal Grandmother    Heart attack Maternal Grandfather    Cancer Paternal Grandmother        unsure of type, had mets in colon   Heart attack Paternal Grandfather    Cancer Paternal Grandfather        unknown   Breast cancer Neg Hx    Cervical cancer Neg Hx     ALLERGIES:  is allergic to sulfa antibiotics.  MEDICATIONS:  Current Outpatient Medications  Medication Sig Dispense Refill   artificial tears (LACRILUBE) OINT ophthalmic ointment Place into the left eye at bedtime as needed for dry eyes. 7 g 1   aspirin EC 81 MG tablet Take 1 tablet (81 mg total) by mouth daily. Swallow whole. 90 tablet 0   blood glucose meter kit and supplies Dispense based on patient and insurance preference. Use up to four times daily as directed. (FOR ICD-10 E10.9, E11.9). 1 each 0   Blood Glucose Monitoring Suppl (FREESTYLE LITE) DEVI      COVID-19 mRNA vaccine 2023-2024 (COMIRNATY) syringe Inject into the muscle. 0.3 mL 0   dapagliflozin propanediol (FARXIGA) 10 MG TABS tablet Take 1 tablet (10 mg total) by mouth daily. 90 tablet 3   ferrous sulfate 325 (65 FE) MG tablet TAKE 1 TABLET BY MOUTH TWICE DAILY 60 tablet 2   FreeStyle Unistick II Lancets MISC Use as instructed to check blood glucose daily 100 each 5   glucose blood (FREESTYLE TEST STRIPS) test strip Use as instructed to check blood glucose daily 100 each 12   glucose monitoring kit (FREESTYLE) monitoring kit 1 each by Does not apply route as needed for other. 1 each 0   Lancets (FREESTYLE) lancets      Lancets (FREESTYLE) lancets Use as directed up to four times a day 100 each 0   lisinopril (ZESTRIL) 5 MG tablet TAKE 1 TABLET (5 MG TOTAL) BY MOUTH DAILY. 90 tablet 3   loratadine (CLARITIN) 10 MG tablet Take 1 tablet (10 mg total) by mouth daily. 90 tablet 3   Magnesium 400 MG TABS Take  1 tablet by mouth daily.     metFORMIN (GLUCOPHAGE) 1000 MG tablet TAKE 1 TABLET BY MOUTH TWICE DAILY WITH A MEAL. 180 tablet 3   Olopatadine HCl 0.2 % SOLN Apply 1 drop to eye 4 (four) times daily as needed. 2.5 mL 0   omeprazole (PRILOSEC) 20 MG capsule Take 1 capsule (20 mg total) by mouth daily. 90 capsule 3   rosuvastatin (CRESTOR) 5 MG tablet Take 1 tablet (5 mg total) by mouth daily. 90 tablet 3   vitamin B-12 (CYANOCOBALAMIN) 1000 MCG tablet Take 1 tablet (1,000 mcg total) by mouth daily. (Patient taking differently: Take  1,000 mcg by mouth every evening.) 90 tablet 1   Multiple Vitamin (MULTIVITAMIN WITH MINERALS) TABS tablet Take 1 tablet by mouth 2 (two) times a week. (Patient not taking: Reported on 04/30/2022)     No current facility-administered medications for this visit.     PHYSICAL EXAMINATION: ECOG PERFORMANCE STATUS: 0 - Asymptomatic Vitals:   04/30/22 1459  BP: 124/66  Pulse: 84  Resp: 18  Temp: (!) 97.1 F (36.2 C)   Filed Weights   04/30/22 1459  Weight: 213 lb 8 oz (96.8 kg)    Physical Exam Constitutional:      General: She is not in acute distress. HENT:     Head: Normocephalic and atraumatic.  Eyes:     General: No scleral icterus.    Pupils: Pupils are equal, round, and reactive to light.  Cardiovascular:     Rate and Rhythm: Normal rate and regular rhythm.  Pulmonary:     Effort: Pulmonary effort is normal. No respiratory distress.     Breath sounds: No wheezing.  Abdominal:     General: There is no distension.     Palpations: Abdomen is soft.  Musculoskeletal:        General: No deformity. Normal range of motion.     Cervical back: Normal range of motion and neck supple.  Skin:    General: Skin is warm and dry.  Neurological:     Mental Status: She is alert and oriented to person, place, and time. Mental status is at baseline.     Cranial Nerves: No cranial nerve deficit.     Coordination: Coordination normal.  Psychiatric:        Mood  and Affect: Mood normal.      LABORATORY DATA:  I have reviewed the data as listed    Latest Ref Rng & Units 04/28/2022    1:47 PM 03/25/2022    8:12 AM 10/07/2021    2:54 PM  CBC  WBC 4.0 - 10.5 K/uL 3.3  3.5  3.7   Hemoglobin 12.0 - 15.0 g/dL 12.2  11.8  12.9   Hematocrit 36.0 - 46.0 % 36.9  35.2  37.3   Platelets 150 - 400 K/uL 66  88  70       Latest Ref Rng & Units 04/28/2022    1:47 PM 04/27/2022   10:23 AM 10/07/2021    2:54 PM  CMP  Glucose 70 - 99 mg/dL 157  153  116   BUN 8 - 23 mg/dL '22  19  14   '$ Creatinine 0.44 - 1.00 mg/dL 1.13  1.16  1.14   Sodium 135 - 145 mmol/L 135  140  140   Potassium 3.5 - 5.1 mmol/L 4.4  4.3  4.5   Chloride 98 - 111 mmol/L 104  102  105   CO2 22 - 32 mmol/L '21  19  20   '$ Calcium 8.9 - 10.3 mg/dL 8.9  9.7  9.7   Total Protein 6.5 - 8.1 g/dL 7.9  7.3  7.0   Total Bilirubin 0.3 - 1.2 mg/dL 1.1  1.3  1.2   Alkaline Phos 38 - 126 U/L 127  157  142   AST 15 - 41 U/L 43  43  38   ALT 0 - 44 U/L '23  26  25         '$ RADIOGRAPHIC STUDIES: I have personally reviewed the radiological images as listed and agreed with the findings in the report. US BIOPSY (  LIVER)  Result Date: 03/25/2022 INDICATION: NASH EXAM: Ultrasound-guided random liver core needle biopsy MEDICATIONS: None. ANESTHESIA/SEDATION: Moderate (conscious) sedation was employed during this procedure. A total of Versed 1 mg and Fentanyl 50 mcg was administered intravenously by the radiology nurse. Total intra-service moderate Sedation Time: 10 minutes. The patient's level of consciousness and vital signs were monitored continuously by radiology nursing throughout the procedure under my direct supervision. COMPLICATIONS: None immediate. PROCEDURE: Informed written consent was obtained from the patient after a thorough discussion of the procedural risks, benefits and alternatives. All questions were addressed. Maximal Sterile Barrier Technique was utilized including caps, mask, sterile gowns,  sterile gloves, sterile drape, hand hygiene and skin antiseptic. A timeout was performed prior to the initiation of the procedure. The patient was placed supine on the exam table. Ultrasound of the liver demonstrated appropriate biopsy site in the right liver lobe. Skin entry site was marked, and the overlying skin was prepped draped in the standard sterile fashion. Local analgesia was obtained with 1% lidocaine. Using ultrasound guidance, a 17 gauge introducer needle was introduced into the right hepatic lobe. Subsequently, core needle biopsy was performed of the right hepatic lobe a random fashion using an 18 gauge core biopsy device x3 total passes. Specimens were submitted in formalin to pathology for further handling. Limited postprocedure imaging demonstrated no hematoma. A clean dressing was placed after manual hemostasis. The patient tolerated the procedure well without immediate complication. IMPRESSION: Successful ultrasound-guided random liver core needle biopsy of the right hepatic lobe. Electronically Signed   By: Albin Felling M.D.   On: 03/25/2022 11:21

## 2022-05-06 NOTE — Progress Notes (Signed)
Pt informed and verbalized understanding. Pt wants to know about a copay card to help with cost. Told pt we may have to do a PA for insurance to cover. Pt does not want to add 2 more medications but she is willing to add one more.  Pt wants medication sent to [facility].

## 2022-05-08 ENCOUNTER — Other Ambulatory Visit: Payer: Self-pay | Admitting: Family Medicine

## 2022-05-08 ENCOUNTER — Other Ambulatory Visit: Payer: Self-pay

## 2022-05-08 MED ORDER — VITAMIN D (ERGOCALCIFEROL) 1.25 MG (50000 UNIT) PO CAPS
50000.0000 [IU] | ORAL_CAPSULE | ORAL | 0 refills | Status: DC
  Filled 2022-05-08: qty 12, 84d supply, fill #0

## 2022-05-08 MED ORDER — OZEMPIC (0.25 OR 0.5 MG/DOSE) 2 MG/3ML ~~LOC~~ SOPN
PEN_INJECTOR | SUBCUTANEOUS | 0 refills | Status: AC
  Filled 2022-05-08 – 2022-08-17 (×3): qty 3, 28d supply, fill #0

## 2022-05-08 MED ORDER — OZEMPIC (0.25 OR 0.5 MG/DOSE) 2 MG/3ML ~~LOC~~ SOPN
0.5000 mg | PEN_INJECTOR | SUBCUTANEOUS | 5 refills | Status: DC
  Filled 2022-05-08: qty 3, fill #0
  Filled 2022-09-21: qty 3, 28d supply, fill #0

## 2022-05-24 ENCOUNTER — Other Ambulatory Visit: Payer: Self-pay

## 2022-06-02 ENCOUNTER — Other Ambulatory Visit: Payer: Self-pay

## 2022-06-03 ENCOUNTER — Ambulatory Visit: Payer: Self-pay

## 2022-06-12 ENCOUNTER — Other Ambulatory Visit: Payer: Self-pay

## 2022-06-12 ENCOUNTER — Encounter: Payer: Self-pay | Admitting: Oncology

## 2022-06-15 ENCOUNTER — Other Ambulatory Visit: Payer: Self-pay

## 2022-06-17 ENCOUNTER — Ambulatory Visit: Payer: Self-pay

## 2022-08-17 ENCOUNTER — Ambulatory Visit (INDEPENDENT_AMBULATORY_CARE_PROVIDER_SITE_OTHER): Payer: Commercial Managed Care - PPO | Admitting: Family Medicine

## 2022-08-17 ENCOUNTER — Other Ambulatory Visit: Payer: Self-pay

## 2022-08-17 ENCOUNTER — Encounter: Payer: Self-pay | Admitting: Oncology

## 2022-08-17 VITALS — BP 129/61 | HR 88 | Temp 99.3°F | Ht 64.0 in | Wt 208.2 lb

## 2022-08-17 DIAGNOSIS — R052 Subacute cough: Secondary | ICD-10-CM | POA: Diagnosis not present

## 2022-08-17 DIAGNOSIS — J0110 Acute frontal sinusitis, unspecified: Secondary | ICD-10-CM

## 2022-08-17 DIAGNOSIS — D696 Thrombocytopenia, unspecified: Secondary | ICD-10-CM

## 2022-08-17 DIAGNOSIS — R040 Epistaxis: Secondary | ICD-10-CM

## 2022-08-17 MED ORDER — BENZONATATE 200 MG PO CAPS
200.0000 mg | ORAL_CAPSULE | Freq: Two times a day (BID) | ORAL | 0 refills | Status: DC | PRN
  Filled 2022-08-17: qty 20, 10d supply, fill #0

## 2022-08-17 MED ORDER — AMOXICILLIN 500 MG PO CAPS
1000.0000 mg | ORAL_CAPSULE | Freq: Two times a day (BID) | ORAL | 0 refills | Status: AC
  Filled 2022-08-17: qty 40, 10d supply, fill #0

## 2022-08-17 NOTE — Progress Notes (Signed)
I,Sha'taria Tyson,acting as a Neurosurgeon for Mila Merry, MD.,have documented all relevant documentation on the behalf of Mila Merry, MD,as directed by  Mila Merry, MD while in the presence of Mila Merry, MD.   Established patient visit   Patient: Cindy Trujillo   DOB: 12/02/1955   67 y.o. Female  MRN: [id number] Visit Date: 08/17/2022  Today's healthcare provider: Mila Merry, MD   No chief complaint on file.  Subjective    Cough This is a new problem. Episode onset: April 26. The cough is Productive of sputum (yellowish-green). Associated symptoms include chills, a fever, postnasal drip and a sore throat. Associated symptoms comments: Bloody nose (left nostril). The symptoms are aggravated by pollens. Risk factors for lung disease include travel. Treatments tried: benadryl, delsym dm and tylenol. The treatment provided no relief. Her past medical history is significant for environmental allergies.  100.9 oral yesterday around 5:00 pm. Patient reports taking tylenol around 9:00 am today and using lozenges Patient reports she has not taken any covid test She states she has been having nose bleeds most days for the last couple of months. 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  Lab Results  Component Value Date   PLT 66 (L) 04/28/2022     Medications: Outpatient Medications Prior to Visit  Medication Sig   artificial tears (LACRILUBE) OINT ophthalmic ointment Place into the left eye at bedtime as needed for dry eyes.   aspirin EC 81 MG tablet Take 1 tablet (81 mg total) by mouth daily. Swallow whole.   blood glucose meter kit and supplies Dispense based on patient and insurance preference. Use up to four times daily as directed. (FOR ICD-10 E10.9, E11.9).   Blood Glucose Monitoring Suppl (FREESTYLE LITE) DEVI    COVID-19 mRNA vaccine 2023-2024 (COMIRNATY) syringe Inject into the muscle.   dapagliflozin propanediol (FARXIGA) 10 MG TABS tablet Take 1 tablet  (10 mg total) by mouth daily.   ferrous sulfate 325 (65 FE) MG tablet TAKE 1 TABLET BY MOUTH TWICE DAILY   FreeStyle Unistick II Lancets MISC Use as instructed to check blood glucose daily   glucose blood (FREESTYLE TEST STRIPS) test strip Use as instructed to check blood glucose daily   glucose monitoring kit (FREESTYLE) monitoring kit 1 each by Does not apply route as needed for other.   Lancets (FREESTYLE) lancets    Lancets (FREESTYLE) lancets Use as directed up to four times a day   lisinopril (ZESTRIL) 5 MG tablet TAKE 1 TABLET (5 MG TOTAL) BY MOUTH DAILY.   loratadine (CLARITIN) 10 MG tablet Take 1 tablet (10 mg total) by mouth daily.   Magnesium 400 MG TABS Take 1 tablet by mouth daily.   metFORMIN (GLUCOPHAGE) 1000 MG tablet TAKE 1 TABLET BY MOUTH TWICE DAILY WITH A MEAL.   Multiple Vitamin (MULTIVITAMIN WITH MINERALS) TABS tablet Take 1 tablet by mouth 2 (two) times a week. (Patient not taking: Reported on 04/30/2022)   Olopatadine HCl 0.2 % SOLN Apply 1 drop to eye 4 (four) times daily as needed.   omeprazole (PRILOSEC) 20 MG capsule Take 1 capsule (20 mg total) by mouth daily.   rosuvastatin (CRESTOR) 5 MG tablet Take 1 tablet (5 mg total) by mouth daily.   Semaglutide,0.25 or 0.5MG /DOS, (OZEMPIC, 0.25 OR 0.5 MG/DOSE,) 2 MG/3ML SOPN Inject 0.5 mg into the skin once a week.   vitamin B-12 (CYANOCOBALAMIN) 1000 MCG tablet Take 1 tablet (1,000 mcg total) by mouth daily. (Patient taking differently: Take  1,000 mcg by mouth every evening.)   Vitamin D, Ergocalciferol, (DRISDOL) 1.25 MG (50000 UNIT) CAPS capsule Take 1 capsule (50,000 Units total) by mouth every 7 (seven) days.   No facility-administered medications prior to visit.    Review of Systems  Constitutional:  Positive for chills and fever.  HENT:  Positive for postnasal drip and sore throat.   Respiratory:  Positive for cough.   Allergic/Immunologic: Positive for environmental allergies.       Objective    BP 129/61  (BP Location: Right Arm, Patient Position: Sitting, Cuff Size: Normal)   Pulse 88   Temp 99.3 F (37.4 C) (Oral)   Ht 5\' 4"  (1.626 m)   Wt 208 lb 3.2 oz (94.4 kg)   SpO2 100%   BMI 35.74 kg/m    Physical Exam   General Appearance:    Mildly obese female, alert, cooperative, in no acute distress  HENT:   bilateral TM normal without fluid or infection, neck without nodes, frontal sinus tender, and nasal mucosa pale and congested  Eyes:    PERRL, conjunctiva/corneas clear, EOM's intact       Lungs:     Clear to auscultation bilaterally, respirations unlabored  Heart:    Normal heart rate. Normal rhythm. No murmurs, rubs, or gallops.    Neurologic:   Awake, alert, oriented x 3. No apparent focal neurological           defect.         Assessment & Plan     1. Thrombocytopenia (HCC)  - CBC  2. Epistaxis  - CBC - INR/PT  3. Acute non-recurrent frontal sinusitis  - amoxicillin (AMOXIL) 500 MG capsule; Take 2 capsules (1,000 mg total) by mouth 2 (two) times daily for 10 days.  Dispense: 40 capsule; Refill: 0  4. Subacute cough  - benzonatate (TESSALON) 200 MG capsule; Take 1 capsule (200 mg total) by mouth 2 (two) times daily as needed for cough.  Dispense: 20 capsule; Refill: 0         Mila Merry, MD  [facility] [phone] (phone) [phone] (fax)  [facility]

## 2022-08-18 ENCOUNTER — Other Ambulatory Visit: Payer: Self-pay | Admitting: Family Medicine

## 2022-08-18 DIAGNOSIS — R040 Epistaxis: Secondary | ICD-10-CM

## 2022-08-18 LAB — CBC
Hematocrit: 29.8 % — ABNORMAL LOW (ref 34.0–46.6)
Hemoglobin: 10.2 g/dL — ABNORMAL LOW (ref 11.1–15.9)
MCH: 29.2 pg (ref 26.6–33.0)
MCHC: 34.2 g/dL (ref 31.5–35.7)
MCV: 85 fL (ref 79–97)
Platelets: 80 10*3/uL — CL (ref 150–450)
RBC: 3.49 x10E6/uL — ABNORMAL LOW (ref 3.77–5.28)
RDW: 13.3 % (ref 11.7–15.4)
WBC: 3.5 10*3/uL (ref 3.4–10.8)

## 2022-08-18 LAB — PROTIME-INR
INR: 1 (ref 0.9–1.2)
Prothrombin Time: 10.9 s (ref 9.1–12.0)

## 2022-09-01 ENCOUNTER — Other Ambulatory Visit: Payer: Self-pay

## 2022-09-04 ENCOUNTER — Other Ambulatory Visit: Payer: Self-pay

## 2022-09-21 ENCOUNTER — Other Ambulatory Visit: Payer: Self-pay

## 2022-09-22 ENCOUNTER — Other Ambulatory Visit: Payer: Self-pay

## 2022-09-22 ENCOUNTER — Other Ambulatory Visit: Payer: Self-pay | Admitting: Family Medicine

## 2022-09-22 MED ORDER — OZEMPIC (0.25 OR 0.5 MG/DOSE) 2 MG/3ML ~~LOC~~ SOPN
0.5000 mg | PEN_INJECTOR | SUBCUTANEOUS | 0 refills | Status: DC
  Filled 2022-09-22 (×2): qty 3, 28d supply, fill #0

## 2022-09-23 ENCOUNTER — Other Ambulatory Visit: Payer: Self-pay

## 2022-10-09 ENCOUNTER — Encounter: Payer: Self-pay | Admitting: Family Medicine

## 2022-10-09 ENCOUNTER — Ambulatory Visit (INDEPENDENT_AMBULATORY_CARE_PROVIDER_SITE_OTHER): Payer: Commercial Managed Care - PPO | Admitting: Family Medicine

## 2022-10-09 ENCOUNTER — Other Ambulatory Visit: Payer: Self-pay

## 2022-10-09 VITALS — BP 107/69 | HR 82 | Temp 97.8°F | Resp 12 | Ht 64.0 in | Wt 208.0 lb

## 2022-10-09 DIAGNOSIS — D696 Thrombocytopenia, unspecified: Secondary | ICD-10-CM

## 2022-10-09 DIAGNOSIS — R7989 Other specified abnormal findings of blood chemistry: Secondary | ICD-10-CM

## 2022-10-09 DIAGNOSIS — E785 Hyperlipidemia, unspecified: Secondary | ICD-10-CM

## 2022-10-09 DIAGNOSIS — E559 Vitamin D deficiency, unspecified: Secondary | ICD-10-CM

## 2022-10-09 DIAGNOSIS — Z0000 Encounter for general adult medical examination without abnormal findings: Secondary | ICD-10-CM

## 2022-10-09 DIAGNOSIS — E1129 Type 2 diabetes mellitus with other diabetic kidney complication: Secondary | ICD-10-CM | POA: Diagnosis not present

## 2022-10-09 DIAGNOSIS — E1169 Type 2 diabetes mellitus with other specified complication: Secondary | ICD-10-CM | POA: Diagnosis not present

## 2022-10-09 DIAGNOSIS — R809 Proteinuria, unspecified: Secondary | ICD-10-CM

## 2022-10-09 MED ORDER — METFORMIN HCL 1000 MG PO TABS
1000.0000 mg | ORAL_TABLET | Freq: Two times a day (BID) | ORAL | 3 refills | Status: DC
  Filled 2022-10-09: qty 180, fill #0
  Filled 2022-11-26: qty 180, 90d supply, fill #0
  Filled 2023-02-22: qty 180, 90d supply, fill #1

## 2022-10-09 MED ORDER — ROSUVASTATIN CALCIUM 5 MG PO TABS
5.0000 mg | ORAL_TABLET | Freq: Every day | ORAL | 3 refills | Status: DC
  Filled 2022-10-09: qty 90, 90d supply, fill #0
  Filled 2023-02-22: qty 90, 90d supply, fill #1

## 2022-10-09 MED ORDER — LISINOPRIL 5 MG PO TABS
5.0000 mg | ORAL_TABLET | Freq: Every day | ORAL | 3 refills | Status: DC
Start: 2022-10-09 — End: 2023-04-08
  Filled 2022-10-09 – 2022-11-26 (×2): qty 90, 90d supply, fill #0
  Filled 2023-02-22: qty 90, 90d supply, fill #1

## 2022-10-09 MED ORDER — OZEMPIC (0.25 OR 0.5 MG/DOSE) 2 MG/3ML ~~LOC~~ SOPN
0.5000 mg | PEN_INJECTOR | SUBCUTANEOUS | 1 refills | Status: DC
  Filled 2022-10-09: qty 9, 84d supply, fill #0

## 2022-10-09 MED ORDER — DAPAGLIFLOZIN PROPANEDIOL 10 MG PO TABS
10.0000 mg | ORAL_TABLET | Freq: Every day | ORAL | 3 refills | Status: DC
  Filled 2022-10-09 – 2022-11-26 (×2): qty 90, 90d supply, fill #0
  Filled 2023-02-22: qty 90, 90d supply, fill #1

## 2022-10-09 NOTE — Assessment & Plan Note (Signed)
Elevated last A1c (8.8) despite Ozempic 0.5mg  and Metformin 1000mg  BID. Patient reports decreased appetite, possible GI upset, and no significant weight loss. Discussed potential increase in Ozempic dose to 1mg  for better glycemic control and weight loss. -Check A1c, kidney and liver function, and vitamin D levels today. -Consider increasing Ozempic to 1mg  pending A1c results. -Continue Farxiga 10mg  daily. -Plan for 7-month follow-up, extend to 6 months if A1c is satisfactory.

## 2022-10-09 NOTE — Progress Notes (Signed)
I,Sulibeya S Dimas,acting as a Neurosurgeon for Shirlee Latch, MD.,have documented all relevant documentation on the behalf of Shirlee Latch, MD,as directed by  Shirlee Latch, MD while in the presence of Shirlee Latch, MD.    Complete physical exam   Patient: Cindy Trujillo   DOB: 06/07/1955   67 y.o. Female  MRN: [id number] Visit Date: 10/09/2022  Today's healthcare provider: Shirlee Latch, MD   Chief Complaint  Patient presents with   Annual Exam   Subjective    Kaliannah Calva Poppell is a 67 y.o. female who presents today for a complete physical exam.  She reports consuming a general diet. The patient does not participate in regular exercise at present. She generally feels well. She reports sleeping well. She does not have additional problems to discuss today.  HPI   Discussed the use of AI scribe software for clinical note transcription with the patient, who gave verbal consent to proceed.  History of Present Illness   The patient, with a history of diabetes, presents for a routine physical examination. She reports a decreased appetite, which she believes may be a side effect of their Ozempic medication. The patient has been monitoring their blood sugar levels but has not noticed a significant improvement since starting Ozempic. She is currently on their second injection pen of Ozempic, having started with a 0.25 dosage and then moved to 0.5. The patient also takes metformin and Farxiga for their diabetes.  The patient has had a high-stress year due to family issues, including the illness and subsequent passing of their father. This stress, coupled with frequent travel, may have impacted their diabetes management. The patient delayed starting their Ozempic medication until after they returned home due to concerns about potential stomach upset.  The patient has concerns about the cost of their medications and the lack of reimbursement from their insurance. She is considering  increasing their Ozempic dosage to 1mg , depending on the results of their A1c test.        Past Medical History:  Diagnosis Date   Anemia    Bell's palsy    Diabetes mellitus without complication (HCC)    DVT (deep venous thrombosis) (HCC) 08/2018   right leg   GERD (gastroesophageal reflux disease)    Hyperlipidemia    Post-menopausal bleeding 09/21/2019   Past Surgical History:  Procedure Laterality Date   COLONOSCOPY WITH PROPOFOL N/A 08/23/2017   Procedure: COLONOSCOPY WITH PROPOFOL;  Surgeon: Scot Jun, MD;  Location: [facility];  Service: Endoscopy;  Laterality: N/A;   ENDOVENOUS ABLATION SAPHENOUS VEIN W/ LASER Left 02/29/2020   endovenous laser ablation left greater saphenous vein and stab phlebectomy 10-20 incisions left leg by Cari Caraway MD    HYSTEROSCOPY WITH D & C N/A 10/31/2019   Procedure: DILATATION AND CURETTAGE /HYSTEROSCOPY WITH MYOSURE RESECTION;  Surgeon: Natale Milch, MD;  Location: [facility] ORS;  Service: Gynecology;  Laterality: N/A;   ROBOTIC ASSISTED TOTAL HYSTERECTOMY WITH BILATERAL SALPINGO OOPHERECTOMY N/A 12/06/2019   Procedure: XI ROBOTIC ASSISTED TOTAL HYSTERECTOMY WITH BILATERAL SALPINGO OOPHORECTOMY;  Surgeon: Artelia Laroche, MD;  Location: [facility] OR[facility];  Service: Gynecology;  Laterality: N/A;   SENTINEL NODE BIOPSY N/A 12/06/2019   Procedure: SENTINEL NODE BIOPSY;  Surgeon: Artelia Laroche, MD;  Location: [facility] OR[facility];  Service: Gynecology;  Laterality: N/A;   TONSILLECTOMY     TONSILLECTOMY AND ADENOIDECTOMY  1968   VEIN SURGERY  05/2019   laser   Social History   Socioeconomic History  Marital status: Married    Spouse name: Fredrik Cove   Number of children: 0   Years of education: some college   Highest education level: 12th grade  Occupational History   Occupation: CARE MANGEMENT    Employer: [employer]  Tobacco Use   Smoking status: Never   Smokeless tobacco: Never  Vaping Use   Vaping Use:  Never used  Substance and Sexual Activity   Alcohol use: No    Alcohol/week: 0.0 standard drinks of alcohol   Drug use: No   Sexual activity: Not Currently    Birth control/protection: Surgical  Other Topics Concern   Not on file  Social History Narrative   Lives with Geographical information systems officer.  No pets.    Social Determinants of Health   Financial Resource Strain: Low Risk  (08/17/2022)   Overall Financial Resource Strain (CARDIA)    Difficulty of Paying Living Expenses: Not hard at all  Food Insecurity: No Food Insecurity (08/17/2022)   Hunger Vital Sign    Worried About Running Out of Food in the Last Year: Never true    Ran Out of Food in the Last Year: Never true  Transportation Needs: No Transportation Needs (08/17/2022)   PRAPARE - Administrator, Civil Service (Medical): No    Lack of Transportation (Non-Medical): No  Physical Activity: Insufficiently Active (08/17/2022)   Exercise Vital Sign    Days of Exercise per Week: 3 days    Minutes of Exercise per Session: 30 min  Stress: No Stress Concern Present (08/17/2022)   [employer] of Occupational Health - Occupational Stress Questionnaire    Feeling of Stress : Only a little  Social Connections: Unknown (08/17/2022)   Social Connection and Isolation Panel [NHANES]    Frequency of Communication with Friends and Family: More than three times a week    Frequency of Social Gatherings with Friends and Family: Once a week    Attends Religious Services: More than 4 times per year    Active Member of [employer] or Organizations: Patient declined    Attends Engineer, structural: Not on file    Marital Status: Married  Catering manager Violence: Not on file   Family Status  Relation Name Status   Mother  Deceased       complications from lung disease   Father  Deceased   Sister  Alive   Sister  Alive   Sister  Alive   Brother  Alive   Brother  Alive   MGM  (Not Specified)   MGF  (Not Specified)   PGM  (Not Specified)   PGF   (Not Specified)   Neg Hx  (Not Specified)   Family History  Problem Relation Age of Onset   COPD Mother        heavy smoker   Pulmonary fibrosis Mother    Fibromyalgia Mother    Cancer Father    COPD Father    Melanoma Father    COPD Sister    Pulmonary fibrosis Sister    Pulmonary fibrosis Sister    COPD Sister    Pulmonary fibrosis Sister    COPD Sister    COPD Brother    Healthy Brother    Heart failure Maternal Grandmother    Heart attack Maternal Grandfather    Cancer Paternal Grandmother        unsure of type, had mets in colon   Heart attack Paternal Grandfather    Cancer Paternal Grandfather  unknown   Breast cancer Neg Hx    Cervical cancer Neg Hx    Allergies  Allergen Reactions   Sulfa Antibiotics Hives    Patient Care Team: Erasmo Downer, MD as PCP - General (Family Medicine) Benita Gutter, RN as Oncology Nurse Navigator [place], [place], MD as Referring Physician (Obstetrics) Rickard Patience, MD as Consulting Physician (Oncology) Leida Lauth, MD as Referring Physician (Obstetrics)   Medications: Outpatient Medications Prior to Visit  Medication Sig   artificial tears (LACRILUBE) OINT ophthalmic ointment Place into the left eye at bedtime as needed for dry eyes.   aspirin EC 81 MG tablet Take 1 tablet (81 mg total) by mouth daily. Swallow whole.   blood glucose meter kit and supplies Dispense based on patient and insurance preference. Use up to four times daily as directed. (FOR ICD-10 E10.9, E11.9).   Blood Glucose Monitoring Suppl (FREESTYLE LITE) DEVI    FreeStyle Unistick II Lancets MISC Use as instructed to check blood glucose daily   glucose blood (FREESTYLE TEST STRIPS) test strip Use as instructed to check blood glucose daily   glucose monitoring kit (FREESTYLE) monitoring kit 1 each by Does not apply route as needed for other.   Lancets (FREESTYLE) lancets    Lancets (FREESTYLE) lancets Use as directed up to four times a  day   loratadine (CLARITIN) 10 MG tablet Take 1 tablet (10 mg total) by mouth daily.   Magnesium 400 MG TABS Take 1 tablet by mouth daily.   Multiple Vitamin (MULTIVITAMIN WITH MINERALS) TABS tablet Take 1 tablet by mouth 2 (two) times a week.   Olopatadine HCl 0.2 % SOLN Apply 1 drop to eye 4 (four) times daily as needed.   omeprazole (PRILOSEC) 20 MG capsule Take 1 capsule (20 mg total) by mouth daily.   vitamin B-12 (CYANOCOBALAMIN) 1000 MCG tablet Take 1 tablet (1,000 mcg total) by mouth daily. (Patient taking differently: Take 1,000 mcg by mouth every evening.)   Vitamin D, Ergocalciferol, (DRISDOL) 1.25 MG (50000 UNIT) CAPS capsule Take 1 capsule (50,000 Units total) by mouth every 7 (seven) days.   [DISCONTINUED] COVID-19 mRNA vaccine 2023-2024 (COMIRNATY) syringe Inject into the muscle.   [DISCONTINUED] dapagliflozin propanediol (FARXIGA) 10 MG TABS tablet Take 1 tablet (10 mg total) by mouth daily.   [DISCONTINUED] lisinopril (ZESTRIL) 5 MG tablet Take 1 tablet (5 mg total) by mouth daily.   [DISCONTINUED] metFORMIN (GLUCOPHAGE) 1000 MG tablet TAKE 1 TABLET BY MOUTH TWICE DAILY WITH A MEAL.   [DISCONTINUED] rosuvastatin (CRESTOR) 5 MG tablet Take 1 tablet (5 mg total) by mouth daily.   [DISCONTINUED] Semaglutide,0.25 or 0.5MG /DOS, (OZEMPIC, 0.25 OR 0.5 MG/DOSE,) 2 MG/3ML SOPN Inject 0.5 mg into the skin once a week.   ferrous sulfate 325 (65 FE) MG tablet TAKE 1 TABLET BY MOUTH TWICE DAILY   No facility-administered medications prior to visit.    Review of Systems  Constitutional:  Positive for appetite change.  All other systems reviewed and are negative.     Objective    BP 107/69 (BP Location: Left Arm, Patient Position: Sitting, Cuff Size: Large)   Pulse 82   Temp 97.8 F (36.6 C)   Resp 12   Ht 5\' 4"  (1.626 m)   Wt 208 lb (94.3 kg)   SpO2 99%   BMI 35.70 kg/m  BP Readings from Last 3 Encounters:  10/09/22 107/69  08/17/22 129/61  04/30/22 124/66   Wt Readings  from Last 3 Encounters:  10/09/22 208  lb (94.3 kg)  08/17/22 208 lb 3.2 oz (94.4 kg)  04/30/22 213 lb 8 oz (96.8 kg)     Physical Exam Vitals reviewed.  Constitutional:      General: She is not in acute distress.    Appearance: Normal appearance. She is well-developed. She is not diaphoretic.  HENT:     Head: Normocephalic and atraumatic.     Right Ear: Tympanic membrane, ear canal and external ear normal.     Left Ear: Tympanic membrane, ear canal and external ear normal.     Nose: Nose normal.     Mouth/Throat:     Mouth: Mucous membranes are moist.     Pharynx: Oropharynx is clear. No oropharyngeal exudate.  Eyes:     General: No scleral icterus.    Conjunctiva/sclera: Conjunctivae normal.     Pupils: Pupils are equal, round, and reactive to light.  Neck:     Thyroid: No thyromegaly.  Cardiovascular:     Rate and Rhythm: Normal rate and regular rhythm.     Pulses: Normal pulses.     Heart sounds: Normal heart sounds. No murmur heard. Pulmonary:     Effort: Pulmonary effort is normal. No respiratory distress.     Breath sounds: Normal breath sounds. No wheezing or rales.  Chest:     Comments: Breasts: breasts appear normal, no suspicious masses, no skin or nipple changes or axillary nodes  Abdominal:     General: There is no distension.     Palpations: Abdomen is soft.     Tenderness: There is no abdominal tenderness.  Musculoskeletal:        General: No deformity.     Cervical back: Neck supple.     Right lower leg: No edema.     Left lower leg: No edema.  Lymphadenopathy:     Cervical: No cervical adenopathy.  Skin:    General: Skin is warm and dry.     Findings: No rash.  Neurological:     Mental Status: She is alert and oriented to person, place, and time. Mental status is at baseline.     Gait: Gait normal.  Psychiatric:        Mood and Affect: Mood normal.        Behavior: Behavior normal.        Thought Content: Thought content normal.       Last  depression screening scores    10/09/2022    8:43 AM 08/17/2022    2:56 PM 04/16/2022    4:24 PM  PHQ 2/9 Scores  PHQ - 2 Score 0 0 0  PHQ- 9 Score 2 2 2    Last fall risk screening    10/09/2022    8:43 AM  Fall Risk   Falls in the past year? 0  Number falls in past yr: 0  Injury with Fall? 0  Risk for fall due to : No Fall Risks  Follow up Falls evaluation completed   Last Audit-C alcohol use screening    08/17/2022    2:55 PM  Alcohol Use Disorder Test (AUDIT)  1. How often do you have a drink containing alcohol? 0  2. How many drinks containing alcohol do you have on a typical day when you are drinking? 0  3. How often do you have six or more drinks on one occasion? 0  AUDIT-C Score 0   A score of 3 or more in women, and 4 or more in men indicates increased risk  for alcohol abuse, EXCEPT if all of the points are from question 1   No results found for any visits on 10/09/22.  Assessment & Plan    Routine Health Maintenance and Physical Exam  Exercise Activities and Dietary recommendations  Goals   None     Immunization History  Administered Date(s) Administered   COVID-19, mRNA, vaccine(Comirnaty)12 years and older 04/27/2022   Influenza,inj,Quad PF,6+ Mos 01/05/2017, 12/29/2021   Influenza,inj,Quad PF,6-35 Mos 01/08/2021   Influenza-Unspecified 02/08/2020   PFIZER(Purple Top)SARS-COV-2 Vaccination 06/09/2019, 06/30/2019, 02/23/2020, 08/09/2020   PNEUMOCOCCAL CONJUGATE-20 10/04/2020   Pneumococcal Polysaccharide-23 03/08/2017   Tdap 03/08/2017   Zoster Recombinat (Shingrix) 03/20/2019, 05/22/2019    Health Maintenance  Topic Date Due   MAMMOGRAM  Never done   COVID-19 Vaccine (6 - 2023-24 season) 06/22/2022   HEMOGLOBIN A1C  10/26/2022   INFLUENZA VACCINE  11/12/2022   OPHTHALMOLOGY EXAM  04/09/2023   Diabetic kidney evaluation - Urine ACR  04/28/2023   Diabetic kidney evaluation - eGFR measurement  04/29/2023   FOOT EXAM  10/09/2023   DTaP/Tdap/Td (2 - Td  or Tdap) 03/09/2027   Colonoscopy  08/24/2027   Hepatitis C Screening  Completed   HIV Screening  Completed   Zoster Vaccines- Shingrix  Completed   HPV VACCINES  Aged Out    Discussed health benefits of physical activity, and encouraged her to engage in regular exercise appropriate for her age and condition.  Problem List Items Addressed This Visit       Endocrine   T2DM (type 2 diabetes mellitus) (HCC)    Elevated last A1c (8.8) despite Ozempic 0.5mg  and Metformin 1000mg  BID. Patient reports decreased appetite, possible GI upset, and no significant weight loss. Discussed potential increase in Ozempic dose to 1mg  for better glycemic control and weight loss. -Check A1c, kidney and liver function, and vitamin D levels today. -Consider increasing Ozempic to 1mg  pending A1c results. -Continue Farxiga 10mg  daily. -Plan for 65-month follow-up, extend to 6 months if A1c is satisfactory.      Relevant Medications   Semaglutide,0.25 or 0.5MG /DOS, (OZEMPIC, 0.25 OR 0.5 MG/DOSE,) 2 MG/3ML SOPN   dapagliflozin propanediol (FARXIGA) 10 MG TABS tablet   lisinopril (ZESTRIL) 5 MG tablet   metFORMIN (GLUCOPHAGE) 1000 MG tablet   rosuvastatin (CRESTOR) 5 MG tablet   Other Relevant Orders   Hemoglobin A1c   Hyperlipidemia associated with type 2 diabetes mellitus (HCC)    LDL at 64, well-controlled on Crestor. -Continue Crestor.      Relevant Medications   Semaglutide,0.25 or 0.5MG /DOS, (OZEMPIC, 0.25 OR 0.5 MG/DOSE,) 2 MG/3ML SOPN   dapagliflozin propanediol (FARXIGA) 10 MG TABS tablet   lisinopril (ZESTRIL) 5 MG tablet   metFORMIN (GLUCOPHAGE) 1000 MG tablet   rosuvastatin (CRESTOR) 5 MG tablet   Other Relevant Orders   Comprehensive metabolic panel   Microalbuminuria due to type 2 diabetes mellitus (HCC)    On Lisinopril 5mg  daily for kidney protection. -Continue Lisinopril 5mg  daily.      Relevant Medications   Semaglutide,0.25 or 0.5MG /DOS, (OZEMPIC, 0.25 OR 0.5 MG/DOSE,) 2 MG/3ML  SOPN   dapagliflozin propanediol (FARXIGA) 10 MG TABS tablet   lisinopril (ZESTRIL) 5 MG tablet   metFORMIN (GLUCOPHAGE) 1000 MG tablet   rosuvastatin (CRESTOR) 5 MG tablet     Hematopoietic and Hemostatic   Thrombocytopenia (HCC) (Chronic)   Relevant Orders   CBC w/Diff/Platelet     Other   Morbid obesity (HCC)   Relevant Medications   Semaglutide,0.25 or 0.5MG /DOS, (OZEMPIC, 0.25  OR 0.5 MG/DOSE,) 2 MG/3ML SOPN   dapagliflozin propanediol (FARXIGA) 10 MG TABS tablet   metFORMIN (GLUCOPHAGE) 1000 MG tablet   Elevated LFTs   Relevant Orders   Comprehensive metabolic panel   Avitaminosis D   Relevant Orders   VITAMIN D 25 Hydroxy (Vit-D Deficiency, Fractures)   Other Visit Diagnoses     Encounter for annual physical exam    -  Primary   Relevant Orders   VITAMIN D 25 Hydroxy (Vit-D Deficiency, Fractures)   Hemoglobin A1c   CBC w/Diff/Platelet   Comprehensive metabolic panel          General Health Maintenance: -Plan for pneumonia vaccine next year. -Declined mammogram. -Completed shingles vaccination. -Plan for flu shot in the fall. -Perform foot exam today, no sores or issues noted. -Consider shoe adjustment for possible foot discomfort.        Return in about 3 months (around 01/09/2023) for chronic disease f/u.     I, Shirlee Latch, MD, have reviewed all documentation for this visit. The documentation on 10/09/22 for the exam, diagnosis, procedures, and orders are all accurate and complete.   Bhargav Barbaro, Marzella Schlein, MD, MPH [facility]

## 2022-10-09 NOTE — Assessment & Plan Note (Signed)
On Lisinopril 5mg  daily for kidney protection. -Continue Lisinopril 5mg  daily.

## 2022-10-09 NOTE — Assessment & Plan Note (Signed)
LDL at 64, well-controlled on Crestor. -Continue Crestor.

## 2022-10-10 LAB — COMPREHENSIVE METABOLIC PANEL
ALT: 24 IU/L (ref 0–32)
Albumin: 4 g/dL (ref 3.9–4.9)
BUN/Creatinine Ratio: 19 (ref 12–28)
Bilirubin Total: 0.8 mg/dL (ref 0.0–1.2)
CO2: 20 mmol/L (ref 20–29)
Sodium: 138 mmol/L (ref 134–144)

## 2022-10-12 LAB — CBC WITH DIFFERENTIAL/PLATELET
Basophils Absolute: 0 10*3/uL (ref 0.0–0.2)
Basos: 0 %
EOS (ABSOLUTE): 0.1 10*3/uL (ref 0.0–0.4)
Eos: 2 %
Hematocrit: 33.5 % — ABNORMAL LOW (ref 34.0–46.6)
Hemoglobin: 11 g/dL — ABNORMAL LOW (ref 11.1–15.9)
Immature Grans (Abs): 0 10*3/uL (ref 0.0–0.1)
Immature Granulocytes: 0 %
Lymphocytes Absolute: 0.8 10*3/uL (ref 0.7–3.1)
Lymphs: 27 %
MCH: 28 pg (ref 26.6–33.0)
MCHC: 32.8 g/dL (ref 31.5–35.7)
MCV: 85 fL (ref 79–97)
Monocytes Absolute: 0.2 10*3/uL (ref 0.1–0.9)
Monocytes: 6 %
Neutrophils Absolute: 2 10*3/uL (ref 1.4–7.0)
Neutrophils: 65 %
Platelets: 70 10*3/uL — CL (ref 150–450)
RBC: 3.93 x10E6/uL (ref 3.77–5.28)
RDW: 13.8 % (ref 11.7–15.4)
WBC: 3.1 10*3/uL — ABNORMAL LOW (ref 3.4–10.8)

## 2022-10-12 LAB — COMPREHENSIVE METABOLIC PANEL
AST: 43 IU/L — ABNORMAL HIGH (ref 0–40)
Alkaline Phosphatase: 131 IU/L — ABNORMAL HIGH (ref 44–121)
BUN: 22 mg/dL (ref 8–27)
Calcium: 9.8 mg/dL (ref 8.7–10.3)
Chloride: 104 mmol/L (ref 96–106)
Creatinine, Ser: 1.17 mg/dL — ABNORMAL HIGH (ref 0.57–1.00)
Globulin, Total: 3.6 g/dL (ref 1.5–4.5)
Glucose: 130 mg/dL — ABNORMAL HIGH (ref 70–99)
Potassium: 4.7 mmol/L (ref 3.5–5.2)
Total Protein: 7.6 g/dL (ref 6.0–8.5)
eGFR: 52 mL/min/{1.73_m2} — ABNORMAL LOW (ref >59)

## 2022-10-12 LAB — HEMOGLOBIN A1C
Est. average glucose Bld gHb Est-mCnc: 169 mg/dL
Hgb A1c MFr Bld: 7.5 % — ABNORMAL HIGH (ref 4.8–5.6)

## 2022-10-12 LAB — VITAMIN D 25 HYDROXY (VIT D DEFICIENCY, FRACTURES): Vit D, 25-Hydroxy: 24.2 ng/mL — ABNORMAL LOW (ref 30.0–100.0)

## 2022-10-13 ENCOUNTER — Other Ambulatory Visit: Payer: Self-pay

## 2022-10-13 DIAGNOSIS — E1129 Type 2 diabetes mellitus with other diabetic kidney complication: Secondary | ICD-10-CM

## 2022-10-13 MED ORDER — SEMAGLUTIDE (1 MG/DOSE) 4 MG/3ML ~~LOC~~ SOPN
1.0000 mg | PEN_INJECTOR | SUBCUTANEOUS | 1 refills | Status: DC
Start: 2022-10-13 — End: 2022-11-26
  Filled 2022-10-13: qty 3, 28d supply, fill #0

## 2022-10-19 ENCOUNTER — Other Ambulatory Visit (HOSPITAL_COMMUNITY): Payer: Self-pay

## 2022-10-22 ENCOUNTER — Other Ambulatory Visit (HOSPITAL_COMMUNITY): Payer: Self-pay

## 2022-10-22 ENCOUNTER — Other Ambulatory Visit: Payer: Self-pay

## 2022-10-29 ENCOUNTER — Other Ambulatory Visit: Payer: Commercial Managed Care - PPO

## 2022-10-29 ENCOUNTER — Ambulatory Visit: Payer: Commercial Managed Care - PPO | Admitting: Oncology

## 2022-11-12 ENCOUNTER — Ambulatory Visit: Payer: Commercial Managed Care - PPO | Admitting: Physician Assistant

## 2022-11-13 ENCOUNTER — Encounter: Payer: Self-pay | Admitting: Physician Assistant

## 2022-11-13 ENCOUNTER — Ambulatory Visit: Payer: Commercial Managed Care - PPO | Admitting: Physician Assistant

## 2022-11-13 ENCOUNTER — Other Ambulatory Visit: Payer: Self-pay

## 2022-11-13 VITALS — BP 96/45 | HR 77 | Temp 98.8°F | Ht 64.0 in | Wt 208.3 lb

## 2022-11-13 DIAGNOSIS — R21 Rash and other nonspecific skin eruption: Secondary | ICD-10-CM | POA: Diagnosis not present

## 2022-11-13 NOTE — Patient Instructions (Signed)
Antihistamines OTC: zyrtec, allegra, Claritin Low potency steroid cream OTC Abreva, antifungal cream: clotrimazole 1%

## 2022-11-13 NOTE — Progress Notes (Signed)
Established patient visit  Patient: Cindy Trujillo   DOB: 06/24/1955   67 y.o. Female  MRN: [id number] Visit Date: 11/13/2022  Today's healthcare provider: Debera Lat, PA-C   No chief complaint on file.  Subjective    HPI  *** Discussed the use of AI scribe software for clinical note transcription with the patient, who gave verbal consent to proceed.  History of Present Illness               10/09/2022    8:43 AM 08/17/2022    2:56 PM 04/16/2022    4:24 PM  Depression screen PHQ 2/9  Decreased Interest 0 0 0  Down, Depressed, Hopeless 0 0 0  PHQ - 2 Score 0 0 0  Altered sleeping 0 1 0  Tired, decreased energy 1 1 1   Change in appetite 1 0 1  Feeling bad or failure about yourself  0 0 0  Trouble concentrating 0 0 0  Moving slowly or fidgety/restless 0 0 0  Suicidal thoughts 0 0 0  PHQ-9 Score 2 2 2   Difficult doing work/chores Not difficult at all Not difficult at all Not difficult at all       No data to display          Medications: Outpatient Medications Prior to Visit  Medication Sig   artificial tears (LACRILUBE) OINT ophthalmic ointment Place into the left eye at bedtime as needed for dry eyes.   aspirin EC 81 MG tablet Take 1 tablet (81 mg total) by mouth daily. Swallow whole.   blood glucose meter kit and supplies Dispense based on patient and insurance preference. Use up to four times daily as directed. (FOR ICD-10 E10.9, E11.9).   Blood Glucose Monitoring Suppl (FREESTYLE LITE) DEVI    dapagliflozin propanediol (FARXIGA) 10 MG TABS tablet Take 1 tablet (10 mg total) by mouth daily.   ferrous sulfate 325 (65 FE) MG tablet TAKE 1 TABLET BY MOUTH TWICE DAILY   FreeStyle Unistick II Lancets MISC Use as instructed to check blood glucose daily   glucose blood (FREESTYLE TEST STRIPS) test strip Use as instructed to check blood glucose daily   glucose monitoring kit (FREESTYLE) monitoring kit 1 each by Does not apply route as needed for other.   Lancets  (FREESTYLE) lancets    Lancets (FREESTYLE) lancets Use as directed up to four times a day   lisinopril (ZESTRIL) 5 MG tablet Take 1 tablet (5 mg total) by mouth daily.   loratadine (CLARITIN) 10 MG tablet Take 1 tablet (10 mg total) by mouth daily.   Magnesium 400 MG TABS Take 1 tablet by mouth daily.   metFORMIN (GLUCOPHAGE) 1000 MG tablet Take 1 tablet (1,000 mg total) by mouth 2 (two) times daily with a meal.   Multiple Vitamin (MULTIVITAMIN WITH MINERALS) TABS tablet Take 1 tablet by mouth 2 (two) times a week.   Olopatadine HCl 0.2 % SOLN Apply 1 drop to eye 4 (four) times daily as needed.   omeprazole (PRILOSEC) 20 MG capsule Take 1 capsule (20 mg total) by mouth daily.   rosuvastatin (CRESTOR) 5 MG tablet Take 1 tablet (5 mg total) by mouth daily.   Semaglutide, 1 MG/DOSE, 4 MG/3ML SOPN Inject 1 mg as directed once a week.   Semaglutide,0.25 or 0.5MG /DOS, (OZEMPIC, 0.25 OR 0.5 MG/DOSE,) 2 MG/3ML SOPN Inject 0.5 mg into the skin once a week.   vitamin B-12 (CYANOCOBALAMIN) 1000 MCG tablet Take 1 tablet (1,000 mcg total) by mouth daily. (  Patient taking differently: Take 1,000 mcg by mouth every evening.)   Vitamin D, Ergocalciferol, (DRISDOL) 1.25 MG (50000 UNIT) CAPS capsule Take 1 capsule (50,000 Units total) by mouth every 7 (seven) days.   No facility-administered medications prior to visit.    Review of Systems Except see HPI   {Insert previous labs (optional):23779}  {See past labs  Heme  Chem  Endocrine  Serology  Results Review (optional):1}   Objective    There were no vitals taken for this visit. {Insert last BP/Wt (optional):23777}  {See vitals history (optional):1}  Physical Exam   No results found for any visits on 11/13/22.  Assessment & Plan    *** Assessment and Plan              No follow-ups on file.      [facility]

## 2022-11-20 ENCOUNTER — Other Ambulatory Visit: Payer: Self-pay

## 2022-11-24 ENCOUNTER — Ambulatory Visit: Payer: Self-pay | Admitting: *Deleted

## 2022-11-24 DIAGNOSIS — E1129 Type 2 diabetes mellitus with other diabetic kidney complication: Secondary | ICD-10-CM

## 2022-11-24 NOTE — Telephone Encounter (Signed)
LVMTCB. CRM created. Ok for PEC to advise 

## 2022-11-24 NOTE — Telephone Encounter (Signed)
Summary: nauseous woth med   Pt called in about Ozempic, says making her nauseated, so she wants to know should she stop taking it.

## 2022-11-24 NOTE — Telephone Encounter (Signed)
Please review for Dr. B 

## 2022-11-24 NOTE — Telephone Encounter (Signed)
  Chief Complaint: Medication Symptoms: Nausea Frequency: Since higher dose of Ozempic, 1 month or longer. Pertinent Negatives: Patient denies vomiting, diarrhea Disposition: [] ED /[] Urgent Care (no appt availability in office) / [] Appointment(In office/virtual)/ []  [facility]/ [] Home Care/ [] Refused Recommended Disposition /[]  Mobile Bus/ [x]  Follow-up with PCP Additional Notes: Pt reports nausea after starting higher dose of Ozempic. States occurring more often, more intense, 5/7/days of nausea. States sometimes will resolve after an hour with Pepto Bismol. Occurs everyday. Reports some mild nausea with lower dose, infrequent. Questioning if she should continue to take med, dose due Thursday. Please advise. Reason for Disposition  [1] Caller has URGENT medicine question about med that PCP or specialist prescribed AND [2] triager unable to answer question  Answer Assessment - Initial Assessment Questions 1. NAME of MEDICINE: "What medicine(s) are you calling about?"     Ozempic 2. QUESTION: "What is your question?" (e.g., double dose of medicine, side effect)     Causing nausea 3. PRESCRIBER: "Who prescribed the medicine?" Reason: if prescribed by specialist, call should be referred to that group.     PCP  Protocols used: Medication Question Call-A-AH

## 2022-11-25 NOTE — Telephone Encounter (Signed)
Patient states she is eating very small portions- nausea is at varying times- no reasoning for it. Rash os clearing with recommended treatment. Patient is really having a hard time with her nausea- she is having to skip other medications due to symptoms- she is willing to go back to decreased dosing or change medication. Patient states it is time for refill- but doesn't want to fill if not going to continue. Can the follow up appointment be moved up- patient needs help now.

## 2022-11-26 ENCOUNTER — Other Ambulatory Visit: Payer: Self-pay

## 2022-11-26 ENCOUNTER — Telehealth: Payer: Self-pay

## 2022-11-26 ENCOUNTER — Encounter: Payer: Self-pay | Admitting: Oncology

## 2022-11-26 MED ORDER — OZEMPIC (0.25 OR 0.5 MG/DOSE) 2 MG/3ML ~~LOC~~ SOPN
0.5000 mg | PEN_INJECTOR | SUBCUTANEOUS | 2 refills | Status: DC
  Filled 2022-11-26 (×3): qty 3, 28d supply, fill #0

## 2022-11-26 MED ORDER — OZEMPIC (0.25 OR 0.5 MG/DOSE) 2 MG/3ML ~~LOC~~ SOPN
0.5000 mg | PEN_INJECTOR | SUBCUTANEOUS | 0 refills | Status: DC
  Filled 2022-11-26: qty 9, 84d supply, fill #0

## 2022-11-26 NOTE — Telephone Encounter (Signed)
Patient reports she is feeling better today. She said nausea comes and goes, can be before or after eating. Her refill is due today. She declined to schedule appt for today or tomorrow. She would like to know if she can just come of medication. Please advise.

## 2022-11-26 NOTE — Telephone Encounter (Signed)
Prescription updated.

## 2022-11-26 NOTE — Telephone Encounter (Signed)
Go back down to Ozempic 0.5mg  weekly dose to mitigate nausea from higher dose. Does not need to move up appt. Please send new Rx for 0.5mg  dose 1 month supply with 2 refills.

## 2022-11-26 NOTE — Addendum Note (Signed)
Addended by: Hyacinth Meeker on: 11/26/2022 11:19 AM   Modules accepted: Orders

## 2022-11-26 NOTE — Telephone Encounter (Signed)
Copied from CRM [id number]. Topic: General - Inquiry >> Nov 26, 2022 11:24 AM Lennox Pippins wrote: Patient called back and wanted a call back from Dr B nurse in regards to her Ozempic, just to make sure Ozempic got put in for 3 month supply so she only has to pay $24.99 one time instead of $24.99 per month.   Patients callback # [phone]

## 2023-01-04 ENCOUNTER — Ambulatory Visit: Payer: Commercial Managed Care - PPO | Admitting: Family Medicine

## 2023-01-12 ENCOUNTER — Encounter: Payer: Self-pay | Admitting: Family Medicine

## 2023-01-12 ENCOUNTER — Other Ambulatory Visit: Payer: Self-pay

## 2023-01-12 ENCOUNTER — Ambulatory Visit: Payer: Commercial Managed Care - PPO | Admitting: Family Medicine

## 2023-01-12 VITALS — Temp 101.9°F

## 2023-01-12 DIAGNOSIS — U071 COVID-19: Secondary | ICD-10-CM

## 2023-01-12 DIAGNOSIS — E785 Hyperlipidemia, unspecified: Secondary | ICD-10-CM | POA: Diagnosis not present

## 2023-01-12 DIAGNOSIS — Z7985 Long-term (current) use of injectable non-insulin antidiabetic drugs: Secondary | ICD-10-CM

## 2023-01-12 DIAGNOSIS — E1169 Type 2 diabetes mellitus with other specified complication: Secondary | ICD-10-CM

## 2023-01-12 DIAGNOSIS — E1129 Type 2 diabetes mellitus with other diabetic kidney complication: Secondary | ICD-10-CM

## 2023-01-12 LAB — POC COVID19 BINAXNOW: SARS Coronavirus 2 Ag: POSITIVE — AB

## 2023-01-12 MED ORDER — RYBELSUS 7 MG PO TABS
7.0000 mg | ORAL_TABLET | Freq: Every day | ORAL | 1 refills | Status: DC
  Filled 2023-01-12 – 2023-01-20 (×2): qty 90, 90d supply, fill #0

## 2023-01-12 MED ORDER — NIRMATRELVIR/RITONAVIR (PAXLOVID) TABLET (RENAL DOSING)
2.0000 | ORAL_TABLET | Freq: Two times a day (BID) | ORAL | 0 refills | Status: AC
Start: 2023-01-12 — End: 2023-01-17
  Filled 2023-01-12: qty 20, 5d supply, fill #0

## 2023-01-12 NOTE — Assessment & Plan Note (Signed)
On rosuvastatin. -Hold rosuvastatin for five days while on Paxlovid, then restart.

## 2023-01-12 NOTE — Assessment & Plan Note (Signed)
On Ozempic, but experiencing persistent nausea and no weight loss. Discussed the option of switching to Rybelsus. -Stop Ozempic. -Start Rybelsus 7mg  after completion of Paxlovid course.

## 2023-01-12 NOTE — Progress Notes (Signed)
Established Patient Office Visit  Subjective   [name], female    DOB: 1956-02-01  Age: 67 y.o. MRN: [id number]  Chief Complaint  Patient presents with   Follow-Up Chronic Disease    HPI  Discussed the use of AI scribe software for clinical note transcription with the patient, who gave verbal consent to proceed.  History of Present Illness   The patient, with a history of diabetes, presents with symptoms consistent with COVID-19, including a sore throat, chills, fever, and cough. The symptoms started mildly on Sunday, worsened significantly on Monday, and by Tuesday, the patient felt like she had been hit by a truck. The patient also reports ongoing issues with nausea and lack of weight loss despite taking Ozempic for diabetes management. She expresses dissatisfaction with the medication, stating that it has not helped her lose weight and has only made her feel unwell. Additionally, the patient reports a sharp, random pain in her back, which she describes as feeling like a knife. The pain is located from the middle to the upper part of her back.         ROS per HPI    Objective:     Temp (!) 101.9 F (38.8 C) (Oral)    Physical Exam Vitals reviewed.  Constitutional:      General: She is not in acute distress.    Appearance: Normal appearance. She is well-developed. She is not diaphoretic.  HENT:     Head: Normocephalic and atraumatic.  Eyes:     General: No scleral icterus.    Conjunctiva/sclera: Conjunctivae normal.  Neck:     Thyroid: No thyromegaly.  Cardiovascular:     Rate and Rhythm: Normal rate and regular rhythm.     Heart sounds: Normal heart sounds. No murmur heard. Pulmonary:     Effort: Pulmonary effort is normal. No respiratory distress.     Breath sounds: Normal breath sounds. No wheezing, rhonchi or rales.  Musculoskeletal:     Cervical back: Neck supple.     Right lower leg: No edema.     Left lower leg: No edema.   Lymphadenopathy:     Cervical: No cervical adenopathy.  Skin:    General: Skin is warm and dry.     Findings: No rash.  Neurological:     Mental Status: She is alert and oriented to person, place, and time. Mental status is at baseline.  Psychiatric:        Mood and Affect: Mood normal.        Behavior: Behavior normal.      Results for orders placed or performed in visit on 01/12/23  POC COVID-19  Result Value Ref Range   SARS Coronavirus 2 Ag Positive (A) Negative      The 10-year ASCVD risk score (Arnett DK, et al., 2019) is: 5.4%    Assessment & Plan:   Problem List Items Addressed This Visit       Endocrine   T2DM (type 2 diabetes mellitus) (HCC)    On Ozempic, but experiencing persistent nausea and no weight loss. Discussed the option of switching to Rybelsus. -Stop Ozempic. -Start Rybelsus 7mg  after completion of Paxlovid course.      Relevant Medications   Semaglutide (RYBELSUS) 7 MG TABS   Other Relevant Orders   POCT glycosylated hemoglobin (Hb A1C)   Hemoglobin A1c   Hyperlipidemia associated with type 2 diabetes mellitus (HCC)    On rosuvastatin. -Hold rosuvastatin for five days while  on Paxlovid, then restart.      Relevant Medications   Semaglutide (RYBELSUS) 7 MG TABS   Other Relevant Orders   Lipid panel   Comprehensive metabolic panel   Other Visit Diagnoses     COVID-19    -  Primary   Relevant Medications   nirmatrelvir/ritonavir, renal dosing, (PAXLOVID) 10 x 150 MG & 10 x 100MG  TABS   Other Relevant Orders   POC COVID-19 (Completed)          COVID-19 Day 3 of symptoms including sore throat, chills, fever, and cough. Discussed the option of Paxlovid, potential side effects, and the goal of reducing the risk of hospitalization. -Start Paxlovid, two pills twice a day for five days. -Continue over-the-counter symptom management (Tylenol, ibuprofen, Coricidin, Mucinex). -Return to work after day five, provided fever-free for 24  hours preceding  Back Pain Reports of sharp, random pain in the back, possibly muscular. -Advise on posture work and gentle stretching.  General Health Maintenance -Check A1C, cholesterol levels, and other labs when patient is able. -Schedule follow-up appointment in three months.       Return in about 3 months (around 04/14/2023) for chronic disease f/u.    Shirlee Latch, MD

## 2023-01-13 DIAGNOSIS — E1169 Type 2 diabetes mellitus with other specified complication: Secondary | ICD-10-CM | POA: Diagnosis not present

## 2023-01-13 DIAGNOSIS — E1129 Type 2 diabetes mellitus with other diabetic kidney complication: Secondary | ICD-10-CM | POA: Diagnosis not present

## 2023-01-13 DIAGNOSIS — E785 Hyperlipidemia, unspecified: Secondary | ICD-10-CM | POA: Diagnosis not present

## 2023-01-13 DIAGNOSIS — R809 Proteinuria, unspecified: Secondary | ICD-10-CM | POA: Diagnosis not present

## 2023-01-14 ENCOUNTER — Other Ambulatory Visit: Payer: Self-pay

## 2023-01-14 ENCOUNTER — Ambulatory Visit: Payer: Self-pay | Admitting: *Deleted

## 2023-01-14 DIAGNOSIS — N289 Disorder of kidney and ureter, unspecified: Secondary | ICD-10-CM

## 2023-01-14 DIAGNOSIS — R17 Unspecified jaundice: Secondary | ICD-10-CM

## 2023-01-14 LAB — COMPREHENSIVE METABOLIC PANEL
ALT: 24 [IU]/L (ref 0–32)
AST: 52 [IU]/L — ABNORMAL HIGH (ref 0–40)
Albumin: 3.9 g/dL (ref 3.9–4.9)
Alkaline Phosphatase: 121 [IU]/L (ref 44–121)
BUN/Creatinine Ratio: 16 (ref 12–28)
BUN: 23 mg/dL (ref 8–27)
Bilirubin Total: 1.4 mg/dL — ABNORMAL HIGH (ref 0.0–1.2)
CO2: 18 mmol/L — ABNORMAL LOW (ref 20–29)
Calcium: 9.4 mg/dL (ref 8.7–10.3)
Chloride: 106 mmol/L (ref 96–106)
Creatinine, Ser: 1.42 mg/dL — ABNORMAL HIGH (ref 0.57–1.00)
Globulin, Total: 3 g/dL (ref 1.5–4.5)
Glucose: 162 mg/dL — ABNORMAL HIGH (ref 70–99)
Potassium: 4.6 mmol/L (ref 3.5–5.2)
Sodium: 141 mmol/L (ref 134–144)
Total Protein: 6.9 g/dL (ref 6.0–8.5)
eGFR: 41 mL/min/{1.73_m2} — ABNORMAL LOW (ref >59)

## 2023-01-14 LAB — LIPID PANEL
Chol/HDL Ratio: 2.4 {ratio} (ref 0.0–4.4)
Cholesterol, Total: 126 mg/dL (ref 100–199)
HDL: 53 mg/dL (ref >39)
LDL Chol Calc (NIH): 49 mg/dL (ref 0–99)
Triglycerides: 140 mg/dL (ref 0–149)
VLDL Cholesterol Cal: 24 mg/dL (ref 5–40)

## 2023-01-14 LAB — HEMOGLOBIN A1C
Est. average glucose Bld gHb Est-mCnc: 169 mg/dL
Hgb A1c MFr Bld: 7.5 % — ABNORMAL HIGH (ref 4.8–5.6)

## 2023-01-14 NOTE — Telephone Encounter (Signed)
  Chief Complaint: medication question  Disposition: [] ED /[] Urgent Care (no appt availability in office) / [] Appointment(In office/virtual)/ []  [facility]/ [x] Home Care/ [] Refused Recommended Disposition /[] [facility]/ []  Follow-up with PCP Additional Notes: Patient is requesting clarification on medications she was told to stop/hold while taking Paxlovid  Advised: -Hold rosuvastatin for five days while on Paxlovid, then restart. Stop Ozempic. -Start Rybelsus 7mg  after completion of Paxlovid course.  Reason for Disposition . Caller has medicine question only, adult not sick, AND triager answers question  Protocols used: Medication Question Call-A-AH

## 2023-01-14 NOTE — Telephone Encounter (Signed)
Answer Assessment - Initial Assessment Questions 1. NAME of MEDICINE: "What medicine(s) are you calling about?"     Patient wants to know what medication she was advised to stop while taking Paxlovid  2. QUESTION: "What is your question?" (e.g., double dose of medicine, side effect)     -Hold rosuvastatin for five days while on Paxlovid, then restart.    Stop Ozempic. -Start Rybelsus 7mg  after completion of Paxlovid course.   3. PRESCRIBER: "Who prescribed the medicine?" Reason: if prescribed by specialist, call should be referred to that group.     PCP 4. SYMPTOMS: "Do you have any symptoms?" If Yes, ask: "What symptoms are you having?"  "How bad are the symptoms (e.g., mild, moderate, severe)     COVID- patient reports she still feels bad- but fever is gone  Protocols used: Medication Question Call-A-AH

## 2023-01-19 ENCOUNTER — Other Ambulatory Visit: Payer: Self-pay

## 2023-01-19 MED ORDER — FLUCONAZOLE 150 MG PO TABS
150.0000 mg | ORAL_TABLET | Freq: Once | ORAL | 0 refills | Status: AC
  Filled 2023-01-19: qty 2, 3d supply, fill #0

## 2023-01-19 NOTE — Addendum Note (Signed)
Addended by: Erasmo Downer on: 01/19/2023 04:59 PM   Modules accepted: Orders

## 2023-01-20 ENCOUNTER — Other Ambulatory Visit: Payer: Self-pay

## 2023-01-21 ENCOUNTER — Other Ambulatory Visit: Payer: Self-pay | Admitting: Family Medicine

## 2023-01-21 ENCOUNTER — Other Ambulatory Visit: Payer: Self-pay

## 2023-01-21 MED ORDER — RYBELSUS 3 MG PO TABS
3.0000 mg | ORAL_TABLET | Freq: Every day | ORAL | 1 refills | Status: DC
  Filled 2023-01-21: qty 90, 90d supply, fill #0

## 2023-02-22 ENCOUNTER — Other Ambulatory Visit: Payer: Self-pay

## 2023-02-23 ENCOUNTER — Ambulatory Visit: Payer: Commercial Managed Care - PPO | Admitting: Family Medicine

## 2023-02-23 ENCOUNTER — Other Ambulatory Visit: Payer: Self-pay

## 2023-02-23 ENCOUNTER — Encounter: Payer: Self-pay | Admitting: Family Medicine

## 2023-02-23 ENCOUNTER — Encounter: Payer: Self-pay | Admitting: Oncology

## 2023-02-23 VITALS — BP 112/71 | HR 80 | Temp 99.0°F | Ht 64.0 in | Wt 215.6 lb

## 2023-02-23 DIAGNOSIS — R809 Proteinuria, unspecified: Secondary | ICD-10-CM | POA: Diagnosis not present

## 2023-02-23 DIAGNOSIS — J0140 Acute pansinusitis, unspecified: Secondary | ICD-10-CM

## 2023-02-23 DIAGNOSIS — E1129 Type 2 diabetes mellitus with other diabetic kidney complication: Secondary | ICD-10-CM

## 2023-02-23 DIAGNOSIS — R052 Subacute cough: Secondary | ICD-10-CM | POA: Diagnosis not present

## 2023-02-23 MED ORDER — GUAIFENESIN-CODEINE 100-10 MG/5ML PO SOLN
10.0000 mL | Freq: Three times a day (TID) | ORAL | 0 refills | Status: DC | PRN
  Filled 2023-02-23: qty 120, 4d supply, fill #0

## 2023-02-23 MED ORDER — AMOXICILLIN-POT CLAVULANATE 875-125 MG PO TABS
1.0000 | ORAL_TABLET | Freq: Two times a day (BID) | ORAL | 0 refills | Status: AC
  Filled 2023-02-23: qty 14, 7d supply, fill #0

## 2023-02-23 NOTE — Assessment & Plan Note (Signed)
Previously on Ozempic, switched to Rybelsus due to nausea. Discussed the potential benefits of Rybelsus, including a lower daily dose that may reduce side effects compared to weekly Ozempic. Explained that Rybelsus can be discontinued quickly if side effects occur, unlike Ozempic which remains in the system longer. Discussed the use of a savings card to reduce the cost of Rybelsus to $10. - Provide a one-month sample of Rybelsus 3 mg daily - sample given today - Advise taking Rybelsus with food to minimize nausea - Discuss the use of a savings card to reduce the cost of Rybelsus to $10 - Monitor response to Rybelsus and adjust treatment as necessary

## 2023-02-23 NOTE — Progress Notes (Signed)
Acute Office Visit  Subjective:     [name], female    DOB: [birth year], 67 y.o.   MRN: [id number]  Chief Complaint  Patient presents with   Sinus Problem    Nasal congestion with green phlegm as well as cough. Reports covid the first of October and couldn't get rid of the cough. Patient reports she noticed her phlegm turn green Thursday evening. Patient home covid test was negative on Thursday. Patient has been taking delsym DM, tylenol and using cough drops.     Sinus Problem   Discussed the use of AI scribe software for clinical note transcription with the patient, who gave verbal consent to proceed.  History of Present Illness   The patient, with a history of COVID-19 infection and diabetes, presents with persistent symptoms since the beginning of October. Initially, they experienced bleeding from the left nostril, which progressed to coughing up mucus and nasal discharge from both nostrils. The patient describes the congestion as thick and mentions difficulty sleeping due to persistent coughing. Over-the-counter cough suppressants have not been effective. The patient also reports low-grade fever.  In addition to the respiratory symptoms, the patient discusses their diabetes management. They were previously on Ozempic, but it was discontinued due to nausea. The patient expresses concern about potential nausea with a new medication, Rybelsus.       ROS      Objective:    BP 112/71 (BP Location: Left Arm, Patient Position: Sitting, Cuff Size: Normal)   Pulse 80   Temp 99 F (37.2 C) (Oral)   Ht 5\' 4"  (1.626 m)   Wt 215 lb 9.6 oz (97.8 kg)   BMI 37.01 kg/m    Physical Exam Vitals reviewed.  Constitutional:      General: She is not in acute distress.    Appearance: Normal appearance. She is well-developed. She is not diaphoretic.  HENT:     Head: Normocephalic and atraumatic.     Right Ear: Tympanic membrane, ear canal and external ear normal.      Left Ear: Tympanic membrane, ear canal and external ear normal.     Nose: Congestion present.     Mouth/Throat:     Mouth: Mucous membranes are moist.     Pharynx: Oropharynx is clear. No oropharyngeal exudate.  Eyes:     General: No scleral icterus.    Conjunctiva/sclera: Conjunctivae normal.     Pupils: Pupils are equal, round, and reactive to light.  Cardiovascular:     Rate and Rhythm: Normal rate and regular rhythm.     Pulses: Normal pulses.     Heart sounds: Normal heart sounds. No murmur heard. Pulmonary:     Effort: Pulmonary effort is normal. No respiratory distress.     Breath sounds: Normal breath sounds. No wheezing or rales.  Musculoskeletal:     Cervical back: Neck supple.     Right lower leg: No edema.     Left lower leg: No edema.  Lymphadenopathy:     Cervical: No cervical adenopathy.  Skin:    General: Skin is warm and dry.     Findings: No rash.  Neurological:     Mental Status: She is alert.     No results found for any visits on 02/23/23.      Assessment & Plan:   Problem List Items Addressed This Visit       Endocrine   T2DM (type 2 diabetes mellitus) (HCC)    Previously on  Ozempic, switched to Rybelsus due to nausea. Discussed the potential benefits of Rybelsus, including a lower daily dose that may reduce side effects compared to weekly Ozempic. Explained that Rybelsus can be discontinued quickly if side effects occur, unlike Ozempic which remains in the system longer. Discussed the use of a savings card to reduce the cost of Rybelsus to $10. - Provide a one-month sample of Rybelsus 3 mg daily - sample given today - Advise taking Rybelsus with food to minimize nausea - Discuss the use of a savings card to reduce the cost of Rybelsus to $10 - Monitor response to Rybelsus and adjust treatment as necessary      Other Visit Diagnoses     Acute non-recurrent pansinusitis    -  Primary   Relevant Medications   amoxicillin-clavulanate (AUGMENTIN)  875-125 MG tablet   guaiFENesin-codeine 100-10 MG/5ML syrup   Subacute cough               Sinusitis Persistent sinus congestion and drainage since early October following a COVID-19 infection. Symptoms include epistaxis, productive cough, postnasal drip, low-grade fever, and sore throat. Examination reveals significant nasal congestion and drainage. Likely bacterial sinusitis given the duration and severity of symptoms. Discussed Augmentin, a broad-spectrum antibiotic, with potential gastrointestinal upset as a side effect. Expected improvement within three days of starting treatment. - Prescribe Augmentin 875 mg twice daily for 7 days - Advise taking Augmentin with food to minimize gastrointestinal upset - Prescribe cough syrup with codeine for nighttime use to aid sleep and reduce cough - Advise caution with codeine use during the day due to potential drowsiness  Follow-up - Follow up to assess the effectiveness of Augmentin and Rybelsus - Advise to contact the clinic if symptoms persist or worsen.        Meds ordered this encounter  Medications   amoxicillin-clavulanate (AUGMENTIN) 875-125 MG tablet    Sig: Take 1 tablet by mouth 2 (two) times daily for 7 days.    Dispense:  14 tablet    Refill:  0   guaiFENesin-codeine 100-10 MG/5ML syrup    Sig: Take 10 mLs by mouth 3 (three) times daily as needed for cough.    Dispense:  120 mL    Refill:  0    No follow-ups on file.  Shirlee Latch, MD

## 2023-03-05 ENCOUNTER — Other Ambulatory Visit: Payer: Self-pay | Admitting: Family Medicine

## 2023-03-05 ENCOUNTER — Ambulatory Visit: Payer: Self-pay

## 2023-03-05 ENCOUNTER — Other Ambulatory Visit: Payer: Self-pay

## 2023-03-05 ENCOUNTER — Telehealth: Payer: Self-pay | Admitting: Family Medicine

## 2023-03-05 DIAGNOSIS — B379 Candidiasis, unspecified: Secondary | ICD-10-CM

## 2023-03-05 MED ORDER — FLUCONAZOLE 100 MG PO TABS
100.0000 mg | ORAL_TABLET | Freq: Every day | ORAL | 0 refills | Status: DC
  Filled 2023-03-05: qty 1, 1d supply, fill #0

## 2023-03-05 NOTE — Telephone Encounter (Signed)
Sent under separate request

## 2023-03-05 NOTE — Telephone Encounter (Signed)
Patient called, left VM to return the call to the office to speak to the NT.    Summary: Cindy Trujillo issues from antibiotic   The patient called in stating she is experiencing symptoms from the antibiotics she was on. She is experiencing urinary frequency, burning and urgency. She has requested a prescription for Diflucan if possible. Please assist patient further as she received 2 last time which really helped.  [facility] Phone: [phone] Fax: [phone]   Please assist patient further.

## 2023-03-05 NOTE — Telephone Encounter (Signed)
Patient has called to follow up on requesting medication Diflucan to be called in for her. Patient would like a return call about this today 03/05/23. Please see NT Telephone encounter from today 03/05/23 regarding this.  Patients callback # [phone]

## 2023-03-05 NOTE — Addendum Note (Signed)
Addended by: Jacquenette Shone on: 03/05/2023 04:15 PM   Modules accepted: Orders

## 2023-03-05 NOTE — Telephone Encounter (Signed)
    Chief Complaint: Pt. On antibiotic for sinus issues and now has vaginal yeast symptoms. Burning, itching,no discharge. Asking for Diflucan. Symptoms: Above Frequency: Tuesday Pertinent Negatives: Patient denies  Disposition: [] ED /[] Urgent Care (no appt availability in office) / [] Appointment(In office/virtual)/ []  [facility]/ [] Home Care/ [] Refused Recommended Disposition /[] [facility]/ [x]  Follow-up with PCP Additional Notes: Please advise pt.  Reason for Disposition  [1] Symptoms of a yeast infection (i.e., itchy, white discharge, not bad smelling) AND [2] not improved > 3 days following Care Advice  Answer Assessment - Initial Assessment Questions 1. SYMPTOM: "What's the main symptom you're concerned about?" (e.g., pain, itching, dryness)     Itching, burning 2. LOCATION: "Where is the   located?" (e.g., inside/outside, left/right)     Both 3. ONSET: "When did the    start?"     Tuesday  4. PAIN: "Is there any pain?" If Yes, ask: "How bad is it?" (Scale: 1-10; mild, moderate, severe)   -  MILD (1-3): Doesn't interfere with normal activities.    -  MODERATE (4-7): Interferes with normal activities (e.g., work or school) or awakens from sleep.     -  SEVERE (8-10): Excruciating pain, unable to do any normal activities.     Mild 5. ITCHING: "Is there any itching?" If Yes, ask: "How bad is it?" (Scale: 1-10; mild, moderate, severe)     9 6. CAUSE: "What do you think is causing the discharge?" "Have you had the same problem before? What happened then?"     Antibiotics and yeast 7. OTHER SYMPTOMS: "Do you have any other symptoms?" (e.g., fever, itching, vaginal bleeding, pain with urination, injury to genital area, vaginal foreign body)     No 8. PREGNANCY: "Is there any chance you are pregnant?" "When was your last menstrual period?"     No  Protocols used: Vaginal Symptoms-A-AH

## 2023-03-05 NOTE — Progress Notes (Unsigned)
diflu

## 2023-03-31 ENCOUNTER — Emergency Department: Payer: Commercial Managed Care - PPO

## 2023-03-31 ENCOUNTER — Emergency Department
Admission: EM | Admit: 2023-03-31 | Discharge: 2023-04-01 | Disposition: A | Discharge status: Other Acute Inpatient Hospital | Payer: Commercial Managed Care - PPO | Attending: Emergency Medicine | Admitting: Emergency Medicine

## 2023-03-31 ENCOUNTER — Other Ambulatory Visit: Payer: Self-pay

## 2023-03-31 DIAGNOSIS — K838 Other specified diseases of biliary tract: Secondary | ICD-10-CM | POA: Diagnosis not present

## 2023-03-31 DIAGNOSIS — R1011 Right upper quadrant pain: Secondary | ICD-10-CM | POA: Diagnosis present

## 2023-03-31 DIAGNOSIS — K5730 Diverticulosis of large intestine without perforation or abscess without bleeding: Secondary | ICD-10-CM | POA: Diagnosis not present

## 2023-03-31 DIAGNOSIS — R161 Splenomegaly, not elsewhere classified: Secondary | ICD-10-CM | POA: Diagnosis not present

## 2023-03-31 DIAGNOSIS — R5383 Other fatigue: Secondary | ICD-10-CM | POA: Diagnosis not present

## 2023-03-31 DIAGNOSIS — L299 Pruritus, unspecified: Secondary | ICD-10-CM | POA: Insufficient documentation

## 2023-03-31 DIAGNOSIS — K766 Portal hypertension: Secondary | ICD-10-CM | POA: Diagnosis not present

## 2023-03-31 DIAGNOSIS — K8050 Calculus of bile duct without cholangitis or cholecystitis without obstruction: Secondary | ICD-10-CM

## 2023-03-31 DIAGNOSIS — R410 Disorientation, unspecified: Secondary | ICD-10-CM | POA: Diagnosis not present

## 2023-03-31 DIAGNOSIS — N200 Calculus of kidney: Secondary | ICD-10-CM | POA: Diagnosis not present

## 2023-03-31 DIAGNOSIS — R748 Abnormal levels of other serum enzymes: Secondary | ICD-10-CM

## 2023-03-31 DIAGNOSIS — K828 Other specified diseases of gallbladder: Secondary | ICD-10-CM | POA: Diagnosis not present

## 2023-03-31 DIAGNOSIS — R188 Other ascites: Secondary | ICD-10-CM | POA: Diagnosis not present

## 2023-03-31 DIAGNOSIS — K7469 Other cirrhosis of liver: Secondary | ICD-10-CM | POA: Diagnosis not present

## 2023-03-31 DIAGNOSIS — K7460 Unspecified cirrhosis of liver: Secondary | ICD-10-CM | POA: Diagnosis not present

## 2023-03-31 DIAGNOSIS — K7689 Other specified diseases of liver: Secondary | ICD-10-CM | POA: Diagnosis not present

## 2023-03-31 DIAGNOSIS — N189 Chronic kidney disease, unspecified: Secondary | ICD-10-CM | POA: Diagnosis not present

## 2023-03-31 DIAGNOSIS — R17 Unspecified jaundice: Secondary | ICD-10-CM | POA: Diagnosis not present

## 2023-03-31 DIAGNOSIS — K745 Biliary cirrhosis, unspecified: Secondary | ICD-10-CM

## 2023-03-31 DIAGNOSIS — R932 Abnormal findings on diagnostic imaging of liver and biliary tract: Secondary | ICD-10-CM | POA: Diagnosis not present

## 2023-03-31 DIAGNOSIS — I129 Hypertensive chronic kidney disease with stage 1 through stage 4 chronic kidney disease, or unspecified chronic kidney disease: Secondary | ICD-10-CM | POA: Diagnosis not present

## 2023-03-31 LAB — URINALYSIS, ROUTINE W REFLEX MICROSCOPIC
Glucose, UA: >=500 mg/dL — AB
Hgb urine dipstick: NEGATIVE
Ketones, ur: NEGATIVE mg/dL
Leukocytes,Ua: NEGATIVE
Nitrite: NEGATIVE
Protein, ur: NEGATIVE mg/dL
Specific Gravity, Urine: 1.025 (ref 1.005–1.030)
pH: 5 (ref 5.0–8.0)

## 2023-03-31 LAB — CBC WITH DIFFERENTIAL/PLATELET
Abs Immature Granulocytes: 0.01 10*3/uL (ref 0.00–0.07)
Basophils Absolute: 0 10*3/uL (ref 0.0–0.1)
Basophils Relative: 0 %
Eosinophils Absolute: 0.1 10*3/uL (ref 0.0–0.5)
Eosinophils Relative: 2 %
HCT: 34.2 % — ABNORMAL LOW (ref 36.0–46.0)
Hemoglobin: 11 g/dL — ABNORMAL LOW (ref 12.0–15.0)
Immature Granulocytes: 0 %
Lymphocytes Relative: 20 %
Lymphs Abs: 0.8 10*3/uL (ref 0.7–4.0)
MCH: 29.8 pg (ref 26.0–34.0)
MCHC: 32.2 g/dL (ref 30.0–36.0)
MCV: 92.7 fL (ref 80.0–100.0)
Monocytes Absolute: 0.3 10*3/uL (ref 0.1–1.0)
Monocytes Relative: 8 %
Neutro Abs: 2.7 10*3/uL (ref 1.7–7.7)
Neutrophils Relative %: 70 %
Platelets: 95 10*3/uL — ABNORMAL LOW (ref 150–400)
RBC: 3.69 MIL/uL — ABNORMAL LOW (ref 3.87–5.11)
RDW: 17.7 % — ABNORMAL HIGH (ref 11.5–15.5)
Smear Review: NORMAL
WBC: 3.9 10*3/uL — ABNORMAL LOW (ref 4.0–10.5)
nRBC: 0 % (ref 0.0–0.2)

## 2023-03-31 LAB — APTT: aPTT: 32 s (ref 24–36)

## 2023-03-31 LAB — COMPREHENSIVE METABOLIC PANEL
ALT: 49 U/L — ABNORMAL HIGH (ref 0–44)
AST: 105 U/L — ABNORMAL HIGH (ref 15–41)
Albumin: 2.9 g/dL — ABNORMAL LOW (ref 3.5–5.0)
Alkaline Phosphatase: 232 U/L — ABNORMAL HIGH (ref 38–126)
Anion gap: 13 (ref 5–15)
BUN: 30 mg/dL — ABNORMAL HIGH (ref 8–23)
CO2: 15 mmol/L — ABNORMAL LOW (ref 22–32)
Calcium: 8.9 mg/dL (ref 8.9–10.3)
Chloride: 106 mmol/L (ref 98–111)
Creatinine, Ser: 1.42 mg/dL — ABNORMAL HIGH (ref 0.44–1.00)
GFR, Estimated: 41 mL/min — ABNORMAL LOW (ref >60)
Glucose, Bld: 218 mg/dL — ABNORMAL HIGH (ref 70–99)
Potassium: 4.3 mmol/L (ref 3.5–5.1)
Sodium: 134 mmol/L — ABNORMAL LOW (ref 135–145)
Total Bilirubin: 16.6 mg/dL — ABNORMAL HIGH (ref <1.2)
Total Protein: 7.3 g/dL (ref 6.5–8.1)

## 2023-03-31 LAB — LIPASE, BLOOD: Lipase: 398 U/L — ABNORMAL HIGH (ref 11–51)

## 2023-03-31 LAB — PROTIME-INR
INR: 1.3 — ABNORMAL HIGH (ref 0.8–1.2)
Prothrombin Time: 16.2 s — ABNORMAL HIGH (ref 11.4–15.2)

## 2023-03-31 MED ORDER — SODIUM CHLORIDE 0.9 % IV SOLN: Freq: Once | INTRAVENOUS | Status: AC

## 2023-03-31 MED ORDER — GADOBUTROL 1 MMOL/ML IV SOLN
10.0000 mL | Freq: Once | INTRAVENOUS | Status: AC | PRN
  Administered 2023-03-31: 10 mL via INTRAVENOUS

## 2023-03-31 MED ORDER — IOHEXOL 300 MG/ML  SOLN
100.0000 mL | Freq: Once | INTRAMUSCULAR | Status: AC | PRN
  Administered 2023-03-31: 100 mL via INTRAVENOUS

## 2023-03-31 MED ORDER — SODIUM CHLORIDE 0.9 % IV SOLN
1.0000 g | Freq: Once | INTRAVENOUS | Status: AC
  Administered 2023-03-31: 1 g via INTRAVENOUS
  Filled 2023-03-31: qty 10

## 2023-03-31 NOTE — ED Triage Notes (Signed)
Pt presents to ED with multiple complaints including N/V and inconsistent BM consistency, pt's skin color appears yellow in color. Pt denies fevers. Pt state she feels weak "all over". Pt states some R sided ABD pain.

## 2023-03-31 NOTE — ED Provider Triage Note (Signed)
Emergency Medicine Provider Triage Evaluation Note  Cindy Trujillo , a 67 y.o. female  was evaluated in triage.  Pt complains of nausea and vomiting x2 weeks, feels she looks "jaundiced" x1 week, and very fatigued x2 months, decreased appetite, yellow stool. Had a liver biopsy 1 year ago. Reports that she is itchy.   Review of Systems  Positive: Abd pain, vomiting Negative: fever  Physical Exam  There were no vitals taken for this visit. Gen:   Awake, no distress   Resp:  Normal effort  MSK:   Moves extremities without difficulty  Other:    Medical Decision Making  Medically screening exam initiated at 12:55 PM.  Appropriate orders placed.  Cindy Trujillo was informed that the remainder of the evaluation will be completed by another provider, this initial triage assessment does not replace that evaluation, and the importance of remaining in the ED until their evaluation is complete.     Jackelyn Hoehn, PA-C 03/31/23 1300

## 2023-03-31 NOTE — ED Provider Notes (Signed)
-----------------------------------------   11:28 PM on 03/31/2023 -----------------------------------------  Assuming care from Dr. Derrill Kay.  In short, Cindy Trujillo is a 67 y.o. female with a chief complaint of jaundice, abdominal pain, nausea and vomiting.  Refer to the original H&P for additional details.  The current plan of care is to transfer the patient to another facility.  I viewed and interpreted the patient's MRCP and I cannot appreciate any sign of cholecystitis.  However the radiologist is indicating a 3 mm distal common bile duct stone consistent with choledocholithiasis.  Given the patient's hyperbilirubinemia, elevated lipase, transaminitis, and constellation of symptoms, this is consistent with choledocholithiasis as seemingly confirmed on MRCP.  I confirmed that Dr. Servando Snare will not be available at [facility] for about a week.  Family indicated their preference for transfer to [facility].  If [name] is unable to accept her, her preferences are, in order, [facility] and [place].  I have requested that my secretary, Rivka Barbara, reach out to [facility] to discuss transfer.   Clinical Course as of 04/01/23 0655  Wed Mar 31, 2023  2349 Contacting [facility], discussed case, awaiting callback from GI specialist at [facility] to discuss transfer for possible ERCP and additional evaluation. [CF]  Thu Apr 01, 2023  0011 Consulted with Dr. Lavon Paganini who is covering GI at [facility]  She agreed that the transfer was appropriate and confirmed that she or her service will see the patient once transferred to [facility].  [facility] is now paging the hospitalist service. [CF]  0030 Consulted with Dr. Lyda Perone with the hospitalist service at [facility].  He accepts the patient as a transfer either to [facility] or [facility].  We awaiting the next available bed.  Updated family about the acceptance for transfer and now the wait for a bed assignment. [CF]  1761 Patient stable overnight.  Still awaiting bed at  [facility] or [facility].  Reportedly [facility] may have an update after 10 AM. [CF]    Clinical Course User Index [CF] Loleta Rose, MD     Medications  0.9 %  sodium chloride infusion (has no administration in time range)  iohexol (OMNIPAQUE) 300 MG/ML solution 100 mL (100 mLs Intravenous Contrast Given 03/31/23 1928)  gadobutrol (GADAVIST) 1 MMOL/ML injection 10 mL (10 mLs Intravenous Contrast Given 03/31/23 2258)  0.9 %  sodium chloride infusion ( Intravenous New Bag/Given 03/31/23 2355)  cefTRIAXone (ROCEPHIN) 1 g in sodium chloride 0.9 % 100 mL IVPB (1 g Intravenous New Bag/Given 03/31/23 2354)     ED Discharge Orders     None      Final diagnoses:  Choledocholithiasis  Hyperbilirubinemia  Elevated lipase  Chronic kidney disease, unspecified CKD stage  Biliary sludge  Biliary cirrhosis (HCC)     Loleta Rose, MD 04/01/23 [phone]

## 2023-03-31 NOTE — ED Provider Notes (Signed)
[facility] Provider Note    Event Date/Time   First MD Initiated Contact with Patient 03/31/23 1817     (approximate)   History   multiple complaints   HPI  MALANA ABDELFATTAH is a 67 y.o. female who presents to the emergency department today because of concerns for jaundice, fatigue, abdominal pain.  Symptoms started roughly 1 month ago.  They have been progressive.  Came in today when she also noticed some confusion.  In addition she has noticed change in her stool.  She has had some itchiness.  Denies history of liver disease. Patient denies excessive alcohol or tylenol use.     Physical Exam   Triage Vital Signs: ED Triage Vitals  Encounter Vitals Group     BP 03/31/23 1259 136/74     Systolic BP Percentile --      Diastolic BP Percentile --      Pulse Rate 03/31/23 1259 86     Resp 03/31/23 1259 18     Temp 03/31/23 1259 98.5 F (36.9 C)     Temp Source 03/31/23 1259 Oral     SpO2 03/31/23 1259 100 %     Weight 03/31/23 1300 208 lb (94.3 kg)     Height 03/31/23 1300 5\' 4"  (1.626 m)     Head Circumference --      Peak Flow --      Pain Score 03/31/23 1259 0     Pain Loc --      Pain Education --      Exclude from Growth Chart --     Most recent vital signs: Vitals:   03/31/23 1259 03/31/23 1759  BP: 136/74 115/60  Pulse: 86 83  Resp: 18 18  Temp: 98.5 F (36.9 C) 98 F (36.7 C)  SpO2: 100% 95%   General: Awake, alert, oriented. CV:  Good peripheral perfusion. Regular rate and rhythm. Resp:  Normal effort. Lungs clear. Abd:  No distention. Tender to palpation in RUQ Other:  Juand[place]. Scleral icterus.     ED Results / Procedures / Treatments   Labs (all labs ordered are listed, but only abnormal results are displayed) Labs Reviewed  COMPREHENSIVE METABOLIC PANEL - Abnormal; Notable for the following components:      Result Value   Sodium 134 (*)    CO2 15 (*)    Glucose, Bld 218 (*)    BUN 30 (*)    Creatinine, Ser 1.42  (*)    Albumin 2.9 (*)    AST 105 (*)    ALT 49 (*)    Alkaline Phosphatase 232 (*)    Total Bilirubin 16.6 (*)    GFR, Estimated 41 (*)    All other components within normal limits  LIPASE, BLOOD - Abnormal; Notable for the following components:   Lipase 398 (*)    All other components within normal limits  CBC WITH DIFFERENTIAL/PLATELET - Abnormal; Notable for the following components:   WBC 3.9 (*)    RBC 3.69 (*)    Hemoglobin 11.0 (*)    HCT 34.2 (*)    RDW 17.7 (*)    Platelets 95 (*)    All other components within normal limits  URINALYSIS, ROUTINE W REFLEX MICROSCOPIC - Abnormal; Notable for the following components:   Color, Urine AMBER (*)    APPearance CLEAR (*)    Glucose, UA >=500 (*)    Bilirubin Urine SMALL (*)    Bacteria, UA RARE (*)  All other components within normal limits     EKG  None   RADIOLOGY I independently interpreted and visualized the RUQ [place]. My interpretation: Distended gallbladder. Radiology interpretation:  IMPRESSION:  Nodular heterogeneous liver. Question some varices. Trace ascites  suggested.    Distended gallbladder with sludge. Mild biliary duct ectasia.  Recommend further workup with CT to further delineate these findings  when appropriate   I independently interpreted and visualized the CT abd/pel. My interpretation: No free air. No large mass. Radiology interpretation:  IMPRESSION:  Cirrhosis.  No findings suspicious for HCC    Mild central intrahepatic and extrahepatic ductal dilatation. Common  duct measuring up to 15 mm. In the setting of abnormal LFTs,  consider ERCP or MRCP for further evaluation, as clinically  warranted.    Additional ancillary findings as above.      PROCEDURES:  Critical Care performed: No   MEDICATIONS ORDERED IN ED: Medications - No data to display   IMPRESSION / MDM / ASSESSMENT AND PLAN / ED COURSE  I reviewed the triage vital signs and the nursing notes.                               Differential diagnosis includes, but is not limited to, liver failure, choledocholithiasis, neoplasm  Patient's presentation is most consistent with acute presentation with potential threat to life or bodily function.  Patient presented to the emergency department today with multiple complaints concerning for liver issues.  On exam patient is significantly jaundiced.  She does have some tenderness in the right upper quadrant.  Blood work without any leukocytosis.  Does show significantly elevated bilirubin. RUQ [place] was ordered from triage. Did order CT to further evaluate the findings.   CT scan shows cirrhosis. Also ductal dilation. Will obtain MRCP to evaluate for possible choledocholithiasis.       FINAL CLINICAL IMPRESSION(S) / ED DIAGNOSES   Final diagnoses:  Elevated bilirubin     Note:  This document was prepared using Dragon voice recognition software and may include unintentional dictation errors.    Phineas Semen, MD 03/31/23 2118

## 2023-04-01 ENCOUNTER — Inpatient Hospital Stay (HOSPITAL_COMMUNITY)
Admission: AD | Admit: 2023-04-01 | Discharge: 2023-04-08 | DRG: 441 | Disposition: A | Discharge status: Other Acute Inpatient Hospital | Payer: Commercial Managed Care - PPO | Source: Other Acute Inpatient Hospital | Attending: Pulmonary Disease | Admitting: Pulmonary Disease

## 2023-04-01 ENCOUNTER — Encounter (HOSPITAL_COMMUNITY): Payer: Self-pay

## 2023-04-01 ENCOUNTER — Encounter (HOSPITAL_COMMUNITY): Payer: Self-pay | Admitting: Internal Medicine

## 2023-04-01 DIAGNOSIS — E785 Hyperlipidemia, unspecified: Secondary | ICD-10-CM | POA: Diagnosis not present

## 2023-04-01 DIAGNOSIS — D61818 Other pancytopenia: Secondary | ICD-10-CM | POA: Diagnosis not present

## 2023-04-01 DIAGNOSIS — Z66 Do not resuscitate: Secondary | ICD-10-CM | POA: Diagnosis present

## 2023-04-01 DIAGNOSIS — R188 Other ascites: Secondary | ICD-10-CM | POA: Diagnosis not present

## 2023-04-01 DIAGNOSIS — K8061 Calculus of gallbladder and bile duct with cholecystitis, unspecified, with obstruction: Secondary | ICD-10-CM | POA: Diagnosis present

## 2023-04-01 DIAGNOSIS — K7682 Hepatic encephalopathy: Secondary | ICD-10-CM | POA: Diagnosis not present

## 2023-04-01 DIAGNOSIS — K8020 Calculus of gallbladder without cholecystitis without obstruction: Secondary | ICD-10-CM | POA: Diagnosis not present

## 2023-04-01 DIAGNOSIS — N179 Acute kidney failure, unspecified: Secondary | ICD-10-CM | POA: Diagnosis not present

## 2023-04-01 DIAGNOSIS — K831 Obstruction of bile duct: Secondary | ICD-10-CM | POA: Diagnosis not present

## 2023-04-01 DIAGNOSIS — N1832 Chronic kidney disease, stage 3b: Secondary | ICD-10-CM | POA: Diagnosis present

## 2023-04-01 DIAGNOSIS — E1122 Type 2 diabetes mellitus with diabetic chronic kidney disease: Secondary | ICD-10-CM | POA: Diagnosis not present

## 2023-04-01 DIAGNOSIS — K5900 Constipation, unspecified: Secondary | ICD-10-CM | POA: Diagnosis present

## 2023-04-01 DIAGNOSIS — Z7189 Other specified counseling: Secondary | ICD-10-CM

## 2023-04-01 DIAGNOSIS — R4182 Altered mental status, unspecified: Secondary | ICD-10-CM | POA: Diagnosis not present

## 2023-04-01 DIAGNOSIS — K7581 Nonalcoholic steatohepatitis (NASH): Secondary | ICD-10-CM | POA: Diagnosis not present

## 2023-04-01 DIAGNOSIS — Y848 Other medical procedures as the cause of abnormal reaction of the patient, or of later complication, without mention of misadventure at the time of the procedure: Secondary | ICD-10-CM | POA: Diagnosis not present

## 2023-04-01 DIAGNOSIS — E119 Type 2 diabetes mellitus without complications: Secondary | ICD-10-CM

## 2023-04-01 DIAGNOSIS — Z90710 Acquired absence of both cervix and uterus: Secondary | ICD-10-CM

## 2023-04-01 DIAGNOSIS — N1831 Chronic kidney disease, stage 3a: Secondary | ICD-10-CM | POA: Diagnosis not present

## 2023-04-01 DIAGNOSIS — I8510 Secondary esophageal varices without bleeding: Secondary | ICD-10-CM | POA: Diagnosis not present

## 2023-04-01 DIAGNOSIS — Z794 Long term (current) use of insulin: Secondary | ICD-10-CM | POA: Diagnosis not present

## 2023-04-01 DIAGNOSIS — R112 Nausea with vomiting, unspecified: Secondary | ICD-10-CM | POA: Diagnosis not present

## 2023-04-01 DIAGNOSIS — R17 Unspecified jaundice: Secondary | ICD-10-CM | POA: Diagnosis present

## 2023-04-01 DIAGNOSIS — Z79899 Other long term (current) drug therapy: Secondary | ICD-10-CM

## 2023-04-01 DIAGNOSIS — R932 Abnormal findings on diagnostic imaging of liver and biliary tract: Secondary | ICD-10-CM | POA: Diagnosis not present

## 2023-04-01 DIAGNOSIS — K7201 Acute and subacute hepatic failure with coma: Secondary | ICD-10-CM | POA: Diagnosis not present

## 2023-04-01 DIAGNOSIS — Z800 Family history of malignant neoplasm of digestive organs: Secondary | ICD-10-CM

## 2023-04-01 DIAGNOSIS — L299 Pruritus, unspecified: Secondary | ICD-10-CM | POA: Diagnosis present

## 2023-04-01 DIAGNOSIS — K7210 Chronic hepatic failure without coma: Secondary | ICD-10-CM | POA: Diagnosis present

## 2023-04-01 DIAGNOSIS — Z86718 Personal history of other venous thrombosis and embolism: Secondary | ICD-10-CM

## 2023-04-01 DIAGNOSIS — D696 Thrombocytopenia, unspecified: Secondary | ICD-10-CM | POA: Diagnosis not present

## 2023-04-01 DIAGNOSIS — R140 Abdominal distension (gaseous): Secondary | ICD-10-CM | POA: Diagnosis not present

## 2023-04-01 DIAGNOSIS — R410 Disorientation, unspecified: Secondary | ICD-10-CM | POA: Diagnosis not present

## 2023-04-01 DIAGNOSIS — Z6841 Body Mass Index (BMI) 40.0 and over, adult: Secondary | ICD-10-CM | POA: Diagnosis not present

## 2023-04-01 DIAGNOSIS — Z7984 Long term (current) use of oral hypoglycemic drugs: Secondary | ICD-10-CM

## 2023-04-01 DIAGNOSIS — E8721 Acute metabolic acidosis: Secondary | ICD-10-CM | POA: Diagnosis present

## 2023-04-01 DIAGNOSIS — I959 Hypotension, unspecified: Secondary | ICD-10-CM | POA: Diagnosis present

## 2023-04-01 DIAGNOSIS — E872 Acidosis, unspecified: Secondary | ICD-10-CM

## 2023-04-01 DIAGNOSIS — K7460 Unspecified cirrhosis of liver: Secondary | ICD-10-CM

## 2023-04-01 DIAGNOSIS — D649 Anemia, unspecified: Secondary | ICD-10-CM | POA: Diagnosis not present

## 2023-04-01 DIAGNOSIS — K767 Hepatorenal syndrome: Secondary | ICD-10-CM | POA: Diagnosis present

## 2023-04-01 DIAGNOSIS — D684 Acquired coagulation factor deficiency: Secondary | ICD-10-CM | POA: Diagnosis present

## 2023-04-01 DIAGNOSIS — Z882 Allergy status to sulfonamides status: Secondary | ICD-10-CM

## 2023-04-01 DIAGNOSIS — E66812 Obesity, class 2: Secondary | ICD-10-CM | POA: Diagnosis present

## 2023-04-01 DIAGNOSIS — K7200 Acute and subacute hepatic failure without coma: Secondary | ICD-10-CM | POA: Diagnosis not present

## 2023-04-01 DIAGNOSIS — Z825 Family history of asthma and other chronic lower respiratory diseases: Secondary | ICD-10-CM

## 2023-04-01 DIAGNOSIS — N183 Chronic kidney disease, stage 3 unspecified: Secondary | ICD-10-CM | POA: Diagnosis not present

## 2023-04-01 DIAGNOSIS — R5383 Other fatigue: Secondary | ICD-10-CM | POA: Diagnosis not present

## 2023-04-01 DIAGNOSIS — K8050 Calculus of bile duct without cholangitis or cholecystitis without obstruction: Principal | ICD-10-CM

## 2023-04-01 DIAGNOSIS — E6609 Other obesity due to excess calories: Secondary | ICD-10-CM | POA: Diagnosis present

## 2023-04-01 DIAGNOSIS — K838 Other specified diseases of biliary tract: Secondary | ICD-10-CM | POA: Diagnosis not present

## 2023-04-01 DIAGNOSIS — Z7982 Long term (current) use of aspirin: Secondary | ICD-10-CM

## 2023-04-01 DIAGNOSIS — K760 Fatty (change of) liver, not elsewhere classified: Secondary | ICD-10-CM | POA: Diagnosis not present

## 2023-04-01 DIAGNOSIS — Z8249 Family history of ischemic heart disease and other diseases of the circulatory system: Secondary | ICD-10-CM

## 2023-04-01 DIAGNOSIS — Z808 Family history of malignant neoplasm of other organs or systems: Secondary | ICD-10-CM

## 2023-04-01 DIAGNOSIS — Z8542 Personal history of malignant neoplasm of other parts of uterus: Secondary | ICD-10-CM

## 2023-04-01 DIAGNOSIS — K766 Portal hypertension: Secondary | ICD-10-CM | POA: Diagnosis present

## 2023-04-01 DIAGNOSIS — K7290 Hepatic failure, unspecified without coma: Secondary | ICD-10-CM | POA: Diagnosis not present

## 2023-04-01 DIAGNOSIS — D509 Iron deficiency anemia, unspecified: Secondary | ICD-10-CM | POA: Diagnosis present

## 2023-04-01 DIAGNOSIS — I8500 Esophageal varices without bleeding: Secondary | ICD-10-CM | POA: Diagnosis not present

## 2023-04-01 DIAGNOSIS — K7469 Other cirrhosis of liver: Secondary | ICD-10-CM | POA: Diagnosis not present

## 2023-04-01 DIAGNOSIS — N19 Unspecified kidney failure: Secondary | ICD-10-CM | POA: Diagnosis not present

## 2023-04-01 DIAGNOSIS — I8501 Esophageal varices with bleeding: Secondary | ICD-10-CM | POA: Diagnosis not present

## 2023-04-01 DIAGNOSIS — E1169 Type 2 diabetes mellitus with other specified complication: Secondary | ICD-10-CM | POA: Diagnosis not present

## 2023-04-01 DIAGNOSIS — E1165 Type 2 diabetes mellitus with hyperglycemia: Secondary | ICD-10-CM | POA: Diagnosis present

## 2023-04-01 DIAGNOSIS — T85510A Breakdown (mechanical) of bile duct prosthesis, initial encounter: Secondary | ICD-10-CM | POA: Diagnosis not present

## 2023-04-01 DIAGNOSIS — D6959 Other secondary thrombocytopenia: Secondary | ICD-10-CM | POA: Diagnosis present

## 2023-04-01 DIAGNOSIS — K828 Other specified diseases of gallbladder: Secondary | ICD-10-CM | POA: Diagnosis present

## 2023-04-01 DIAGNOSIS — Z4659 Encounter for fitting and adjustment of other gastrointestinal appliance and device: Secondary | ICD-10-CM | POA: Diagnosis not present

## 2023-04-01 DIAGNOSIS — Z4682 Encounter for fitting and adjustment of non-vascular catheter: Secondary | ICD-10-CM | POA: Diagnosis not present

## 2023-04-01 DIAGNOSIS — E871 Hypo-osmolality and hyponatremia: Secondary | ICD-10-CM | POA: Diagnosis not present

## 2023-04-01 DIAGNOSIS — I10 Essential (primary) hypertension: Secondary | ICD-10-CM | POA: Diagnosis not present

## 2023-04-01 DIAGNOSIS — Z6837 Body mass index (BMI) 37.0-37.9, adult: Secondary | ICD-10-CM | POA: Diagnosis not present

## 2023-04-01 DIAGNOSIS — K219 Gastro-esophageal reflux disease without esophagitis: Secondary | ICD-10-CM

## 2023-04-01 DIAGNOSIS — K3189 Other diseases of stomach and duodenum: Secondary | ICD-10-CM | POA: Diagnosis present

## 2023-04-01 DIAGNOSIS — Z8616 Personal history of COVID-19: Secondary | ICD-10-CM | POA: Diagnosis not present

## 2023-04-01 DIAGNOSIS — N189 Chronic kidney disease, unspecified: Secondary | ICD-10-CM | POA: Diagnosis not present

## 2023-04-01 DIAGNOSIS — I129 Hypertensive chronic kidney disease with stage 1 through stage 4 chronic kidney disease, or unspecified chronic kidney disease: Secondary | ICD-10-CM | POA: Diagnosis not present

## 2023-04-01 DIAGNOSIS — D631 Anemia in chronic kidney disease: Secondary | ICD-10-CM | POA: Diagnosis not present

## 2023-04-01 DIAGNOSIS — E875 Hyperkalemia: Secondary | ICD-10-CM | POA: Diagnosis present

## 2023-04-01 DIAGNOSIS — A419 Sepsis, unspecified organism: Secondary | ICD-10-CM | POA: Diagnosis not present

## 2023-04-01 DIAGNOSIS — K8051 Calculus of bile duct without cholangitis or cholecystitis with obstruction: Secondary | ICD-10-CM | POA: Diagnosis not present

## 2023-04-01 DIAGNOSIS — G9341 Metabolic encephalopathy: Secondary | ICD-10-CM | POA: Diagnosis not present

## 2023-04-01 LAB — COMPREHENSIVE METABOLIC PANEL
ALT: 47 U/L — ABNORMAL HIGH (ref 0–44)
AST: 97 U/L — ABNORMAL HIGH (ref 15–41)
Albumin: 2.4 g/dL — ABNORMAL LOW (ref 3.5–5.0)
Alkaline Phosphatase: 199 U/L — ABNORMAL HIGH (ref 38–126)
Anion gap: 8 (ref 5–15)
BUN: 27 mg/dL — ABNORMAL HIGH (ref 8–23)
CO2: 17 mmol/L — ABNORMAL LOW (ref 22–32)
Calcium: 8.6 mg/dL — ABNORMAL LOW (ref 8.9–10.3)
Chloride: 112 mmol/L — ABNORMAL HIGH (ref 98–111)
Creatinine, Ser: 1.33 mg/dL — ABNORMAL HIGH (ref 0.44–1.00)
GFR, Estimated: 44 mL/min — ABNORMAL LOW (ref >60)
Glucose, Bld: 135 mg/dL — ABNORMAL HIGH (ref 70–99)
Potassium: 4.3 mmol/L (ref 3.5–5.1)
Sodium: 137 mmol/L (ref 135–145)
Total Bilirubin: 16.4 mg/dL — ABNORMAL HIGH (ref <1.2)
Total Protein: 6.6 g/dL (ref 6.5–8.1)

## 2023-04-01 LAB — PROTIME-INR
INR: 1.4 — ABNORMAL HIGH (ref 0.8–1.2)
Prothrombin Time: 16.9 s — ABNORMAL HIGH (ref 11.4–15.2)

## 2023-04-01 LAB — CBC
HCT: 33.1 % — ABNORMAL LOW (ref 36.0–46.0)
Hemoglobin: 10.9 g/dL — ABNORMAL LOW (ref 12.0–15.0)
MCH: 29.4 pg (ref 26.0–34.0)
MCHC: 32.9 g/dL (ref 30.0–36.0)
MCV: 89.2 fL (ref 80.0–100.0)
Platelets: 94 10*3/uL — ABNORMAL LOW (ref 150–400)
RBC: 3.71 MIL/uL — ABNORMAL LOW (ref 3.87–5.11)
RDW: 18 % — ABNORMAL HIGH (ref 11.5–15.5)
WBC: 3.1 10*3/uL — ABNORMAL LOW (ref 4.0–10.5)
nRBC: 0 % (ref 0.0–0.2)

## 2023-04-01 LAB — GLUCOSE, CAPILLARY
Glucose-Capillary: 119 mg/dL — ABNORMAL HIGH (ref 70–99)
Glucose-Capillary: 155 mg/dL — ABNORMAL HIGH (ref 70–99)
Glucose-Capillary: 179 mg/dL — ABNORMAL HIGH (ref 70–99)

## 2023-04-01 LAB — HEPATITIS PANEL, ACUTE
HCV Ab: NONREACTIVE
Hep A IgM: NONREACTIVE
Hep B C IgM: NONREACTIVE
Hepatitis B Surface Ag: NONREACTIVE

## 2023-04-01 LAB — BILIRUBIN, DIRECT: Bilirubin, Direct: 9 mg/dL — ABNORMAL HIGH (ref 0.0–0.2)

## 2023-04-01 MED ORDER — SODIUM CHLORIDE 0.45 % IV SOLN
INTRAVENOUS | Status: DC
  Filled 2023-04-01 (×3): qty 75

## 2023-04-01 MED ORDER — HYDROMORPHONE HCL 1 MG/ML IJ SOLN
0.5000 mg | INTRAMUSCULAR | Status: DC | PRN
  Administered 2023-04-05 – 2023-04-08 (×2): 0.5 mg via INTRAVENOUS
  Filled 2023-04-01 (×2): qty 0.5

## 2023-04-01 MED ORDER — INSULIN ASPART 100 UNIT/ML IJ SOLN
0.0000 [IU] | Freq: Every day | INTRAMUSCULAR | Status: DC
  Administered 2023-04-02: 3 [IU] via SUBCUTANEOUS
  Administered 2023-04-03 – 2023-04-04 (×2): 2 [IU] via SUBCUTANEOUS
  Administered 2023-04-05: 3 [IU] via SUBCUTANEOUS
  Administered 2023-04-06: 2 [IU] via SUBCUTANEOUS

## 2023-04-01 MED ORDER — OXYCODONE HCL 5 MG PO TABS
5.0000 mg | ORAL_TABLET | ORAL | Status: DC | PRN
  Administered 2023-04-01 – 2023-04-02 (×4): 5 mg via ORAL
  Filled 2023-04-01 (×4): qty 1

## 2023-04-01 MED ORDER — ACETAMINOPHEN 650 MG RE SUPP: 650.0000 mg | Freq: Four times a day (QID) | RECTAL | Status: DC | PRN

## 2023-04-01 MED ORDER — PANTOPRAZOLE SODIUM 40 MG IV SOLR
40.0000 mg | Freq: Every day | INTRAVENOUS | Status: DC
  Administered 2023-04-02 – 2023-04-08 (×7): 40 mg via INTRAVENOUS
  Filled 2023-04-01 (×7): qty 10

## 2023-04-01 MED ORDER — ENOXAPARIN SODIUM 40 MG/0.4ML IJ SOSY: 40.0000 mg | PREFILLED_SYRINGE | INTRAMUSCULAR | Status: DC

## 2023-04-01 MED ORDER — ACETAMINOPHEN 325 MG PO TABS: 650.0000 mg | ORAL_TABLET | Freq: Four times a day (QID) | ORAL | Status: DC | PRN

## 2023-04-01 MED ORDER — BISACODYL 5 MG PO TBEC: 5.0000 mg | DELAYED_RELEASE_TABLET | Freq: Every day | ORAL | Status: DC | PRN

## 2023-04-01 MED ORDER — INSULIN ASPART 100 UNIT/ML IJ SOLN
0.0000 [IU] | Freq: Three times a day (TID) | INTRAMUSCULAR | Status: DC
  Administered 2023-04-01 – 2023-04-02 (×2): 2 [IU] via SUBCUTANEOUS
  Administered 2023-04-02: 1 [IU] via SUBCUTANEOUS
  Administered 2023-04-03: 5 [IU] via SUBCUTANEOUS
  Administered 2023-04-03: 3 [IU] via SUBCUTANEOUS
  Administered 2023-04-03: 5 [IU] via SUBCUTANEOUS
  Administered 2023-04-04: 3 [IU] via SUBCUTANEOUS
  Administered 2023-04-04: 2 [IU] via SUBCUTANEOUS

## 2023-04-01 MED ORDER — POLYETHYLENE GLYCOL 3350 17 G PO PACK
17.0000 g | PACK | Freq: Every day | ORAL | Status: DC | PRN
  Administered 2023-04-05: 17 g via ORAL
  Filled 2023-04-01: qty 1

## 2023-04-01 MED ORDER — ALBUTEROL SULFATE (2.5 MG/3ML) 0.083% IN NEBU: 2.5000 mg | INHALATION_SOLUTION | Freq: Four times a day (QID) | RESPIRATORY_TRACT | Status: DC | PRN

## 2023-04-01 MED ORDER — SODIUM CHLORIDE 0.9 % IV SOLN: Freq: Once | INTRAVENOUS | Status: AC

## 2023-04-01 MED ORDER — HYDRALAZINE HCL 20 MG/ML IJ SOLN: 10.0000 mg | Freq: Four times a day (QID) | INTRAMUSCULAR | Status: DC | PRN

## 2023-04-01 MED ORDER — INSULIN ASPART 100 UNIT/ML IJ SOLN: 0.0000 [IU] | INTRAMUSCULAR | Status: DC

## 2023-04-01 NOTE — Consult Note (Addendum)
Referring Provider: Dr. Lorin Glass Primary Care Physician:  Erasmo Downer, MD Primary Gastroenterologist: Previously Dr. Manfred Shirts at [facility], Dr. Mia Creek at [facility], unassigned   Reason for Consultation: Choledocholithiasis   HPI: Cindy Trujillo is a 67 y.o. 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   She developed nausea and vomiting x 2 weeks with new onset jaundice x 1 week for she presented to [facility] ED for further evaluation.  Labs in the ED showed a WBC count of 3.9.  Hemoglobin 11.0 which is baseline per labs 10/09/2022.  Hematocrit 34.2.  Platelets 95.  Sodium 134.  Potassium 4.3.  BUN 30.  Creatinine 1.42.  Total bili 16.6 up from 1.4 two ago.  Alk phos 232.  AST 105.  ALT 49.  INR 1.3.  Lipase 398.  Urinalysis with small amount of bilirubin.  RUQ sonogram showed a nodular heterogeneous liver with questionable varices, trace ascites, a distended gallbladder with sludge and mild intrahepatic biliary ductal dilatation the patent portal vein.  CTAP with contrast showed evidence of cirrhosis with mild central intrahepatic/extrahepatic ductal dilatation, CBD measuring up to 15 mm splenomegaly mild recannulized periumbilical vein, mild perigastric varices  and trace ascites.  An abdominal MRI/MRCP showed a dilated CBD measuring 14 mm with a suspected 3 mm distal CBD stone, mild layering gallbladder sludge without evidence of acute cholecystitis with evidence of cirrhosis with sequela of portal hypertension without evidence of HCC.  Patient was transferred to [facility] for ERCP.   Labs today: WBC 3.1.  Hemoglobin 10.9.  Platelet 94.  BUN 27.  Creatinine 1.33.  Total bili 16.4.  Direct bili 9.0.  Alk phos 199.  AST 97.  ALT 47.  INR 1.4.  She developed fatigue 2 months ago which she attributed to  having COVID in October and a sinus infection in November.  She has had intermittent nausea and vomiting with nonbloody emesis for the past few months which possibly started after she took Ozempic for 6 weeks then was switched to Rybelsus the end of November.  Last dose of Rybelsus was 2 weeks ago.  She developed RUQ pain 2 weeks ago which has progressively worsened and she noticed her urine was goldenrod in color and her stools were yellow/brown.  No melena or bloody stools.  She developed generalized itchiness 1-1/2 weeks ago.  She has a history of GERD for which she takes Omeprazole 20mg  daily with intermittent heartburn and recent stomach upset.  No dysphagia.  She endorsed undergoing an EGD 08/2017, however, I could not locate these records in care everywhere or epic.  Colonoscopy 08/23/2017 showed diverticulosis, internal hemorrhoids without polyps.  No alcohol use.  Non-smoker.  She takes ASA 81 mg daily, no other NSAID use. She has a history of hepatic steatosis with possible cirrhosis, previously evaluated by GI Dr. Eather Colas at [facility] 02/2022.  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  Father with history of alcohol use disorder and liver cancer, he also had prostate cancer with metastasis.  No history of alcohol use disorder.   GI PROCEDURES:  Colonoscopy 08/23/2017: - Diverticulosis in the sigmoid colon and in the descending colon.  - Internal hemorrhoids.  - The examination was otherwise normal.  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  Past Medical History:  Diagnosis Date   Anemia    Bell's palsy    Diabetes mellitus without complication (HCC)    DVT (deep  venous thrombosis) (HCC) 08/2018   right leg   GERD (gastroesophageal reflux disease)    Hyperlipidemia    Post-menopausal bleeding 09/21/2019    Past Surgical History:  Procedure Laterality Date   COLONOSCOPY WITH PROPOFOL N/A 08/23/2017   Procedure: COLONOSCOPY WITH PROPOFOL;  Surgeon: Scot Jun, MD;  Location: [facility] ENDOSCOPY;  Service: Endoscopy;  Laterality: N/A;   ENDOVENOUS ABLATION SAPHENOUS VEIN W/ LASER Left 02/29/2020   endovenous laser ablation left greater saphenous vein and stab phlebectomy 10-20 incisions left leg by Cari Caraway MD    HYSTEROSCOPY WITH D & C N/A 10/31/2019   Procedure: DILATATION AND CURETTAGE /HYSTEROSCOPY WITH MYOSURE RESECTION;  Surgeon: Natale Milch, MD;  Location: [facility] ORS;  Service: Gynecology;  Laterality: N/A;   ROBOTIC ASSISTED TOTAL HYSTERECTOMY WITH BILATERAL SALPINGO OOPHERECTOMY N/A 12/06/2019   Procedure: XI ROBOTIC ASSISTED TOTAL HYSTERECTOMY WITH BILATERAL SALPINGO OOPHORECTOMY;  Surgeon: Artelia Laroche, MD;  Location: [facility] ORS;  Service: Gynecology;  Laterality: N/A;   SENTINEL NODE BIOPSY N/A 12/06/2019   Procedure: SENTINEL NODE BIOPSY;  Surgeon: Artelia Laroche, MD;  Location: [facility] ORS;  Service: Gynecology;  Laterality: N/A;   TONSILLECTOMY     TONSILLECTOMY AND ADENOIDECTOMY  1968   VEIN SURGERY  05/2019   laser    Prior to Admission medications   Medication Sig Start Date End Date Taking? Authorizing Provider  aspirin EC 81 MG tablet Take 1 tablet (81 mg total) by mouth daily. Swallow whole. 11/15/20  Yes Rickard Patience, MD  dapagliflozin propanediol (FARXIGA) 10 MG TABS tablet Take 1 tablet (10 mg total) by mouth daily. 10/09/22  Yes Bacigalupo, Marzella Schlein, MD  ferrous sulfate 325 (65 FE) MG tablet TAKE 1 TABLET BY MOUTH TWICE DAILY Patient taking differently: Take 325 mg by mouth daily. 07/04/20 04/01/23 Yes Rickard Patience, MD  guaiFENesin-codeine 100-10 MG/5ML syrup Take 10 mLs by mouth 3 (three) times daily as  needed for cough. 02/23/23  Yes Bacigalupo, Marzella Schlein, MD  guaiFENesin-dextromethorphan (ROBITUSSIN DM) 100-10 MG/5ML syrup Take 5 mLs by mouth every 4 (four) hours as needed for cough.   Yes [provider]  lisinopril (ZESTRIL) 5 MG tablet Take 1 tablet (5 mg total) by mouth daily. 10/09/22 10/09/23 Yes Bacigalupo, Marzella Schlein, MD  Magnesium 400 MG TABS Take 1 tablet by mouth 2 (two) times daily.   Yes [provider]  metFORMIN (GLUCOPHAGE) 1000 MG tablet Take 1 tablet (1,000 mg total) by mouth 2 (two) times daily with a meal. 10/09/22 10/09/23 Yes Bacigalupo, Marzella Schlein, MD  omeprazole (PRILOSEC) 20 MG capsule Take 1 capsule (20 mg total) by mouth daily. 04/16/22  Yes Bacigalupo, Marzella Schlein, MD  rosuvastatin (CRESTOR) 5 MG tablet Take 1 tablet (5 mg total) by mouth daily. 10/09/22  Yes Bacigalupo, Marzella Schlein, MD  Semaglutide (RYBELSUS) 3 MG TABS Take 1 tablet (3 mg total) by mouth daily. Patient not taking: Reported on 02/23/2023 01/21/23   Erasmo Downer, MD  vitamin B-12 (CYANOCOBALAMIN) 1000 MCG tablet Take 1 tablet (1,000 mcg total) by mouth daily. 03/21/19  Yes Rickard Patience, MD  VITAMIN E PO Take 1 tablet by mouth daily.  Yes [provider]  fluconazole (DIFLUCAN) 100 MG tablet Take 1 tablet (100 mg total) by mouth daily. Patient not taking: Reported on 04/01/2023 03/05/23   Sherlyn Hay, DO  FreeStyle Unistick II Lancets MISC Use as instructed to check blood glucose daily 06/26/19   Erasmo Downer, MD  glucose blood (FREESTYLE TEST STRIPS) test strip Use as instructed to check blood glucose daily 04/16/22   Erasmo Downer, MD  Vitamin D, Ergocalciferol, (DRISDOL) 1.25 MG (50000 UNIT) CAPS capsule Take 1 capsule (50,000 Units total) by mouth every 7 (seven) days. Patient not taking: Reported on 04/01/2023 05/08/22   Erasmo Downer, MD    Current Facility-Administered Medications  Medication Dose Route Frequency Provider Last Rate Last Admin   acetaminophen  (TYLENOL) tablet 650 mg  650 mg Oral Q6H PRN Dahal, Melina Schools, MD       Or   acetaminophen (TYLENOL) suppository 650 mg  650 mg Rectal Q6H PRN Dahal, Binaya, MD       albuterol (PROVENTIL) (2.5 MG/3ML) 0.083% nebulizer solution 2.5 mg  2.5 mg Nebulization Q6H PRN Dahal, Binaya, MD       bisacodyl (DULCOLAX) EC tablet 5 mg  5 mg Oral Daily PRN Dahal, Melina Schools, MD       hydrALAZINE (APRESOLINE) injection 10 mg  10 mg Intravenous Q6H PRN Dahal, Binaya, MD       HYDROmorphone (DILAUDID) injection 0.5 mg  0.5 mg Intravenous Q4H PRN Dahal, Binaya, MD       insulin aspart (novoLOG) injection 0-5 Units  0-5 Units Subcutaneous QHS Dahal, Binaya, MD       insulin aspart (novoLOG) injection 0-9 Units  0-9 Units Subcutaneous Q4H Dahal, Binaya, MD       oxyCODONE (Oxy IR/ROXICODONE) immediate release tablet 5 mg  5 mg Oral Q4H PRN Dahal, Binaya, MD       polyethylene glycol (MIRALAX / GLYCOLAX) packet 17 g  17 g Oral Daily PRN Dahal, Binaya, MD       sodium bicarbonate 75 mEq in sodium chloride 0.45 % 1,075 mL infusion   Intravenous Continuous Dahal, Melina Schools, MD        Allergies as of 04/01/2023 - Review Complete 04/01/2023  Allergen Reaction Noted   Sulfa antibiotics Hives 01/21/2015    Family History  Problem Relation Age of Onset   COPD Mother        heavy smoker   Pulmonary fibrosis Mother    Fibromyalgia Mother    Cancer Father    COPD Father    Melanoma Father    COPD Sister    Pulmonary fibrosis Sister    Pulmonary fibrosis Sister    COPD Sister    Pulmonary fibrosis Sister    COPD Sister    COPD Brother    Healthy Brother    Heart failure Maternal Grandmother    Heart attack Maternal Grandfather    Cancer Paternal Grandmother        unsure of type, had mets in colon   Heart attack Paternal Grandfather    Cancer Paternal Grandfather        unknown   Breast cancer Neg Hx    Cervical cancer Neg Hx     Social History   Socioeconomic History   Marital status: Married    Spouse  name: Fredrik Cove   Number of children: 0   Years of education: some college   Highest education level: 12th grade  Occupational History   Occupation: CARE MANGEMENT  Employer: [employer]  Tobacco Use   Smoking status: Never   Smokeless tobacco: Never  Vaping Use   Vaping status: Never Used  Substance and Sexual Activity   Alcohol use: No    Alcohol/week: 0.0 standard drinks of alcohol   Drug use: No   Sexual activity: Not Currently    Birth control/protection: Surgical  Other Topics Concern   Not on file  Social History Narrative   Lives with Geographical information systems officer.  No pets.    Social Drivers of Corporate investment banker Strain: Low Risk  (08/17/2022)   Overall Financial Resource Strain (CARDIA)    Difficulty of Paying Living Expenses: Not hard at all  Food Insecurity: No Food Insecurity (08/17/2022)   Hunger Vital Sign    Worried About Running Out of Food in the Last Year: Never true    Ran Out of Food in the Last Year: Never true  Transportation Needs: No Transportation Needs (08/17/2022)   PRAPARE - Administrator, Civil Service (Medical): No    Lack of Transportation (Non-Medical): No  Physical Activity: Insufficiently Active (08/17/2022)   Exercise Vital Sign    Days of Exercise per Week: 3 days    Minutes of Exercise per Session: 30 min  Stress: No Stress Concern Present (08/17/2022)   [employer] of Occupational Health - Occupational Stress Questionnaire    Feeling of Stress : Only a little  Social Connections: Unknown (08/17/2022)   Social Connection and Isolation Panel [NHANES]    Frequency of Communication with Friends and Family: More than three times a week    Frequency of Social Gatherings with Friends and Family: Once a week    Attends Religious Services: More than 4 times per year    Active Member of [employer] or Organizations: Patient declined    Attends Engineer, structural: Not on file    Marital Status: Married  Catering manager  Violence: Not on file    Review of Systems: Gen: Denies fever, sweats or chills. No weight loss.  CV: Denies chest pain, palpitations or edema. Resp: Denies cough, shortness of breath of hemoptysis.  GI: See HPI. GU :See HPI. MS: Denies joint pain, muscles aches or weakness. Derm: + Rash on legs, generalized itchiness. Psych: Denies depression, anxiety, memory loss or confusion. Heme: Denies easy bruising, bleeding. Neuro:  Denies headaches, dizziness or paresthesias. Endo:  + DM type II.  Physical Exam: Vital signs in last 24 hours: Temp:  [98 F (36.7 C)-98.6 F (37 C)] 98.3 F (36.8 C) (12/19 1049) Pulse Rate:  [71-92] 71 (12/19 1049) Resp:  [16-18] 17 (12/19 1049) BP: (97-136)/(50-77) 97/50 (12/19 1049) SpO2:  [93 %-100 %] 100 % (12/19 1049) Weight:  [94.3 kg] 94.3 kg (12/18 1300)   General:  Alert 67 year old female in no acute distress. Head:  Normocephalic and atraumatic. Eyes:  No scleral icterus. Conjunctiva pink. Ears:  Normal auditory acuity. Nose:  No deformity, discharge or lesions. Mouth:  Dentition intact. No ulcers or lesions.  Neck:  Supple. No lymphadenopathy or thyromegaly.  Lungs: Breath sounds clear throughout. No wheezes, rhonchi or crackles.  Heart: Regular rate and rhythm, no murmurs. Abdomen: Soft, nondistended.  Moderate RUQ tenderness which radiates to the flank area without rebound or guarding.  Positive bowel sounds to all 4 quadrants.  No hepatosplenomegaly.  No palpable ascites.  No bruit. Rectal: Deferred. Musculoskeletal:  Symmetrical without gross deformities.  Pulses:  Normal pulses noted. Extremities:  Without clubbing or edema.  Neurologic:  Alert and  oriented x 4. No focal deficits.  Skin:Moderate jaundice present. Psych:  Alert and cooperative. Normal mood and affect.    Lab Results: Recent Labs    03/31/23 1257  WBC 3.9*  HGB 11.0*  HCT 34.2*  PLT 95*   BMET Recent Labs    03/31/23 1257  NA 134*  K 4.3  CL 106   CO2 15*  GLUCOSE 218*  BUN 30*  CREATININE 1.42*  CALCIUM 8.9   LFT Recent Labs    03/31/23 1257  PROT 7.3  ALBUMIN 2.9*  AST 105*  ALT 49*  ALKPHOS 232*  BILITOT 16.6*   PT/INR Recent Labs    03/31/23 1924  LABPROT 16.2*  INR 1.3*   Lab Results  Component Value Date   LIPASE 398 (H) 03/31/2023       Studies/Results: MR ABDOMEN MRCP W WO CONTAST Result Date: 03/31/2023 CLINICAL DATA:  Intrahepatic/extrahepatic dilatation, concern for choledocholithiasis EXAM: MRI ABDOMEN WITHOUT AND WITH CONTRAST (INCLUDING MRCP) TECHNIQUE: Multiplanar multisequence MR imaging of the abdomen was performed both before and after the administration of intravenous contrast. Heavily T2-weighted images of the biliary and pancreatic ducts were obtained, and three-dimensional MRCP images were rendered by post processing. CONTRAST:  10mL GADAVIST GADOBUTROL 1 MMOL/ML IV SOLN COMPARISON:  CT abdomen/pelvis dated 03/31/2023. FINDINGS: Lower chest: Lung bases are essentially clear. Hepatobiliary: Cirrhosis. No hepatic steatosis. No suspicious/enhancing hepatic lesions. Distended gallbladder, without cholelithiasis or pericholecystic inflammatory changes. Mild layering gallbladder sludge (series 21/image 87). Mild central intrahepatic ductal dilatation. Dilated common duct, measuring 14 mm. Suspected 3 mm distal CBD stone (series 4/image 25). Pancreas:  Within normal limits. Spleen:  Mild splenomegaly. Adrenals/Urinary Tract:  Adrenal glands are within normal limits. Kidneys are within normal limits.  No hydronephrosis. Stomach/Bowel: Stomach is within normal limits. Visualized bowel is grossly unremarkable. Vascular/Lymphatic: No evidence of abdominal aortic aneurysm. Portal vein is patent. Recanalized periumbilical vein. Mild perigastric varices. No suspicious abdominal lymphadenopathy. Other:  Trace upper abdominal ascites. Musculoskeletal: No focal osseous lesions. IMPRESSION: Dilated common duct,  measuring 14 mm, with suspected 3 mm distal CBD stone. Consider ERCP. Mild layering gallbladder sludge. No associated inflammatory changes to suggest acute cholecystitis. Cirrhosis with sequela of portal hypertension, as above. No evidence of HCC. Electronically Signed   By: Charline Bills M.D.   On: 03/31/2023 23:21   CT ABDOMEN PELVIS W CONTRAST Result Date: 03/31/2023 CLINICAL DATA:  Nausea/vomiting, jaundice, fatigue EXAM: CT ABDOMEN AND PELVIS WITH CONTRAST TECHNIQUE: Multidetector CT imaging of the abdomen and pelvis was performed using the standard protocol following bolus administration of intravenous contrast. RADIATION DOSE REDUCTION: This exam was performed according to the departmental dose-optimization program which includes automated exposure control, adjustment of the mA and/or kV according to patient size and/or use of iterative reconstruction technique. CONTRAST:  OMNIPAQUE IOHEXOL 300 MG/ML  SOLN COMPARISON:  Right upper quadrant ultrasound dated 03/31/2023 FINDINGS: Lower chest: 9 mm calcified granuloma at the left lung base, benign. Hepatobiliary: Cirrhosis. Calcified hepatic granulomata. No suspicious hepatic lesions. Gallbladder is unremarkable. Mild central intrahepatic and extrahepatic ductal dilatation. Common duct measuring up to 15 mm (coronal image 2). Pancreas: Within normal limits. Spleen: Enlarged, measuring 15.9 cm in maximal craniocaudal dimension. Calcified splenic granulomata. Adrenals/Urinary Tract: Adrenal glands are within normal limits. Left kidney is within normal limits. Punctate nonobstructing right upper pole renal calculus (series 2/image 28). No hydronephrosis. Bladder is within normal limits. Stomach/Bowel: Stomach is within normal limits. No evidence of bowel obstruction. Normal  appendix (series 2/image 6). Left colonic diverticulosis, without evidence of diverticulitis. Vascular/Lymphatic: No evidence of abdominal aortic aneurysm. Atherosclerotic  calcifications of the abdominal aorta and branch vessels, although vessels remain patent. Portal vein is patent. Recanalized periumbilical vein. Mild perigastric varices. No suspicious abdominopelvic lymphadenopathy. Reproductive: Status post hysterectomy. No adnexal masses. Other: Trace upper abdominal and pelvic ascites. Musculoskeletal: Mild degenerative changes of the visualized thoracolumbar spine. IMPRESSION: Cirrhosis.  No findings suspicious for HCC Mild central intrahepatic and extrahepatic ductal dilatation. Common duct measuring up to 15 mm. In the setting of abnormal LFTs, consider ERCP or MRCP for further evaluation, as clinically warranted. Additional ancillary findings as above. Electronically Signed   By: Charline Bills M.D.   On: 03/31/2023 21:08   US Abdomen Limited RUQ (LIVER/GB) Result Date: 03/31/2023 CLINICAL DATA:  Pain and nausea and vomiting for 2 weeks.  Jaundice EXAM: ULTRASOUND ABDOMEN LIMITED RIGHT UPPER QUADRANT COMPARISON:  Ultrasound 06/10/2021. FINDINGS: Gallbladder: Dilated gallbladder with sludge. No wall thickening or adjacent fluid. No shadowing stones. No reported sonographic Murphy's sign. Common bile duct: Diameter: 7 mm Liver: Heterogeneous liver with some nodular contours. Please correlate for any history of liver disease. There is some intrahepatic biliary ductal dilatation. Portal vein is patent on color Doppler imaging with normal direction of blood flow towards the liver. Other: Possible varices.  Trace ascites IMPRESSION: Nodular heterogeneous liver. Question some varices. Trace ascites suggested. Distended gallbladder with sludge. Mild biliary duct ectasia. Recommend further workup with CT to further delineate these findings when appropriate Electronically Signed   By: Karen Kays M.D.   On: 03/31/2023 15:26    IMPRESSION/PLAN:  67 year old female admitted to the hospital with N/V, RUQ pain and new onset obstructive jaundice with elevated LFTs and lipase  level. Total bili 16.6 -> 16.4. Alk phos 232 -> 199.  AST 105 -> 97.  ALT 49 -> 47. RUQ sonogram showed a nodular heterogeneous liver with questionable varices, trace ascites, a distended gallbladder with sludge and mild intrahepatic biliary ductal dilatation the patent portal vein.  CTAP with contrast showed evidence of cirrhosis with mild central intrahepatic/extrahepatic ductal dilatation, CBD measuring up to 15 mm splenomegaly mild recannulized periumbilical vein, mild perigastric varices  and trace ascites.  Abdominal MRI/MRCP showed a dilated CBD measuring 14 mm with a suspected 3 mm distal CBD stone, mild layering gallbladder sludge without evidence of acute cholecystitis with evidence of cirrhosis with sequela of portal hypertension without evidence of HCC.  [facility] for ERCP. afebrile.  Hemodynamically stable. -ERCP benefits and risks discussed including risk with sedation, risk of bleeding, perforation and infection. Timing to be determined. Await further recommendations per Dr. Leone Payor -NPO after midnight  -IV fluids per the hospitalist -Ondansetron 4 mg p.o. 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  Liver biopsy 03/2022 showed evidence of steatohepatitis.  -EGD at time of ERCP to assess for varices -Patient will need outpatient GI follow-up, she is unsure if she wishes to reestablish GI care at [facility] which is closer to home versus establishing care with [facility]  Thrombocytopenia secondary to cirrhosis and splenomegaly  Normocytic anemia. No overt GI bleeding.  -CBC, iron, ferritin, folate and B12 in am  Coagulopathy secondary to cirrhosis.  INR 1.3 -> 1.4.   DM type II, last dose of Rybelsus  was 2 weeks ago  GERD -Pantoprazole 40 mg p.o.  daily    Arnaldo Natal  04/01/2023, 1:29 PM    [facility] Attending   I have taken an interval history, reviewed the chart and examined the patient. I agree with the Advanced Practitioner's note, impression and recommendations with the following additions:   The patient has obstructive jaundice.  There is a suspected small (3 mm distal CBD stone near the ampulla.  I agree with the radiologist evaluation after reviewing the MRCP images.  It is a bit unusual to have pruritus in the setting of obstructive jaundice from choledocholithiasis.  There are no signs of strictures or masses to suggest tumor which that is more common with.  She has advanced liver disease, previous biopsy did not show cirrhosis but her overall clinical scenario is consistent with that.  There is either a rash or spider angiomata on the upper trunk.  She relates having a rash after starting and stopping Rybelsus a couple of weeks ago.  She is at higher risk of problems with her liver disease, platelets are low and INR is slightly elevated.  No products such as coagulation factors or platelets are needed however with the current status of these parameters.  Lipase was elevated though we do not see pancreatitis on imaging.  Follow-up lipase in the morning as well.  Sometimes that is elevated in the setting of obstructive jaundice from choledocholithiasis.  Do plan to start with an EGD to survey the esophagus for varices and look for other signs of portal hypertension.  Then would proceed to ERCP.  I have fully reviewed the risks benefits and indications of the procedure and she has had ample opportunity to ask questions and all were answered.  I would recommend outpatient hepatology follow-up if possible as opposed to general GI follow-up though she would need that as well.  She does not have gallstones based upon the imaging.  Typically a cholecystectomy would be recommended though she is at higher risk of  complications with cholecystectomy.  Consider surgical consult for their opinion pending ERCP.  Iva Boop, MD, [facility] See Loretha Stapler on call - gastroenterology for best contact person 04/01/2023 3:42 PM

## 2023-04-01 NOTE — Progress Notes (Signed)
Plan of Care Note for accepted transfer   Patient: Cindy Trujillo MRN: [id number]   DOA: 03/31/2023  Facility requesting transfer: [facility] Requesting Provider: Dr. York Cerise Reason for transfer: Choledocholithiasis Facility course: 67 yo F with jaundice, fatigue, abd pain.  Ongoing for past month, progressively worsening.  enies history of liver disease. Patient denies excessive alcohol or tylenol use.      Latest Ref Rng & Units 03/31/2023   12:57 PM 01/13/2023    8:57 AM 10/09/2022    9:29 AM  CMP  Glucose 70 - 99 mg/dL 308  657  846   BUN 8 - 23 mg/dL 30  23  22    Creatinine 0.44 - 1.00 mg/dL 9.62  9.52  8.41   Sodium 135 - 145 mmol/L 134  141  138   Potassium 3.5 - 5.1 mmol/L 4.3  4.6  4.7   Chloride 98 - 111 mmol/L 106  106  104   CO2 22 - 32 mmol/L 15  18  20    Calcium 8.9 - 10.3 mg/dL 8.9  9.4  9.8   Total Protein 6.5 - 8.1 g/dL 7.3  6.9  7.6   Total Bilirubin <1.2 mg/dL 32.4  1.4  0.8   Alkaline Phos 38 - 126 U/L 232  121  131   AST 15 - 41 U/L 105  52  43   ALT 0 - 44 U/L 49  24  24    CT AP = CBD dilated and pt has cirrhosis.  MRCP = 3mm distal CBD stone and duct dilation of 14mm.  EDP spoke with Dr. Lavon Paganini.  Will transfer pt to [place] for ERCP.  Plan of care: The patient is accepted for admission to Med-surg  unit, at [facility] or [facility] (first available).    Author: Hillary Bow., DO 04/01/2023  Check [email or web address] for on-call coverage.  Nursing staff, Please call [facility] Admits & Consults System-Wide number on Amion as soon as patient's arrival, so appropriate admitting provider can evaluate the pt.

## 2023-04-01 NOTE — ED Notes (Signed)
Waiting on room assignment

## 2023-04-01 NOTE — H&P (Signed)
[facility] History and Physical  AJENE PIPHER ZWC:[id number] DOB: 08/06/1955 DOA: 04/01/2023 PCP: Erasmo Downer, MD  Presented from: Home Chief Complaint: Nausea, vomiting, yellow skin  History of Present Illness: Cindy Trujillo is a 67 y.o. female with PMH significant for DM2, HLD, right leg DVT, anemia, Bell's palsy 12/18, patient presented to ED at [facility] with concern of progressively worsening abdomen pain, fatigue, yellowish discoloration of the stool and lately noticed confusion.  She has noticed clay colored stool, worsening generalized itchiness.  Denies prior history of liver disease.  Denies any excessive alcohol use or regular use of Tylenol.  In the ED, patient was afebrile, hemodynamically stable Initial labs with WC count 3.9, hemoglobin 11, platelet 95, sodium 134, serum bicarb low at 15, BUN/creatinine 30/1.42, AST/ALT/alk phos/total bili elevated to 105/49/232/16.6, lipase elevated to 398 Glucose 218, INR 1.3 Ultrasound abdomen showed nodular heterogeneous liver, trace ascites, questionable varices, distended gallbladder with sludge, mild biliary duct ectasia CT abdomen and pelvis showed liver cirrhosis, no findings suspicious for HCC, mild central intrahepatic and extrahepatic biliary  dilatation, CBD dilated to 15 mm MRCP showed CBD dilated to 14 mm, suspected 3 mm distal CBD stone, mild layering gallbladder sludge without inflammatory changes, cirrhosis with sequelae of portal hypertension.  EDP discussed with GI Dr. Servando Snare at [facility] for potential need of ERCP.  However, he is not going to be available for ERCP and hence patient needed transfer to a tertiary center. He reviewed and discussed with GI Dr. Lavon Paganini, accepted transfer for ERCP [facility] was consulted for in-hospital management at [facility] after transfer.  I evaluated the patient after she arrived to [facility] this morning Afebrile, heart rate in 70s, blood pressure 90s, breathing on room air I ordered  for stat labs GI has been informed of patient's arrival  At the time of my evaluation, patient was lying on bed.  Not in distress. Husband at bedside History reviewed and detailed as above.  Review of Systems:  All systems were reviewed and were negative unless otherwise mentioned in the HPI   Past medical history: Past Medical History:  Diagnosis Date   Anemia    Bell's palsy    Diabetes mellitus without complication (HCC)    DVT (deep venous thrombosis) (HCC) 08/2018   right leg   GERD (gastroesophageal reflux disease)    Hyperlipidemia    Post-menopausal bleeding 09/21/2019    Past surgical history: Past Surgical History:  Procedure Laterality Date   COLONOSCOPY WITH PROPOFOL N/A 08/23/2017   Procedure: COLONOSCOPY WITH PROPOFOL;  Surgeon: Scot Jun, MD;  Location: [facility] ENDOSCOPY;  Service: Endoscopy;  Laterality: N/A;   ENDOVENOUS ABLATION SAPHENOUS VEIN W/ LASER Left 02/29/2020   endovenous laser ablation left greater saphenous vein and stab phlebectomy 10-20 incisions left leg by Cari Caraway MD    HYSTEROSCOPY WITH D & C N/A 10/31/2019   Procedure: DILATATION AND CURETTAGE /HYSTEROSCOPY WITH MYOSURE RESECTION;  Surgeon: Natale Milch, MD;  Location: [facility] ORS;  Service: Gynecology;  Laterality: N/A;   ROBOTIC ASSISTED TOTAL HYSTERECTOMY WITH BILATERAL SALPINGO OOPHERECTOMY N/A 12/06/2019   Procedure: XI ROBOTIC ASSISTED TOTAL HYSTERECTOMY WITH BILATERAL SALPINGO OOPHORECTOMY;  Surgeon: Artelia Laroche, MD;  Location: [facility] ORS;  Service: Gynecology;  Laterality: N/A;   SENTINEL NODE BIOPSY N/A 12/06/2019   Procedure: SENTINEL NODE BIOPSY;  Surgeon: Artelia Laroche, MD;  Location: [facility] ORS;  Service: Gynecology;  Laterality: N/A;   TONSILLECTOMY     TONSILLECTOMY AND ADENOIDECTOMY  1968  VEIN SURGERY  05/2019   laser    Social History:  reports that she has never smoked. She has never used smokeless tobacco. She reports that she does not  drink alcohol and does not use drugs.  Allergies:  Allergies  Allergen Reactions   Sulfa Antibiotics Hives   Sulfa antibiotics   Family history:  Family History  Problem Relation Age of Onset   COPD Mother        heavy smoker   Pulmonary fibrosis Mother    Fibromyalgia Mother    Cancer Father    COPD Father    Melanoma Father    COPD Sister    Pulmonary fibrosis Sister    Pulmonary fibrosis Sister    COPD Sister    Pulmonary fibrosis Sister    COPD Sister    COPD Brother    Healthy Brother    Heart failure Maternal Grandmother    Heart attack Maternal Grandfather    Cancer Paternal Grandmother        unsure of type, had mets in colon   Heart attack Paternal Grandfather    Cancer Paternal Grandfather        unknown   Breast cancer Neg Hx    Cervical cancer Neg Hx      Physical Exam: Vitals:   04/01/23 1049  BP: (!) 97/50  Pulse: 71  Resp: 17  Temp: 98.3 F (36.8 C)  TempSrc: Oral  SpO2: 100%   Wt Readings from Last 3 Encounters:  03/31/23 94.3 kg  02/23/23 97.8 kg  11/13/22 94.5 kg   There is no height or weight on file to calculate BMI.  General exam: Pleasant, elderly Caucasian female.  Not in distress Skin: No rashes, lesions or ulcers.  Generalized Ellis discoloration of skin and eyes HEENT: Atraumatic, normocephalic, no obvious bleeding Lungs: Clear to auscultation bilaterally CVS: Regular rate and rhythm, no murmur GI/Abd soft mild epigastric tenderness, bowel sound present CNS: Alert, awake, oriented x 3 Psychiatry: Mood appropriate Extremities: No pedal edema, no calf  tenderness   ----------------------------------------------------------------------------------------------------------------------------------------- ----------------------------------------------------------------------------------------------------------------------------------------- -----------------------------------------------------------------------------------------------------------------------------------------  Assessment/Plan: Principal Problem:   Choledocholithiasis  Choledocholithiasis Elevated LFTs, bilirubin, lipase Presented with jaundice, itching, progressive fatigue Ultrasound, CT abdomen and MRCP findings as above showing choledocholithiasis Lipase level elevated but imaging without evidence of pancreatitis. GI consulted for ERCP Per GI, full liquid today and to remain n.p.o. after midnight Repeat CBC, CMP, direct bilirubin and INR this morning Continue PPI Recent Labs  Lab 03/31/23 1257 03/31/23 1924 04/01/23 1142  AST 105*  --  97*  ALT 49*  --  47*  ALKPHOS 232*  --  199*  BILITOT 16.6*  --  16.4*  BILIDIR  --   --  9.0*  PROT 7.3  --  6.6  ALBUMIN 2.9*  --  2.4*  INR  --  1.3* 1.4*  LIPASE 398*  --   --   PLT 95*  --  94*   New diagnosis of liver cirrhosis Portal hypertension and sequelae No history of alcoholism or excess use of Tylenol Hepatitis panel negative Likely idiopathic etiology of liver cirrhosis Has mild ascites, questionable varices Further workup per GI  CKD 3B Acute metabolic acidosis Baseline creatinine 1.42 from October.  Creatinine remained stable at this time. Serum bicarb was significant low at 15.  Bicarb drip may help.  Will order for next 24 hours Continue to monitor Recent Labs    04/27/22 1023 04/28/22 1347 10/09/22 0929 01/13/23 0857 03/31/23 1257 04/01/23 1142  BUN 19 22 22 23  30* 27*  CREATININE 1.16* 1.13* 1.17* 1.42* 1.42* 1.33*  CO2 19* 21* 20 18* 15* 17*   H/o right leg DVT Several years ago.   Completed 6 months course of Eliquis  Thrombocytopenia Platelet level low likely due to liver cirrhosis.  Watch out for bleeding.  Avoid heparin products Recent Labs  Lab 03/31/23 1257 04/01/23 1142  PLT 95* 94*    Type 2 diabetes mellitus A1c 7.5 on October 2024 PTA meds-Farxiga 10 mg daily, metformin 1000 mg twice daily.  Keep oral meds on hold Start SSI/Accu-Cheks Recent Labs  Lab 04/01/23 1141  GLUCAP 119*   HypOtension H/o HTN Blood pressure in 90s this morning Monitor on IV fluid PTA meds- lisinopril 5 mg daily.  Hold for now.  HLD PTA meds- aspirin 80 mg daily, Crestor 5 mg daily Hold aspirin for the needed procedure.  Avoid Crestor because of cirrhosis and elevated liver enzymes  Mild chronic iron deficiency anemia Chronically on iron supplement..  Continue monitor hemoglobin Recent Labs     0000 04/28/22 1346 04/28/22 1347 08/17/22 1531 08/17/22 1531 10/09/22 0929 03/31/23 1257 04/01/23 1142  HGB  --   --  12.2 10.2*  --  11.0* 11.0* 10.9*  MCV   < >  --  88.1 85   < > 85 92.7 89.2  VITAMINB12  --  1,299*  --   --   --   --   --   --   FERRITIN  --   --  61  --   --   --   --   --   TIBC  --   --  363  --   --   --   --   --   IRON  --   --  71  --   --   --   --   --    < > = values in this interval not displayed.   Mobility: Encourage ambulation  Goals of care:   Code Status: Full Code    DVT prophylaxis:   Place and maintain sequential compression device Start: 04/01/23 1259   Antimicrobials: None Fluid: Sodium bicarb drip for next 24 hours Consultants: GI Family Communication: Husband at bedside  Dispo: The patient is from: Home              Anticipated d/c is to: Hopefully home in 2 to 3 days  Diet: Diet Order             Diet NPO time specified  Diet effective midnight           Diet full liquid Room service appropriate? Yes; Fluid consistency: Thin  Diet effective now                     ------------------------------------------------------------------------------------- Severity of Illness: The appropriate patient status for this patient is INPATIENT. Inpatient status is judged to be reasonable and necessary in order to provide the required intensity of service to ensure the patient's safety. The patient's presenting symptoms, physical exam findings, and initial radiographic and laboratory data in the context of their chronic comorbidities is felt to place them at high risk for further clinical deterioration. Furthermore, it is not anticipated that the patient will be medically stable for discharge from the hospital within 2 midnights of admission.   * I certify that at the point of admission it is my clinical judgment that the patient will require inpatient  hospital care spanning beyond 2 midnights from the point of admission due to high intensity of service, high risk for further deterioration and high frequency of surveillance required.* -------------------------------------------------------------------------------------   Home Meds: Prior to Admission medications   Medication Sig Start Date End Date Taking? Authorizing Provider  aspirin EC 81 MG tablet Take 1 tablet (81 mg total) by mouth daily. Swallow whole. 11/15/20  Yes Rickard Patience, MD  dapagliflozin propanediol (FARXIGA) 10 MG TABS tablet Take 1 tablet (10 mg total) by mouth daily. 10/09/22  Yes Bacigalupo, Marzella Schlein, MD  ferrous sulfate 325 (65 FE) MG tablet TAKE 1 TABLET BY MOUTH TWICE DAILY Patient taking differently: Take 325 mg by mouth daily. 07/04/20 04/01/23 Yes Rickard Patience, MD  guaiFENesin-codeine 100-10 MG/5ML syrup Take 10 mLs by mouth 3 (three) times daily as needed for cough. 02/23/23  Yes Bacigalupo, Marzella Schlein, MD  guaiFENesin-dextromethorphan (ROBITUSSIN DM) 100-10 MG/5ML syrup Take 5 mLs by mouth every 4 (four) hours as needed for cough.   Yes [provider]  lisinopril (ZESTRIL) 5 MG tablet Take 1 tablet  (5 mg total) by mouth daily. 10/09/22 10/09/23 Yes Bacigalupo, Marzella Schlein, MD  Magnesium 400 MG TABS Take 1 tablet by mouth 2 (two) times daily.   Yes [provider]  metFORMIN (GLUCOPHAGE) 1000 MG tablet Take 1 tablet (1,000 mg total) by mouth 2 (two) times daily with a meal. 10/09/22 10/09/23 Yes Bacigalupo, Marzella Schlein, MD  omeprazole (PRILOSEC) 20 MG capsule Take 1 capsule (20 mg total) by mouth daily. 04/16/22  Yes Bacigalupo, Marzella Schlein, MD  rosuvastatin (CRESTOR) 5 MG tablet Take 1 tablet (5 mg total) by mouth daily. 10/09/22  Yes Bacigalupo, Marzella Schlein, MD  Semaglutide (RYBELSUS) 3 MG TABS Take 1 tablet (3 mg total) by mouth daily. Patient not taking: Reported on 02/23/2023 01/21/23   Erasmo Downer, MD  vitamin B-12 (CYANOCOBALAMIN) 1000 MCG tablet Take 1 tablet (1,000 mcg total) by mouth daily. 03/21/19  Yes Rickard Patience, MD  VITAMIN E PO Take 1 tablet by mouth daily.   Yes [provider]  fluconazole (DIFLUCAN) 100 MG tablet Take 1 tablet (100 mg total) by mouth daily. Patient not taking: Reported on 04/01/2023 03/05/23   Sherlyn Hay, DO  FreeStyle Unistick II Lancets MISC Use as instructed to check blood glucose daily 06/26/19   Erasmo Downer, MD  glucose blood (FREESTYLE TEST STRIPS) test strip Use as instructed to check blood glucose daily 04/16/22   Erasmo Downer, MD  Vitamin D, Ergocalciferol, (DRISDOL) 1.25 MG (50000 UNIT) CAPS capsule Take 1 capsule (50,000 Units total) by mouth every 7 (seven) days. Patient not taking: Reported on 04/01/2023 05/08/22   Erasmo Downer, MD    Labs on Admission:   CBC: Recent Labs  Lab 03/31/23 1257 04/01/23 1142  WBC 3.9* 3.1*  NEUTROABS 2.7  --   HGB 11.0* 10.9*  HCT 34.2* 33.1*  MCV 92.7 89.2  PLT 95* 94*    Basic Metabolic Panel: Recent Labs  Lab 03/31/23 1257 04/01/23 1142  NA 134* 137  K 4.3 4.3  CL 106 112*  CO2 15* 17*  GLUCOSE 218* 135*  BUN 30* 27*  CREATININE 1.42* 1.33*  CALCIUM 8.9 8.6*     Liver Function Tests: Recent Labs  Lab 03/31/23 1257 04/01/23 1142  AST 105* 97*  ALT 49* 47*  ALKPHOS 232* 199*  BILITOT 16.6* 16.4*  PROT 7.3 6.6  ALBUMIN 2.9* 2.4*   Recent Labs  Lab 03/31/23  1257  LIPASE 398*   No results for input(s): "AMMONIA" in the last 168 hours.  Cardiac Enzymes: No results for input(s): "CKTOTAL", "CKMB", "CKMBINDEX", "TROPONINI" in the last 168 hours.  BNP (last 3 results) No results for input(s): "BNP" in the last 8760 hours.  ProBNP (last 3 results) No results for input(s): "PROBNP" in the last 8760 hours.  CBG: Recent Labs  Lab 04/01/23 1141  GLUCAP 119*    Lipase     Component Value Date/Time   LIPASE 398 (H) 03/31/2023 1257     Urinalysis    Component Value Date/Time   COLORURINE AMBER (A) 03/31/2023 1259   APPEARANCEUR CLEAR (A) 03/31/2023 1259   LABSPEC 1.025 03/31/2023 1259   PHURINE 5.0 03/31/2023 1259   GLUCOSEU >=500 (A) 03/31/2023 1259   HGBUR NEGATIVE 03/31/2023 1259   BILIRUBINUR SMALL (A) 03/31/2023 1259   BILIRUBINUR neg 03/30/2018 1440   KETONESUR NEGATIVE 03/31/2023 1259   PROTEINUR NEGATIVE 03/31/2023 1259   UROBILINOGEN 0.2 03/30/2018 1440   NITRITE NEGATIVE 03/31/2023 1259   LEUKOCYTESUR NEGATIVE 03/31/2023 1259     Drugs of Abuse  No results found for: "LABOPIA", "COCAINSCRNUR", "LABBENZ", "AMPHETMU", "THCU", "LABBARB"    Radiological Exams on Admission: MR ABDOMEN MRCP W WO CONTAST Result Date: 03/31/2023 CLINICAL DATA:  Intrahepatic/extrahepatic dilatation, concern for choledocholithiasis EXAM: MRI ABDOMEN WITHOUT AND WITH CONTRAST (INCLUDING MRCP) TECHNIQUE: Multiplanar multisequence MR imaging of the abdomen was performed both before and after the administration of intravenous contrast. Heavily T2-weighted images of the biliary and pancreatic ducts were obtained, and three-dimensional MRCP images were rendered by post processing. CONTRAST:  10mL GADAVIST GADOBUTROL 1 MMOL/ML IV SOLN  COMPARISON:  CT abdomen/pelvis dated 03/31/2023. FINDINGS: Lower chest: Lung bases are essentially clear. Hepatobiliary: Cirrhosis. No hepatic steatosis. No suspicious/enhancing hepatic lesions. Distended gallbladder, without cholelithiasis or pericholecystic inflammatory changes. Mild layering gallbladder sludge (series 21/image 87). Mild central intrahepatic ductal dilatation. Dilated common duct, measuring 14 mm. Suspected 3 mm distal CBD stone (series 4/image 25). Pancreas:  Within normal limits. Spleen:  Mild splenomegaly. Adrenals/Urinary Tract:  Adrenal glands are within normal limits. Kidneys are within normal limits.  No hydronephrosis. Stomach/Bowel: Stomach is within normal limits. Visualized bowel is grossly unremarkable. Vascular/Lymphatic: No evidence of abdominal aortic aneurysm. Portal vein is patent. Recanalized periumbilical vein. Mild perigastric varices. No suspicious abdominal lymphadenopathy. Other:  Trace upper abdominal ascites. Musculoskeletal: No focal osseous lesions. IMPRESSION: Dilated common duct, measuring 14 mm, with suspected 3 mm distal CBD stone. Consider ERCP. Mild layering gallbladder sludge. No associated inflammatory changes to suggest acute cholecystitis. Cirrhosis with sequela of portal hypertension, as above. No evidence of HCC. Electronically Signed   By: Charline Bills M.D.   On: 03/31/2023 23:21   CT ABDOMEN PELVIS W CONTRAST Result Date: 03/31/2023 CLINICAL DATA:  Nausea/vomiting, jaundice, fatigue EXAM: CT ABDOMEN AND PELVIS WITH CONTRAST TECHNIQUE: Multidetector CT imaging of the abdomen and pelvis was performed using the standard protocol following bolus administration of intravenous contrast. RADIATION DOSE REDUCTION: This exam was performed according to the departmental dose-optimization program which includes automated exposure control, adjustment of the mA and/or kV according to patient size and/or use of iterative reconstruction technique. CONTRAST:   OMNIPAQUE IOHEXOL 300 MG/ML  SOLN COMPARISON:  Right upper quadrant ultrasound dated 03/31/2023 FINDINGS: Lower chest: 9 mm calcified granuloma at the left lung base, benign. Hepatobiliary: Cirrhosis. Calcified hepatic granulomata. No suspicious hepatic lesions. Gallbladder is unremarkable. Mild central intrahepatic and extrahepatic ductal dilatation. Common duct measuring up to 15 mm (  coronal image 2). Pancreas: Within normal limits. Spleen: Enlarged, measuring 15.9 cm in maximal craniocaudal dimension. Calcified splenic granulomata. Adrenals/Urinary Tract: Adrenal glands are within normal limits. Left kidney is within normal limits. Punctate nonobstructing right upper pole renal calculus (series 2/image 28). No hydronephrosis. Bladder is within normal limits. Stomach/Bowel: Stomach is within normal limits. No evidence of bowel obstruction. Normal appendix (series 2/image 6). Left colonic diverticulosis, without evidence of diverticulitis. Vascular/Lymphatic: No evidence of abdominal aortic aneurysm. Atherosclerotic calcifications of the abdominal aorta and branch vessels, although vessels remain patent. Portal vein is patent. Recanalized periumbilical vein. Mild perigastric varices. No suspicious abdominopelvic lymphadenopathy. Reproductive: Status post hysterectomy. No adnexal masses. Other: Trace upper abdominal and pelvic ascites. Musculoskeletal: Mild degenerative changes of the visualized thoracolumbar spine. IMPRESSION: Cirrhosis.  No findings suspicious for HCC Mild central intrahepatic and extrahepatic ductal dilatation. Common duct measuring up to 15 mm. In the setting of abnormal LFTs, consider ERCP or MRCP for further evaluation, as clinically warranted. Additional ancillary findings as above. Electronically Signed   By: Charline Bills M.D.   On: 03/31/2023 21:08   US Abdomen Limited RUQ (LIVER/GB) Result Date: 03/31/2023 CLINICAL DATA:  Pain and nausea and vomiting for 2 weeks.  Jaundice EXAM:  ULTRASOUND ABDOMEN LIMITED RIGHT UPPER QUADRANT COMPARISON:  Ultrasound 06/10/2021. FINDINGS: Gallbladder: Dilated gallbladder with sludge. No wall thickening or adjacent fluid. No shadowing stones. No reported sonographic Murphy's sign. Common bile duct: Diameter: 7 mm Liver: Heterogeneous liver with some nodular contours. Please correlate for any history of liver disease. There is some intrahepatic biliary ductal dilatation. Portal vein is patent on color Doppler imaging with normal direction of blood flow towards the liver. Other: Possible varices.  Trace ascites IMPRESSION: Nodular heterogeneous liver. Question some varices. Trace ascites suggested. Distended gallbladder with sludge. Mild biliary duct ectasia. Recommend further workup with CT to further delineate these findings when appropriate Electronically Signed   By: Karen Kays M.D.   On: 03/31/2023 15:26     Signed, Lorin Glass, MD [facility] 04/01/2023

## 2023-04-01 NOTE — Plan of Care (Signed)

## 2023-04-01 NOTE — H&P (View-Only) (Signed)
 Referring Provider: Dr. Lorin Glass Primary Care Physician:  Erasmo Downer, MD Primary Gastroenterologist: Previously Dr. Manfred Shirts at [facility], Dr. Mia Creek at [facility], unassigned   Reason for Consultation: Choledocholithiasis   HPI: Cindy Trujillo is a 67 y.o. 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   She developed nausea and vomiting x 2 weeks with new onset jaundice x 1 week for she presented to [facility] ED for further evaluation.  Labs in the ED showed a WBC count of 3.9.  Hemoglobin 11.0 which is baseline per labs 10/09/2022.  Hematocrit 34.2.  Platelets 95.  Sodium 134.  Potassium 4.3.  BUN 30.  Creatinine 1.42.  Total bili 16.6 up from 1.4 two ago.  Alk phos 232.  AST 105.  ALT 49.  INR 1.3.  Lipase 398.  Urinalysis with small amount of bilirubin.  RUQ sonogram showed a nodular heterogeneous liver with questionable varices, trace ascites, a distended gallbladder with sludge and mild intrahepatic biliary ductal dilatation the patent portal vein.  CTAP with contrast showed evidence of cirrhosis with mild central intrahepatic/extrahepatic ductal dilatation, CBD measuring up to 15 mm splenomegaly mild recannulized periumbilical vein, mild perigastric varices  and trace ascites.  An abdominal MRI/MRCP showed a dilated CBD measuring 14 mm with a suspected 3 mm distal CBD stone, mild layering gallbladder sludge without evidence of acute cholecystitis with evidence of cirrhosis with sequela of portal hypertension without evidence of HCC.  Patient was transferred to [facility] for ERCP.   Labs today: WBC 3.1.  Hemoglobin 10.9.  Platelet 94.  BUN 27.  Creatinine 1.33.  Total bili 16.4.  Direct bili 9.0.  Alk phos 199.  AST 97.  ALT 47.  INR 1.4.  She developed fatigue 2 months ago which she attributed to  having COVID in October and a sinus infection in November.  She has had intermittent nausea and vomiting with nonbloody emesis for the past few months which possibly started after she took Ozempic for 6 weeks then was switched to Rybelsus the end of November.  Last dose of Rybelsus was 2 weeks ago.  She developed RUQ pain 2 weeks ago which has progressively worsened and she noticed her urine was goldenrod in color and her stools were yellow/brown.  No melena or bloody stools.  She developed generalized itchiness 1-1/2 weeks ago.  She has a history of GERD for which she takes Omeprazole 20mg  daily with intermittent heartburn and recent stomach upset.  No dysphagia.  She endorsed undergoing an EGD 08/2017, however, I could not locate these records in care everywhere or epic.  Colonoscopy 08/23/2017 showed diverticulosis, internal hemorrhoids without polyps.  No alcohol use.  Non-smoker.  She takes ASA 81 mg daily, no other NSAID use. She has a history of hepatic steatosis with possible cirrhosis, previously evaluated by GI Dr. Eather Colas at [facility] 02/2022.  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  Father with history of alcohol use disorder and liver cancer, he also had prostate cancer with metastasis.  No history of alcohol use disorder.   GI PROCEDURES:  Colonoscopy 08/23/2017: - Diverticulosis in the sigmoid colon and in the descending colon.  - Internal hemorrhoids.  - The examination was otherwise normal.  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  Past Medical History:  Diagnosis Date   Anemia    Bell's palsy    Diabetes mellitus without complication (HCC)    DVT (deep  venous thrombosis) (HCC) 08/2018   right leg   GERD (gastroesophageal reflux disease)    Hyperlipidemia    Post-menopausal bleeding 09/21/2019    Past Surgical History:  Procedure Laterality Date   COLONOSCOPY WITH PROPOFOL N/A 08/23/2017   Procedure: COLONOSCOPY WITH PROPOFOL;  Surgeon: Scot Jun, MD;  Location: [facility] ENDOSCOPY;  Service: Endoscopy;  Laterality: N/A;   ENDOVENOUS ABLATION SAPHENOUS VEIN W/ LASER Left 02/29/2020   endovenous laser ablation left greater saphenous vein and stab phlebectomy 10-20 incisions left leg by Cari Caraway MD    HYSTEROSCOPY WITH D & C N/A 10/31/2019   Procedure: DILATATION AND CURETTAGE /HYSTEROSCOPY WITH MYOSURE RESECTION;  Surgeon: Natale Milch, MD;  Location: [facility] ORS;  Service: Gynecology;  Laterality: N/A;   ROBOTIC ASSISTED TOTAL HYSTERECTOMY WITH BILATERAL SALPINGO OOPHERECTOMY N/A 12/06/2019   Procedure: XI ROBOTIC ASSISTED TOTAL HYSTERECTOMY WITH BILATERAL SALPINGO OOPHORECTOMY;  Surgeon: Artelia Laroche, MD;  Location: [facility] ORS;  Service: Gynecology;  Laterality: N/A;   SENTINEL NODE BIOPSY N/A 12/06/2019   Procedure: SENTINEL NODE BIOPSY;  Surgeon: Artelia Laroche, MD;  Location: [facility] ORS;  Service: Gynecology;  Laterality: N/A;   TONSILLECTOMY     TONSILLECTOMY AND ADENOIDECTOMY  1968   VEIN SURGERY  05/2019   laser    Prior to Admission medications   Medication Sig Start Date End Date Taking? Authorizing Provider  aspirin EC 81 MG tablet Take 1 tablet (81 mg total) by mouth daily. Swallow whole. 11/15/20  Yes Rickard Patience, MD  dapagliflozin propanediol (FARXIGA) 10 MG TABS tablet Take 1 tablet (10 mg total) by mouth daily. 10/09/22  Yes Bacigalupo, Marzella Schlein, MD  ferrous sulfate 325 (65 FE) MG tablet TAKE 1 TABLET BY MOUTH TWICE DAILY Patient taking differently: Take 325 mg by mouth daily. 07/04/20 04/01/23 Yes Rickard Patience, MD  guaiFENesin-codeine 100-10 MG/5ML syrup Take 10 mLs by mouth 3 (three) times daily as  needed for cough. 02/23/23  Yes Bacigalupo, Marzella Schlein, MD  guaiFENesin-dextromethorphan (ROBITUSSIN DM) 100-10 MG/5ML syrup Take 5 mLs by mouth every 4 (four) hours as needed for cough.   Yes [provider]  lisinopril (ZESTRIL) 5 MG tablet Take 1 tablet (5 mg total) by mouth daily. 10/09/22 10/09/23 Yes Bacigalupo, Marzella Schlein, MD  Magnesium 400 MG TABS Take 1 tablet by mouth 2 (two) times daily.   Yes [provider]  metFORMIN (GLUCOPHAGE) 1000 MG tablet Take 1 tablet (1,000 mg total) by mouth 2 (two) times daily with a meal. 10/09/22 10/09/23 Yes Bacigalupo, Marzella Schlein, MD  omeprazole (PRILOSEC) 20 MG capsule Take 1 capsule (20 mg total) by mouth daily. 04/16/22  Yes Bacigalupo, Marzella Schlein, MD  rosuvastatin (CRESTOR) 5 MG tablet Take 1 tablet (5 mg total) by mouth daily. 10/09/22  Yes Bacigalupo, Marzella Schlein, MD  Semaglutide (RYBELSUS) 3 MG TABS Take 1 tablet (3 mg total) by mouth daily. Patient not taking: Reported on 02/23/2023 01/21/23   Erasmo Downer, MD  vitamin B-12 (CYANOCOBALAMIN) 1000 MCG tablet Take 1 tablet (1,000 mcg total) by mouth daily. 03/21/19  Yes Rickard Patience, MD  VITAMIN E PO Take 1 tablet by mouth daily.  Yes [provider]  fluconazole (DIFLUCAN) 100 MG tablet Take 1 tablet (100 mg total) by mouth daily. Patient not taking: Reported on 04/01/2023 03/05/23   Sherlyn Hay, DO  FreeStyle Unistick II Lancets MISC Use as instructed to check blood glucose daily 06/26/19   Erasmo Downer, MD  glucose blood (FREESTYLE TEST STRIPS) test strip Use as instructed to check blood glucose daily 04/16/22   Erasmo Downer, MD  Vitamin D, Ergocalciferol, (DRISDOL) 1.25 MG (50000 UNIT) CAPS capsule Take 1 capsule (50,000 Units total) by mouth every 7 (seven) days. Patient not taking: Reported on 04/01/2023 05/08/22   Erasmo Downer, MD    Current Facility-Administered Medications  Medication Dose Route Frequency Provider Last Rate Last Admin   acetaminophen  (TYLENOL) tablet 650 mg  650 mg Oral Q6H PRN Dahal, Melina Schools, MD       Or   acetaminophen (TYLENOL) suppository 650 mg  650 mg Rectal Q6H PRN Dahal, Binaya, MD       albuterol (PROVENTIL) (2.5 MG/3ML) 0.083% nebulizer solution 2.5 mg  2.5 mg Nebulization Q6H PRN Dahal, Binaya, MD       bisacodyl (DULCOLAX) EC tablet 5 mg  5 mg Oral Daily PRN Dahal, Melina Schools, MD       hydrALAZINE (APRESOLINE) injection 10 mg  10 mg Intravenous Q6H PRN Dahal, Binaya, MD       HYDROmorphone (DILAUDID) injection 0.5 mg  0.5 mg Intravenous Q4H PRN Dahal, Binaya, MD       insulin aspart (novoLOG) injection 0-5 Units  0-5 Units Subcutaneous QHS Dahal, Binaya, MD       insulin aspart (novoLOG) injection 0-9 Units  0-9 Units Subcutaneous Q4H Dahal, Binaya, MD       oxyCODONE (Oxy IR/ROXICODONE) immediate release tablet 5 mg  5 mg Oral Q4H PRN Dahal, Binaya, MD       polyethylene glycol (MIRALAX / GLYCOLAX) packet 17 g  17 g Oral Daily PRN Dahal, Binaya, MD       sodium bicarbonate 75 mEq in sodium chloride 0.45 % 1,075 mL infusion   Intravenous Continuous Dahal, Melina Schools, MD        Allergies as of 04/01/2023 - Review Complete 04/01/2023  Allergen Reaction Noted   Sulfa antibiotics Hives 01/21/2015    Family History  Problem Relation Age of Onset   COPD Mother        heavy smoker   Pulmonary fibrosis Mother    Fibromyalgia Mother    Cancer Father    COPD Father    Melanoma Father    COPD Sister    Pulmonary fibrosis Sister    Pulmonary fibrosis Sister    COPD Sister    Pulmonary fibrosis Sister    COPD Sister    COPD Brother    Healthy Brother    Heart failure Maternal Grandmother    Heart attack Maternal Grandfather    Cancer Paternal Grandmother        unsure of type, had mets in colon   Heart attack Paternal Grandfather    Cancer Paternal Grandfather        unknown   Breast cancer Neg Hx    Cervical cancer Neg Hx     Social History   Socioeconomic History   Marital status: Married    Spouse  name: Fredrik Cove   Number of children: 0   Years of education: some college   Highest education level: 12th grade  Occupational History   Occupation: CARE MANGEMENT  Employer: [employer]  Tobacco Use   Smoking status: Never   Smokeless tobacco: Never  Vaping Use   Vaping status: Never Used  Substance and Sexual Activity   Alcohol use: No    Alcohol/week: 0.0 standard drinks of alcohol   Drug use: No   Sexual activity: Not Currently    Birth control/protection: Surgical  Other Topics Concern   Not on file  Social History Narrative   Lives with Geographical information systems officer.  No pets.    Social Drivers of Corporate investment banker Strain: Low Risk  (08/17/2022)   Overall Financial Resource Strain (CARDIA)    Difficulty of Paying Living Expenses: Not hard at all  Food Insecurity: No Food Insecurity (08/17/2022)   Hunger Vital Sign    Worried About Running Out of Food in the Last Year: Never true    Ran Out of Food in the Last Year: Never true  Transportation Needs: No Transportation Needs (08/17/2022)   PRAPARE - Administrator, Civil Service (Medical): No    Lack of Transportation (Non-Medical): No  Physical Activity: Insufficiently Active (08/17/2022)   Exercise Vital Sign    Days of Exercise per Week: 3 days    Minutes of Exercise per Session: 30 min  Stress: No Stress Concern Present (08/17/2022)   [employer] of Occupational Health - Occupational Stress Questionnaire    Feeling of Stress : Only a little  Social Connections: Unknown (08/17/2022)   Social Connection and Isolation Panel [NHANES]    Frequency of Communication with Friends and Family: More than three times a week    Frequency of Social Gatherings with Friends and Family: Once a week    Attends Religious Services: More than 4 times per year    Active Member of [employer] or Organizations: Patient declined    Attends Engineer, structural: Not on file    Marital Status: Married  Catering manager  Violence: Not on file    Review of Systems: Gen: Denies fever, sweats or chills. No weight loss.  CV: Denies chest pain, palpitations or edema. Resp: Denies cough, shortness of breath of hemoptysis.  GI: See HPI. GU :See HPI. MS: Denies joint pain, muscles aches or weakness. Derm: + Rash on legs, generalized itchiness. Psych: Denies depression, anxiety, memory loss or confusion. Heme: Denies easy bruising, bleeding. Neuro:  Denies headaches, dizziness or paresthesias. Endo:  + DM type II.  Physical Exam: Vital signs in last 24 hours: Temp:  [98 F (36.7 C)-98.6 F (37 C)] 98.3 F (36.8 C) (12/19 1049) Pulse Rate:  [71-92] 71 (12/19 1049) Resp:  [16-18] 17 (12/19 1049) BP: (97-136)/(50-77) 97/50 (12/19 1049) SpO2:  [93 %-100 %] 100 % (12/19 1049) Weight:  [94.3 kg] 94.3 kg (12/18 1300)   General:  Alert 67 year old female in no acute distress. Head:  Normocephalic and atraumatic. Eyes:  No scleral icterus. Conjunctiva pink. Ears:  Normal auditory acuity. Nose:  No deformity, discharge or lesions. Mouth:  Dentition intact. No ulcers or lesions.  Neck:  Supple. No lymphadenopathy or thyromegaly.  Lungs: Breath sounds clear throughout. No wheezes, rhonchi or crackles.  Heart: Regular rate and rhythm, no murmurs. Abdomen: Soft, nondistended.  Moderate RUQ tenderness which radiates to the flank area without rebound or guarding.  Positive bowel sounds to all 4 quadrants.  No hepatosplenomegaly.  No palpable ascites.  No bruit. Rectal: Deferred. Musculoskeletal:  Symmetrical without gross deformities.  Pulses:  Normal pulses noted. Extremities:  Without clubbing or edema.  Neurologic:  Alert and  oriented x 4. No focal deficits.  Skin:Moderate jaundice present. Psych:  Alert and cooperative. Normal mood and affect.    Lab Results: Recent Labs    03/31/23 1257  WBC 3.9*  HGB 11.0*  HCT 34.2*  PLT 95*   BMET Recent Labs    03/31/23 1257  NA 134*  K 4.3  CL 106   CO2 15*  GLUCOSE 218*  BUN 30*  CREATININE 1.42*  CALCIUM 8.9   LFT Recent Labs    03/31/23 1257  PROT 7.3  ALBUMIN 2.9*  AST 105*  ALT 49*  ALKPHOS 232*  BILITOT 16.6*   PT/INR Recent Labs    03/31/23 1924  LABPROT 16.2*  INR 1.3*   Lab Results  Component Value Date   LIPASE 398 (H) 03/31/2023       Studies/Results: MR ABDOMEN MRCP W WO CONTAST Result Date: 03/31/2023 CLINICAL DATA:  Intrahepatic/extrahepatic dilatation, concern for choledocholithiasis EXAM: MRI ABDOMEN WITHOUT AND WITH CONTRAST (INCLUDING MRCP) TECHNIQUE: Multiplanar multisequence MR imaging of the abdomen was performed both before and after the administration of intravenous contrast. Heavily T2-weighted images of the biliary and pancreatic ducts were obtained, and three-dimensional MRCP images were rendered by post processing. CONTRAST:  10mL GADAVIST GADOBUTROL 1 MMOL/ML IV SOLN COMPARISON:  CT abdomen/pelvis dated 03/31/2023. FINDINGS: Lower chest: Lung bases are essentially clear. Hepatobiliary: Cirrhosis. No hepatic steatosis. No suspicious/enhancing hepatic lesions. Distended gallbladder, without cholelithiasis or pericholecystic inflammatory changes. Mild layering gallbladder sludge (series 21/image 87). Mild central intrahepatic ductal dilatation. Dilated common duct, measuring 14 mm. Suspected 3 mm distal CBD stone (series 4/image 25). Pancreas:  Within normal limits. Spleen:  Mild splenomegaly. Adrenals/Urinary Tract:  Adrenal glands are within normal limits. Kidneys are within normal limits.  No hydronephrosis. Stomach/Bowel: Stomach is within normal limits. Visualized bowel is grossly unremarkable. Vascular/Lymphatic: No evidence of abdominal aortic aneurysm. Portal vein is patent. Recanalized periumbilical vein. Mild perigastric varices. No suspicious abdominal lymphadenopathy. Other:  Trace upper abdominal ascites. Musculoskeletal: No focal osseous lesions. IMPRESSION: Dilated common duct,  measuring 14 mm, with suspected 3 mm distal CBD stone. Consider ERCP. Mild layering gallbladder sludge. No associated inflammatory changes to suggest acute cholecystitis. Cirrhosis with sequela of portal hypertension, as above. No evidence of HCC. Electronically Signed   By: Charline Bills M.D.   On: 03/31/2023 23:21   CT ABDOMEN PELVIS W CONTRAST Result Date: 03/31/2023 CLINICAL DATA:  Nausea/vomiting, jaundice, fatigue EXAM: CT ABDOMEN AND PELVIS WITH CONTRAST TECHNIQUE: Multidetector CT imaging of the abdomen and pelvis was performed using the standard protocol following bolus administration of intravenous contrast. RADIATION DOSE REDUCTION: This exam was performed according to the departmental dose-optimization program which includes automated exposure control, adjustment of the mA and/or kV according to patient size and/or use of iterative reconstruction technique. CONTRAST:  OMNIPAQUE IOHEXOL 300 MG/ML  SOLN COMPARISON:  Right upper quadrant ultrasound dated 03/31/2023 FINDINGS: Lower chest: 9 mm calcified granuloma at the left lung base, benign. Hepatobiliary: Cirrhosis. Calcified hepatic granulomata. No suspicious hepatic lesions. Gallbladder is unremarkable. Mild central intrahepatic and extrahepatic ductal dilatation. Common duct measuring up to 15 mm (coronal image 2). Pancreas: Within normal limits. Spleen: Enlarged, measuring 15.9 cm in maximal craniocaudal dimension. Calcified splenic granulomata. Adrenals/Urinary Tract: Adrenal glands are within normal limits. Left kidney is within normal limits. Punctate nonobstructing right upper pole renal calculus (series 2/image 28). No hydronephrosis. Bladder is within normal limits. Stomach/Bowel: Stomach is within normal limits. No evidence of bowel obstruction. Normal  appendix (series 2/image 6). Left colonic diverticulosis, without evidence of diverticulitis. Vascular/Lymphatic: No evidence of abdominal aortic aneurysm. Atherosclerotic  calcifications of the abdominal aorta and branch vessels, although vessels remain patent. Portal vein is patent. Recanalized periumbilical vein. Mild perigastric varices. No suspicious abdominopelvic lymphadenopathy. Reproductive: Status post hysterectomy. No adnexal masses. Other: Trace upper abdominal and pelvic ascites. Musculoskeletal: Mild degenerative changes of the visualized thoracolumbar spine. IMPRESSION: Cirrhosis.  No findings suspicious for HCC Mild central intrahepatic and extrahepatic ductal dilatation. Common duct measuring up to 15 mm. In the setting of abnormal LFTs, consider ERCP or MRCP for further evaluation, as clinically warranted. Additional ancillary findings as above. Electronically Signed   By: Charline Bills M.D.   On: 03/31/2023 21:08   US Abdomen Limited RUQ (LIVER/GB) Result Date: 03/31/2023 CLINICAL DATA:  Pain and nausea and vomiting for 2 weeks.  Jaundice EXAM: ULTRASOUND ABDOMEN LIMITED RIGHT UPPER QUADRANT COMPARISON:  Ultrasound 06/10/2021. FINDINGS: Gallbladder: Dilated gallbladder with sludge. No wall thickening or adjacent fluid. No shadowing stones. No reported sonographic Murphy's sign. Common bile duct: Diameter: 7 mm Liver: Heterogeneous liver with some nodular contours. Please correlate for any history of liver disease. There is some intrahepatic biliary ductal dilatation. Portal vein is patent on color Doppler imaging with normal direction of blood flow towards the liver. Other: Possible varices.  Trace ascites IMPRESSION: Nodular heterogeneous liver. Question some varices. Trace ascites suggested. Distended gallbladder with sludge. Mild biliary duct ectasia. Recommend further workup with CT to further delineate these findings when appropriate Electronically Signed   By: Karen Kays M.D.   On: 03/31/2023 15:26    IMPRESSION/PLAN:  67 year old female admitted to the hospital with N/V, RUQ pain and new onset obstructive jaundice with elevated LFTs and lipase  level. Total bili 16.6 -> 16.4. Alk phos 232 -> 199.  AST 105 -> 97.  ALT 49 -> 47. RUQ sonogram showed a nodular heterogeneous liver with questionable varices, trace ascites, a distended gallbladder with sludge and mild intrahepatic biliary ductal dilatation the patent portal vein.  CTAP with contrast showed evidence of cirrhosis with mild central intrahepatic/extrahepatic ductal dilatation, CBD measuring up to 15 mm splenomegaly mild recannulized periumbilical vein, mild perigastric varices  and trace ascites.  Abdominal MRI/MRCP showed a dilated CBD measuring 14 mm with a suspected 3 mm distal CBD stone, mild layering gallbladder sludge without evidence of acute cholecystitis with evidence of cirrhosis with sequela of portal hypertension without evidence of HCC.  Patient [facility] for ERCP. afebrile.  Hemodynamically stable. -ERCP benefits and risks discussed including risk with sedation, risk of bleeding, perforation and infection. Timing to be determined. Await further recommendations per Dr. Leone Payor -NPO after midnight  -IV fluids per the hospitalist -Ondansetron 4 mg p.o. 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  Liver biopsy 03/2022 showed evidence of steatohepatitis.  -EGD at time of ERCP to assess for varices -Patient will need outpatient GI follow-up, she is unsure if she wishes to reestablish GI care at [facility] which is closer to home versus establishing care with [facility]  Thrombocytopenia secondary to cirrhosis and splenomegaly  Normocytic anemia. No overt GI bleeding.  -CBC, iron, ferritin, folate and B12 in am  Coagulopathy secondary to cirrhosis.  INR 1.3 -> 1.4.   DM type II, last dose of Rybelsus  was 2 weeks ago  GERD -Pantoprazole 40 mg p.o.  daily    Arnaldo Natal  04/01/2023, 1:29 PM    [facility] Attending   I have taken an interval history, reviewed the chart and examined the patient. I agree with the Advanced Practitioner's note, impression and recommendations with the following additions:   The patient has obstructive jaundice.  There is a suspected small (3 mm distal CBD stone near the ampulla.  I agree with the radiologist evaluation after reviewing the MRCP images.  It is a bit unusual to have pruritus in the setting of obstructive jaundice from choledocholithiasis.  There are no signs of strictures or masses to suggest tumor which that is more common with.  She has advanced liver disease, previous biopsy did not show cirrhosis but her overall clinical scenario is consistent with that.  There is either a rash or spider angiomata on the upper trunk.  She relates having a rash after starting and stopping Rybelsus a couple of weeks ago.  She is at higher risk of problems with her liver disease, platelets are low and INR is slightly elevated.  No products such as coagulation factors or platelets are needed however with the current status of these parameters.  Lipase was elevated though we do not see pancreatitis on imaging.  Follow-up lipase in the morning as well.  Sometimes that is elevated in the setting of obstructive jaundice from choledocholithiasis.  Do plan to start with an EGD to survey the esophagus for varices and look for other signs of portal hypertension.  Then would proceed to ERCP.  I have fully reviewed the risks benefits and indications of the procedure and she has had ample opportunity to ask questions and all were answered.  I would recommend outpatient hepatology follow-up if possible as opposed to general GI follow-up though she would need that as well.  She does not have gallstones based upon the imaging.  Typically a cholecystectomy would be recommended though she is at higher risk of  complications with cholecystectomy.  Consider surgical consult for their opinion pending ERCP.  Iva Boop, MD, [facility] See Loretha Stapler on call - gastroenterology for best contact person 04/01/2023 3:42 PM

## 2023-04-01 NOTE — ED Provider Notes (Signed)
Patient accepted by Dr. Julian Reil and transport is available. She remains HDS. Questions answered, EMTALA completed.   Shaune Pollack, MD 04/01/23 [phone]

## 2023-04-01 NOTE — ED Notes (Signed)
EMTALA reviewed by charge RN 

## 2023-04-01 NOTE — ED Notes (Signed)
pt accepted to [facility] 6N32 per Tonette Lederer, coordinator. Carll report to [phone]. Accepting is [facility]. [facility] will transport

## 2023-04-01 NOTE — ED Notes (Signed)
At this time, patient is still waiting on available bed at transfer facility. Pt and family updated on the wait.

## 2023-04-01 NOTE — ED Notes (Signed)
Report given to [facility] at this time.  

## 2023-04-01 NOTE — ED Notes (Signed)
Called [facility] to get an update on room assignment spoke to Tammy @ [phone] no bed assigned at this time maybe after shift change closer to 10 am when discharges occur

## 2023-04-02 ENCOUNTER — Inpatient Hospital Stay (HOSPITAL_COMMUNITY): Payer: Commercial Managed Care - PPO

## 2023-04-02 ENCOUNTER — Encounter (HOSPITAL_COMMUNITY)
Admission: AD | Disposition: A | Discharge status: Other Acute Inpatient Hospital | Payer: Self-pay | Source: Other Acute Inpatient Hospital | Attending: Internal Medicine

## 2023-04-02 ENCOUNTER — Encounter (HOSPITAL_COMMUNITY): Payer: Self-pay | Admitting: Internal Medicine

## 2023-04-02 ENCOUNTER — Inpatient Hospital Stay (HOSPITAL_COMMUNITY): Payer: Commercial Managed Care - PPO | Admitting: Anesthesiology

## 2023-04-02 DIAGNOSIS — I8501 Esophageal varices with bleeding: Secondary | ICD-10-CM

## 2023-04-02 DIAGNOSIS — K3189 Other diseases of stomach and duodenum: Secondary | ICD-10-CM | POA: Diagnosis present

## 2023-04-02 DIAGNOSIS — D696 Thrombocytopenia, unspecified: Secondary | ICD-10-CM | POA: Diagnosis not present

## 2023-04-02 DIAGNOSIS — K7460 Unspecified cirrhosis of liver: Secondary | ICD-10-CM | POA: Diagnosis not present

## 2023-04-02 DIAGNOSIS — K766 Portal hypertension: Secondary | ICD-10-CM | POA: Diagnosis not present

## 2023-04-02 DIAGNOSIS — Z4682 Encounter for fitting and adjustment of non-vascular catheter: Secondary | ICD-10-CM | POA: Diagnosis not present

## 2023-04-02 DIAGNOSIS — K838 Other specified diseases of biliary tract: Secondary | ICD-10-CM

## 2023-04-02 DIAGNOSIS — K7469 Other cirrhosis of liver: Secondary | ICD-10-CM | POA: Diagnosis not present

## 2023-04-02 DIAGNOSIS — R932 Abnormal findings on diagnostic imaging of liver and biliary tract: Secondary | ICD-10-CM | POA: Diagnosis not present

## 2023-04-02 DIAGNOSIS — N1832 Chronic kidney disease, stage 3b: Secondary | ICD-10-CM | POA: Insufficient documentation

## 2023-04-02 DIAGNOSIS — K831 Obstruction of bile duct: Secondary | ICD-10-CM

## 2023-04-02 DIAGNOSIS — I8510 Secondary esophageal varices without bleeding: Secondary | ICD-10-CM | POA: Diagnosis not present

## 2023-04-02 DIAGNOSIS — K8020 Calculus of gallbladder without cholecystitis without obstruction: Secondary | ICD-10-CM | POA: Diagnosis not present

## 2023-04-02 HISTORY — PX: BIOPSY: SHX5522

## 2023-04-02 HISTORY — PX: BILIARY BRUSHING: SHX6843

## 2023-04-02 HISTORY — PX: ERCP: SHX5425

## 2023-04-02 HISTORY — PX: ESOPHAGOGASTRODUODENOSCOPY (EGD) WITH PROPOFOL: SHX5813

## 2023-04-02 HISTORY — PX: SPHINCTEROTOMY: SHX5279

## 2023-04-02 HISTORY — PX: BILIARY STENT PLACEMENT: SHX5538

## 2023-04-02 LAB — CBC
HCT: 30.9 % — ABNORMAL LOW (ref 36.0–46.0)
Hemoglobin: 10.3 g/dL — ABNORMAL LOW (ref 12.0–15.0)
MCH: 29.8 pg (ref 26.0–34.0)
MCHC: 33.3 g/dL (ref 30.0–36.0)
MCV: 89.3 fL (ref 80.0–100.0)
Platelets: 78 10*3/uL — ABNORMAL LOW (ref 150–400)
RBC: 3.46 MIL/uL — ABNORMAL LOW (ref 3.87–5.11)
RDW: 17.9 % — ABNORMAL HIGH (ref 11.5–15.5)
WBC: 2.6 10*3/uL — ABNORMAL LOW (ref 4.0–10.5)
nRBC: 0 % (ref 0.0–0.2)

## 2023-04-02 LAB — COMPREHENSIVE METABOLIC PANEL
ALT: 38 U/L (ref 0–44)
AST: 94 U/L — ABNORMAL HIGH (ref 15–41)
Albumin: 2.1 g/dL — ABNORMAL LOW (ref 3.5–5.0)
Alkaline Phosphatase: 187 U/L — ABNORMAL HIGH (ref 38–126)
Anion gap: 7 (ref 5–15)
BUN: 22 mg/dL (ref 8–23)
CO2: 19 mmol/L — ABNORMAL LOW (ref 22–32)
Calcium: 8.3 mg/dL — ABNORMAL LOW (ref 8.9–10.3)
Chloride: 110 mmol/L (ref 98–111)
Creatinine, Ser: 1.17 mg/dL — ABNORMAL HIGH (ref 0.44–1.00)
GFR, Estimated: 52 mL/min — ABNORMAL LOW (ref >60)
Glucose, Bld: 149 mg/dL — ABNORMAL HIGH (ref 70–99)
Potassium: 4.1 mmol/L (ref 3.5–5.1)
Sodium: 136 mmol/L (ref 135–145)
Total Bilirubin: 16.7 mg/dL — ABNORMAL HIGH (ref <1.2)
Total Protein: 6.1 g/dL — ABNORMAL LOW (ref 6.5–8.1)

## 2023-04-02 LAB — GLUCOSE, CAPILLARY
Glucose-Capillary: 152 mg/dL — ABNORMAL HIGH (ref 70–99)
Glucose-Capillary: 144 mg/dL — ABNORMAL HIGH (ref 70–99)
Glucose-Capillary: 122 mg/dL — ABNORMAL HIGH (ref 70–99)
Glucose-Capillary: 276 mg/dL — ABNORMAL HIGH (ref 70–99)
Glucose-Capillary: 305 mg/dL — ABNORMAL HIGH (ref 70–99)

## 2023-04-02 LAB — HIV ANTIBODY (ROUTINE TESTING W REFLEX): HIV Screen 4th Generation wRfx: NONREACTIVE

## 2023-04-02 LAB — LIPASE, BLOOD: Lipase: 595 U/L — ABNORMAL HIGH (ref 11–51)

## 2023-04-02 LAB — PROTIME-INR
INR: 1.3 — ABNORMAL HIGH (ref 0.8–1.2)
Prothrombin Time: 16.5 s — ABNORMAL HIGH (ref 11.4–15.2)

## 2023-04-02 SURGERY — ESOPHAGOGASTRODUODENOSCOPY (EGD) WITH PROPOFOL
Anesthesia: General

## 2023-04-02 MED ORDER — SUGAMMADEX SODIUM 200 MG/2ML IV SOLN
INTRAVENOUS | Status: DC | PRN
  Administered 2023-04-02: 200 mg via INTRAVENOUS

## 2023-04-02 MED ORDER — SODIUM CHLORIDE 0.9 % IV SOLN
1.5000 g | Freq: Once | INTRAVENOUS | Status: AC
  Administered 2023-04-02: 1.5 g via INTRAVENOUS
  Filled 2023-04-02: qty 4

## 2023-04-02 MED ORDER — ONDANSETRON HCL 4 MG/2ML IJ SOLN
INTRAMUSCULAR | Status: DC | PRN
  Administered 2023-04-02: 4 mg via INTRAVENOUS

## 2023-04-02 MED ORDER — SODIUM CHLORIDE 0.9 % IV SOLN: INTRAVENOUS | Status: DC | PRN

## 2023-04-02 MED ORDER — DEXAMETHASONE SODIUM PHOSPHATE 10 MG/ML IJ SOLN
INTRAMUSCULAR | Status: DC | PRN
  Administered 2023-04-02: 5 mg via INTRAVENOUS

## 2023-04-02 MED ORDER — DICLOFENAC SUPPOSITORY 100 MG
RECTAL | Status: DC | PRN
  Administered 2023-04-02: 100 mg via RECTAL

## 2023-04-02 MED ORDER — AQUAPHOR EX OINT: TOPICAL_OINTMENT | CUTANEOUS | Status: DC | PRN

## 2023-04-02 MED ORDER — SODIUM CHLORIDE 0.9 % IV SOLN
INTRAVENOUS | Status: DC | PRN
  Administered 2023-04-02: 40 mL

## 2023-04-02 MED ORDER — GLUCAGON HCL RDNA (DIAGNOSTIC) 1 MG IJ SOLR
INTRAMUSCULAR | Status: AC
  Filled 2023-04-02: qty 1

## 2023-04-02 MED ORDER — LIDOCAINE 2% (20 MG/ML) 5 ML SYRINGE
INTRAMUSCULAR | Status: DC | PRN
  Administered 2023-04-02: 80 mg via INTRAVENOUS

## 2023-04-02 MED ORDER — ROCURONIUM BROMIDE 10 MG/ML (PF) SYRINGE
PREFILLED_SYRINGE | INTRAVENOUS | Status: DC | PRN
  Administered 2023-04-02: 50 mg via INTRAVENOUS

## 2023-04-02 MED ORDER — PROPOFOL 10 MG/ML IV BOLUS
INTRAVENOUS | Status: DC | PRN
  Administered 2023-04-02: 120 mg via INTRAVENOUS

## 2023-04-02 MED ORDER — SODIUM CHLORIDE 0.9 % IV SOLN: INTRAVENOUS | Status: DC

## 2023-04-02 MED ORDER — PHENYLEPHRINE HCL-NACL 20-0.9 MG/250ML-% IV SOLN
INTRAVENOUS | Status: DC | PRN
  Administered 2023-04-02: 20 ug/min via INTRAVENOUS

## 2023-04-02 MED ORDER — DICLOFENAC SUPPOSITORY 100 MG
RECTAL | Status: AC
  Filled 2023-04-02: qty 1

## 2023-04-02 MED ORDER — PHENYLEPHRINE 80 MCG/ML (10ML) SYRINGE FOR IV PUSH (FOR BLOOD PRESSURE SUPPORT)
PREFILLED_SYRINGE | INTRAVENOUS | Status: DC | PRN
  Administered 2023-04-02 (×3): 80 ug via INTRAVENOUS

## 2023-04-02 MED ORDER — GLUCAGON HCL RDNA (DIAGNOSTIC) 1 MG IJ SOLR
INTRAMUSCULAR | Status: DC | PRN
  Administered 2023-04-02: .5 mg via INTRAVENOUS

## 2023-04-02 MED ORDER — PHENYLEPHRINE HCL-NACL 20-0.9 MG/250ML-% IV SOLN: INTRAVENOUS | Status: DC | PRN

## 2023-04-02 MED ORDER — DICLOFENAC SUPPOSITORY 100 MG: 100.0000 mg | Freq: Once | RECTAL | Status: DC

## 2023-04-02 MED ORDER — FENTANYL CITRATE (PF) 100 MCG/2ML IJ SOLN
INTRAMUSCULAR | Status: AC
  Filled 2023-04-02: qty 2

## 2023-04-02 SURGICAL SUPPLY — 14 items

## 2023-04-02 NOTE — Plan of Care (Signed)
  Problem: Education: Goal: Knowledge of General Education information will improve Description: Including pain rating scale, medication(s)/side effects and non-pharmacologic comfort measures Outcome: Progressing   Problem: Coping: Goal: Level of anxiety will decrease Outcome: Progressing   Problem: Pain Management: Goal: General experience of comfort will improve Outcome: Progressing   Problem: Safety: Goal: Ability to remain free from injury will improve Outcome: Progressing

## 2023-04-02 NOTE — Plan of Care (Signed)

## 2023-04-02 NOTE — Consult Note (Signed)
Consult Note  Cindy Trujillo 1956/04/11  [id number].    Requesting MD: Dr. Ophelia Charter Chief Complaint/Reason for Consult: cholelithiasis, choledocholithiasis  HPI:  67 y.o. female with medical history significant for T2DM, R leg DVT not on anticoag, GERD, HLD, endometrial cancer, anemia, bells palsy who presented to [facility] ED 12/18 with abdominal pain, fatigue, pruritus, confusion. She has history of NAFLD - underwent liver bx 03/2022 for NASH after diagnosis of cirrhosis on [place] 05/2021. She was evaluated by GI at [facility]. Symptoms began as fatigue 2 months ago which she attributed to COVID and she has had intermittent nausea/vomiting since before that time she has attributed to having been on ozempic and rybelsus during this time period. No hematemesis. She states pain began 2 weeks ago and has been worsening. Pain is located in epigastrium and RUQ. Pain is 4-5/10 both at rest and with palpation  She was found to have distended GB with sludge on [place], intra and extrahepatic ductal dilatation with 3 mm distal CBD stone on MRCP without findings of acute cholecystitis. She was also found to have findings of cirrhosis with with sequela of portal hypertension, as above. No evidence of HCC. GI consulted and planning EGD to evalute varices/portal hypertension followed by ERCP.  She is accompanied by her husband  Substance use: denies Allergies: sulfa antibiotics Blood thinners: none Past Surgeries: robotic hysterectomy She works for [employer] at [employer]  ROS: Reviewed and as above  Family History  Problem Relation Age of Onset   COPD Mother        heavy smoker   Pulmonary fibrosis Mother    Fibromyalgia Mother    Cancer Father    COPD Father    Melanoma Father    COPD Sister    Pulmonary fibrosis Sister    Pulmonary fibrosis Sister    COPD Sister    Pulmonary fibrosis Sister    COPD Sister    COPD Brother    Healthy Brother    Heart failure Maternal Grandmother    Heart attack Maternal  Grandfather    Cancer Paternal Grandmother        unsure of type, had mets in colon   Heart attack Paternal Grandfather    Cancer Paternal Grandfather        unknown   Breast cancer Neg Hx    Cervical cancer Neg Hx     Past Medical History:  Diagnosis Date   Anemia    Bell's palsy    Diabetes mellitus without complication (HCC)    DVT (deep venous thrombosis) (HCC) 08/2018   right leg   GERD (gastroesophageal reflux disease)    Hyperlipidemia    Post-menopausal bleeding 09/21/2019    Past Surgical History:  Procedure Laterality Date   COLONOSCOPY WITH PROPOFOL N/A 08/23/2017   Procedure: COLONOSCOPY WITH PROPOFOL;  Surgeon: Scot Jun, MD;  Location: [facility] ENDOSCOPY;  Service: Endoscopy;  Laterality: N/A;   ENDOVENOUS ABLATION SAPHENOUS VEIN W/ LASER Left 02/29/2020   endovenous laser ablation left greater saphenous vein and stab phlebectomy 10-20 incisions left leg by Cari Caraway MD    HYSTEROSCOPY WITH D & C N/A 10/31/2019   Procedure: DILATATION AND CURETTAGE /HYSTEROSCOPY WITH MYOSURE RESECTION;  Surgeon: Natale Milch, MD;  Location: [facility] ORS;  Service: Gynecology;  Laterality: N/A;   ROBOTIC ASSISTED TOTAL HYSTERECTOMY WITH BILATERAL SALPINGO OOPHERECTOMY N/A 12/06/2019   Procedure: XI ROBOTIC ASSISTED TOTAL HYSTERECTOMY WITH BILATERAL SALPINGO OOPHORECTOMY;  Surgeon: Artelia Laroche, MD;  Location:  [facility] ORS;  Service: Gynecology;  Laterality: N/A;   SENTINEL NODE BIOPSY N/A 12/06/2019   Procedure: SENTINEL NODE BIOPSY;  Surgeon: Artelia Laroche, MD;  Location: [facility] ORS;  Service: Gynecology;  Laterality: N/A;   TONSILLECTOMY     TONSILLECTOMY AND ADENOIDECTOMY  1968   VEIN SURGERY  05/2019   laser    Social History:  reports that she has never smoked. She has never used smokeless tobacco. She reports that she does not drink alcohol and does not use drugs.  Allergies:  Allergies  Allergen Reactions   Sulfa Antibiotics Hives     Medications Prior to Admission  Medication Sig Dispense Refill   aspirin EC 81 MG tablet Take 1 tablet (81 mg total) by mouth daily. Swallow whole. 90 tablet 0   dapagliflozin propanediol (FARXIGA) 10 MG TABS tablet Take 1 tablet (10 mg total) by mouth daily. 90 tablet 3   ferrous sulfate 325 (65 FE) MG tablet TAKE 1 TABLET BY MOUTH TWICE DAILY (Patient taking differently: Take 325 mg by mouth daily.) 60 tablet 2   guaiFENesin-codeine 100-10 MG/5ML syrup Take 10 mLs by mouth 3 (three) times daily as needed for cough. 120 mL 0   guaiFENesin-dextromethorphan (ROBITUSSIN DM) 100-10 MG/5ML syrup Take 5 mLs by mouth every 4 (four) hours as needed for cough.     lisinopril (ZESTRIL) 5 MG tablet Take 1 tablet (5 mg total) by mouth daily. 90 tablet 3   Magnesium 400 MG TABS Take 1 tablet by mouth 2 (two) times daily.     metFORMIN (GLUCOPHAGE) 1000 MG tablet Take 1 tablet (1,000 mg total) by mouth 2 (two) times daily with a meal. 180 tablet 3   omeprazole (PRILOSEC) 20 MG capsule Take 1 capsule (20 mg total) by mouth daily. 90 capsule 3   rosuvastatin (CRESTOR) 5 MG tablet Take 1 tablet (5 mg total) by mouth daily. 90 tablet 3   Semaglutide (RYBELSUS) 3 MG TABS Take 1 tablet (3 mg total) by mouth daily. (Patient not taking: Reported on 02/23/2023) 90 tablet 1   vitamin B-12 (CYANOCOBALAMIN) 1000 MCG tablet Take 1 tablet (1,000 mcg total) by mouth daily. 90 tablet 1   VITAMIN E PO Take 1 tablet by mouth daily.     fluconazole (DIFLUCAN) 100 MG tablet Take 1 tablet (100 mg total) by mouth daily. (Patient not taking: Reported on 04/01/2023) 1 tablet 0   FreeStyle Unistick II Lancets MISC Use as instructed to check blood glucose daily 100 each 5   glucose blood (FREESTYLE TEST STRIPS) test strip Use as instructed to check blood glucose daily 100 each 12   Vitamin D, Ergocalciferol, (DRISDOL) 1.25 MG (50000 UNIT) CAPS capsule Take 1 capsule (50,000 Units total) by mouth every 7 (seven) days. (Patient not  taking: Reported on 04/01/2023) 12 capsule 0    Blood pressure (!) 127/54, pulse 82, temperature 98 F (36.7 C), temperature source Oral, resp. rate 17, height 5\' 4"  (1.626 m), weight 98.3 kg, SpO2 97%. Physical Exam: General: pleasant, WD, female who is laying in bed in NAD HEENT: head is normocephalic, atraumatic.  Sclera are noninjected.  Pupils equal and round. EOMs intact.  Ears and nose without any masses or lesions.  Mouth is pink and moist Heart: regular, rate, and rhythm.  Normal s1,s2. No obvious murmurs, gallops, or rubs noted.  Palpable radial and pedal pulses bilaterally Lungs: CTAB, no wheezes, rhonchi, or rales noted.  Respiratory effort nonlabored Abd: soft, ND, +BS, mild RUQ TTP without rebound  or guarding MSK: all 4 extremities are symmetrical with no cyanosis, clubbing, or edema. Skin: warm and dry with no masses, lesions, or rashes Neuro: Cranial nerves 2-12 grossly intact, sensation is normal throughout Psych: A&Ox3 with an appropriate affect.    Results for orders placed or performed during the hospital encounter of 04/01/23 (from the past 48 hours)  Glucose, capillary     Status: Abnormal   Collection Time: 04/01/23 11:41 AM  Result Value Ref Range   Glucose-Capillary 119 (H) 70 - 99 mg/dL    Comment: Glucose reference range applies only to samples taken after fasting for at least 8 hours.  CBC     Status: Abnormal   Collection Time: 04/01/23 11:42 AM  Result Value Ref Range   WBC 3.1 (L) 4.0 - 10.5 K/uL   RBC 3.71 (L) 3.87 - 5.11 MIL/uL   Hemoglobin 10.9 (L) 12.0 - 15.0 g/dL   HCT 96.0 (L) 45.4 - 09.8 %   MCV 89.2 80.0 - 100.0 fL   MCH 29.4 26.0 - 34.0 pg   MCHC 32.9 30.0 - 36.0 g/dL   RDW 11.9 (H) 14.7 - 82.9 %   Platelets 94 (L) 150 - 400 K/uL    Comment: Immature Platelet Fraction may be clinically indicated, consider ordering this additional test [id number] REPEATED TO VERIFY    nRBC 0.0 0.0 - 0.2 %    Comment: Performed at [facility],  [address], [place], [place] [address]  Comprehensive metabolic panel     Status: Abnormal   Collection Time: 04/01/23 11:42 AM  Result Value Ref Range   Sodium 137 135 - 145 mmol/L   Potassium 4.3 3.5 - 5.1 mmol/L   Chloride 112 (H) 98 - 111 mmol/L   CO2 17 (L) 22 - 32 mmol/L   Glucose, Bld 135 (H) 70 - 99 mg/dL    Comment: Glucose reference range applies only to samples taken after fasting for at least 8 hours.   BUN 27 (H) 8 - 23 mg/dL   Creatinine, Ser 9.62 (H) 0.44 - 1.00 mg/dL   Calcium 8.6 (L) 8.9 - 10.3 mg/dL   Total Protein 6.6 6.5 - 8.1 g/dL   Albumin 2.4 (L) 3.5 - 5.0 g/dL   AST 97 (H) 15 - 41 U/L   ALT 47 (H) 0 - 44 U/L   Alkaline Phosphatase 199 (H) 38 - 126 U/L   Total Bilirubin 16.4 (H) <1.2 mg/dL   GFR, Estimated 44 (L) >60 mL/min    Comment: (NOTE) Calculated using the CKD-EPI Creatinine Equation (2021)    Anion gap 8 5 - 15    Comment: Performed at [facility], [address], [place], [place] [address]  Bilirubin, direct     Status: Abnormal   Collection Time: 04/01/23 11:42 AM  Result Value Ref Range   Bilirubin, Direct 9.0 (H) 0.0 - 0.2 mg/dL    Comment: Performed at [facility], [address] [place], [place] [address]  Protime-INR     Status: Abnormal   Collection Time: 04/01/23 11:42 AM  Result Value Ref Range   Prothrombin Time 16.9 (H) 11.4 - 15.2 seconds   INR 1.4 (H) 0.8 - 1.2    Comment: (NOTE) INR goal varies based on device and disease states. Performed at [facility], [address] [place], [place] [address]   Glucose, capillary     Status: Abnormal   Collection Time: 04/01/23  4:30 PM  Result  Value Ref Range   Glucose-Capillary 155 (H) 70 - 99 mg/dL    Comment: Glucose reference range applies only to samples taken after fasting for at least 8 hours.  Hepatitis panel, acute     Status: None   Collection Time: 04/01/23  6:09 PM  Result Value Ref Range   Hepatitis B Surface Ag NON REACTIVE NON REACTIVE   HCV Ab NON  REACTIVE NON REACTIVE    Comment: (NOTE) Nonreactive HCV antibody screen is consistent with no HCV infections,  unless recent infection is suspected or other evidence exists to indicate HCV infection.     Hep A IgM NON REACTIVE NON REACTIVE   Hep B C IgM NON REACTIVE NON REACTIVE    Comment: Performed at [facility], [address], [place], [place] [address]  Glucose, capillary     Status: Abnormal   Collection Time: 04/01/23 10:12 PM  Result Value Ref Range   Glucose-Capillary 179 (H) 70 - 99 mg/dL    Comment: Glucose reference range applies only to samples taken after fasting for at least 8 hours.  CBC     Status: Abnormal   Collection Time: 04/02/23  6:40 AM  Result Value Ref Range   WBC 2.6 (L) 4.0 - 10.5 K/uL   RBC 3.46 (L) 3.87 - 5.11 MIL/uL   Hemoglobin 10.3 (L) 12.0 - 15.0 g/dL   HCT 62.1 (L) 30.8 - 65.7 %   MCV 89.3 80.0 - 100.0 fL   MCH 29.8 26.0 - 34.0 pg   MCHC 33.3 30.0 - 36.0 g/dL   RDW 84.6 (H) 96.2 - 95.2 %   Platelets 78 (L) 150 - 400 K/uL    Comment: Immature Platelet Fraction may be clinically indicated, consider ordering this additional test [id number] CONSISTENT WITH PREVIOUS RESULT REPEATED TO VERIFY    nRBC 0.0 0.0 - 0.2 %    Comment: Performed at [facility], [address] [place], [place] [address]  Comprehensive metabolic panel     Status: Abnormal   Collection Time: 04/02/23  6:40 AM  Result Value Ref Range   Sodium 136 135 - 145 mmol/L   Potassium 4.1 3.5 - 5.1 mmol/L   Chloride 110 98 - 111 mmol/L   CO2 19 (L) 22 - 32 mmol/L   Glucose, Bld 149 (H) 70 - 99 mg/dL    Comment: Glucose reference range applies only to samples taken after fasting for at least 8 hours.   BUN 22 8 - 23 mg/dL   Creatinine, Ser 5.36 (H) 0.44 - 1.00 mg/dL   Calcium 8.3 (L) 8.9 - 10.3 mg/dL   Total Protein 6.1 (L) 6.5 - 8.1 g/dL   Albumin 2.1 (L) 3.5 - 5.0 g/dL   AST 94 (H) 15 - 41 U/L   ALT 38 0 - 44 U/L   Alkaline Phosphatase 187 (H) 38 - 126 U/L    Total Bilirubin 16.7 (H) <1.2 mg/dL   GFR, Estimated 52 (L) >60 mL/min    Comment: (NOTE) Calculated using the CKD-EPI Creatinine Equation (2021)    Anion gap 7 5 - 15    Comment: Performed at [facility], [address] [place], [place] [address]  Protime-INR     Status: Abnormal   Collection Time: 04/02/23  6:40 AM  Result Value Ref Range   Prothrombin Time 16.5 (H) 11.4 - 15.2 seconds   INR 1.3 (H) 0.8 - 1.2    Comment: (NOTE) INR goal varies based on  device and disease states. Performed at [facility], [address], [place], [place] [address]   Lipase, blood     Status: Abnormal   Collection Time: 04/02/23  6:40 AM  Result Value Ref Range   Lipase 595 (H) 11 - 51 U/L    Comment: RESULTS CONFIRMED BY MANUAL DILUTION Performed at [facility], [address], [place], [place] [address]   Glucose, capillary     Status: Abnormal   Collection Time: 04/02/23  8:59 AM  Result Value Ref Range   Glucose-Capillary 152 (H) 70 - 99 mg/dL    Comment: Glucose reference range applies only to samples taken after fasting for at least 8 hours.   MR ABDOMEN MRCP W WO CONTAST Result Date: 03/31/2023 CLINICAL DATA:  Intrahepatic/extrahepatic dilatation, concern for choledocholithiasis EXAM: MRI ABDOMEN WITHOUT AND WITH CONTRAST (INCLUDING MRCP) TECHNIQUE: Multiplanar multisequence MR imaging of the abdomen was performed both before and after the administration of intravenous contrast. Heavily T2-weighted images of the biliary and pancreatic ducts were obtained, and three-dimensional MRCP images were rendered by post processing. CONTRAST:  10mL GADAVIST GADOBUTROL 1 MMOL/ML IV SOLN COMPARISON:  CT abdomen/pelvis dated 03/31/2023. FINDINGS: Lower chest: Lung bases are essentially clear. Hepatobiliary: Cirrhosis. No hepatic steatosis. No suspicious/enhancing hepatic lesions. Distended gallbladder, without cholelithiasis or pericholecystic inflammatory changes. Mild layering  gallbladder sludge (series 21/image 87). Mild central intrahepatic ductal dilatation. Dilated common duct, measuring 14 mm. Suspected 3 mm distal CBD stone (series 4/image 25). Pancreas:  Within normal limits. Spleen:  Mild splenomegaly. Adrenals/Urinary Tract:  Adrenal glands are within normal limits. Kidneys are within normal limits.  No hydronephrosis. Stomach/Bowel: Stomach is within normal limits. Visualized bowel is grossly unremarkable. Vascular/Lymphatic: No evidence of abdominal aortic aneurysm. Portal vein is patent. Recanalized periumbilical vein. Mild perigastric varices. No suspicious abdominal lymphadenopathy. Other:  Trace upper abdominal ascites. Musculoskeletal: No focal osseous lesions. IMPRESSION: Dilated common duct, measuring 14 mm, with suspected 3 mm distal CBD stone. Consider ERCP. Mild layering gallbladder sludge. No associated inflammatory changes to suggest acute cholecystitis. Cirrhosis with sequela of portal hypertension, as above. No evidence of HCC. Electronically Signed   By: Charline Bills M.D.   On: 03/31/2023 23:21   CT ABDOMEN PELVIS W CONTRAST Result Date: 03/31/2023 CLINICAL DATA:  Nausea/vomiting, jaundice, fatigue EXAM: CT ABDOMEN AND PELVIS WITH CONTRAST TECHNIQUE: Multidetector CT imaging of the abdomen and pelvis was performed using the standard protocol following bolus administration of intravenous contrast. RADIATION DOSE REDUCTION: This exam was performed according to the departmental dose-optimization program which includes automated exposure control, adjustment of the mA and/or kV according to patient size and/or use of iterative reconstruction technique. CONTRAST:  OMNIPAQUE IOHEXOL 300 MG/ML  SOLN COMPARISON:  Right upper quadrant ultrasound dated 03/31/2023 FINDINGS: Lower chest: 9 mm calcified granuloma at the left lung base, benign. Hepatobiliary: Cirrhosis. Calcified hepatic granulomata. No suspicious hepatic lesions. Gallbladder is unremarkable.  Mild central intrahepatic and extrahepatic ductal dilatation. Common duct measuring up to 15 mm (coronal image 2). Pancreas: Within normal limits. Spleen: Enlarged, measuring 15.9 cm in maximal craniocaudal dimension. Calcified splenic granulomata. Adrenals/Urinary Tract: Adrenal glands are within normal limits. Left kidney is within normal limits. Punctate nonobstructing right upper pole renal calculus (series 2/image 28). No hydronephrosis. Bladder is within normal limits. Stomach/Bowel: Stomach is within normal limits. No evidence of bowel obstruction. Normal appendix (series 2/image 6). Left colonic diverticulosis, without evidence of diverticulitis. Vascular/Lymphatic: No evidence of abdominal aortic aneurysm. Atherosclerotic calcifications of the abdominal  aorta and branch vessels, although vessels remain patent. Portal vein is patent. Recanalized periumbilical vein. Mild perigastric varices. No suspicious abdominopelvic lymphadenopathy. Reproductive: Status post hysterectomy. No adnexal masses. Other: Trace upper abdominal and pelvic ascites. Musculoskeletal: Mild degenerative changes of the visualized thoracolumbar spine. IMPRESSION: Cirrhosis.  No findings suspicious for HCC Mild central intrahepatic and extrahepatic ductal dilatation. Common duct measuring up to 15 mm. In the setting of abnormal LFTs, consider ERCP or MRCP for further evaluation, as clinically warranted. Additional ancillary findings as above. Electronically Signed   By: Charline Bills M.D.   On: 03/31/2023 21:08   US Abdomen Limited RUQ (LIVER/GB) Result Date: 03/31/2023 CLINICAL DATA:  Pain and nausea and vomiting for 2 weeks.  Jaundice EXAM: ULTRASOUND ABDOMEN LIMITED RIGHT UPPER QUADRANT COMPARISON:  Ultrasound 06/10/2021. FINDINGS: Gallbladder: Dilated gallbladder with sludge. No wall thickening or adjacent fluid. No shadowing stones. No reported sonographic Murphy's sign. Common bile duct: Diameter: 7 mm Liver: Heterogeneous  liver with some nodular contours. Please correlate for any history of liver disease. There is some intrahepatic biliary ductal dilatation. Portal vein is patent on color Doppler imaging with normal direction of blood flow towards the liver. Other: Possible varices.  Trace ascites IMPRESSION: Nodular heterogeneous liver. Question some varices. Trace ascites suggested. Distended gallbladder with sludge. Mild biliary duct ectasia. Recommend further workup with CT to further delineate these findings when appropriate Electronically Signed   By: Karen Kays M.D.   On: 03/31/2023 15:26      Assessment/Plan Choledocholithiasis Gallbladder sludge Cirrhosis Thrombocytopenia  Patient seen and examined and relevant labs and imaging personally reviewed. Work up, history, and exam consistent with choledocholithiasis. Gallbladder does not appear infected on imaging. GI is following for choledocholithiasis with plans for EGD and eventual ERCP. MELD score calculated from labs in October pre obstruction as 15. In setting of cirrhosis surgical intervention carries prohibitive morbidity/mortality risk and is not indicated at this time.  FEN: NPO ID: none VTE: held in setting of thrombocytopenia   I reviewed ED provider notes, Consultant GI notes, hospitalist notes, last 24 h vitals and pain scores, last 48 h intake and output, last 24 h labs and trends, and last 24 h imaging results.   Eric Form, [facility] 04/02/2023, 9:51 AM Please see Amion for pager number during day hours 7:00am-4:30pm

## 2023-04-02 NOTE — Interval H&P Note (Signed)
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  The various methods of treatment have been discussed with the patient and family. After consideration of risks, benefits and other options for treatment, the patient has consented to  Procedure(s): ESOPHAGOGASTRODUODENOSCOPY (EGD) WITH PROPOFOL (N/A) ENDOSCOPIC RETROGRADE CHOLANGIOPANCREATOGRAPHY (ERCP) (N/A) as a surgical intervention.  The patient's history has been reviewed, patient examined, no change in status, stable for surgery.  I have reviewed the patient's chart and labs.  Questions were answered to the patient's satisfaction.     Stan Head

## 2023-04-02 NOTE — Transfer of Care (Signed)
Immediate Anesthesia Transfer of Care Note  Patient: Cindy Trujillo  Procedure(s) Performed: ESOPHAGOGASTRODUODENOSCOPY (EGD) WITH PROPOFOL ENDOSCOPIC RETROGRADE CHOLANGIOPANCREATOGRAPHY (ERCP) BILIARY BRUSHING BILIARY STENT PLACEMENT SPHINCTEROTOMY BIOPSY  Patient Location: PACU and Endoscopy Unit  Anesthesia Type:General  Level of Consciousness: sedated  Airway & Oxygen Therapy: Patient Spontanous Breathing and Patient connected to face mask oxygen  Post-op Assessment: Report given to RN and Post -op Vital signs reviewed and stable  Post vital signs: Reviewed and stable  Last Vitals:  Vitals Value Taken Time  BP 116/56 04/02/23 1601  Temp    Pulse 82 04/02/23 1603  Resp 24 04/02/23 1603  SpO2 99 % 04/02/23 1603  Vitals shown include unfiled device data.  Last Pain:  Vitals:   04/02/23 1403  TempSrc: Temporal  PainSc: 0-No pain      Patients Stated Pain Goal: 3 (04/02/23 0825)  Complications: No notable events documented.

## 2023-04-02 NOTE — Anesthesia Preprocedure Evaluation (Signed)
Anesthesia Evaluation  Patient identified by MRN, date of birth, ID band Patient awake    Reviewed: Allergy & Precautions, NPO status , Patient's Chart, lab work & pertinent test results  Airway Mallampati: II  TM Distance: >3 FB Neck ROM: Full    Dental no notable dental hx. (+) Teeth Intact, Dental Advisory Given   Pulmonary neg pulmonary ROS   Pulmonary exam normal breath sounds clear to auscultation       Cardiovascular + DVT  Normal cardiovascular exam Rhythm:Regular Rate:Normal     Neuro/Psych  Headaches  negative psych ROS   GI/Hepatic Neg liver ROS,GERD  ,,  Endo/Other  diabetes, Type 2, Oral Hypoglycemic Agents    Renal/GU Renal InsufficiencyRenal diseaseLab Results      Component                Value               Date                      NA                       136                 04/02/2023                CL                       110                 04/02/2023                K                        4.1                 04/02/2023                CO2                      19 (L)              04/02/2023                BUN                      22                  04/02/2023                CREATININE               1.17 (H)            04/02/2023                GFRNONAA                 52 (L)              04/02/2023                CALCIUM                  8.3 (L)             04/02/2023  ALBUMIN                  2.1 (L)             04/02/2023                GLUCOSE                  149 (H)             04/02/2023          '   negative genitourinary   Musculoskeletal negative musculoskeletal ROS (+)    Abdominal   Peds  Hematology  (+) Blood dyscrasia (thrombocytopenia), anemia Lab Results      Component                Value               Date                      WBC                      2.6 (L)             04/02/2023                HGB                      10.3 (L)            04/02/2023                 HCT                      30.9 (L)            04/02/2023                MCV                      89.3                04/02/2023                PLT                      78 (L)              04/02/2023              Anesthesia Other Findings   Reproductive/Obstetrics                             Anesthesia Physical Anesthesia Plan  ASA: 3  Anesthesia Plan: General   Post-op Pain Management:    Induction: Intravenous  PONV Risk Score and Plan: 3 and Midazolam, Dexamethasone and Ondansetron  Airway Management Planned: Oral ETT  Additional Equipment:   Intra-op Plan:   Post-operative Plan: Extubation in OR  Informed Consent: I have reviewed the patients History and Physical, chart, labs and discussed the procedure including the risks, benefits and alternatives for the proposed anesthesia with the patient or authorized representative who has indicated his/her understanding and acceptance.     Dental advisory given  Plan Discussed with: CRNA  Anesthesia Plan Comments:        Anesthesia Quick Evaluation

## 2023-04-02 NOTE — Op Note (Signed)
[facility] Patient Name: Cindy Trujillo Procedure Date : 04/02/2023 MRN: [id number] Attending MD: Iva Boop , MD, [id number] Date of Birth: [birth year] CSN: [id number] Age: 67 Admit Type: Inpatient Procedure:                EGD and ERCP Indications:              Abnormal MRCP, Suspected bile duct stone(s), For                            therapy of bile duct stone(s), Jaundice Providers:                Iva Boop, MD, Suzy Bouchard, RN, Alan Ripper,                            Technician, Harrington Challenger, Technician Referring MD:             Eartha Inch. [place] Cerise Medicines:                General Anesthesia, Unasyn 1.5 g IV, diclofenac 100                            mg per rectum Complications:            No immediate complications. Estimated Blood Loss:     Estimated blood loss was minimal. Procedure:                Pre-Anesthesia Assessment:                           - Prior to the procedure, a History and Physical                            was performed, and patient medications and                            allergies were reviewed. The patient's tolerance of                            previous anesthesia was also reviewed. The risks                            and benefits of the procedure and the sedation                            options and risks were discussed with the patient.                            All questions were answered, and informed consent                            was obtained. Prior Anticoagulants: The patient has                            taken no anticoagulant or antiplatelet agents. ASA  Grade Assessment: III - A patient with severe                            systemic disease. After reviewing the risks and                            benefits, the patient was deemed in satisfactory                            condition to undergo the procedure.                           After obtaining informed consent, the scope was                             passed under direct vision. Throughout the                            procedure, the patient's blood pressure, pulse, and                            oxygen saturations were monitored continuously. The                            [employer] D single use                            duodenoscope was introduced through the mouth, and                            used to inject contrast into and used to inject                            contrast into the bile duct. The GIF-H190 ([id number])                            Olympus endoscope was introduced through the and                            used to inject contrast into. The ERCP was somewhat                            difficult due to challenging cannulation because of                            abnormal anatomy. Successful completion of the                            procedure was aided by using smaller sphincterotome                            and 025 wire. The patient tolerated the procedure  well. Scope In: Scope Out: Findings:      The scout film was normal. A standard esophagogastroduodenoscopy scope       was used for the examination of the upper gastrointestinal tract. The       scope was passed under direct vision through the upper GI tract. Grade       II varices were found in the lower third of the esophagus. They were 4       mm in largest diameter. Mild portal hypertensive gastropathy was found       in the entire examined stomach. The upper GI exam was otherwise without       abnormality. The major papilla was enlarged significantly and no bile       draining with flat orifice. it was firm. O25 wire was was passed into       the biliary tree using the smaller sphincterotome after several       unsuccessful attempts with standard sphincterote and 035 wire which       tracked along duodenal wall a short distance. the bile duct was filled       with contrast and I  personally interpreted the images. there was diffuse       dilation above a very short ampullary/distal CBD stricture. 14 mm       maximal dilation. Gallbladder did not fill. Intrahepatics diffiusely and       moderately dilated. I did not see a stone as suspected on MRCP. I       performed a 5 mm biliary sphincterotomy over a the wire and there was       slight ooze of blood and mininal bile drainage. Then a 9-12 mm retrieval       balloon was withdrawn and no objects/stones discovered. the balloon       would not pull through ampullary area until deflated. Dark bile drained       some more. I then performed cytology brushings x 2 of the stricture of       ampulla/distal CBD. Subsequently a 5 cm 10 Fr plastic stent was placed       with copious dark bile drainage. I then biopsied the ampulla. No       pancreas entry. Impression:               - Grade II esophageal varices. No stigmnata of                            bleeding                           - Portal hypertensive gastropathy. Mild                           - The major papilla appeared to be enlarged and                            abnormal and raises ? ampullary tumor. Biopsied.                           - A single localized biliary stricture was found in  the ampulla/distal common bile duct. The stricture                            was indeterminate. This was aspirated and the fluid                            was sent for cytology. This stricture was treated                            with stent placement. 5 cm 25fr plastic stent Recommendation:           - Clear liquids today and advance diet tomorrow                            depending on course                           F/U labs and sxs (pruritis), check CA 19-9                           f/U path and cytology                           Next steps will be determined by pathology and                            clinical course - she is not an operative  candidate                           EUS may be needed electively                           Consider carvedilol given portal hypertension,                            varices though they are not high-risk Procedure Code(s):        --- Professional ---                           [id number], Endoscopic retrograde                            cholangiopancreatography (ERCP); with placement of                            endoscopic stent into biliary or pancreatic duct,                            including pre- and post-dilation and guide wire                            passage, when performed, including sphincterotomy,                            when performed, each stent  16109, 59, Endoscopic retrograde                            cholangiopancreatography (ERCP); with                            sphincterotomy/papillotomy                           43239, Esophagogastroduodenoscopy, flexible,                            transoral; with biopsy, single or multiple                           74328, Endoscopic catheterization of the biliary                            ductal system, radiological supervision and                            interpretation Diagnosis Code(s):        --- Professional ---                           I85.00, Esophageal varices without bleeding                           K76.6, Portal hypertension                           K31.89, Other diseases of stomach and duodenum                           K80.51, Calculus of bile duct without cholangitis                            or cholecystitis with obstruction                           R17, Unspecified jaundice                           K83.8, Other specified diseases of biliary tract                           R93.2, Abnormal findings on diagnostic imaging of                            liver and biliary tract CPT copyright 2022 [employer]. All rights reserved. The codes documented in this report are  preliminary and upon coder review may  be revised to meet current compliance requirements. Iva Boop, MD 04/02/2023 4:18:54 PM This report has been signed electronically. Number of Addenda: 0

## 2023-04-02 NOTE — Progress Notes (Signed)
Progress Note   Patient: Cindy Trujillo EGB:[id number] DOB: 26-Jun-1955 DOA: 04/01/2023     1 DOS: the patient was seen and examined on 04/02/2023   Brief hospital course: 67yo with h/o DM, HTN, HLD, and DVT who presented to [facility] on 12/19 with abdominal pain and jaundice.  Bili 16.6.  [place] with nodular liver, trace ascites, ?varices, distended gallbladder with sludge.  CT with cirrhosis and CBD dilatation to 15 mm.  MRCP with CBD dilated to 14 mm, suspected 3 mm CBD stone.  She was transferred to [facility] for ERCP.  GI is consulting, plan for EGD, ERCP.  Surgery also consulted.  Assessment and Plan:  RUQ pain and jaundice Patient presenting with liver failure, bili >16 No history of alcoholism or excess use of Tylenol Hepatitis panel negative Imaging indicates liver cirrhosis (NASH) with CBD dilatation, suspicious for stone, mild ascites, ? varices GI is consulting and recommends EGD today and then ERCP She appears to also need cholecystectomy so surgery was consulted, although they may decide to do a HIDA scan first On PPI  Cirrhosis No known h/o liver disease MELD score of 15 is the threshold for a patient survival with transplantation > survival without transplantation; patients should be considered for transplant referral in this circumstance. 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 Baseline creatinine 1.42 from October, stable Serum bicarb was significant low at 15, given bicarbonate drip Will transition to NS at 100 cc/hr for an additional 10 hours   H/o right leg DVT Several years ago Completed 6 months course of Eliquis   Type 2 diabetes mellitus A1c 7.5 on October 2024, fair but not optimal control Hold Farxiga, metformin  Will add sensitive-scale SSI   HTN On lisinopril for HTN Marginal BPs on  presentation, now improved but not high enough to need to restart lisinopril Can resume when BPs >140/90   HLD Hold aspirin due to procedures Avoid Crestor because of cirrhosis and elevated liver enzymes   Class 2 Obesity Body mass index is 37.2 kg/m.Marland Kitchen  Weight loss should be encouraged Outpatient PCP/bariatric medicine/bariatric surgery f/u encouraged     Consultants: GI Surgery  Procedures: EGD 12/20  Antibiotics: None?  30 Day Unplanned Readmission Risk Score    Flowsheet Row Admission (Current) from 04/01/2023 in [facility]  SURGICAL  30 Day Unplanned Readmission Risk Score (%) 18.45 Filed at 04/02/2023 0801       This score is the patient's risk of an unplanned readmission within 30 days of being discharged (0 -100%). The score is based on dignosis, age, lab data, medications, orders, and past utilization.   Low:  0-14.9   Medium: 15-21.9   High: 22-29.9   Extreme: 30 and above           Subjective: She continues to have RUQ pain and remains jaundiced.  Otherwise, no complaints.   Objective: Vitals:   04/02/23 0609 04/02/23 0903  BP: (!) 98/43 (!) 127/54  Pulse: 71 82  Resp: 16 17  Temp: 98.6 F (37 C) 98 F (36.7 C)  SpO2: 97% 97%    Intake/Output Summary (Last 24 hours) at 04/02/2023 1321 Last data filed at 04/02/2023 0903 Gross per 24 hour  Intake 1076.38 ml  Output 300 ml  Net 776.38 ml   Filed Weights   04/01/23 1300  Weight: 98.3 kg    Exam:  General:  Appears calm and comfortable and is in NAD; jaundiced Eyes:  PERRL, EOMI, normal lids, scleral icterus ENT:  grossly normal hearing, lips & tongue, mmm Neck:  no LAD, masses or thyromegaly Cardiovascular:  RRR, no m/r/g. No LE edema.  Respiratory:   CTA bilaterally with no wheezes/rales/rhonchi.  Normal respiratory effort. Abdomen:  mild distention, no overt ascites, + RUQ TTP Skin:  frankly jaundiced Musculoskeletal:  grossly normal tone BUE/BLE, good  ROM, no bony abnormality Psychiatric:  grossly normal mood and affect, speech fluent and appropriate, AOx3 Neurologic:  CN 2-12 grossly intact, moves all extremities in coordinated fashion  Data Reviewed: I have reviewed the patient's lab results since admission.  Pertinent labs for today include:   CO2 19 Glucose 149 BUN 22/Creatinine 1.17/GFR 52 - stable AP 187 Albumin 2.1 Lipase 595 AST 94/ALT 38/Bili 16.7 - stable WBC 2.6 Hgb 10.3 Platelets 78 INR 1.3     Family Communication: Husband was present throughout evaluation  Disposition: Status is: Inpatient Remains inpatient appropriate because: ongoing evaluation and management     Time spent: 50 minutes  Unresulted Labs (From admission, onward)     Start     Ordered   04/03/23 0500  CBC with Differential/Platelet  Tomorrow morning,   R       Question:  Specimen collection method  Answer:  Lab=Lab collect   04/02/23 1251   04/03/23 0500  Comprehensive metabolic panel  Tomorrow morning,   R       Question:  Specimen collection method  Answer:  Lab=Lab collect   04/02/23 1251             Author: Jonah Blue, MD 04/02/2023 1:21 PM  For on call review [email or web address].

## 2023-04-02 NOTE — Anesthesia Procedure Notes (Signed)
Procedure Name: Intubation Date/Time: 04/02/2023 2:54 PM  Performed by: Alwyn Ren, CRNAPre-anesthesia Checklist: Patient identified, Emergency Drugs available, Suction available and Patient being monitored Patient Re-evaluated:Patient Re-evaluated prior to induction Oxygen Delivery Method: Circle system utilized Preoxygenation: Pre-oxygenation with 100% oxygen Induction Type: IV induction Ventilation: Mask ventilation without difficulty Laryngoscope Size: Miller and 2 Grade View: Grade II Tube type: Oral Tube size: 7.0 mm Number of attempts: 1 Airway Equipment and Method: Stylet and Oral airway Placement Confirmation: ETT inserted through vocal cords under direct vision, positive ETCO2 and breath sounds checked- equal and bilateral Secured at: 22 cm Tube secured with: Tape Dental Injury: Teeth and Oropharynx as per pre-operative assessment

## 2023-04-02 NOTE — Hospital Course (Signed)
67yo with h/o DM, HTN, HLD, and DVT who presented to [facility] on 12/19 with abdominal pain and jaundice.  Bili 16.6.  [place] with nodular liver, trace ascites, ?varices, distended gallbladder with sludge.  CT with cirrhosis and CBD dilatation to 15 mm.  MRCP with CBD dilated to 14 mm, suspected 3 mm CBD stone.  She was transferred to [facility] for ERCP.  Surgery consulted. GI consulting, performed for EGD, ERCP on 12/20 which showed grade 2 esophageal varices with mild portal hypertensive gastropathy.  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  Pathology is pending but she is not a surgical candidate and surgery has signed off.  CA 19-9 is pending.

## 2023-04-03 DIAGNOSIS — K766 Portal hypertension: Secondary | ICD-10-CM | POA: Diagnosis not present

## 2023-04-03 DIAGNOSIS — D649 Anemia, unspecified: Secondary | ICD-10-CM | POA: Diagnosis not present

## 2023-04-03 DIAGNOSIS — I8510 Secondary esophageal varices without bleeding: Secondary | ICD-10-CM | POA: Diagnosis not present

## 2023-04-03 DIAGNOSIS — D684 Acquired coagulation factor deficiency: Secondary | ICD-10-CM

## 2023-04-03 DIAGNOSIS — K7460 Unspecified cirrhosis of liver: Secondary | ICD-10-CM | POA: Diagnosis not present

## 2023-04-03 DIAGNOSIS — K3189 Other diseases of stomach and duodenum: Secondary | ICD-10-CM

## 2023-04-03 DIAGNOSIS — D696 Thrombocytopenia, unspecified: Secondary | ICD-10-CM | POA: Diagnosis not present

## 2023-04-03 DIAGNOSIS — K831 Obstruction of bile duct: Secondary | ICD-10-CM | POA: Diagnosis not present

## 2023-04-03 LAB — CBC WITH DIFFERENTIAL/PLATELET
Abs Immature Granulocytes: 0 10*3/uL (ref 0.00–0.07)
Basophils Absolute: 0 10*3/uL (ref 0.0–0.1)
Basophils Relative: 0 %
Eosinophils Absolute: 0 10*3/uL (ref 0.0–0.5)
Eosinophils Relative: 0 %
HCT: 30.4 % — ABNORMAL LOW (ref 36.0–46.0)
Hemoglobin: 9.9 g/dL — ABNORMAL LOW (ref 12.0–15.0)
Immature Granulocytes: 0 %
Lymphocytes Relative: 14 %
Lymphs Abs: 0.3 10*3/uL — ABNORMAL LOW (ref 0.7–4.0)
MCH: 29.3 pg (ref 26.0–34.0)
MCHC: 32.6 g/dL (ref 30.0–36.0)
MCV: 89.9 fL (ref 80.0–100.0)
Monocytes Absolute: 0.1 10*3/uL (ref 0.1–1.0)
Monocytes Relative: 4 %
Neutro Abs: 1.6 10*3/uL — ABNORMAL LOW (ref 1.7–7.7)
Neutrophils Relative %: 82 %
Platelets: 62 10*3/uL — ABNORMAL LOW (ref 150–400)
RBC: 3.38 MIL/uL — ABNORMAL LOW (ref 3.87–5.11)
RDW: 17.7 % — ABNORMAL HIGH (ref 11.5–15.5)
WBC: 1.9 10*3/uL — ABNORMAL LOW (ref 4.0–10.5)
nRBC: 0 % (ref 0.0–0.2)

## 2023-04-03 LAB — COMPREHENSIVE METABOLIC PANEL
ALT: 40 U/L (ref 0–44)
AST: 83 U/L — ABNORMAL HIGH (ref 15–41)
Albumin: 2 g/dL — ABNORMAL LOW (ref 3.5–5.0)
Alkaline Phosphatase: 168 U/L — ABNORMAL HIGH (ref 38–126)
Anion gap: 10 (ref 5–15)
BUN: 35 mg/dL — ABNORMAL HIGH (ref 8–23)
CO2: 18 mmol/L — ABNORMAL LOW (ref 22–32)
Calcium: 8.4 mg/dL — ABNORMAL LOW (ref 8.9–10.3)
Chloride: 109 mmol/L (ref 98–111)
Creatinine, Ser: 2.06 mg/dL — ABNORMAL HIGH (ref 0.44–1.00)
GFR, Estimated: 26 mL/min — ABNORMAL LOW (ref >60)
Glucose, Bld: 261 mg/dL — ABNORMAL HIGH (ref 70–99)
Potassium: 4.7 mmol/L (ref 3.5–5.1)
Sodium: 137 mmol/L (ref 135–145)
Total Bilirubin: 18.9 mg/dL (ref <1.2)
Total Protein: 6 g/dL — ABNORMAL LOW (ref 6.5–8.1)

## 2023-04-03 LAB — GLUCOSE, CAPILLARY
Glucose-Capillary: 213 mg/dL — ABNORMAL HIGH (ref 70–99)
Glucose-Capillary: 299 mg/dL — ABNORMAL HIGH (ref 70–99)
Glucose-Capillary: 284 mg/dL — ABNORMAL HIGH (ref 70–99)
Glucose-Capillary: 224 mg/dL — ABNORMAL HIGH (ref 70–99)

## 2023-04-03 LAB — LIPASE, BLOOD: Lipase: 156 U/L — ABNORMAL HIGH (ref 11–51)

## 2023-04-03 MED ORDER — ONDANSETRON HCL 4 MG/2ML IJ SOLN
4.0000 mg | Freq: Four times a day (QID) | INTRAMUSCULAR | Status: AC | PRN
Start: 2023-04-03 — End: 2023-04-04
  Administered 2023-04-03 – 2023-04-04 (×2): 4 mg via INTRAVENOUS
  Filled 2023-04-03 (×2): qty 2

## 2023-04-03 MED ORDER — CAMPHOR-MENTHOL 0.5-0.5 % EX LOTN
TOPICAL_LOTION | CUTANEOUS | Status: DC | PRN
  Administered 2023-04-08: 1 via TOPICAL
  Filled 2023-04-03: qty 222

## 2023-04-03 NOTE — Progress Notes (Signed)
Progress Note   Patient: Cindy Trujillo GMW:[id number] DOB: 21-Nov-1955 DOA: 04/01/2023     2 DOS: the patient was seen and examined on 04/03/2023   Brief hospital course: 67yo with h/o DM, HTN, HLD, and DVT who presented to [facility] on 12/19 with abdominal pain and jaundice.  Bili 16.6.  [place] with nodular liver, trace ascites, ?varices, distended gallbladder with sludge.  CT with cirrhosis and CBD dilatation to 15 mm.  MRCP with CBD dilated to 14 mm, suspected 3 mm CBD stone.  She was transferred to [facility] for ERCP.  Surgery consulted. GI consulting, performed for EGD, ERCP on 12/20 which showed grade 2 esophageal varices with mild portal hypertensive gastropathy.  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  Pathology is pending but she is not a surgical candidate and surgery has signed off.  CA 19-9 is pending.  Assessment and Plan:  RUQ pain and jaundice Patient presenting with liver failure, bili >16 No history of alcoholism or excess use of Tylenol Hepatitis panel negative Imaging indicates liver cirrhosis (NASH) with CBD dilatation, suspicious for stone, mild ascites, ? varices GI is consulting  On PPI EGD/ERCP on 12/21 with stent placement but she appears to have a CBD stricture and/or ampullary tumor as the etiology and so surgery is not indicated CA 19-9 is pending Pathology is pending from the procedure Her bili is slightly higher today; when it is downtrending and she is able to eat/drink, she may be able to go home prior to pathology resulting   Cirrhosis No known h/o liver disease MELD score of 15 is the threshold for a patient survival with transplantation > survival without transplantation; patients should be considered for transplant referral in this circumstance. MELD/MELD-Na score is 27/28, with a mortality rate of 19.6% Child-Pugh  category is B, with an 80% 1-year survival Thrombocytopenia is likely associated with cirrhosis   Stage 3b CKD with acute metabolic acidosis Baseline creatinine 1.42 from October, stable Serum bicarb was significantly low at 15 on presentation, given bicarbonate drip with improvement Renal function is worse today despite ongoing NS at 100 cc/hr Concern for development of hepatorenal syndrome Recheck CMP in AM   H/o right leg DVT Several years ago Completed 6 months course of Eliquis   Type 2 diabetes mellitus A1c 7.5 on October 2024, fair but not optimal control Hold Farxiga, metformin  Will add sensitive-scale SSI   HTN On lisinopril for HTN Marginal BPs on presentation, now improved but not high enough to need to restart lisinopril Can resume when BPs >140/90   HLD Hold aspirin due to procedures Avoid Crestor because of cirrhosis and elevated liver enzymes   Class 2 Obesity Body mass index is 37.2 kg/m.Marland Kitchen  Weight loss should be encouraged Outpatient PCP/bariatric medicine/bariatric surgery f/u encouraged        Consultants: GI Surgery   Procedures: EGD 12/20   Antibiotics: Unasyn x 1  30 Day Unplanned Readmission Risk Score    Flowsheet Row Admission (Current) from 04/01/2023 in [facility]  SURGICAL  30 Day Unplanned Readmission Risk Score (%) 15.27 Filed at 04/03/2023 0800       This score is the patient's risk of an unplanned readmission within 30 days of being discharged (0 -100%). The score is based on dignosis, age, lab data, medications, orders, and past utilization.   Low:  0-14.9  Medium: 15-21.9   High: 22-29.9   Extreme: 30 and above           Subjective: She is having ongoing RUQ pain and is still quite jaundiced.   Objective: Vitals:   04/03/23 0054 04/03/23 0825  BP: (!) 91/55 (!) 93/59  Pulse: 77 67  Resp: 18 18  Temp: 97.9 F (36.6 C) 97.7 F (36.5 C)  SpO2: 96% 96%    Intake/Output Summary (Last  24 hours) at 04/03/2023 1422 Last data filed at 04/02/2023 1551 Gross per 24 hour  Intake 700 ml  Output --  Net 700 ml   Filed Weights   04/01/23 1300  Weight: 98.3 kg    Exam:  General:  Appears calm and comfortable but ill, jaundiced Eyes:  PERRL, EOMI, normal lids, marked scleral icterus ENT:  grossly normal hearing, lips & tongue, mmm Neck:  no LAD, masses or thyromegaly Cardiovascular:  RRR, no m/r/g. No LE edema.  Respiratory:   CTA bilaterally with no wheezes/rales/rhonchi.  Normal respiratory effort. Abdomen:  mild distention, no overt ascites, + RUQ TTP Skin:  frankly jaundiced Musculoskeletal:  grossly normal tone BUE/BLE, good ROM, no bony abnormality Psychiatric:  grossly normal mood and affect, speech fluent and appropriate, AOx3 Neurologic:  CN 2-12 grossly intact, moves all extremities in coordinated fashion  Data Reviewed: I have reviewed the patient's lab results since admission.  Pertinent labs for today include:   CO2 18 Glucose 261 BUN 35/Creatinine 2.06/GFR 26; 22/1.17/52 on 12/20 AP 168 Albumin 2.0 Lipase 156 AST 83/ALT 40/Bili 18.9 WBC 1.9 Hgb 9.9 Platelets 62     Family Communication: None present; she and I spoke with her husband by telephone  Disposition: Status is: Inpatient Remains inpatient appropriate because: ongoing evaluation and treatment     Time spent: 50 minutes  Unresulted Labs (From admission, onward)     Start     Ordered   04/04/23 0500  Basic metabolic panel  Daily,   R     Question:  Specimen collection method  Answer:  Lab=Lab collect   04/03/23 1150   04/04/23 0500  Hepatic function panel  Daily,   R     Question:  Specimen collection method  Answer:  Lab=Lab collect   04/03/23 1150   04/04/23 0500  Protime-INR  Tomorrow morning,   R       Question:  Specimen collection method  Answer:  Lab=Lab collect   04/03/23 1418   04/03/23 0500  CBC  Tomorrow morning,   R       Question:  Specimen collection method   Answer:  Lab=Lab collect   04/02/23 1659   04/03/23 0500  Cancer antigen 19-9  Tomorrow morning,   R       Question:  Specimen collection method  Answer:  Lab=Lab collect   04/02/23 1659             Author: Jonah Blue, MD 04/03/2023 2:22 PM  For on call review [email or web address].

## 2023-04-03 NOTE — Progress Notes (Addendum)
Progress Note  Primary GI: Previously Dr. Manfred Shirts at [facility], Dr. Mia Creek at [facility], unassigned  DOA: 04/01/2023         Hospital Day: 3   Subjective  Chief Complaint: cholelithiasis, choledocholithiasis   No family was present at the time of my evaluation. Patient lying in bed doing ice chips tolerating well without nausea vomiting has very mild right upper quadrant epigastric discomfort that she has had since discharge this is not worsening.  Overall doing well but very anxious about her diagnosis. On ERCP with Dr. Leone Payor with stricture with stent placed and ampullary mass pending biopsies    Objective   Vital signs in last 24 hours: Temp:  [97.7 F (36.5 C)-98.3 F (36.8 C)] 97.7 F (36.5 C) (12/21 0825) Pulse Rate:  [67-83] 67 (12/21 0825) Resp:  [16-24] 18 (12/21 0825) BP: (91-118)/(55-66) 93/59 (12/21 0825) SpO2:  [93 %-99 %] 96 % (12/21 0825) Last BM Date : 03/31/23 Last BM recorded by nurses in past 5 days No data recorded  General: Jaundice female in no acute distress  Heart:  Regular rate and rhythm; no murmurs Pulm: Clear anteriorly; no wheezing Abdomen:  Soft, Obese AB, Active bowel sounds. mild tenderness in the RUQ. Without guarding and Without rebound, No organomegaly appreciated. Extremities:  without  edema. Neurologic:  Alert and  oriented x4;  No focal deficits.  Psych:  Cooperative. Normal mood and affect.  Intake/Output from previous day: 12/20 0701 - 12/21 0700 In: 700 [I.V.:600; IV Piggyback:100] Out: -  Intake/Output this shift: No intake/output data recorded.  Studies/Results: DG ERCP Result Date: 04/02/2023 CLINICAL DATA:  ERCP EXAM: ERCP TECHNIQUE: Multiple spot images obtained with the fluoroscopic device and submitted for interpretation post-procedure. FLUOROSCOPY TIME: FLUOROSCOPY TIME 3 minutes, 50 seconds (85.9 mGy) COMPARISON:  MRCP-03/31/2023 FINDINGS: Five spot fluoroscopic images of the right upper abdominal quadrant during  ERCP are provided for review Initial image demonstrates an ERCP probe overlying the right upper abdominal quadrant Subsequent images demonstrate selective cannulation and opacification of the common bile duct which appears moderately dilated with tapered narrowing at its distal aspect Subsequent images demonstrate placement of a internal biliary stent overlying the distal aspect of the CBD. IMPRESSION: ERCP with biliary stent placement as above. These images were submitted for radiologic interpretation only. Please see the procedural report for the amount of contrast and the fluoroscopy time utilized. Electronically Signed   By: Simonne Come M.D.   On: 04/02/2023 17:54    Lab Results: Recent Labs    04/01/23 1142 04/02/23 0640 04/03/23 0547  WBC 3.1* 2.6* 1.9*  HGB 10.9* 10.3* 9.9*  HCT 33.1* 30.9* 30.4*  PLT 94* 78* 62*   BMET Recent Labs    04/01/23 1142 04/02/23 0640 04/03/23 0547  NA 137 136 137  K 4.3 4.1 4.7  CL 112* 110 109  CO2 17* 19* 18*  GLUCOSE 135* 149* 261*  BUN 27* 22 35*  CREATININE 1.33* 1.17* 2.06*  CALCIUM 8.6* 8.3* 8.4*   LFT Recent Labs    04/01/23 1142 04/02/23 0640 04/03/23 0547  PROT 6.6   < > 6.0*  ALBUMIN 2.4*   < > 2.0*  AST 97*   < > 83*  ALT 47*   < > 40  ALKPHOS 199*   < > 168*  BILITOT 16.4*   < > 18.9*  BILIDIR 9.0*  --   --    < > = values in this interval not displayed.   PT/INR Recent  Labs    04/01/23 1142 04/02/23 0640  LABPROT 16.9* 16.5*  INR 1.4* 1.3*     Scheduled Meds:  diclofenac  100 mg Rectal Once   insulin aspart  0-5 Units Subcutaneous QHS   insulin aspart  0-9 Units Subcutaneous TID WC   pantoprazole (PROTONIX) IV  40 mg Intravenous QAC breakfast   Continuous Infusions:  sodium chloride 100 mL/hr at 04/02/23 1306     Impression/Plan:   Obstructive jaundice 12/20 ERCP/EGD with Dr. Leone Payor showed grade 2 esophageal varices no stigmata of bleeding portal hypertensive gastropathy mild, major papula enlarged  and abnormal raises question of ampullary tumor biopsied, localized biliary stricture ampulla/distal CBD indeterminant cytology sent, stricture treated with stent AST 83 ALT 40  Alkphos 168 TBili 18.9 LFTs coming down nicely, bilirubin went up briefly but this can be expected with the procedure yesterday and hopefully will come down tomorrow Needs follow-up on pathology and cytology Pending CA 19-9 Will advance diet to full liquid diet Further course determined by pathology Continue supportive care with fluids, antiemetics, pain control -CBC, BMP, HFP tomorrow, serial INR's  Cirrhosis likely MASH MELD 3.0: 32 at 04/03/2023  5:47 AM Driven primarily by bilirubin to bilirubin Splenomegaly, portal hypertension, mild recount Sparing buccal vein mild perigastric varices trace ascites Liver biopsy evidence of steatohepatitis EGD with grade 2 varices Consider carvedilol for portal hypertension, if blood pressure can tolerate and start 6.25  Thrombocytopenia  secondary to cirrhosis and splenomegaly   Normocytic anemia. No overt GI bleeding.  04/03/2023  HGB 9.9 MCV 89.9 Platelets 62 04/28/2022 Iron 71 Ferritin 61 B12 1,299 Likely anemia of chronic disease Recent Labs    04/28/22 1347 08/17/22 1531 10/09/22 0929 03/31/23 1257 04/01/23 1142 04/02/23 0640 04/03/23 0547  HGB 12.2 10.2* 11.0* 11.0* 10.9* 10.3* 9.9*    Coagulopathy secondary to cirrhosis.   Lab Results  Component Value Date   INR 1.3 (H) 04/02/2023   INR 1.4 (H) 04/01/2023   INR 1.3 (H) 03/31/2023    DM type II, last dose of Rybelsus was 2 weeks ago   GERD    Principal Problem:   Obstructive jaundice Active Problems:   Class 2 obesity due to excess calories with body mass index (BMI) of 37.0 to 37.9 in adult   T2DM (type 2 diabetes mellitus) (HCC)   Hyperlipidemia associated with type 2 diabetes mellitus (HCC)   Thrombocytopenia (HCC)   Fatty liver disease, nonalcoholic   History of DVT (deep vein  thrombosis)   Choledocholithiasis   Chronic kidney disease, stage 3b (HCC)   Cirrhosis, non-alcoholic (HCC)   Secondary esophageal varices without bleeding (HCC)   Portal hypertensive gastropathy (HCC)   Ampullary stenosis    LOS: 2 days   Doree Albee  04/03/2023, 1:08 PM  Gastroenterology attending:  I have also seen and evaluated the patient.  I agree with the advanced practitioner's note as above.  She is currently off lisinopril for hypertension and carvedilol at low-dose would be a good choice for her given her portal hypertension and it can reduce the progression of liver complications.  I am not sure if this is a benign or malignant process.  It is likely going to take an endoscopic ultrasound later to sort out.  Hopefully the bilirubin will start to come down her stent was in good position so I do expect that to happen there is may be a delayed response, occlusion cholangiogram using the balloon etc. and in the setting of cirrhosis I am not  surprised that the bilirubin increased slightly.  Once we know the stent is working she can go home most likely unless other things come up.  Pruritus is about the same also.  Abdominal pain is less today.  High level medical decision making with potential malignancy, cirrhosis and complications as problems..  The bulk of which was performed by me.  Iva Boop, MD, [facility] See Loretha Stapler on call - gastroenterology for best contact person 04/03/2023 2:19 PM

## 2023-04-03 NOTE — Anesthesia Postprocedure Evaluation (Signed)
Anesthesia Post Note  Patient: Cindy Trujillo  Procedure(s) Performed: ESOPHAGOGASTRODUODENOSCOPY (EGD) WITH PROPOFOL ENDOSCOPIC RETROGRADE CHOLANGIOPANCREATOGRAPHY (ERCP) BILIARY BRUSHING BILIARY STENT PLACEMENT SPHINCTEROTOMY BIOPSY     Patient location during evaluation: Endoscopy Anesthesia Type: General Level of consciousness: awake and alert Pain management: pain level controlled Vital Signs Assessment: post-procedure vital signs reviewed and stable Respiratory status: spontaneous breathing, nonlabored ventilation, respiratory function stable and patient connected to nasal cannula oxygen Cardiovascular status: blood pressure returned to baseline and stable Postop Assessment: no apparent nausea or vomiting Anesthetic complications: no  No notable events documented.  Last Vitals:  Vitals:   04/03/23 0054 04/03/23 0825  BP: (!) 91/55 (!) 93/59  Pulse: 77 67  Resp: 18 18  Temp: 36.6 C 36.5 C  SpO2: 96% 96%    Last Pain:  Vitals:   04/03/23 0900  TempSrc:   PainSc: 0-No pain   Pain Goal: Patients Stated Pain Goal: 3 (04/02/23 0825)                 Ammara Raj L Jalen Daluz

## 2023-04-03 NOTE — Progress Notes (Signed)
ERCP reviewed  Not an operative candidate Will sign off

## 2023-04-03 NOTE — Plan of Care (Signed)

## 2023-04-04 ENCOUNTER — Inpatient Hospital Stay (HOSPITAL_COMMUNITY): Payer: Commercial Managed Care - PPO

## 2023-04-04 ENCOUNTER — Other Ambulatory Visit (HOSPITAL_COMMUNITY): Payer: Commercial Managed Care - PPO

## 2023-04-04 DIAGNOSIS — I8510 Secondary esophageal varices without bleeding: Secondary | ICD-10-CM | POA: Diagnosis not present

## 2023-04-04 DIAGNOSIS — D696 Thrombocytopenia, unspecified: Secondary | ICD-10-CM | POA: Diagnosis not present

## 2023-04-04 DIAGNOSIS — K7460 Unspecified cirrhosis of liver: Secondary | ICD-10-CM | POA: Diagnosis not present

## 2023-04-04 DIAGNOSIS — N1831 Chronic kidney disease, stage 3a: Secondary | ICD-10-CM | POA: Diagnosis not present

## 2023-04-04 DIAGNOSIS — I959 Hypotension, unspecified: Secondary | ICD-10-CM | POA: Diagnosis not present

## 2023-04-04 DIAGNOSIS — D649 Anemia, unspecified: Secondary | ICD-10-CM | POA: Diagnosis not present

## 2023-04-04 DIAGNOSIS — K3189 Other diseases of stomach and duodenum: Secondary | ICD-10-CM | POA: Diagnosis not present

## 2023-04-04 DIAGNOSIS — K766 Portal hypertension: Secondary | ICD-10-CM | POA: Diagnosis not present

## 2023-04-04 DIAGNOSIS — K831 Obstruction of bile duct: Secondary | ICD-10-CM | POA: Diagnosis not present

## 2023-04-04 DIAGNOSIS — N179 Acute kidney failure, unspecified: Secondary | ICD-10-CM | POA: Diagnosis not present

## 2023-04-04 DIAGNOSIS — R112 Nausea with vomiting, unspecified: Secondary | ICD-10-CM | POA: Diagnosis not present

## 2023-04-04 LAB — BASIC METABOLIC PANEL
Anion gap: 12 (ref 5–15)
BUN: 48 mg/dL — ABNORMAL HIGH (ref 8–23)
CO2: 16 mmol/L — ABNORMAL LOW (ref 22–32)
Calcium: 8.5 mg/dL — ABNORMAL LOW (ref 8.9–10.3)
Chloride: 108 mmol/L (ref 98–111)
Creatinine, Ser: 3.13 mg/dL — ABNORMAL HIGH (ref 0.44–1.00)
GFR, Estimated: 16 mL/min — ABNORMAL LOW (ref >60)
Glucose, Bld: 161 mg/dL — ABNORMAL HIGH (ref 70–99)
Potassium: 4.5 mmol/L (ref 3.5–5.1)
Sodium: 136 mmol/L (ref 135–145)

## 2023-04-04 LAB — HEPATIC FUNCTION PANEL
ALT: 40 U/L (ref 0–44)
AST: 82 U/L — ABNORMAL HIGH (ref 15–41)
Albumin: 2.1 g/dL — ABNORMAL LOW (ref 3.5–5.0)
Alkaline Phosphatase: 174 U/L — ABNORMAL HIGH (ref 38–126)
Bilirubin, Direct: 12.6 mg/dL — ABNORMAL HIGH (ref 0.0–0.2)
Indirect Bilirubin: 7.6 mg/dL — ABNORMAL HIGH (ref 0.3–0.9)
Total Bilirubin: 20.2 mg/dL (ref <1.2)
Total Protein: 6.2 g/dL — ABNORMAL LOW (ref 6.5–8.1)

## 2023-04-04 LAB — PROTIME-INR
INR: 1.3 — ABNORMAL HIGH (ref 0.8–1.2)
Prothrombin Time: 16.8 s — ABNORMAL HIGH (ref 11.4–15.2)

## 2023-04-04 LAB — GLUCOSE, CAPILLARY
Glucose-Capillary: 208 mg/dL — ABNORMAL HIGH (ref 70–99)
Glucose-Capillary: 200 mg/dL — ABNORMAL HIGH (ref 70–99)
Glucose-Capillary: 169 mg/dL — ABNORMAL HIGH (ref 70–99)
Glucose-Capillary: 234 mg/dL — ABNORMAL HIGH (ref 70–99)

## 2023-04-04 MED ORDER — INSULIN ASPART 100 UNIT/ML IJ SOLN
0.0000 [IU] | Freq: Three times a day (TID) | INTRAMUSCULAR | Status: DC
  Administered 2023-04-05: 3 [IU] via SUBCUTANEOUS
  Administered 2023-04-05: 2 [IU] via SUBCUTANEOUS
  Administered 2023-04-06: 5 [IU] via SUBCUTANEOUS
  Administered 2023-04-06: 11 [IU] via SUBCUTANEOUS
  Administered 2023-04-06: 5 [IU] via SUBCUTANEOUS
  Administered 2023-04-07: 3 [IU] via SUBCUTANEOUS
  Administered 2023-04-07 (×2): 2 [IU] via SUBCUTANEOUS
  Administered 2023-04-08 (×2): 3 [IU] via SUBCUTANEOUS

## 2023-04-04 MED ORDER — ALBUMIN HUMAN 25 % IV SOLN
50.0000 g | Freq: Two times a day (BID) | INTRAVENOUS | Status: DC
  Administered 2023-04-04 – 2023-04-06 (×4): 50 g via INTRAVENOUS
  Filled 2023-04-04 (×4): qty 200

## 2023-04-04 MED ORDER — ONDANSETRON HCL 4 MG/2ML IJ SOLN
4.0000 mg | Freq: Four times a day (QID) | INTRAMUSCULAR | Status: AC | PRN
  Administered 2023-04-04 – 2023-04-05 (×2): 4 mg via INTRAVENOUS
  Filled 2023-04-04: qty 2

## 2023-04-04 MED ORDER — MIDODRINE HCL 5 MG PO TABS
10.0000 mg | ORAL_TABLET | Freq: Three times a day (TID) | ORAL | Status: DC
  Administered 2023-04-05 – 2023-04-06 (×5): 10 mg via ORAL
  Filled 2023-04-04 (×5): qty 2

## 2023-04-04 NOTE — Consult Note (Signed)
Renal Service Consult Note [facility]  ABGAIL Cindy Trujillo 04/04/2023 Maree Krabbe, MD Requesting Physician: Dr. Ophelia Charter  Reason for Consult: Renal failure HPI: The patient is a 67 y.o. year-old w/ PMH as below who presented to ED at [facility] on 12/18 c/o N/V, loose stools, yellow skin color and weak "all over". Also R sided abd pain. Some confusion also. No hx liver disease, no excessive etoh use. In ED VVS Hb 11, creat 1.42, tbili 16, lipase 398, INR 1.3. CT abd showed cirrhosis, mild biliary dilatation. MRCP showed mild GB sludge and cirrhosis w/ portal hypertension. The pt was transferred to [facility] for ERCP. Creat dropped from admit 1.4 down to 1.1 on 12/20, then bumped up to 2.0 and 3.1 today.  CT abdomen was done w/ contrast on 12/18. BP's have been soft all admission in the 90s- 110s, MAP mostly in the 60- 75 range. We are asked to see for renal failure.   Pt seen in room. Does not have any abd pain, SOB or confusion at this time.   I/O's are +3.9 L and 625 cc UOP since admission. Pt feels okay. No nv/d. Has had swelling in her lower legs for about 2 years. Denies ever needing fluid drained from her stomach.   ROS - denies CP, no joint pain, no HA, no blurry vision, no rash, no diarrhea, no nausea/ vomiting, no dysuria, no difficulty voiding   Past Medical History  Past Medical History:  Diagnosis Date   Anemia    Bell's palsy    Diabetes mellitus without complication (HCC)    DVT (deep venous thrombosis) (HCC) 08/2018   right leg   GERD (gastroesophageal reflux disease)    Hyperlipidemia    Post-menopausal bleeding 09/21/2019   Past Surgical History  Past Surgical History:  Procedure Laterality Date   COLONOSCOPY WITH PROPOFOL N/A 08/23/2017   Procedure: COLONOSCOPY WITH PROPOFOL;  Surgeon: Scot Jun, MD;  Location: [facility] ENDOSCOPY;  Service: Endoscopy;  Laterality: N/A;   ENDOVENOUS ABLATION SAPHENOUS VEIN W/ LASER Left 02/29/2020   endovenous laser  ablation left greater saphenous vein and stab phlebectomy 10-20 incisions left leg by Cari Caraway MD    HYSTEROSCOPY WITH D & C N/A 10/31/2019   Procedure: DILATATION AND CURETTAGE /HYSTEROSCOPY WITH MYOSURE RESECTION;  Surgeon: Natale Milch, MD;  Location: [facility] ORS;  Service: Gynecology;  Laterality: N/A;   ROBOTIC ASSISTED TOTAL HYSTERECTOMY WITH BILATERAL SALPINGO OOPHERECTOMY N/A 12/06/2019   Procedure: XI ROBOTIC ASSISTED TOTAL HYSTERECTOMY WITH BILATERAL SALPINGO OOPHORECTOMY;  Surgeon: Artelia Laroche, MD;  Location: [facility] ORS;  Service: Gynecology;  Laterality: N/A;   SENTINEL NODE BIOPSY N/A 12/06/2019   Procedure: SENTINEL NODE BIOPSY;  Surgeon: Artelia Laroche, MD;  Location: [facility] ORS;  Service: Gynecology;  Laterality: N/A;   TONSILLECTOMY     TONSILLECTOMY AND ADENOIDECTOMY  1968   VEIN SURGERY  05/2019   laser   Family History  Family History  Problem Relation Age of Onset   COPD Mother        heavy smoker   Pulmonary fibrosis Mother    Fibromyalgia Mother    Cancer Father    COPD Father    Melanoma Father    COPD Sister    Pulmonary fibrosis Sister    Pulmonary fibrosis Sister    COPD Sister    Pulmonary fibrosis Sister    COPD Sister    COPD Brother    Healthy Brother    Heart failure Maternal  Grandmother    Heart attack Maternal Grandfather    Cancer Paternal Grandmother        unsure of type, had mets in colon   Heart attack Paternal Grandfather    Cancer Paternal Grandfather        unknown   Breast cancer Neg Hx    Cervical cancer Neg Hx    Social History  reports that she has never smoked. She has never used smokeless tobacco. She reports that she does not drink alcohol and does not use drugs. Allergies  Allergies  Allergen Reactions   Sulfa Antibiotics Hives   Home medications Prior to Admission medications   Medication Sig Start Date End Date Taking? Authorizing Provider  aspirin EC 81 MG tablet Take 1 tablet (81 mg total)  by mouth daily. Swallow whole. 11/15/20  Yes Rickard Patience, MD  dapagliflozin propanediol (FARXIGA) 10 MG TABS tablet Take 1 tablet (10 mg total) by mouth daily. 10/09/22  Yes Bacigalupo, Marzella Schlein, MD  ferrous sulfate 325 (65 FE) MG tablet TAKE 1 TABLET BY MOUTH TWICE DAILY Patient taking differently: Take 325 mg by mouth daily. 07/04/20 04/01/23 Yes Rickard Patience, MD  guaiFENesin-codeine 100-10 MG/5ML syrup Take 10 mLs by mouth 3 (three) times daily as needed for cough. 02/23/23  Yes Bacigalupo, Marzella Schlein, MD  guaiFENesin-dextromethorphan (ROBITUSSIN DM) 100-10 MG/5ML syrup Take 5 mLs by mouth every 4 (four) hours as needed for cough.   Yes [provider]  lisinopril (ZESTRIL) 5 MG tablet Take 1 tablet (5 mg total) by mouth daily. 10/09/22 10/09/23 Yes Bacigalupo, Marzella Schlein, MD  Magnesium 400 MG TABS Take 1 tablet by mouth 2 (two) times daily.   Yes [provider]  metFORMIN (GLUCOPHAGE) 1000 MG tablet Take 1 tablet (1,000 mg total) by mouth 2 (two) times daily with a meal. 10/09/22 10/09/23 Yes Bacigalupo, Marzella Schlein, MD  omeprazole (PRILOSEC) 20 MG capsule Take 1 capsule (20 mg total) by mouth daily. 04/16/22  Yes Bacigalupo, Marzella Schlein, MD  rosuvastatin (CRESTOR) 5 MG tablet Take 1 tablet (5 mg total) by mouth daily. 10/09/22  Yes Bacigalupo, Marzella Schlein, MD  Semaglutide (RYBELSUS) 3 MG TABS Take 1 tablet (3 mg total) by mouth daily. Patient not taking: Reported on 02/23/2023 01/21/23   Erasmo Downer, MD  vitamin B-12 (CYANOCOBALAMIN) 1000 MCG tablet Take 1 tablet (1,000 mcg total) by mouth daily. 03/21/19  Yes Rickard Patience, MD  VITAMIN E PO Take 1 tablet by mouth daily.   Yes [provider]  fluconazole (DIFLUCAN) 100 MG tablet Take 1 tablet (100 mg total) by mouth daily. Patient not taking: Reported on 04/01/2023 03/05/23   Sherlyn Hay, DO  FreeStyle Unistick II Lancets MISC Use as instructed to check blood glucose daily 06/26/19   Erasmo Downer, MD  glucose blood (FREESTYLE TEST  STRIPS) test strip Use as instructed to check blood glucose daily 04/16/22   Erasmo Downer, MD  Vitamin D, Ergocalciferol, (DRISDOL) 1.25 MG (50000 UNIT) CAPS capsule Take 1 capsule (50,000 Units total) by mouth every 7 (seven) days. Patient not taking: Reported on 04/01/2023 05/08/22   Erasmo Downer, MD     Vitals:   04/04/23 0341 04/04/23 0826 04/04/23 1705 04/04/23 1951  BP: (!) 116/57 (!) 120/54 (!) 114/57 (!) 107/55  Pulse: 69 74 84 76  Resp: 17 15 17 14   Temp: 98.2 F (36.8 C) 98.4 F (36.9 C) 97.9 F (36.6 C) 98.6 F (37 C)  TempSrc: Oral Oral Oral  Oral  SpO2: 97% 97% 98% 97%  Weight:      Height:       Exam Gen alert, no distress No rash, cyanosis or gangrene Sclera anicteric, throat clear  No jvd or bruits Chest clear bilat to bases, no rales/ wheezing RRR no MRG Abd soft ntnd obese no mass or ascites +bs GU defer MS no joint effusions or deformity Ext 1-2+ bilat pretib edema, no wounds or ulcers Neuro is alert, Ox 3 , nf      Renal-related home meds: - farxiga - lisinopril 5 qd - others: farxiga/ metformin/ semaglutide, statin, PPI, asa   Date   Creat  eGFR (ml/min)  2018- 2019  0.81- 0.91 69- 79  2020   1.03- 1.18   2021   1.12- 1.19    2022   1.01- 1.24  Feb, june 2022 1.01-1.26 Apr 2022  1.13- 1.27 September 2022  1.17  52   Oct 2024  1.42  41   12/18   1.42   12/19   1.33    12/20   1.17   12/21   2.06   04/04/23  3.13    IV CT contrast iodinated given 100 cc on 12/18 w/ CT scan of abd/ pelvis    UA 12/18 - small bili, amber, glu >500, neg protein, 0-5 rbc/ wbc/ epis     CT abd/ pelvis: Adrenals/Urinary Tract: Adrenal glands are within normal limits. Left kidney is within normal limits. Punctate nonobstructing right upper pole renal calculus (series 2/image 28). No hydronephrosis.     Na 136   K 4.5  CO2 16   bun 48  creat 3.13  CA 8.5   alb 2.1  AST 82  Tbili 20.2     ALT 40  wBC 1.9   Hb 9.9  plt 62k     Assessment/ Plan: AKK on  CKD 3a - b/l creatinine 1.1- 1.4 from June- oct 2024, eGFR 41- 52 ml/min. Creatinine here was 1.4 on admission in the setting of decompensated cirrhosis. Creat 1st  improved to down 1.17 and then bumped up to 2 yesterday and 3.1 today. UA w/o rbcs/ wbcs, no protein. Urine lytes are pending. UOP is not very high, not sure all being recorded. CT abd 12/18 showed no hydro, normal appearing kidneys. Pt got IV contrast w/ CT on 12/18 but creat bump was 3 days later so unlikely to be related. She does has significant signs of portal HTN and her tbili is 20. Suspect bilirubin nephropathy (ATN, supportive care) and/ or hepatorenal syndrome. Will order UNa and UCr. Will empirically start w/ IV albumin and po midodrine. Can add octreotide if needed. Will dc IVF"s, typically not helpful for HRS.  Will follow.  Decompensated cirrhosis - GI seeing LE edema - mild vol overload, chest is clear on exam, on RA HTN - HTN meds have been held due to her soft BP's. Cont to hold acei in setting of AKI.  DM2 Pancytopenia       Vinson Moselle  MD [facility] 04/04/2023, 8:24 PM  Recent Labs  Lab 04/02/23 0640 04/03/23 0547 04/04/23 0623  HGB 10.3* 9.9*  --   ALBUMIN 2.1* 2.0* 2.1*  CALCIUM 8.3* 8.4* 8.5*  CREATININE 1.17* 2.06* 3.13*  K 4.1 4.7 4.5   Inpatient medications:  diclofenac  100 mg Rectal Once   insulin aspart  0-15 Units Subcutaneous TID WC   insulin aspart  0-5 Units Subcutaneous QHS   pantoprazole (  PROTONIX) IV  40 mg Intravenous QAC breakfast    sodium chloride 100 mL/hr at 04/04/23 0656   acetaminophen **OR** acetaminophen, albuterol, bisacodyl, camphor-menthol, hydrALAZINE, HYDROmorphone (DILAUDID) injection, mineral oil-hydrophilic petrolatum, oxyCODONE, polyethylene glycol

## 2023-04-04 NOTE — H&P (View-Only) (Signed)
Patient Name: Cindy Trujillo Date of Encounter: 04/04/2023, 9:38 AM     Assessment and Plan  Obstructive jaundice 12/20 ERCP/EGD with Dr. Leone Payor showed grade 2 esophageal varices no stigmata of bleeding portal hypertensive gastropathy mild, major papula enlarged and abnormal raises question of ampullary tumor biopsied, localized biliary stricture ampulla/distal CBD indeterminant cytology sent, stricture treated with stent Bilirubin greater than 20 today, it was 18.9 yesterday.  This is concerning for malfunction of the stent, versus stent movement, question contribution of acute kidney injury and bilirubin clearance (doubt) though I am concerned about stent malfunction.  She is cirrhotic and that could impact things as well.  Pruritus persists.  Question is there an intrinsic liver issue as well though would need to be certain biliary obstruction is treated. Needs follow-up on pathology and cytology Pending CA 19-9 -CBC, BMP, HFP tomorrow, serial INR's  Will get KUB to check stent placement. N.p.o. after midnight as she could need repeat ERCP tomorrow depending upon bilirubin and KUB result.  We have not thought she had cholecystitis and I still think that is unlikely but need to keep that in mind.    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   Thrombocytopenia  secondary to cirrhosis and splenomegaly   Acute kidney injury with decreased urine output Nephrology has been consulted per [facility] hospitalist  Normocytic anemia. No overt GI bleeding.  04/03/2023  HGB 9.9 MCV 89.9 Platelets 62 04/28/2022 Iron 71 Ferritin 61 B12 1,299 Likely anemia of chronic disease Recent Labs (within last 365 days)           Recent Labs     04/28/22 1347 08/17/22 1531 10/09/22 0929 03/31/23 1257 04/01/23 1142 04/02/23 0640 04/03/23 0547  HGB 12.2 10.2* 11.0* 11.0* 10.9* 10.3* 9.9*        Coagulopathy secondary to cirrhosis.   Recent Labs       Lab Results  Component Value Date    INR 1.3 (H) 04/02/2023    INR 1.4 (H) 04/01/2023    INR 1.3 (H) 03/31/2023        DM type II, last dose of Rybelsus was 2 weeks ago   GERD   Medical decision making is high complexity   Subjective  Complaining of nausea.  She has noticed decreased urine output.  Urine output not being measured by staff consistently.  Emollient cream is helping with pruritus but that persists.   Objective  BP (!) 120/54 (BP Location: Right Arm)   Pulse 74   Temp 98.4 F (36.9 C) (Oral)   Resp 15   Ht 5\' 4"  (1.626 m)   Wt 98.3 kg   SpO2 97%   BMI 37.20 kg/m  Obese jaundiced white woman in no acute distress Lungs are clear Heart sounds are normal S1-S2 no rubs murmurs or gallops The abdomen is obese soft and mildly tender in the right upper quadrant  I/O last 3 completed shifts: In: -  Out: 100 [Urine:100] Total I/O In: 1200 [I.V.:1200] Out: -    Recent Labs  Lab 03/31/23 1257 03/31/23 1924 04/01/23 1142 04/02/23 0640 04/03/23 0547 04/04/23 0623  AST 105*  --  97* 94* 83* 82*  ALT 49*  --  47* 38 40 40  ALKPHOS 232*  --  199* 187* 168* 174*  BILITOT  16.6*  --  16.4* 16.7* 18.9* 20.2*  PROT 7.3  --  6.6 6.1* 6.0* 6.2*  ALBUMIN 2.9*  --  2.4* 2.1* 2.0* 2.1*  INR  --  1.3* 1.4* 1.3*  --  1.3*   Recent Labs  Lab 03/31/23 1257 04/01/23 1142 04/02/23 0640 04/03/23 0547 04/04/23 0623  NA 134* 137 136 137 136  K 4.3 4.3 4.1 4.7 4.5  CL 106 112* 110 109 108  CO2 15* 17* 19* 18* 16*  GLUCOSE 218* 135* 149* 261* 161*  BUN 30* 27* 22 35* 48*  CREATININE 1.42* 1.33* 1.17* 2.06* 3.13*  CALCIUM 8.9 8.6* 8.3* 8.4* 8.5*   Recent Labs  Lab 04/01/23 1142 04/02/23 0640 04/03/23 0547  HGB 10.9* 10.3* 9.9*  HCT 33.1* 30.9*  30.4*  WBC 3.1* 2.6* 1.9*  PLT 94* 78* 62*   Lab Results  Component Value Date   LIPASE 156 (H) 04/03/2023     Iva Boop, MD, [facility] See AMION on call - gastroenterology for best contact person 04/04/2023 9:38 AM

## 2023-04-04 NOTE — Plan of Care (Signed)

## 2023-04-04 NOTE — Progress Notes (Signed)
Progress Note   Patient: Cindy Trujillo WUX:[id number] DOB: 22-Jan-1956 DOA: 04/01/2023     3 DOS: the patient was seen and examined on 04/04/2023   Brief hospital course: 67yo with h/o DM, HTN, HLD, and DVT who presented to [facility] on 12/19 with abdominal pain and jaundice.  Bili 16.6.  [place] with nodular liver, trace ascites, ?varices, distended gallbladder with sludge.  CT with cirrhosis and CBD dilatation to 15 mm.  MRCP with CBD dilated to 14 mm, suspected 3 mm CBD stone.  She was transferred to [facility] for ERCP.  Surgery consulted. GI consulting, performed for EGD, ERCP on 12/20 which showed grade 2 esophageal varices with mild portal hypertensive gastropathy.  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  Pathology is pending but she is not a surgical candidate and surgery has signed off.  CA 19-9 is pending.  Assessment and Plan:  RUQ pain and jaundice Patient presenting with liver failure, bili >16 -> 20 No history of alcoholism or excess use of Tylenol Hepatitis panel negative Imaging indicates liver cirrhosis (NASH) with CBD dilatation, suspicious for stone, mild ascites, ? 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 Baseline creatinine 1.42 from October, stable Serum bicarb was significantly low at 15 on presentation, given bicarbonate drip with improvement Renal function is continuing to worsen despite ongoing NS at 100 cc/hr Concern for development of hepatorenal syndrome Recheck CMP in AM Nephrology consulted Renal US pending   H/o right leg DVT Several years ago Completed 6 months course of Eliquis   Type 2 diabetes mellitus A1c 7.5 on October 2024, fair but not optimal control Hold Farxiga, metformin  Will add modeate-scale SSI   HTN On lisinopril for HTN Marginal BPs on presentation, now improved but not high enough to need to restart lisinopril Can resume when BPs >140/90   HLD Hold aspirin due to procedures Avoid Crestor because of cirrhosis and elevated liver enzymes   Class 2 Obesity Body mass index is 37.2 kg/m.Marland Kitchen  Weight loss should be encouraged Outpatient PCP/bariatric medicine/bariatric surgery f/u encouraged        Consultants: GI Surgery Nephrology   Procedures: EGD/ERCP 12/20 ERCP 12/23 planned   Antibiotics: Unasyn x 1    30 Day Unplanned Readmission Risk Score    Flowsheet Row Admission (Current) from 04/01/2023 in [facility]  SURGICAL  30 Day Unplanned Readmission Risk Score (%) 15.79 Filed at 04/04/2023 0401       This score is the patient's  risk of an unplanned readmission within 30 days of being discharged (0 -100%). The score is based on dignosis, age, lab data, medications, orders, and past utilization.   Low:  0-14.9   Medium: 15-21.9   High: 22-29.9   Extreme: 30 and above           Subjective: She feels weak, tired, scared.   Objective: Vitals:   04/03/23 2127  04/04/23 0341  BP: (!) 110/56 (!) 116/57  Pulse: 68 69  Resp: 18 17  Temp: 97.6 F (36.4 C) 98.2 F (36.8 C)  SpO2: 98% 97%    Intake/Output Summary (Last 24 hours) at 04/04/2023 0756 Last data filed at 04/04/2023 2130 Gross per 24 hour  Intake --  Output 100 ml  Net -100 ml   Filed Weights   04/01/23 1300  Weight: 98.3 kg    Exam:  General:  Appears calm and comfortable but ill, jaundiced Eyes:  PERRL, EOMI, normal lids, marked scleral icterus ENT:  grossly normal hearing, lips & tongue, mmm Neck:  no LAD, masses or thyromegaly Cardiovascular:  RRR, no m/r/g. No LE edema.  Respiratory:   CTA bilaterally with no wheezes/rales/rhonchi.  Normal respiratory effort. Abdomen:  mild distention, no overt ascites, + RUQ TTP Skin:  frankly jaundiced Musculoskeletal:  grossly normal tone BUE/BLE, good ROM, no bony abnormality Psychiatric:  grossly normal mood and affect, speech fluent and appropriate, AOx3 Neurologic:  CN 2-12 grossly intact, moves all extremities in coordinated fashion  Data Reviewed: I have reviewed the patient's lab results since admission.  Pertinent labs for today include:  CO2 16 Glucose 161 BUN 48/Creatinine 3.13/GFR 16 - worsening AP 174 Albumin 2.1 AST 82/ALT 40 - stable Bili 20.2 - worsening INR 1.3     Family Communication: Husband was present throughout evaluation  Disposition: Status is: Inpatient Remains inpatient appropriate because: seriously ill     Time spent: 50 minutes  Unresulted Labs (From admission, onward)     Start     Ordered   04/04/23 0500  Basic metabolic panel  Daily,   R     Question:  Specimen collection method  Answer:  Lab=Lab collect   04/03/23 1150   04/04/23 0500  Hepatic function panel  Daily,   R     Question:  Specimen collection method  Answer:  Lab=Lab collect   04/03/23 1150   04/03/23 0500  CBC  Tomorrow morning,   R       Question:  Specimen collection method  Answer:  Lab=Lab collect    04/02/23 1659   04/03/23 0500  Cancer antigen 19-9  Tomorrow morning,   R       Question:  Specimen collection method  Answer:  Lab=Lab collect   04/02/23 1659             Author: Jonah Blue, MD 04/04/2023 7:56 AM  For on call review [email or web address].

## 2023-04-04 NOTE — Progress Notes (Signed)
Patient Name: Cindy Trujillo Date of Encounter: 04/04/2023, 9:38 AM     Assessment and Plan  Obstructive jaundice 12/20 ERCP/EGD with Dr. Leone Payor showed grade 2 esophageal varices no stigmata of bleeding portal hypertensive gastropathy mild, major papula enlarged and abnormal raises question of ampullary tumor biopsied, localized biliary stricture ampulla/distal CBD indeterminant cytology sent, stricture treated with stent Bilirubin greater than 20 today, it was 18.9 yesterday.  This is concerning for malfunction of the stent, versus stent movement, question contribution of acute kidney injury and bilirubin clearance (doubt) though I am concerned about stent malfunction.  She is cirrhotic and that could impact things as well.  Pruritus persists.  Question is there an intrinsic liver issue as well though would need to be certain biliary obstruction is treated. Needs follow-up on pathology and cytology Pending CA 19-9 -CBC, BMP, HFP tomorrow, serial INR's  Will get KUB to check stent placement. N.p.o. after midnight as she could need repeat ERCP tomorrow depending upon bilirubin and KUB result.  We have not thought she had cholecystitis and I still think that is unlikely but need to keep that in mind.    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   Thrombocytopenia  secondary to cirrhosis and splenomegaly   Acute kidney injury with decreased urine output Nephrology has been consulted per [facility] hospitalist  Normocytic anemia. No overt GI bleeding.  04/03/2023  HGB 9.9 MCV 89.9 Platelets 62 04/28/2022 Iron 71 Ferritin 61 B12 1,299 Likely anemia of chronic disease Recent Labs (within last 365 days)           Recent Labs     04/28/22 1347 08/17/22 1531 10/09/22 0929 03/31/23 1257 04/01/23 1142 04/02/23 0640 04/03/23 0547  HGB 12.2 10.2* 11.0* 11.0* 10.9* 10.3* 9.9*        Coagulopathy secondary to cirrhosis.   Recent Labs       Lab Results  Component Value Date    INR 1.3 (H) 04/02/2023    INR 1.4 (H) 04/01/2023    INR 1.3 (H) 03/31/2023        DM type II, last dose of Rybelsus was 2 weeks ago   GERD   Medical decision making is high complexity   Subjective  Complaining of nausea.  She has noticed decreased urine output.  Urine output not being measured by staff consistently.  Emollient cream is helping with pruritus but that persists.   Objective  BP (!) 120/54 (BP Location: Right Arm)   Pulse 74   Temp 98.4 F (36.9 C) (Oral)   Resp 15   Ht 5\' 4"  (1.626 m)   Wt 98.3 kg   SpO2 97%   BMI 37.20 kg/m  Obese jaundiced white woman in no acute distress Lungs are clear Heart sounds are normal S1-S2 no rubs murmurs or gallops The abdomen is obese soft and mildly tender in the right upper quadrant  I/O last 3 completed shifts: In: -  Out: 100 [Urine:100] Total I/O In: 1200 [I.V.:1200] Out: -    Recent Labs  Lab 03/31/23 1257 03/31/23 1924 04/01/23 1142 04/02/23 0640 04/03/23 0547 04/04/23 0623  AST 105*  --  97* 94* 83* 82*  ALT 49*  --  47* 38 40 40  ALKPHOS 232*  --  199* 187* 168* 174*  BILITOT  16.6*  --  16.4* 16.7* 18.9* 20.2*  PROT 7.3  --  6.6 6.1* 6.0* 6.2*  ALBUMIN 2.9*  --  2.4* 2.1* 2.0* 2.1*  INR  --  1.3* 1.4* 1.3*  --  1.3*   Recent Labs  Lab 03/31/23 1257 04/01/23 1142 04/02/23 0640 04/03/23 0547 04/04/23 0623  NA 134* 137 136 137 136  K 4.3 4.3 4.1 4.7 4.5  CL 106 112* 110 109 108  CO2 15* 17* 19* 18* 16*  GLUCOSE 218* 135* 149* 261* 161*  BUN 30* 27* 22 35* 48*  CREATININE 1.42* 1.33* 1.17* 2.06* 3.13*  CALCIUM 8.9 8.6* 8.3* 8.4* 8.5*   Recent Labs  Lab 04/01/23 1142 04/02/23 0640 04/03/23 0547  HGB 10.9* 10.3* 9.9*  HCT 33.1* 30.9*  30.4*  WBC 3.1* 2.6* 1.9*  PLT 94* 78* 62*   Lab Results  Component Value Date   LIPASE 156 (H) 04/03/2023     Iva Boop, MD, [facility] See AMION on call - gastroenterology for best contact person 04/04/2023 9:38 AM

## 2023-04-05 ENCOUNTER — Inpatient Hospital Stay (HOSPITAL_COMMUNITY): Payer: Commercial Managed Care - PPO | Admitting: Certified Registered Nurse Anesthetist

## 2023-04-05 ENCOUNTER — Inpatient Hospital Stay (HOSPITAL_COMMUNITY): Payer: Commercial Managed Care - PPO

## 2023-04-05 ENCOUNTER — Encounter (HOSPITAL_COMMUNITY): Payer: Self-pay | Admitting: Internal Medicine

## 2023-04-05 ENCOUNTER — Encounter (HOSPITAL_COMMUNITY)
Admission: AD | Disposition: A | Discharge status: Other Acute Inpatient Hospital | Payer: Self-pay | Source: Other Acute Inpatient Hospital | Attending: Internal Medicine

## 2023-04-05 DIAGNOSIS — E66812 Obesity, class 2: Secondary | ICD-10-CM | POA: Diagnosis not present

## 2023-04-05 DIAGNOSIS — N183 Chronic kidney disease, stage 3 unspecified: Secondary | ICD-10-CM | POA: Diagnosis not present

## 2023-04-05 DIAGNOSIS — E6609 Other obesity due to excess calories: Secondary | ICD-10-CM

## 2023-04-05 DIAGNOSIS — K838 Other specified diseases of biliary tract: Secondary | ICD-10-CM | POA: Diagnosis not present

## 2023-04-05 DIAGNOSIS — N1832 Chronic kidney disease, stage 3b: Secondary | ICD-10-CM | POA: Diagnosis not present

## 2023-04-05 DIAGNOSIS — K8051 Calculus of bile duct without cholangitis or cholecystitis with obstruction: Secondary | ICD-10-CM | POA: Diagnosis not present

## 2023-04-05 DIAGNOSIS — Z6837 Body mass index (BMI) 37.0-37.9, adult: Secondary | ICD-10-CM

## 2023-04-05 DIAGNOSIS — I10 Essential (primary) hypertension: Secondary | ICD-10-CM | POA: Diagnosis not present

## 2023-04-05 DIAGNOSIS — N179 Acute kidney failure, unspecified: Secondary | ICD-10-CM | POA: Diagnosis not present

## 2023-04-05 DIAGNOSIS — I8500 Esophageal varices without bleeding: Secondary | ICD-10-CM

## 2023-04-05 DIAGNOSIS — K760 Fatty (change of) liver, not elsewhere classified: Secondary | ICD-10-CM | POA: Diagnosis not present

## 2023-04-05 DIAGNOSIS — K766 Portal hypertension: Secondary | ICD-10-CM | POA: Diagnosis not present

## 2023-04-05 DIAGNOSIS — K3189 Other diseases of stomach and duodenum: Secondary | ICD-10-CM | POA: Diagnosis not present

## 2023-04-05 DIAGNOSIS — R932 Abnormal findings on diagnostic imaging of liver and biliary tract: Secondary | ICD-10-CM

## 2023-04-05 DIAGNOSIS — E1169 Type 2 diabetes mellitus with other specified complication: Secondary | ICD-10-CM | POA: Diagnosis not present

## 2023-04-05 DIAGNOSIS — K831 Obstruction of bile duct: Secondary | ICD-10-CM | POA: Diagnosis not present

## 2023-04-05 DIAGNOSIS — Z86718 Personal history of other venous thrombosis and embolism: Secondary | ICD-10-CM

## 2023-04-05 DIAGNOSIS — D696 Thrombocytopenia, unspecified: Secondary | ICD-10-CM | POA: Diagnosis not present

## 2023-04-05 DIAGNOSIS — E785 Hyperlipidemia, unspecified: Secondary | ICD-10-CM

## 2023-04-05 DIAGNOSIS — K7460 Unspecified cirrhosis of liver: Secondary | ICD-10-CM | POA: Diagnosis not present

## 2023-04-05 HISTORY — PX: ENDOSCOPIC RETROGRADE CHOLANGIOPANCREATOGRAPHY (ERCP) WITH PROPOFOL: SHX5810

## 2023-04-05 HISTORY — PX: STENT REMOVAL: SHX6421

## 2023-04-05 HISTORY — PX: BILIARY STENT PLACEMENT: SHX5538

## 2023-04-05 LAB — CANCER ANTIGEN 19-9: CA 19-9: 232 U/mL — ABNORMAL HIGH (ref 0–35)

## 2023-04-05 LAB — HEPATIC FUNCTION PANEL
ALT: 32 U/L (ref 0–44)
AST: 74 U/L — ABNORMAL HIGH (ref 15–41)
Albumin: 2.6 g/dL — ABNORMAL LOW (ref 3.5–5.0)
Alkaline Phosphatase: 146 U/L — ABNORMAL HIGH (ref 38–126)
Bilirubin, Direct: 14.1 mg/dL — ABNORMAL HIGH (ref 0.0–0.2)
Indirect Bilirubin: 7.2 mg/dL — ABNORMAL HIGH (ref 0.3–0.9)
Total Bilirubin: 21.3 mg/dL (ref <1.2)
Total Protein: 5.9 g/dL — ABNORMAL LOW (ref 6.5–8.1)

## 2023-04-05 LAB — CBC
HCT: 28.5 % — ABNORMAL LOW (ref 36.0–46.0)
Hemoglobin: 9.4 g/dL — ABNORMAL LOW (ref 12.0–15.0)
MCH: 29.7 pg (ref 26.0–34.0)
MCHC: 33 g/dL (ref 30.0–36.0)
MCV: 90.2 fL (ref 80.0–100.0)
Platelets: 67 10*3/uL — ABNORMAL LOW (ref 150–400)
RBC: 3.16 MIL/uL — ABNORMAL LOW (ref 3.87–5.11)
RDW: 17.9 % — ABNORMAL HIGH (ref 11.5–15.5)
WBC: 3.4 10*3/uL — ABNORMAL LOW (ref 4.0–10.5)
nRBC: 0 % (ref 0.0–0.2)

## 2023-04-05 LAB — SODIUM, URINE, RANDOM: Sodium, Ur: 41 mmol/L

## 2023-04-05 LAB — BASIC METABOLIC PANEL
Anion gap: 10 (ref 5–15)
BUN: 51 mg/dL — ABNORMAL HIGH (ref 8–23)
CO2: 16 mmol/L — ABNORMAL LOW (ref 22–32)
Calcium: 8.6 mg/dL — ABNORMAL LOW (ref 8.9–10.3)
Chloride: 109 mmol/L (ref 98–111)
Creatinine, Ser: 3.18 mg/dL — ABNORMAL HIGH (ref 0.44–1.00)
GFR, Estimated: 16 mL/min — ABNORMAL LOW (ref >60)
Glucose, Bld: 167 mg/dL — ABNORMAL HIGH (ref 70–99)
Potassium: 4.1 mmol/L (ref 3.5–5.1)
Sodium: 135 mmol/L (ref 135–145)

## 2023-04-05 LAB — PROTIME-INR
INR: 1.5 — ABNORMAL HIGH (ref 0.8–1.2)
Prothrombin Time: 17.9 s — ABNORMAL HIGH (ref 11.4–15.2)

## 2023-04-05 LAB — GLUCOSE, CAPILLARY
Glucose-Capillary: 148 mg/dL — ABNORMAL HIGH (ref 70–99)
Glucose-Capillary: 110 mg/dL — ABNORMAL HIGH (ref 70–99)
Glucose-Capillary: 171 mg/dL — ABNORMAL HIGH (ref 70–99)
Glucose-Capillary: 257 mg/dL — ABNORMAL HIGH (ref 70–99)

## 2023-04-05 LAB — CREATININE, URINE, RANDOM: Creatinine, Urine: 111 mg/dL

## 2023-04-05 SURGERY — ENDOSCOPIC RETROGRADE CHOLANGIOPANCREATOGRAPHY (ERCP) WITH PROPOFOL
Anesthesia: General

## 2023-04-05 MED ORDER — DICLOFENAC SUPPOSITORY 100 MG
RECTAL | Status: AC
  Filled 2023-04-05: qty 1

## 2023-04-05 MED ORDER — SODIUM CHLORIDE 0.9 % IV SOLN
INTRAVENOUS | Status: DC | PRN
  Administered 2023-04-05: 15 mL

## 2023-04-05 MED ORDER — PROPOFOL 10 MG/ML IV BOLUS
INTRAVENOUS | Status: DC | PRN
  Administered 2023-04-05: 150 mg via INTRAVENOUS
  Administered 2023-04-05: 50 mg via INTRAVENOUS

## 2023-04-05 MED ORDER — INDOMETHACIN 50 MG RE SUPP: 100.0000 mg | Freq: Once | RECTAL | Status: DC

## 2023-04-05 MED ORDER — ROCURONIUM BROMIDE 100 MG/10ML IV SOLN
INTRAVENOUS | Status: DC | PRN
  Administered 2023-04-05: 50 mg via INTRAVENOUS

## 2023-04-05 MED ORDER — GLUCAGON HCL RDNA (DIAGNOSTIC) 1 MG IJ SOLR
INTRAMUSCULAR | Status: AC
  Filled 2023-04-05: qty 1

## 2023-04-05 MED ORDER — GLUCAGON HCL RDNA (DIAGNOSTIC) 1 MG IJ SOLR
INTRAMUSCULAR | Status: DC | PRN
  Administered 2023-04-05 (×2): .25 mg via INTRAVENOUS

## 2023-04-05 MED ORDER — FENTANYL CITRATE (PF) 100 MCG/2ML IJ SOLN
INTRAMUSCULAR | Status: AC
  Filled 2023-04-05: qty 2

## 2023-04-05 MED ORDER — FENTANYL CITRATE (PF) 100 MCG/2ML IJ SOLN
INTRAMUSCULAR | Status: DC | PRN
  Administered 2023-04-05: 50 ug via INTRAVENOUS

## 2023-04-05 MED ORDER — SUGAMMADEX SODIUM 200 MG/2ML IV SOLN
INTRAVENOUS | Status: DC | PRN
  Administered 2023-04-05: 198.4 mg via INTRAVENOUS

## 2023-04-05 MED ORDER — DICLOFENAC SUPPOSITORY 100 MG
RECTAL | Status: DC | PRN
  Administered 2023-04-05: 100 mg via RECTAL

## 2023-04-05 MED ORDER — SODIUM CHLORIDE 0.9 % IV SOLN: INTRAVENOUS | Status: DC

## 2023-04-05 MED ORDER — DIPHENHYDRAMINE HCL 12.5 MG/5ML PO ELIX
6.2500 mg | ORAL_SOLUTION | Freq: Four times a day (QID) | ORAL | Status: DC | PRN
  Administered 2023-04-05 (×2): 6.25 mg via ORAL
  Filled 2023-04-05 (×2): qty 10

## 2023-04-05 MED ORDER — PHENYLEPHRINE HCL-NACL 20-0.9 MG/250ML-% IV SOLN
INTRAVENOUS | Status: DC | PRN
  Administered 2023-04-05: 25 ug/min via INTRAVENOUS

## 2023-04-05 MED ORDER — DEXAMETHASONE SODIUM PHOSPHATE 10 MG/ML IJ SOLN
INTRAMUSCULAR | Status: DC | PRN
Start: 2023-04-05 — End: 2023-04-05
  Administered 2023-04-05: 10 mg via INTRAVENOUS

## 2023-04-05 MED ORDER — CIPROFLOXACIN IN D5W 400 MG/200ML IV SOLN
400.0000 mg | Freq: Once | INTRAVENOUS | Status: AC
  Administered 2023-04-05: 400 mg via INTRAVENOUS

## 2023-04-05 MED ORDER — CIPROFLOXACIN IN D5W 400 MG/200ML IV SOLN
INTRAVENOUS | Status: AC
  Filled 2023-04-05: qty 200

## 2023-04-05 MED ORDER — LIDOCAINE HCL (PF) 2 % IJ SOLN
INTRAMUSCULAR | Status: DC | PRN
  Administered 2023-04-05: 40 mg via INTRADERMAL

## 2023-04-05 NOTE — Transfer of Care (Signed)
Immediate Anesthesia Transfer of Care Note  Patient: Iman A Garduno  Procedure(s) Performed: ENDOSCOPIC RETROGRADE CHOLANGIOPANCREATOGRAPHY (ERCP) WITH PROPOFOL BILIARY STENT PLACEMENT STENT REMOVAL  Patient Location: Endoscopy Unit  Anesthesia Type:General  Level of Consciousness: awake, alert , and oriented  Airway & Oxygen Therapy: Patient Spontanous Breathing and Patient connected to nasal cannula oxygen  Post-op Assessment: Report given to RN, Post -op Vital signs reviewed and stable, Patient moving all extremities X 4, and Patient able to stick tongue midline  Post vital signs: Reviewed and stable  Last Vitals:  Vitals Value Taken Time  BP 126/55 04/05/23 1346  Temp 98.6   Pulse 90 04/05/23 1347  Resp 15 04/05/23 1347  SpO2 94 % 04/05/23 1347  Vitals shown include unfiled device data.  Last Pain:  Vitals:   04/05/23 1346  TempSrc:   PainSc: 0-No pain      Patients Stated Pain Goal: 3 (04/02/23 0825)  Complications: No notable events documented.

## 2023-04-05 NOTE — Anesthesia Preprocedure Evaluation (Addendum)
Anesthesia Evaluation  Patient identified by MRN, date of birth, ID band Patient awake    Reviewed: Allergy & Precautions, NPO status , Patient's Chart, lab work & pertinent test results  Airway Mallampati: II  TM Distance: >3 FB Neck ROM: Full    Dental  (+) Teeth Intact, Dental Advisory Given   Pulmonary neg pulmonary ROS   breath sounds clear to auscultation       Cardiovascular hypertension, Pt. on medications  Rhythm:Regular Rate:Normal     Neuro/Psych  Headaches  negative psych ROS   GI/Hepatic Neg liver ROS,GERD  Medicated,,  Endo/Other  diabetes    Renal/GU CRFRenal disease     Musculoskeletal negative musculoskeletal ROS (+)    Abdominal   Peds  Hematology  (+) Blood dyscrasia, anemia   Anesthesia Other Findings Jaundice  Reproductive/Obstetrics                             Anesthesia Physical Anesthesia Plan  ASA: 2  Anesthesia Plan: General   Post-op Pain Management: Minimal or no pain anticipated   Induction: Intravenous  PONV Risk Score and Plan: 4 or greater and Ondansetron, Dexamethasone, Midazolam and Scopolamine patch - Pre-op  Airway Management Planned: Oral ETT  Additional Equipment: None  Intra-op Plan:   Post-operative Plan: Extubation in OR  Informed Consent: I have reviewed the patients History and Physical, chart, labs and discussed the procedure including the risks, benefits and alternatives for the proposed anesthesia with the patient or authorized representative who has indicated his/her understanding and acceptance.     Dental advisory given  Plan Discussed with: CRNA  Anesthesia Plan Comments:        Anesthesia Quick Evaluation

## 2023-04-05 NOTE — Anesthesia Postprocedure Evaluation (Signed)
Anesthesia Post Note  Patient: Cindy Trujillo  Procedure(s) Performed: ENDOSCOPIC RETROGRADE CHOLANGIOPANCREATOGRAPHY (ERCP) WITH PROPOFOL BILIARY STENT PLACEMENT STENT REMOVAL     Patient location during evaluation: PACU Anesthesia Type: General Level of consciousness: awake and alert Pain management: pain level controlled Vital Signs Assessment: post-procedure vital signs reviewed and stable Respiratory status: spontaneous breathing, nonlabored ventilation, respiratory function stable and patient connected to nasal cannula oxygen Cardiovascular status: blood pressure returned to baseline and stable Postop Assessment: no apparent nausea or vomiting Anesthetic complications: no  No notable events documented.  Last Vitals:  Vitals:   04/05/23 1410 04/05/23 1433  BP: (!) 116/59 (!) 121/58  Pulse: 76 75  Resp: (!) 21 18  Temp:  36.4 C  SpO2: 95% 96%    Last Pain:  Vitals:   04/05/23 1433  TempSrc: Oral  PainSc:                  Shelton Silvas

## 2023-04-05 NOTE — Progress Notes (Signed)
[facility]                                                                               Cindy Trujillo, is a 67 y.o. female, DOB - February 09, 1956, ZOX:[id number] Admit date - 04/01/2023    Outpatient Primary MD for the patient is Beryle Flock, Marzella Schlein, MD  LOS - 4  days    Brief summary   67yo with h/o DM, HTN, HLD, and DVT who presented to [facility] on 12/19 with abdominal pain and jaundice.  Bili 16.6.  [place] with nodular liver, trace ascites, ?varices, distended gallbladder with sludge.  CT with cirrhosis and CBD dilatation to 15 mm.  MRCP with CBD dilated to 14 mm, suspected 3 mm CBD stone.  She was transferred to [facility] for ERCP.  Surgery consulted. GI consulting, performed for EGD, ERCP on 12/20 which showed grade 2 esophageal varices with mild portal hypertensive gastropathy.  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  Pathology is pending but she is not a surgical candidate and surgery has signed off.  CA 19-9 is pending.   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  KUB showed stent in place.  Repeat ERCP on 12/23.  Pathology from ERCP on 12/21 is pending.    Acute kidney injury on stage IIIb CKD with acute metabolic acidosis Baseline creatinine around 1.4, continue to worsen and peaked at 3.1 today Concern for development of hepatorenal renal syndrome Nephrology consulted recommendations given. Ultrasound renal is pending.   Hypertension Blood pressure parameters are  optimal   Hyperlipidemia Crestor is on hold.   Type 2 diabetes mellitus Last A1c 7.5. Marland KitchenCBG (last 3)  Recent Labs    04/04/23 1950 04/05/23 0747 04/05/23 1150  GLUCAP 234* 148* 110*   Resume SSI.    History of right leg DVT Completed 6 months of eliquis.   Body mass index is 37.52 kg/m. Obesity Recommend outpatient follow up with PCP/ Bariatric surgery.    Estimated body mass index is 37.52 kg/m as calculated from the following:   Height as of this encounter: 5\' 4"  (1.626 m).   Weight as of this encounter: 99.2 kg.  Code Status: full code.  DVT Prophylaxis:  Place and maintain sequential compression device Start: 04/01/23 1259   Level of Care: Level of care: Med-Surg Family Communication: none at bedside.  Disposition Plan:     Remains inpatient appropriate:  clinical improvement.   Procedures:  ERCP ON 12/23  Consultants:   Nephrology gastroenterology  Antimicrobials:   Anti-infectives (From admission, onward)    Start     Dose/Rate Route Frequency Ordered Stop   04/05/23 1430  [MAR Hold]  ciprofloxacin (CIPRO) IVPB 400 mg        (  MAR Hold since Mon 04/05/2023 at 1145.Hold Reason: Transfer to a Procedural area)   400 mg 200 mL/hr over 60 Minutes Intravenous  Once 04/05/23 0207     04/02/23 1415  ampicillin-sulbactam (UNASYN) 1.5 g in sodium chloride 0.9 % 100 mL IVPB        1.5 g 200 mL/hr over 30 Minutes Intravenous  Once 04/02/23 1405 04/02/23 1502        Medications  Scheduled Meds:  [MAR Hold] diclofenac  100 mg Rectal Once   indomethacin  100 mg Rectal Once   [MAR Hold] insulin aspart  0-15 Units Subcutaneous TID WC   [MAR Hold] insulin aspart  0-5 Units Subcutaneous QHS   [MAR Hold] midodrine  10 mg Oral TID WC   [MAR Hold] pantoprazole (PROTONIX) IV  40 mg Intravenous QAC breakfast   Continuous Infusions:  sodium chloride     [MAR Hold] albumin human 50 g (04/05/23 0940)   [MAR Hold] ciprofloxacin     PRN Meds:.[MAR Hold]  acetaminophen **OR** [MAR Hold] acetaminophen, [MAR Hold] albuterol, [MAR Hold] bisacodyl, [MAR Hold] camphor-menthol, [MAR Hold] diphenhydrAMINE, [MAR Hold] hydrALAZINE, [MAR Hold]  HYDROmorphone (DILAUDID) injection, [MAR Hold] mineral oil-hydrophilic petrolatum, [MAR Hold] oxyCODONE, [MAR Hold] polyethylene glycol    Subjective:   Cindy Trujillo was seen and examined today. Itching all over, slightly worse than yesterday. No nausea or vomiting .   Objective:   Vitals:   04/04/23 2350 04/05/23 0414 04/05/23 0746 04/05/23 1145  BP:   (!) 102/49 (!) 109/54  Pulse:   74 76  Resp:   17 16  Temp:   97.8 F (36.6 C) 98 F (36.7 C)  TempSrc:   Oral Temporal  SpO2:   97% 97%  Weight: 99.2 kg 99.2 kg    Height:        Intake/Output Summary (Last 24 hours) at 04/05/2023 1243 Last data filed at 04/05/2023 0835 Gross per 24 hour  Intake 585.03 ml  Output 300 ml  Net 285.03 ml   Filed Weights   04/01/23 1300 04/04/23 2350 04/05/23 0414  Weight: 98.3 kg 99.2 kg 99.2 kg     Exam General exam: Appears calm and comfortable  Respiratory system: Clear to auscultation. Respiratory effort normal. Cardiovascular system: S1 & S2 heard, RRR. Gastrointestinal system: Abdomen is nondistended, soft and nontender. Central nervous system: Alert and oriented.  Extremities: Symmetric 5 x 5 power. Skin: No rashes,  Psychiatry:  Mood & affect appropriate.     Data Reviewed:  I have personally reviewed following labs and imaging studies   CBC Lab Results  Component Value Date   WBC 3.4 (L) 04/05/2023   RBC 3.16 (L) 04/05/2023   HGB 9.4 (L) 04/05/2023   HCT 28.5 (L) 04/05/2023   MCV 90.2 04/05/2023   MCH 29.7 04/05/2023   PLT 67 (L) 04/05/2023   MCHC 33.0 04/05/2023   RDW 17.9 (H) 04/05/2023   LYMPHSABS 0.3 (L) 04/03/2023   MONOABS 0.1 04/03/2023   EOSABS 0.0 04/03/2023   BASOSABS 0.0 04/03/2023     Last metabolic panel Lab Results  Component Value Date   NA 135 04/05/2023    K 4.1 04/05/2023   CL 109 04/05/2023   CO2 16 (L) 04/05/2023   BUN 51 (H) 04/05/2023   CREATININE 3.18 (H) 04/05/2023   GLUCOSE 167 (H) 04/05/2023   GFRNONAA 16 (L) 04/05/2023   GFRAA 57 (L) 12/06/2019   CALCIUM 8.6 (L) 04/05/2023   PROT 5.9 (L) 04/05/2023   ALBUMIN 2.6 (  L) 04/05/2023   LABGLOB 3.0 01/13/2023   AGRATIO 1.2 04/27/2022   BILITOT 21.3 (HH) 04/05/2023   ALKPHOS 146 (H) 04/05/2023   AST 74 (H) 04/05/2023   ALT 32 04/05/2023   ANIONGAP 10 04/05/2023    CBG (last 3)  Recent Labs    04/04/23 1950 04/05/23 0747 04/05/23 1150  GLUCAP 234* 148* 110*      Coagulation Profile: Recent Labs  Lab 03/31/23 1924 04/01/23 1142 04/02/23 0640 04/04/23 0623 04/05/23 0501  INR 1.3* 1.4* 1.3* 1.3* 1.5*     Radiology Studies: US RENAL Result Date: 04/05/2023 CLINICAL DATA:  Acute renal failure superimposed on stage 3 chronic kidney disease, unspecified acute renal failure type, unspecified whether stage 3a or 3b CKD (HCC) [id number] EXAM: RENAL / URINARY TRACT ULTRASOUND COMPLETE COMPARISON:  CT abdomen 03/31/2023 FINDINGS: Right Kidney: Renal measurements: 10.1 x 4.1 x 4.3 cm = volume: 94 mL. Echogenicity within normal limits. No mass or hydronephrosis visualized. Mild cortical thinning. Left Kidney: Renal measurements: 11.7 x 5.7 x 5.5 cm = volume: 190 mL. Echogenicity within normal limits. No mass or hydronephrosis visualized. Bladder: Appears normal for degree of bladder distention. Other: Perihepatic ascites. Nodular liver contour is compatible with cirrhosis. IMPRESSION: 1. No acute abnormality in the kidneys. No hydronephrosis. 2. Cirrhosis with ascites. Electronically Signed   By: Richarda Overlie M.D.   On: 04/05/2023 11:16   DG Abd Portable 1V Result Date: 04/04/2023 CLINICAL DATA:  Obstructive jaundice. EXAM: PORTABLE ABDOMEN - 1 VIEW COMPARISON:  CT scan 03/31/2023 FINDINGS: No gaseous small bowel dilatation. Common bile duct stent identified medial right abdomen.  Calcified granulomata noted in the spleen. Visualized bony anatomy unremarkable. IMPRESSION: Common bile duct stent identified medial right abdomen. Electronically Signed   By: Kennith Center M.D.   On: 04/04/2023 15:12       Kathlen Mody M.D. [facility] 04/05/2023, 12:43 PM  Available via Epic secure chat 7am-7pm After 7 pm, please refer to night coverage provider listed on amion.

## 2023-04-05 NOTE — Interval H&P Note (Signed)
History and Physical Interval Note:  04/05/2023 12:37 PM  Cindy Trujillo  has presented today for surgery, with the diagnosis of Biliary stricture, hyperbilirubinemia.  The various methods of treatment have been discussed with the patient and family. After consideration of risks, benefits and other options for treatment, the patient has consented to  Procedure(s): ENDOSCOPIC RETROGRADE CHOLANGIOPANCREATOGRAPHY (ERCP) WITH PROPOFOL (N/A) as a surgical intervention.  The patient's history has been reviewed, patient examined, no change in status, stable for surgery.  I have reviewed the patient's chart and labs.  Questions were answered to the patient's satisfaction.     Venita Lick. Russella Dar

## 2023-04-05 NOTE — Op Note (Addendum)
[facility] Patient Name: Cindy Trujillo Procedure Date : 04/05/2023 MRN: [id number] Attending MD: Meryl Dare , MD, [phone] Date of Birth: 16-Nov-1955 CSN: [id number] Age: 67 Admit Type: Inpatient Procedure:                ERCP Indications:              Jaundice, Stent change Providers:                Venita Lick. Russella Dar, MD Referring MD:             [facility] Medicines:                General Anesthesia Complications:            No immediate complications. Estimated Blood Loss:     Estimated blood loss was minimal. Procedure:                Pre-Anesthesia Assessment:                           - Prior to the procedure, a History and Physical                            was performed, and patient medications and                            allergies were reviewed. The patient's tolerance of                            previous anesthesia was also reviewed. The risks                            and benefits of the procedure and the sedation                            options and risks were discussed with the patient.                            All questions were answered, and informed consent                            was obtained. Prior Anticoagulants: The patient has                            taken no anticoagulant or antiplatelet agents. ASA                            Grade Assessment: III - A patient with severe                            systemic disease. After reviewing the risks and                            benefits, the patient was deemed in satisfactory  condition to undergo the procedure.                           After obtaining informed consent, the scope was                            passed under direct vision. Throughout the                            procedure, the patient's blood pressure, pulse, and                            oxygen saturations were monitored continuously. The                            TJF-Q190V ([id number]) Olympus  duodenoscope was                            introduced through the mouth, and used to inject                            contrast into and used to inject contrast into the                            bile duct. The ERCP was accomplished without                            difficulty. The patient tolerated the procedure                            well. Scope In: Scope Out: Findings:      A biliary stent was visible on the scout film. The scope was advanced to       the major papilla in the descending duodenum. Endoscopic findings       include portal gastropathy with mild oozing in the antrum, Grade I       esophageal varices, biliary stent, prior sphincterotomy and an abnormal,       firm papilla. Examination of the pharynx, larynx and associated       structures, and upper GI tract was otherwise normal. A straight 0.25       wire was passed into the biliary tree. The short-nosed traction       sphincterotome was passed over the guidewire and the bile duct was then       deeply cannulated. Contrast was injected. I personally interpreted the       bile duct images. There was appropriate flow of contrast through the       ducts. The right intrahepatics did not fill well. The lower third of the       main bile duct contained a single localized stenosis 6 mm in length. The       common bile duct was mildly dilated and diffusely dilated, secondary to       a stricture. The largest diameter was 8 mm. The common bile duct       contained small filling defects thought to be sludge. Cystic duct  filled. Gallbladder did not fill. The biliary tree was swept with a 9 mm       balloon starting at the bifurcation. Sludge was swept from the duct. The       biliary tree contained one plastic stent. This was found to be visibly       patent however with concern for stent dysfunction it was removed. The       stent was removed using a snare. The stent was sent for cytology. A 10       mm by 4 cm  partially covered metal stent was placed 3 cm into the common       bile duct. Bile flowed through the stent. The stent was in good       position. The PD was not entered by intention. Impression:               - Grade I esophageal varices.                           - Portal gastropathy with mild oozing.                           - Prior sphincterotomy.                           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                           - The examination was suspicious for CBD sludge.                           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 The stricture was malignant appearing.                           - The common bile duct was mildly dilated,                            secondary to a stricture.                           - The biliary tree was swept and sludge was found,                            removed.                           - One stent was exchanged in the common bile duct.                            10mm x 4cm covered SEMS placed. Recommendation:           -  Return patient to hospital ward for ongoing care.                           - Clear liquid diet for 2 hours, then advance as                            tolerated to soft diet today as tolerated.                           - Bilirubin elevation multifactorial due to biliary                            obstruction (treated), decompensated cirrhosis and                            worsening renal failure.                           - Await cytology and pathology.                           - Inpatient GI team will see again when pathology,                            cytology returns or we will contact her as                            outpatient if she is discharged.                           - Outpatient GI follow up with her primary GI                            physician, Eather Colas, MD at [facility]. Procedure Code(s):        --- Professional ---                           [phone], Endoscopic retrograde                            cholangiopancreatography (ERCP); with removal and                            exchange of stent(s), biliary or pancreatic duct,                            including pre- and post-dilation and guide wire                            passage, when performed, including sphincterotomy,                            when performed, each stent exchanged Diagnosis Code(s):        --- Professional ---  K83.1, Obstruction of bile duct                           R17, Unspecified jaundice                           Z46.59, Encounter for fitting and adjustment of                            other gastrointestinal appliance and device CPT copyright 2022 [employer]. All rights reserved. The codes documented in this report are preliminary and upon coder review may  be revised to meet current compliance requirements. Meryl Dare, MD 04/05/2023 2:08:17 PM This report has been signed electronically. Number of Addenda: 0

## 2023-04-05 NOTE — Progress Notes (Signed)
[facility] Progress Note    Assessment/ Plan:   AKI on CKD3a -baseline seems to range around 1.1-1.4. 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 Received contrast on 12/18 but likely not the culprit based on timeline -pending GI recommendations for elevated T.Bili. Receiving albumin x 2 days. Started on midodrine 10mg  TID 12/22-will continue. If no further improvement or worsening of Cr then will add on octreotide -Avoid nephrotoxic medications including NSAIDs and iodinated intravenous contrast exposure unless the latter is absolutely indicated.  Preferred narcotic agents for pain control are hydromorphone, fentanyl, and methadone. Morphine should not be used. Avoid Baclofen and avoid oral sodium phosphate and magnesium citrate based laxatives / bowel preps. Continue strict Input and Output monitoring. 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 Per GI  HTN -home meds on hold, agreed. Currently on midodrine  DM2 -per primary service  Pancytopenia -per primary, likely related to cirrhosis/splenomegaly  Metabolic acidosis -monitor for now. Likely related to AKI and cirrhosis  Subjective:   Patient seen and examined bedside. Currently NPO for possible repeat ERCP. She reports that she is urinating adequately without any issues. Reports abdominal distension. Did have some RUQ pain but better now. No other complaints.   Objective:   BP (!) 102/49 (BP Location: Right Arm)   Pulse 74   Temp 97.8 F (36.6 C) (Oral)   Resp 17   Ht 5\' 4"  (1.626 m)   Wt 99.2 kg   SpO2 97%   BMI 37.52 kg/m   Intake/Output  Summary (Last 24 hours) at 04/05/2023 1037 Last data filed at 04/05/2023 1610 Gross per 24 hour  Intake 585.03 ml  Output 300 ml  Net 285.03 ml   Weight change:   Physical Exam: Gen: NAD, jaundiced CVS: RRR Resp: CTA B/L Abd: soft, distended Ext: trace edema b/l Les Neuro: awake, alert  Imaging: DG Abd Portable 1V Result Date: 04/04/2023 CLINICAL DATA:  Obstructive jaundice. EXAM: PORTABLE ABDOMEN - 1 VIEW COMPARISON:  CT scan 03/31/2023 FINDINGS: No gaseous small bowel dilatation. Common bile duct stent identified medial right abdomen. Calcified granulomata noted in the spleen. Visualized bony anatomy unremarkable. IMPRESSION: Common bile duct stent identified medial right abdomen. Electronically Signed   By: Kennith Center M.D.   On: 04/04/2023 15:12    Labs: BMET Recent Labs  Lab 03/31/23 1257 04/01/23 1142 04/02/23 0640 04/03/23 0547 04/04/23 0623 04/05/23 0501  NA 134* 137 136 137 136 135  K 4.3 4.3 4.1 4.7 4.5 4.1  CL 106 112* 110 109 108 109  CO2 15* 17* 19* 18* 16* 16*  GLUCOSE 218* 135* 149* 261* 161* 167*  BUN 30* 27* 22 35* 48* 51*  CREATININE 1.42* 1.33* 1.17* 2.06* 3.13* 3.18*  CALCIUM 8.9 8.6* 8.3* 8.4* 8.5* 8.6*   CBC Recent Labs  Lab 03/31/23 1257 04/01/23 1142 04/02/23 0640 04/03/23 0547 04/05/23 0501  WBC 3.9* 3.1* 2.6* 1.9* 3.4*  NEUTROABS 2.7  --   --  1.6*  --   HGB 11.0* 10.9* 10.3* 9.9* 9.4*  HCT 34.2* 33.1* 30.9* 30.4* 28.5*  MCV 92.7 89.2 89.3 89.9 90.2  PLT 95* 94* 78* 62* 67*    Medications:  diclofenac  100 mg Rectal Once   insulin aspart  0-15 Units Subcutaneous TID WC   insulin aspart  0-5 Units Subcutaneous QHS   midodrine  10 mg Oral TID WC   pantoprazole (PROTONIX) IV  40 mg Intravenous QAC breakfast      Anthony Sar, MD [facility] 04/05/2023, 10:37 AM

## 2023-04-05 NOTE — Anesthesia Procedure Notes (Signed)
Procedure Name: Intubation Date/Time: 04/05/2023 12:48 PM  Performed by: Cy Blamer, CRNAPre-anesthesia Checklist: Patient identified, Emergency Drugs available, Suction available and Patient being monitored Patient Re-evaluated:Patient Re-evaluated prior to induction Oxygen Delivery Method: Circle system utilized Preoxygenation: Pre-oxygenation with 100% oxygen Induction Type: IV induction Ventilation: Mask ventilation without difficulty Laryngoscope Size: Miller and 2 Grade View: Grade I Tube type: Oral Tube size: 7.0 mm Number of attempts: 1 Airway Equipment and Method: Stylet and Oral airway Placement Confirmation: ETT inserted through vocal cords under direct vision, positive ETCO2 and breath sounds checked- equal and bilateral Secured at: 21 cm Tube secured with: Tape Dental Injury: Teeth and Oropharynx as per pre-operative assessment

## 2023-04-06 DIAGNOSIS — N1832 Chronic kidney disease, stage 3b: Secondary | ICD-10-CM | POA: Diagnosis not present

## 2023-04-06 DIAGNOSIS — E785 Hyperlipidemia, unspecified: Secondary | ICD-10-CM | POA: Diagnosis not present

## 2023-04-06 DIAGNOSIS — D696 Thrombocytopenia, unspecified: Secondary | ICD-10-CM | POA: Diagnosis not present

## 2023-04-06 DIAGNOSIS — Z86718 Personal history of other venous thrombosis and embolism: Secondary | ICD-10-CM | POA: Diagnosis not present

## 2023-04-06 DIAGNOSIS — K760 Fatty (change of) liver, not elsewhere classified: Secondary | ICD-10-CM | POA: Diagnosis not present

## 2023-04-06 DIAGNOSIS — E6609 Other obesity due to excess calories: Secondary | ICD-10-CM | POA: Diagnosis not present

## 2023-04-06 DIAGNOSIS — E1169 Type 2 diabetes mellitus with other specified complication: Secondary | ICD-10-CM | POA: Diagnosis not present

## 2023-04-06 DIAGNOSIS — E66812 Obesity, class 2: Secondary | ICD-10-CM | POA: Diagnosis not present

## 2023-04-06 DIAGNOSIS — K831 Obstruction of bile duct: Secondary | ICD-10-CM | POA: Diagnosis not present

## 2023-04-06 DIAGNOSIS — K7460 Unspecified cirrhosis of liver: Secondary | ICD-10-CM | POA: Diagnosis not present

## 2023-04-06 LAB — BASIC METABOLIC PANEL
Anion gap: 13 (ref 5–15)
BUN: 59 mg/dL — ABNORMAL HIGH (ref 8–23)
CO2: 13 mmol/L — ABNORMAL LOW (ref 22–32)
Calcium: 8.9 mg/dL (ref 8.9–10.3)
Chloride: 107 mmol/L (ref 98–111)
Creatinine, Ser: 3.8 mg/dL — ABNORMAL HIGH (ref 0.44–1.00)
GFR, Estimated: 13 mL/min — ABNORMAL LOW (ref >60)
Glucose, Bld: 263 mg/dL — ABNORMAL HIGH (ref 70–99)
Potassium: 5.3 mmol/L — ABNORMAL HIGH (ref 3.5–5.1)
Sodium: 133 mmol/L — ABNORMAL LOW (ref 135–145)

## 2023-04-06 LAB — GLUCOSE, CAPILLARY
Glucose-Capillary: 244 mg/dL — ABNORMAL HIGH (ref 70–99)
Glucose-Capillary: 311 mg/dL — ABNORMAL HIGH (ref 70–99)
Glucose-Capillary: 203 mg/dL — ABNORMAL HIGH (ref 70–99)
Glucose-Capillary: 207 mg/dL — ABNORMAL HIGH (ref 70–99)
Glucose-Capillary: 201 mg/dL — ABNORMAL HIGH (ref 70–99)

## 2023-04-06 LAB — SURGICAL PATHOLOGY

## 2023-04-06 LAB — HEPATIC FUNCTION PANEL
ALT: 30 U/L (ref 0–44)
AST: 55 U/L — ABNORMAL HIGH (ref 15–41)
Albumin: 3.1 g/dL — ABNORMAL LOW (ref 3.5–5.0)
Alkaline Phosphatase: 127 U/L — ABNORMAL HIGH (ref 38–126)
Bilirubin, Direct: 15.7 mg/dL — ABNORMAL HIGH (ref 0.0–0.2)
Indirect Bilirubin: 10.3 mg/dL — ABNORMAL HIGH (ref 0.3–0.9)
Total Bilirubin: 26 mg/dL (ref <1.2)
Total Protein: 6 g/dL — ABNORMAL LOW (ref 6.5–8.1)

## 2023-04-06 LAB — CYTOLOGY - NON PAP

## 2023-04-06 LAB — CBC
HCT: 28.1 % — ABNORMAL LOW (ref 36.0–46.0)
Hemoglobin: 9.1 g/dL — ABNORMAL LOW (ref 12.0–15.0)
MCH: 29.3 pg (ref 26.0–34.0)
MCHC: 32.4 g/dL (ref 30.0–36.0)
MCV: 90.4 fL (ref 80.0–100.0)
Platelets: 50 10*3/uL — ABNORMAL LOW (ref 150–400)
RBC: 3.11 MIL/uL — ABNORMAL LOW (ref 3.87–5.11)
RDW: 17.9 % — ABNORMAL HIGH (ref 11.5–15.5)
WBC: 2.2 10*3/uL — ABNORMAL LOW (ref 4.0–10.5)
nRBC: 0 % (ref 0.0–0.2)

## 2023-04-06 LAB — PROTIME-INR
INR: 1.5 — ABNORMAL HIGH (ref 0.8–1.2)
Prothrombin Time: 18.4 s — ABNORMAL HIGH (ref 11.4–15.2)

## 2023-04-06 MED ORDER — BISACODYL 5 MG PO TBEC
10.0000 mg | DELAYED_RELEASE_TABLET | Freq: Once | ORAL | Status: AC
  Administered 2023-04-06: 10 mg via ORAL
  Filled 2023-04-06: qty 2

## 2023-04-06 MED ORDER — SODIUM BICARBONATE 650 MG PO TABS
650.0000 mg | ORAL_TABLET | Freq: Two times a day (BID) | ORAL | Status: DC
  Administered 2023-04-06 – 2023-04-07 (×4): 650 mg via ORAL
  Filled 2023-04-06 (×5): qty 1

## 2023-04-06 MED ORDER — BISACODYL 10 MG RE SUPP
10.0000 mg | Freq: Every day | RECTAL | Status: DC | PRN
  Administered 2023-04-07: 10 mg via RECTAL
  Filled 2023-04-06: qty 1

## 2023-04-06 MED ORDER — ONDANSETRON HCL 4 MG/2ML IJ SOLN
4.0000 mg | Freq: Four times a day (QID) | INTRAMUSCULAR | Status: DC | PRN
  Administered 2023-04-07: 4 mg via INTRAVENOUS
  Filled 2023-04-06 (×2): qty 2

## 2023-04-06 MED ORDER — OCTREOTIDE ACETATE 100 MCG/ML IJ SOLN
100.0000 ug | Freq: Three times a day (TID) | INTRAMUSCULAR | Status: DC
  Administered 2023-04-06 – 2023-04-08 (×8): 100 ug via SUBCUTANEOUS
  Filled 2023-04-06 (×10): qty 1

## 2023-04-06 MED ORDER — SODIUM ZIRCONIUM CYCLOSILICATE 10 G PO PACK
10.0000 g | PACK | Freq: Two times a day (BID) | ORAL | Status: AC
  Administered 2023-04-06 (×2): 10 g via ORAL
  Filled 2023-04-06 (×2): qty 1

## 2023-04-06 NOTE — Progress Notes (Signed)
[facility]                                                                               Cindy Trujillo, is a 66 y.o. female, DOB - [birth year], ZOX:[id number] Admit date - 04/01/2023    Outpatient Primary MD for the patient is Beryle Flock, Marzella Schlein, MD  LOS - 5  days    Brief summary   67yo with h/o DM, HTN, HLD, and DVT who presented to [facility] on 12/19 with abdominal pain and jaundice.  Bili 16.6.  [place] with nodular liver, trace ascites, ?varices, distended gallbladder with sludge.  CT with cirrhosis and CBD dilatation to 15 mm.  MRCP with CBD dilated to 14 mm, suspected 3 mm CBD stone.  She was transferred to [facility] for ERCP.  Surgery consulted. GI consulting, performed for EGD, ERCP on 12/20 which showed grade 2 esophageal varices with mild portal hypertensive gastropathy.  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  Pathology is pending but she is not a surgical candidate and surgery has signed off.  CA 19-9 is pending.   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  KUB showed stent in place.  Repeat ERCP  on 12/23 showed malignant looking stricture. Biopsies done and results are pending.  Worsening jaundice and bilirubin today ,  bilirubin elevated at 26. And ST is 55 and ALT IS 30. continue to monitor. She denies any nausea or vomiting at this time.    Acute kidney injury on stage IIIb CKD with acute metabolic acidosis Baseline creatinine around 1.4, continue to worsen and peaked at 3.9  TODAY.  Concern for development of hepatorenal renal syndrome Nephrology consulted recommendations given. US kidney did not show any hydronephrosis.  S/p albumin infusion today.    Hyperkalemia - lokelma ordered.  Recheck creatinine and potassium in am.   Hypertension Blood pressure parameters are well controlled.    Hyperlipidemia Crestor is on hold.   Type 2 diabetes mellitus Last A1c 7.5. Marland KitchenCBG (last 3)  Recent Labs    04/05/23 1623 04/05/23 1940 04/06/23 0828  GLUCAP 171* 257* 244*   Resume SSI.    History of right leg DVT Completed 6 months of eliquis.   Body mass index is 38.86 kg/m. Obesity Recommend outpatient follow up with PCP/ Bariatric surgery.    Pancytopenia From cirrhosis.    Estimated body mass index is 38.86 kg/m as calculated from the following:   Height as of this encounter: 5\' 4"  (1.626 m).   Weight as of this encounter: 102.7 kg.  Code Status: full code.  DVT Prophylaxis:  Place and maintain sequential compression device Start: 04/01/23 1259   Level of Care: Level of care: Med-Surg Family Communication: none at  bedside.  Disposition Plan:     Remains inpatient appropriate:  clinical improvement.   Procedures:  ERCP ON 12/23  Consultants:   Nephrology gastroenterology  Antimicrobials:   Anti-infectives (From admission, onward)    Start     Dose/Rate Route Frequency Ordered Stop   04/05/23 1430  ciprofloxacin (CIPRO) IVPB 400 mg        400 mg 200 mL/hr over 60 Minutes Intravenous  Once 04/05/23 0207 04/05/23 1400   04/02/23 1415  ampicillin-sulbactam (UNASYN) 1.5 g in sodium chloride 0.9 % 100 mL IVPB        1.5 g 200 mL/hr over 30 Minutes Intravenous  Once 04/02/23 1405 04/02/23 1502        Medications  Scheduled Meds:  bisacodyl  10 mg Oral Once   diclofenac  100 mg Rectal Once   indomethacin  100 mg Rectal Once   insulin aspart  0-15 Units Subcutaneous TID WC   insulin aspart  0-5 Units Subcutaneous QHS    midodrine  10 mg Oral TID WC   pantoprazole (PROTONIX) IV  40 mg Intravenous QAC breakfast   sodium zirconium cyclosilicate  10 g Oral BID   Continuous Infusions:  albumin human 50 g (04/06/23 0846)   PRN Meds:.acetaminophen **OR** acetaminophen, albuterol, bisacodyl, camphor-menthol, diphenhydrAMINE, hydrALAZINE, HYDROmorphone (DILAUDID) injection, mineral oil-hydrophilic petrolatum, oxyCODONE, polyethylene glycol    Subjective:   Cindy Trujillo was seen and examined today.  No chest pain or sob. Abd soreness present. No nausea or vomiting.  Objective:   Vitals:   04/05/23 2308 04/06/23 0339 04/06/23 0348 04/06/23 0700  BP: (!) 98/50 104/65  (!) 114/51  Pulse: 62 62  64  Resp: 14 14  18   Temp: (!) 97.5 F (36.4 C) 97.6 F (36.4 C)  97.8 F (36.6 C)  TempSrc: Oral Oral  Oral  SpO2: 95% 97%  98%  Weight:   102.7 kg   Height:        Intake/Output Summary (Last 24 hours) at 04/06/2023 1004 Last data filed at 04/05/2023 2200 Gross per 24 hour  Intake 250 ml  Output 300 ml  Net -50 ml   Filed Weights   04/04/23 2350 04/05/23 0414 04/06/23 0348  Weight: 99.2 kg 99.2 kg 102.7 kg     Exam  General exam:elderly woman, jaundiced.  Respiratory system: Clear to auscultation. Respiratory effort normal. Cardiovascular system: S1 & S2 heard, RRR.  Gastrointestinal system: Abdomen is soft, mild gen tenderness . Bs+ Central nervous system: Alert and oriented.  Extremities: pedal edema present.  Skin: No rashes, Psychiatry: . Mood & affect appropriate.    Data Reviewed:  I have personally reviewed following labs and imaging studies   CBC Lab Results  Component Value Date   WBC 2.2 (L) 04/06/2023   RBC 3.11 (L) 04/06/2023   HGB 9.1 (L) 04/06/2023   HCT 28.1 (L) 04/06/2023   MCV 90.4 04/06/2023   MCH 29.3 04/06/2023   PLT 50 (L) 04/06/2023   MCHC 32.4 04/06/2023   RDW 17.9 (H) 04/06/2023   LYMPHSABS 0.3 (L) 04/03/2023   MONOABS 0.1 04/03/2023   EOSABS 0.0  04/03/2023   BASOSABS 0.0 04/03/2023     Last metabolic panel Lab Results  Component Value Date   NA 133 (L) 04/06/2023   K 5.3 (H) 04/06/2023   CL 107 04/06/2023   CO2 13 (L) 04/06/2023   BUN 59 (H) 04/06/2023   CREATININE 3.80 (H) 04/06/2023   GLUCOSE 263 (H) 04/06/2023   GFRNONAA 13 (  L) 04/06/2023   GFRAA 57 (L) 12/06/2019   CALCIUM 8.9 04/06/2023   PROT 6.0 (L) 04/06/2023   ALBUMIN 3.1 (L) 04/06/2023   LABGLOB 3.0 01/13/2023   AGRATIO 1.2 04/27/2022   BILITOT 26.0 (HH) 04/06/2023   ALKPHOS 127 (H) 04/06/2023   AST 55 (H) 04/06/2023   ALT 30 04/06/2023   ANIONGAP 13 04/06/2023    CBG (last 3)  Recent Labs    04/05/23 1623 04/05/23 1940 04/06/23 0828  GLUCAP 171* 257* 244*      Coagulation Profile: Recent Labs  Lab 04/01/23 1142 04/02/23 0640 04/04/23 0623 04/05/23 0501 04/06/23 0602  INR 1.4* 1.3* 1.3* 1.5* 1.5*     Radiology Studies: DG ERCP Result Date: 04/05/2023 CLINICAL DATA:  Elective surgery Biliary dilatation concerning for choledocholithiasis EXAM: ERCP TECHNIQUE: Multiple spot images obtained with the fluoroscopic device and submitted for interpretation post-procedure. FLUOROSCOPY: Radiation Exposure Index (as provided by the fluoroscopic device): 96 mGy Kerma COMPARISON:  04/02/2023 FINDINGS: Ten intraoperative fluoroscopic images were provided for interpretation. Initial images demonstrate plastic stent in place. Subsequent images show cannulation and opacification of the intra and extrahepatic bile ducts. No significant biliary dilatation is identified. No filling defects seen within the visualized common bile duct. Portions are obscured by the endoscope. Final image demonstrates a metallic biliary stent in place. IMPRESSION: Intraoperative cholangiogram demonstrates exchange of plastic biliary stent for metal. These images were submitted for radiologic interpretation only. Please see the procedural report for the amount of contrast and the  fluoroscopy time utilized. Electronically Signed   By: Acquanetta Belling M.D.   On: 04/05/2023 19:24   US RENAL Result Date: 04/05/2023 CLINICAL DATA:  Acute renal failure superimposed on stage 3 chronic kidney disease, unspecified acute renal failure type, unspecified whether stage 3a or 3b CKD (HCC) [id number] EXAM: RENAL / URINARY TRACT ULTRASOUND COMPLETE COMPARISON:  CT abdomen 03/31/2023 FINDINGS: Right Kidney: Renal measurements: 10.1 x 4.1 x 4.3 cm = volume: 94 mL. Echogenicity within normal limits. No mass or hydronephrosis visualized. Mild cortical thinning. Left Kidney: Renal measurements: 11.7 x 5.7 x 5.5 cm = volume: 190 mL. Echogenicity within normal limits. No mass or hydronephrosis visualized. Bladder: Appears normal for degree of bladder distention. Other: Perihepatic ascites. Nodular liver contour is compatible with cirrhosis. IMPRESSION: 1. No acute abnormality in the kidneys. No hydronephrosis. 2. Cirrhosis with ascites. Electronically Signed   By: Richarda Overlie M.D.   On: 04/05/2023 11:16   DG Abd Portable 1V Result Date: 04/04/2023 CLINICAL DATA:  Obstructive jaundice. EXAM: PORTABLE ABDOMEN - 1 VIEW COMPARISON:  CT scan 03/31/2023 FINDINGS: No gaseous small bowel dilatation. Common bile duct stent identified medial right abdomen. Calcified granulomata noted in the spleen. Visualized bony anatomy unremarkable. IMPRESSION: Common bile duct stent identified medial right abdomen. Electronically Signed   By: Kennith Center M.D.   On: 04/04/2023 15:12       Kathlen Mody M.D. [facility] 04/06/2023, 10:04 AM  Available via Epic secure chat 7am-7pm After 7 pm, please refer to night coverage provider listed on amion.

## 2023-04-06 NOTE — Progress Notes (Addendum)
[facility] Progress Note    Assessment/ Plan:   AKI on CKD3a -baseline seems to range around 1.1-1.4. 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 Received contrast on 12/18 but likely not the culprit based on timeline. No obstruction on renal ultrasound. Cr now up to 3.8, has yet to peak -pending GI recommendations for continuously elevated T.Bili. Stopping albumin, she has gotten adequate volume resuscitation. Started on midodrine 10mg  TID but no real improvement in MAPs. Will add octreotide subcutaneous TID today but if no improvement then will need to increase midodrine and octreotide -no indications for renal replacement therapy as of today -Avoid nephrotoxic medications including NSAIDs and iodinated intravenous contrast exposure unless the latter is absolutely indicated.  Preferred narcotic agents for pain control are hydromorphone, fentanyl, and methadone. Morphine should not be used. Avoid Baclofen and avoid oral sodium phosphate and magnesium citrate based laxatives / bowel preps. Continue strict Input and Output monitoring. 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 Will defer to primary service and GI to see if this is appropriate  Obstructive jaundice -s/p ERCP/EGD 12/20, s/p stent in ampulla/distal CBD -t bili continues to rise despite stent exchange 12/23. Per GI  HTN -home meds on hold, agreed. Currently on midodrine  DM2 -per primary service  Pancytopenia -per primary, likely related to cirrhosis/splenomegaly  Metabolic acidosis -Likely related to AKI and cirrhosis. Will start po bicarb  Edema -d/c'ed albumin. Encouraged  ambulation, don't see a strong indication for lasix since her edema is stable  Hyperkalemia -k 5.3 today, s/p lokelma, monitor for now  Discussed with husband at the bedside.  Addendum: looking at her med history list, looks like she received a diclofenac suppository yesterday and on 12/20 which is odd. It is a possibility that this could've contributed to some elevation in Cr. Please avoid any/all NSAIDs  Subjective:   Patient seen and examined bedside. S/p stent exchange/ercp yesterday. She reports that she is urinating adequately. Still has some swelling in her legs but unchanged, hoping to do some walking today. No other complaints. Denies any chest pain, SOB, N/V/D, loss of appetite, dysgeusia. Uop charted 0.5L Received lokelma for k 5.3   Objective:   BP (!) 114/51 (BP Location: Right Arm)   Pulse 64   Temp 97.8 F (36.6 C) (Oral)   Resp 18   Ht 5\' 4"  (1.626 m)   Wt 102.7 kg   SpO2 98%   BMI 38.86 kg/m   Intake/Output Summary (Last 24 hours) at 04/06/2023 1116 Last data filed at 04/05/2023 2200 Gross per 24 hour  Intake 250 ml  Output 300 ml  Net -50 ml   Weight change: 3.544 kg  Physical Exam: Gen: NAD, jaundiced CVS: RRR Resp: CTA B/L posteriorly Abd: soft, distended Ext: trace edema b/l Les Neuro: awake, alert  Imaging: DG ERCP Result Date: 04/05/2023 CLINICAL DATA:  Elective surgery Biliary dilatation concerning for choledocholithiasis EXAM: ERCP TECHNIQUE: Multiple spot images obtained with the fluoroscopic device and submitted for interpretation post-procedure. FLUOROSCOPY: Radiation Exposure Index (as provided by the fluoroscopic device): 96 mGy Kerma COMPARISON:  04/02/2023 FINDINGS: Ten intraoperative fluoroscopic images were provided for interpretation. Initial images demonstrate plastic stent in place. Subsequent images show cannulation and opacification of the  intra and extrahepatic bile ducts. No significant biliary dilatation is identified. No filling  defects seen within the visualized common bile duct. Portions are obscured by the endoscope. Final image demonstrates a metallic biliary stent in place. IMPRESSION: Intraoperative cholangiogram demonstrates exchange of plastic biliary stent for metal. These images were submitted for radiologic interpretation only. Please see the procedural report for the amount of contrast and the fluoroscopy time utilized. Electronically Signed   By: Acquanetta Belling M.D.   On: 04/05/2023 19:24   US RENAL Result Date: 04/05/2023 CLINICAL DATA:  Acute renal failure superimposed on stage 3 chronic kidney disease, unspecified acute renal failure type, unspecified whether stage 3a or 3b CKD (HCC) [id number] EXAM: RENAL / URINARY TRACT ULTRASOUND COMPLETE COMPARISON:  CT abdomen 03/31/2023 FINDINGS: Right Kidney: Renal measurements: 10.1 x 4.1 x 4.3 cm = volume: 94 mL. Echogenicity within normal limits. No mass or hydronephrosis visualized. Mild cortical thinning. Left Kidney: Renal measurements: 11.7 x 5.7 x 5.5 cm = volume: 190 mL. Echogenicity within normal limits. No mass or hydronephrosis visualized. Bladder: Appears normal for degree of bladder distention. Other: Perihepatic ascites. Nodular liver contour is compatible with cirrhosis. IMPRESSION: 1. No acute abnormality in the kidneys. No hydronephrosis. 2. Cirrhosis with ascites. Electronically Signed   By: Richarda Overlie M.D.   On: 04/05/2023 11:16   DG Abd Portable 1V Result Date: 04/04/2023 CLINICAL DATA:  Obstructive jaundice. EXAM: PORTABLE ABDOMEN - 1 VIEW COMPARISON:  CT scan 03/31/2023 FINDINGS: No gaseous small bowel dilatation. Common bile duct stent identified medial right abdomen. Calcified granulomata noted in the spleen. Visualized bony anatomy unremarkable. IMPRESSION: Common bile duct stent identified medial right abdomen. Electronically Signed   By: Kennith Center M.D.   On: 04/04/2023 15:12    Labs: BMET Recent Labs  Lab 03/31/23 1257 04/01/23 1142  04/02/23 0640 04/03/23 0547 04/04/23 0623 04/05/23 0501 04/06/23 0602  NA 134* 137 136 137 136 135 133*  K 4.3 4.3 4.1 4.7 4.5 4.1 5.3*  CL 106 112* 110 109 108 109 107  CO2 15* 17* 19* 18* 16* 16* 13*  GLUCOSE 218* 135* 149* 261* 161* 167* 263*  BUN 30* 27* 22 35* 48* 51* 59*  CREATININE 1.42* 1.33* 1.17* 2.06* 3.13* 3.18* 3.80*  CALCIUM 8.9 8.6* 8.3* 8.4* 8.5* 8.6* 8.9   CBC Recent Labs  Lab 03/31/23 1257 04/01/23 1142 04/02/23 0640 04/03/23 0547 04/05/23 0501 04/06/23 0602  WBC 3.9*   < > 2.6* 1.9* 3.4* 2.2*  NEUTROABS 2.7  --   --  1.6*  --   --   HGB 11.0*   < > 10.3* 9.9* 9.4* 9.1*  HCT 34.2*   < > 30.9* 30.4* 28.5* 28.1*  MCV 92.7   < > 89.3 89.9 90.2 90.4  PLT 95*   < > 78* 62* 67* 50*   < > = values in this interval not displayed.    Medications:     diclofenac  100 mg Rectal Once   indomethacin  100 mg Rectal Once   insulin aspart  0-15 Units Subcutaneous TID WC   insulin aspart  0-5 Units Subcutaneous QHS   midodrine  10 mg Oral TID WC   octreotide  100 mcg Subcutaneous TID   pantoprazole (PROTONIX) IV  40 mg Intravenous QAC breakfast   sodium zirconium cyclosilicate  10 g Oral BID      Anthony Sar, MD [facility] 04/06/2023, 11:16 AM

## 2023-04-07 ENCOUNTER — Inpatient Hospital Stay (HOSPITAL_COMMUNITY): Payer: Commercial Managed Care - PPO

## 2023-04-07 ENCOUNTER — Encounter (HOSPITAL_COMMUNITY): Payer: Self-pay | Admitting: Internal Medicine

## 2023-04-07 DIAGNOSIS — N189 Chronic kidney disease, unspecified: Secondary | ICD-10-CM | POA: Diagnosis not present

## 2023-04-07 DIAGNOSIS — E1169 Type 2 diabetes mellitus with other specified complication: Secondary | ICD-10-CM | POA: Diagnosis not present

## 2023-04-07 DIAGNOSIS — D696 Thrombocytopenia, unspecified: Secondary | ICD-10-CM | POA: Diagnosis not present

## 2023-04-07 DIAGNOSIS — E6609 Other obesity due to excess calories: Secondary | ICD-10-CM | POA: Diagnosis not present

## 2023-04-07 DIAGNOSIS — Z86718 Personal history of other venous thrombosis and embolism: Secondary | ICD-10-CM | POA: Diagnosis not present

## 2023-04-07 DIAGNOSIS — K7200 Acute and subacute hepatic failure without coma: Secondary | ICD-10-CM

## 2023-04-07 DIAGNOSIS — K7201 Acute and subacute hepatic failure with coma: Secondary | ICD-10-CM | POA: Insufficient documentation

## 2023-04-07 DIAGNOSIS — E785 Hyperlipidemia, unspecified: Secondary | ICD-10-CM | POA: Diagnosis not present

## 2023-04-07 DIAGNOSIS — E66812 Obesity, class 2: Secondary | ICD-10-CM | POA: Diagnosis not present

## 2023-04-07 DIAGNOSIS — K831 Obstruction of bile duct: Secondary | ICD-10-CM | POA: Diagnosis not present

## 2023-04-07 DIAGNOSIS — K760 Fatty (change of) liver, not elsewhere classified: Secondary | ICD-10-CM | POA: Diagnosis not present

## 2023-04-07 DIAGNOSIS — K767 Hepatorenal syndrome: Secondary | ICD-10-CM

## 2023-04-07 DIAGNOSIS — K7460 Unspecified cirrhosis of liver: Secondary | ICD-10-CM | POA: Diagnosis not present

## 2023-04-07 DIAGNOSIS — N1832 Chronic kidney disease, stage 3b: Secondary | ICD-10-CM | POA: Diagnosis not present

## 2023-04-07 LAB — HEPATITIS B SURFACE ANTIGEN: Hepatitis B Surface Ag: NONREACTIVE

## 2023-04-07 LAB — COMPREHENSIVE METABOLIC PANEL
ALT: 31 U/L (ref 0–44)
AST: 58 U/L — ABNORMAL HIGH (ref 15–41)
Albumin: 3.4 g/dL — ABNORMAL LOW (ref 3.5–5.0)
Alkaline Phosphatase: 127 U/L — ABNORMAL HIGH (ref 38–126)
Anion gap: 14 (ref 5–15)
BUN: 72 mg/dL — ABNORMAL HIGH (ref 8–23)
CO2: 14 mmol/L — ABNORMAL LOW (ref 22–32)
Calcium: 9.1 mg/dL (ref 8.9–10.3)
Chloride: 106 mmol/L (ref 98–111)
Creatinine, Ser: 4.99 mg/dL — ABNORMAL HIGH (ref 0.44–1.00)
GFR, Estimated: 9 mL/min — ABNORMAL LOW (ref >60)
Glucose, Bld: 232 mg/dL — ABNORMAL HIGH (ref 70–99)
Potassium: 5 mmol/L (ref 3.5–5.1)
Sodium: 134 mmol/L — ABNORMAL LOW (ref 135–145)
Total Bilirubin: 29.9 mg/dL (ref <1.2)
Total Protein: 6.3 g/dL — ABNORMAL LOW (ref 6.5–8.1)

## 2023-04-07 LAB — CBC
HCT: 29.8 % — ABNORMAL LOW (ref 36.0–46.0)
Hemoglobin: 9.9 g/dL — ABNORMAL LOW (ref 12.0–15.0)
MCH: 29.6 pg (ref 26.0–34.0)
MCHC: 33.2 g/dL (ref 30.0–36.0)
MCV: 89.2 fL (ref 80.0–100.0)
Platelets: 76 10*3/uL — ABNORMAL LOW (ref 150–400)
RBC: 3.34 MIL/uL — ABNORMAL LOW (ref 3.87–5.11)
RDW: 18 % — ABNORMAL HIGH (ref 11.5–15.5)
WBC: 5.7 10*3/uL (ref 4.0–10.5)
nRBC: 0 % (ref 0.0–0.2)

## 2023-04-07 LAB — GLUCOSE, CAPILLARY
Glucose-Capillary: 222 mg/dL — ABNORMAL HIGH (ref 70–99)
Glucose-Capillary: 210 mg/dL — ABNORMAL HIGH (ref 70–99)
Glucose-Capillary: 156 mg/dL — ABNORMAL HIGH (ref 70–99)
Glucose-Capillary: 130 mg/dL — ABNORMAL HIGH (ref 70–99)

## 2023-04-07 LAB — HEPATITIS B CORE ANTIBODY, IGM: Hep B C IgM: NONREACTIVE

## 2023-04-07 LAB — PROTIME-INR
INR: 1.6 — ABNORMAL HIGH (ref 0.8–1.2)
Prothrombin Time: 18.9 s — ABNORMAL HIGH (ref 11.4–15.2)

## 2023-04-07 MED ORDER — INSULIN ASPART 100 UNIT/ML IJ SOLN
2.0000 [IU] | Freq: Three times a day (TID) | INTRAMUSCULAR | Status: DC
  Administered 2023-04-07: 2 [IU] via SUBCUTANEOUS

## 2023-04-07 MED ORDER — CHLORHEXIDINE GLUCONATE CLOTH 2 % EX PADS: 6.0000 | MEDICATED_PAD | Freq: Every day | CUTANEOUS | Status: DC

## 2023-04-07 MED ORDER — ONDANSETRON HCL 4 MG/2ML IJ SOLN
INTRAMUSCULAR | Status: AC
  Filled 2023-04-07: qty 2

## 2023-04-07 MED ORDER — PROMETHAZINE HCL 25 MG/ML IJ SOLN
12.5000 mg | Freq: Once | INTRAMUSCULAR | Status: AC
  Administered 2023-04-07: 12.5 mg via INTRAVENOUS
  Filled 2023-04-07: qty 12.5

## 2023-04-07 MED ORDER — FLEET ENEMA RE ENEM
1.0000 | ENEMA | Freq: Once | RECTAL | Status: DC
  Filled 2023-04-07: qty 1

## 2023-04-07 MED ORDER — SODIUM CHLORIDE 0.9 % IV SOLN
12.5000 mg | Freq: Once | INTRAVENOUS | Status: AC
  Administered 2023-04-07: 12.5 mg via INTRAVENOUS
  Filled 2023-04-07: qty 12.5

## 2023-04-07 MED ORDER — MIDODRINE HCL 5 MG PO TABS
15.0000 mg | ORAL_TABLET | Freq: Three times a day (TID) | ORAL | Status: DC
  Administered 2023-04-07 (×2): 15 mg via ORAL
  Filled 2023-04-07 (×4): qty 3

## 2023-04-07 NOTE — Progress Notes (Signed)
[facility] ROUNDING NOTE   Subjective: Patient feels poorly overall, but denies significant abdominal pain.  She does have persistent nausea and poor appetite, but no vomiting.  She denies any pruritus.  No issues with cognition or difficulty thinking or concentrating.   Objective: Vital signs in last 24 hours: Temp:  [98 F (36.7 C)-98.3 F (36.8 C)] 98.3 F (36.8 C) (12/25 0446) Pulse Rate:  [57-62] 57 (12/25 0846) Resp:  [17-18] 17 (12/25 0846) BP: (93-117)/(44-69) 101/45 (12/25 0846) SpO2:  [97 %-100 %] 97 % (12/25 0846) Weight:  [105.9 kg] 105.9 kg (12/25 0500) Last BM Date : 03/31/23 General: Ill-appearing Caucasian female, laying in bed, husband at bedside.  Alert and oriented, good historian Lungs:  CTA b/l, no w/r/r Heart:  RRR, no m/r/g Abdomen:  Soft, NT, ND, +BS Ext: Trace bilateral edema Skin:  Deeply jaundiced Neuro:   No asterixis    Intake/Output from previous day: 12/24 0701 - 12/25 0700 In: -  Out: 275 [Urine:225; Emesis/NG output:50] Intake/Output this shift: Total I/O In: -  Out: 100 [Urine:100]   Lab Results: Recent Labs    04/05/23 0501 04/06/23 0602 04/07/23 0546  WBC 3.4* 2.2* 5.7  HGB 9.4* 9.1* 9.9*  PLT 67* 50* 76*  MCV 90.2 90.4 89.2   BMET Recent Labs    04/05/23 0501 04/06/23 0602 04/07/23 0546  NA 135 133* 134*  K 4.1 5.3* 5.0  CL 109 107 106  CO2 16* 13* 14*  GLUCOSE 167* 263* 232*  BUN 51* 59* 72*  CREATININE 3.18* 3.80* 4.99*  CALCIUM 8.6* 8.9 9.1   LFT Recent Labs    04/05/23 0501 04/06/23 0602 04/07/23 0546  PROT 5.9* 6.0* 6.3*  ALBUMIN 2.6* 3.1* 3.4*  AST 74* 55* 58*  ALT 32 30 31  ALKPHOS 146* 127* 127*  BILITOT 21.3* 26.0* 29.9*  BILIDIR 14.1* 15.7*  --   IBILI 7.2* 10.3*  --    PT/INR Recent Labs    04/06/23 0602 04/07/23 0546  INR 1.5* 1.6*      Imaging/Other results: No results found.    Assessment and Plan:  67 year old female with diabetes, and known MASH with  previous biopsy December 23 showing stage III fibrosis, who was admitted December 19 with symptoms of nausea, vomiting jaundice and dilated bile duct with concerns for common bile duct stone.  Bilirubin was 16 at the time of admission.  Her bilirubin had been 1.4 two months prior. 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 After bilirubin increased following stent placement, it was presumed that the stent had become occluded or was not functioning.  She underwent a repeat ERCP with stent exchange on December 23, this time with a 10 mm x 4 cm partially covered metal stent. However, even after this stent replacement, her bilirubin has continued to rise.  In addition, she has had rapid decline in renal function, and is now approaching need for dialysis.  Nephrology has been following and feels that the patient has either hepatorenal syndrome versus bile nephropathy.  She has not had a meaningful response to albumin, octreotide and midodrine  Acute on chronic liver failure Patient's presentation atypical.  Patient did not have a previous diagnosis of cirrhosis prior to admission, but did have clinically significant fibrosis on biopsy a year ago.  Her bilirubin was  1.4 in October but was 16 on admission.  It was thought that the bilirubin elevation was secondary to biliary obstruction, however the bilirubin has continued to increase drastically despite 2 biliary stents.  She does not drink alcohol. I question whether the patient may have some degree of drug-induced liver injury on top of her underlying early cirrhosis.  She was prescribed Augmentin in November, which has been known to cause significant drug-induced liver injury. I reached out to [facility] hepatology to discuss potential  transfer, but they do not have availability at this time.  I am currently waiting to hear back from them to see if there are further recommendations for further testing or management in the meantime. I would recommend we broaden the search for potential precipitants for her acute on chronic liver failure to include infectious and autoimmune insults.  Although she has a previous diagnosis of MASH, I do not see serology tests for autoimmune liver disease. Is reassuring that she has very good mental status with no concerns for encephalopathy on my exam. -Anti-smooth muscle, antimitochondrial antibodies, IgG levels, ANA, anti- LKM - Hepatitis B IgM -CMV IgG/IgM, EBV IgG/IgM, HSV neurology -Daily MELD labs -Potential further recs pending discussion with [facility] hepatology  Ampullary lesion/biliary stricture - Biopsies, brushings and stent cytology results negative/nondiagnostic -Elevated CA 19-9 (232) - Patient will need endoscopic ultrasound for further evaluation - She will likely need to be sent to a tertiary institution for this, as our EUS capabilities are very limited for the next 1 to 2 months   Acute on chronic renal failure, suspect HRS versus bile nephropathy - Nephrology following, tentatively planning to start dialysis tomorrow - Creatinine increasing daily, not responsive to albumin, midodrine (increased to 15 3 times daily), started on octreotide yesterday    Jenel Lucks, MD  04/07/2023, 2:42 PM [facility]

## 2023-04-07 NOTE — Plan of Care (Signed)
  Problem: Clinical Measurements: Goal: Ability to maintain clinical measurements within normal limits will improve Outcome: Progressing   Problem: Clinical Measurements: Goal: Will remain free from infection Outcome: Progressing   Problem: Clinical Measurements: Goal: Diagnostic test results will improve Outcome: Progressing   Problem: Clinical Measurements: Goal: Cardiovascular complication will be avoided Outcome: Progressing   Problem: Nutrition: Goal: Adequate nutrition will be maintained Outcome: Progressing   Problem: Activity: Goal: Risk for activity intolerance will decrease Outcome: Progressing

## 2023-04-07 NOTE — Progress Notes (Signed)
[name], female   DOB: July 06, 1955, 67 y.o.   MRN: [id number]   Patient given tap water enema. Minimally successful with only very small amount of pebble, pellet like clay colored stool.  Lidia Collum, RN

## 2023-04-07 NOTE — Progress Notes (Signed)
[facility] Progress Note    Assessment/ Plan:   AKI on CKD3a -baseline seems to range around 1.1-1.4. 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 Received contrast on 12/18 but likely not the culprit based on timeline. No obstruction on renal ultrasound. Also received diclofenac suppositories x 2 during this admission (not sure why). Cr worsening--now up to 4.99, has yet to peak -pending GI recommendations for continuously elevated T.Bili. Stopped albumin, she has gotten adequate volume resuscitation. Started on midodrine 10mg  TID but no real improvement in MAPs--increasing to 15mg  TID, c/w octreotide subcutaneous -given her issues with new nausea/vomiting/loss of appetite/dysgeusia along with worsening renal fntn, I am concerned for uremic symptoms. At this junction, will need to start dialysis which we discussed in detail today. They are in agreement. Will keep NPO after midnight, will consult IR for temp HD line placement. Plan to start HD 12/26--slow start protocol -Avoid nephrotoxic medications including NSAIDs and iodinated intravenous contrast exposure unless the latter is absolutely indicated.  Preferred narcotic agents for pain control are hydromorphone, fentanyl, and methadone. Morphine should not be used. Avoid Baclofen and avoid oral sodium phosphate and magnesium citrate based laxatives / bowel preps. Continue strict Input and Output monitoring. 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 Will defer to primary service and GI to see if this is appropriate  Obstructive jaundice -s/p ERCP/EGD 12/20, s/p stent in ampulla/distal CBD -ca 19-9  232-high -t bili continues to rise despite stent exchange 12/23 -Per GI  HTN -home meds on hold, agreed. Currently on midodrine. Titrating up as above  DM2 -per primary service  Pancytopenia -per primary, likely related to cirrhosis/splenomegaly  Metabolic acidosis -Likely related to AKI and cirrhosis. On po bicarb, can stop once on HD  Edema -d/c'ed albumin. Hd as above  Hyperkalemia -s/p lokelma on Tuesday, k 5 today  Hyponatremia -mild, monitor for now  Discussed with husband at the bedside, discussed with primary service.   Subjective:   Patient seen and examined bedside. Husband in room. Started having nausea/vomiting since yesterday. No interest in eating, having loss of appetite along with dysgeusia.    Objective:   BP (!) 101/45 (BP Location: Right Arm)   Pulse (!) 57   Temp 98.3 F (36.8 C) (Oral)   Resp 17   Ht 5\' 4"  (1.626 m)   Wt 105.9 kg   SpO2 97%   BMI 40.07 kg/m   Intake/Output Summary (Last 24 hours) at 04/07/2023 1031 Last data filed at 04/07/2023 0801 Gross per 24 hour  Intake --  Output 375 ml  Net -375 ml   Weight change: 3.2 kg  Physical Exam: Gen: ill appearing, jaundiced CVS: RRR Resp: CTA B/L Abd: soft, distended Ext: edema b/l Les Neuro: awake, alert, no asterixis  Imaging: DG ERCP Result Date: 04/05/2023 CLINICAL DATA:  Elective surgery Biliary dilatation concerning for choledocholithiasis EXAM: ERCP TECHNIQUE: Multiple spot images obtained with the fluoroscopic device and submitted for interpretation post-procedure. FLUOROSCOPY: Radiation Exposure Index (as provided by the fluoroscopic device): 96 mGy Kerma COMPARISON:  04/02/2023 FINDINGS: Ten intraoperative fluoroscopic images were provided for interpretation. Initial images demonstrate plastic stent in place. Subsequent images show cannulation and opacification of the intra and extrahepatic bile ducts. No significant  biliary dilatation is identified. No filling defects seen  within the visualized common bile duct. Portions are obscured by the endoscope. Final image demonstrates a metallic biliary stent in place. IMPRESSION: Intraoperative cholangiogram demonstrates exchange of plastic biliary stent for metal. These images were submitted for radiologic interpretation only. Please see the procedural report for the amount of contrast and the fluoroscopy time utilized. Electronically Signed   By: Acquanetta Belling M.D.   On: 04/05/2023 19:24    Labs: BMET Recent Labs  Lab 04/01/23 1142 04/02/23 0640 04/03/23 0547 04/04/23 0623 04/05/23 0501 04/06/23 0602 04/07/23 0546  NA 137 136 137 136 135 133* 134*  K 4.3 4.1 4.7 4.5 4.1 5.3* 5.0  CL 112* 110 109 108 109 107 106  CO2 17* 19* 18* 16* 16* 13* 14*  GLUCOSE 135* 149* 261* 161* 167* 263* 232*  BUN 27* 22 35* 48* 51* 59* 72*  CREATININE 1.33* 1.17* 2.06* 3.13* 3.18* 3.80* 4.99*  CALCIUM 8.6* 8.3* 8.4* 8.5* 8.6* 8.9 9.1   CBC Recent Labs  Lab 03/31/23 1257 04/01/23 1142 04/03/23 0547 04/05/23 0501 04/06/23 0602 04/07/23 0546  WBC 3.9*   < > 1.9* 3.4* 2.2* 5.7  NEUTROABS 2.7  --  1.6*  --   --   --   HGB 11.0*   < > 9.9* 9.4* 9.1* 9.9*  HCT 34.2*   < > 30.4* 28.5* 28.1* 29.8*  MCV 92.7   < > 89.9 90.2 90.4 89.2  PLT 95*   < > 62* 67* 50* 76*   < > = values in this interval not displayed.    Medications:     insulin aspart  0-15 Units Subcutaneous TID WC   insulin aspart  0-5 Units Subcutaneous QHS   midodrine  15 mg Oral TID WC   octreotide  100 mcg Subcutaneous TID   pantoprazole (PROTONIX) IV  40 mg Intravenous QAC breakfast   sodium bicarbonate  650 mg Oral BID   sodium phosphate  1 enema Rectal Once      Anthony Sar, MD [facility] 04/07/2023, 10:31 AM

## 2023-04-07 NOTE — Progress Notes (Signed)
Patient was unable to tolerate po meds last night due to N/V. Received order for zofran but patient did not feel comfortable to try after receiving po meds.

## 2023-04-07 NOTE — Progress Notes (Addendum)
[facility]                                                                               Cindy Trujillo, is a 67 y.o. female, DOB - Jul 22, 1955, ZHY:[id number] Admit date - 04/01/2023    Outpatient Primary MD for the patient is Beryle Flock, Marzella Schlein, MD  LOS - 6  days    Brief summary   67yo with h/o DM, HTN, HLD, and DVT who presented to [facility] on 12/19 with abdominal pain and jaundice.  Bili 16.6.  [place] with nodular liver, trace ascites, ?varices, distended gallbladder with sludge.  CT with cirrhosis and CBD dilatation to 15 mm.  MRCP with CBD dilated to 14 mm, suspected 3 mm CBD stone.  She was transferred to [facility] for ERCP.  Surgery consulted. GI consulting, performed for EGD, ERCP on 12/20 which showed grade 2 esophageal varices with mild portal hypertensive gastropathy.  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  Pathology is negative for malignant cells. She underwent repeat ERCP for persistent hyperbilirubinemia, and repeat biopsy is negative for malignant cells. Hospital stay is significant for worsening creatinine, possible hepatorenal syndrome. Nephrology on board and plan for short term HD in the next 48 hours.    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 Unclear etiology for CBD dilatation, stone vs complicated stricture. 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 Her bili continues to worsen despite stent placement.  KUB showed stent in place.  Repeat ERCP  on 12/23 showed malignant looking stricture. Biopsies done and results are negative for malignant cells.  Hyperbilirubinemia continues to worsen despite the  stent placement.  continue to monitor. She denies any nausea or vomiting at this time. She is nauseated and does not have an appetite.    Decompensated Cirrhosis Currently on octreotide and midodrine. Completed albumin infusions yesterday.   Acute kidney injury on stage IIIb CKD with acute metabolic acidosis Baseline creatinine around 1.4, creatinine continues to worsen to 4.9 today.  Concern for development of hepatorenal renal syndrome Nephrology consulted, plan for HD in the next 24 hours.  US kidney did not show any hydronephrosis.  Completed albumin infusions.  Continue with sodium bicarbonate tablet.    Hyperkalemia Resolved with Lokelma.  Repeat potassium is 5.   Hypertension Blood pressure parameters borderline low.    Hyperlipidemia Crestor is on hold.   Type 2 diabetes mellitus Last A1c 7.5. Marland KitchenCBG (last 3)  Recent Labs    04/06/23 2048 04/07/23 0829 04/07/23 1220  GLUCAP 201* 222* 210*   Resume SSI.  Start her on NOVOLOG 2 units TIDAC.    History of right leg DVT Completed 6 months of eliquis.   Body mass index is 40.07 kg/m. Obesity Recommend outpatient follow up with PCP/ Bariatric surgery.    Pancytopenia From cirrhosis.   Constipation On dulcolax oral  and enema today.    Estimated body mass index is 40.07 kg/m as calculated from the following:   Height as of this encounter: 5\' 4"  (1.626 m).   Weight as of this encounter: 105.9 kg.  Code Status: full code.  DVT Prophylaxis:  Place and maintain sequential compression device Start: 04/01/23 1259   Level of Care: Level of care: Med-Surg Family Communication: none at bedside.  Disposition Plan:     Remains inpatient appropriate:   pending clinical improvement.   Procedures:  ERCP ON 12/23  Consultants:   Nephrology gastroenterology  Antimicrobials:   Anti-infectives (From admission, onward)    Start     Dose/Rate Route Frequency Ordered Stop   04/05/23 1430  ciprofloxacin  (CIPRO) IVPB 400 mg        400 mg 200 mL/hr over 60 Minutes Intravenous  Once 04/05/23 0207 04/05/23 1400   04/02/23 1415  ampicillin-sulbactam (UNASYN) 1.5 g in sodium chloride 0.9 % 100 mL IVPB        1.5 g 200 mL/hr over 30 Minutes Intravenous  Once 04/02/23 1405 04/02/23 1502        Medications  Scheduled Meds:  [START ON 04/08/2023] Chlorhexidine Gluconate Cloth  6 each Topical Q0600   insulin aspart  0-15 Units Subcutaneous TID WC   insulin aspart  0-5 Units Subcutaneous QHS   midodrine  15 mg Oral TID WC   octreotide  100 mcg Subcutaneous TID   ondansetron       pantoprazole (PROTONIX) IV  40 mg Intravenous QAC breakfast   sodium bicarbonate  650 mg Oral BID   Continuous Infusions:   PRN Meds:.acetaminophen **OR** acetaminophen, albuterol, bisacodyl, camphor-menthol, diphenhydrAMINE, hydrALAZINE, HYDROmorphone (DILAUDID) injection, mineral oil-hydrophilic petrolatum, ondansetron, ondansetron (ZOFRAN) IV, oxyCODONE, polyethylene glycol    Subjective:   Cindy Trujillo was seen and examined today.   Nauseated, one episode of vomiting.  No chest pain or sob.  Objective:   Vitals:   04/06/23 2049 04/07/23 0446 04/07/23 0500 04/07/23 0846  BP: (!) 117/54 (!) 93/44  (!) 101/45  Pulse: (!) 58 62  (!) 57  Resp: 18   17  Temp: 98 F (36.7 C) 98.3 F (36.8 C)    TempSrc: Oral Oral  Oral  SpO2: 98% 100%  97%  Weight:   105.9 kg   Height:        Intake/Output Summary (Last 24 hours) at 04/07/2023 1305 Last data filed at 04/07/2023 0801 Gross per 24 hour  Intake --  Output 150 ml  Net -150 ml   Filed Weights   04/05/23 0414 04/06/23 0348 04/07/23 0500  Weight: 99.2 kg 102.7 kg 105.9 kg     Exam General exam: elderly woman, jaundiced. Not in distress.  Respiratory system: Clear to auscultation. Respiratory effort normal. Cardiovascular system: S1 & S2 heard, RRR.  Gastrointestinal system: Abdomen is soft, mild gen tenderness. Bs+ Central nervous system:  Alert and oriented. Grossly non focal.  Extremities: leg edema.  Skin: No rashes, Psychiatry:  Mood & affect appropriate.     Data Reviewed:  I have personally reviewed following labs and imaging studies   CBC Lab Results  Component Value Date   WBC 5.7 04/07/2023   RBC 3.34 (L) 04/07/2023   HGB 9.9 (L) 04/07/2023   HCT 29.8 (L) 04/07/2023   MCV 89.2 04/07/2023   MCH 29.6 04/07/2023   PLT 76 (L) 04/07/2023   MCHC 33.2 04/07/2023   RDW 18.0 (H) 04/07/2023   LYMPHSABS 0.3 (L) 04/03/2023  MONOABS 0.1 04/03/2023   EOSABS 0.0 04/03/2023   BASOSABS 0.0 04/03/2023     Last metabolic panel Lab Results  Component Value Date   NA 134 (L) 04/07/2023   K 5.0 04/07/2023   CL 106 04/07/2023   CO2 14 (L) 04/07/2023   BUN 72 (H) 04/07/2023   CREATININE 4.99 (H) 04/07/2023   GLUCOSE 232 (H) 04/07/2023   GFRNONAA 9 (L) 04/07/2023   GFRAA 57 (L) 12/06/2019   CALCIUM 9.1 04/07/2023   PROT 6.3 (L) 04/07/2023   ALBUMIN 3.4 (L) 04/07/2023   LABGLOB 3.0 01/13/2023   AGRATIO 1.2 04/27/2022   BILITOT 29.9 (HH) 04/07/2023   ALKPHOS 127 (H) 04/07/2023   AST 58 (H) 04/07/2023   ALT 31 04/07/2023   ANIONGAP 14 04/07/2023    CBG (last 3)  Recent Labs    04/06/23 2048 04/07/23 0829 04/07/23 1220  GLUCAP 201* 222* 210*      Coagulation Profile: Recent Labs  Lab 04/02/23 0640 04/04/23 0623 04/05/23 0501 04/06/23 0602 04/07/23 0546  INR 1.3* 1.3* 1.5* 1.5* 1.6*     Radiology Studies: DG ERCP Result Date: 04/05/2023 CLINICAL DATA:  Elective surgery Biliary dilatation concerning for choledocholithiasis EXAM: ERCP TECHNIQUE: Multiple spot images obtained with the fluoroscopic device and submitted for interpretation post-procedure. FLUOROSCOPY: Radiation Exposure Index (as provided by the fluoroscopic device): 96 mGy Kerma COMPARISON:  04/02/2023 FINDINGS: Ten intraoperative fluoroscopic images were provided for interpretation. Initial images demonstrate plastic stent in  place. Subsequent images show cannulation and opacification of the intra and extrahepatic bile ducts. No significant biliary dilatation is identified. No filling defects seen within the visualized common bile duct. Portions are obscured by the endoscope. Final image demonstrates a metallic biliary stent in place. IMPRESSION: Intraoperative cholangiogram demonstrates exchange of plastic biliary stent for metal. These images were submitted for radiologic interpretation only. Please see the procedural report for the amount of contrast and the fluoroscopy time utilized. Electronically Signed   By: Acquanetta Belling M.D.   On: 04/05/2023 19:24       Kathlen Mody M.D. [facility] 04/07/2023, 1:05 PM  Available via Epic secure chat 7am-7pm After 7 pm, please refer to night coverage provider listed on amion.

## 2023-04-08 ENCOUNTER — Encounter (HOSPITAL_COMMUNITY): Payer: Self-pay | Admitting: Internal Medicine

## 2023-04-08 ENCOUNTER — Inpatient Hospital Stay (HOSPITAL_COMMUNITY): Payer: Commercial Managed Care - PPO

## 2023-04-08 DIAGNOSIS — E1122 Type 2 diabetes mellitus with diabetic chronic kidney disease: Secondary | ICD-10-CM | POA: Diagnosis not present

## 2023-04-08 DIAGNOSIS — K7460 Unspecified cirrhosis of liver: Secondary | ICD-10-CM | POA: Diagnosis not present

## 2023-04-08 DIAGNOSIS — N1831 Chronic kidney disease, stage 3a: Secondary | ICD-10-CM | POA: Diagnosis not present

## 2023-04-08 DIAGNOSIS — N179 Acute kidney failure, unspecified: Secondary | ICD-10-CM

## 2023-04-08 DIAGNOSIS — I129 Hypertensive chronic kidney disease with stage 1 through stage 4 chronic kidney disease, or unspecified chronic kidney disease: Secondary | ICD-10-CM | POA: Diagnosis not present

## 2023-04-08 DIAGNOSIS — Z7189 Other specified counseling: Secondary | ICD-10-CM

## 2023-04-08 DIAGNOSIS — Z66 Do not resuscitate: Secondary | ICD-10-CM

## 2023-04-08 DIAGNOSIS — D649 Anemia, unspecified: Secondary | ICD-10-CM

## 2023-04-08 DIAGNOSIS — K831 Obstruction of bile duct: Secondary | ICD-10-CM | POA: Diagnosis not present

## 2023-04-08 DIAGNOSIS — E118 Type 2 diabetes mellitus with unspecified complications: Secondary | ICD-10-CM | POA: Diagnosis not present

## 2023-04-08 DIAGNOSIS — K7682 Hepatic encephalopathy: Secondary | ICD-10-CM

## 2023-04-08 DIAGNOSIS — I493 Ventricular premature depolarization: Secondary | ICD-10-CM | POA: Diagnosis not present

## 2023-04-08 DIAGNOSIS — I82409 Acute embolism and thrombosis of unspecified deep veins of unspecified lower extremity: Secondary | ICD-10-CM | POA: Diagnosis not present

## 2023-04-08 DIAGNOSIS — R4182 Altered mental status, unspecified: Secondary | ICD-10-CM | POA: Diagnosis not present

## 2023-04-08 DIAGNOSIS — N189 Chronic kidney disease, unspecified: Secondary | ICD-10-CM | POA: Diagnosis not present

## 2023-04-08 DIAGNOSIS — E872 Acidosis, unspecified: Secondary | ICD-10-CM

## 2023-04-08 DIAGNOSIS — I10 Essential (primary) hypertension: Secondary | ICD-10-CM | POA: Diagnosis not present

## 2023-04-08 DIAGNOSIS — Z515 Encounter for palliative care: Secondary | ICD-10-CM | POA: Diagnosis not present

## 2023-04-08 DIAGNOSIS — G9341 Metabolic encephalopathy: Secondary | ICD-10-CM | POA: Diagnosis not present

## 2023-04-08 DIAGNOSIS — N1832 Chronic kidney disease, stage 3b: Secondary | ICD-10-CM | POA: Diagnosis not present

## 2023-04-08 DIAGNOSIS — K7201 Acute and subacute hepatic failure with coma: Principal | ICD-10-CM

## 2023-04-08 DIAGNOSIS — K767 Hepatorenal syndrome: Secondary | ICD-10-CM | POA: Diagnosis not present

## 2023-04-08 DIAGNOSIS — K7290 Hepatic failure, unspecified without coma: Secondary | ICD-10-CM | POA: Diagnosis not present

## 2023-04-08 DIAGNOSIS — K7581 Nonalcoholic steatohepatitis (NASH): Secondary | ICD-10-CM | POA: Diagnosis not present

## 2023-04-08 DIAGNOSIS — R7989 Other specified abnormal findings of blood chemistry: Secondary | ICD-10-CM | POA: Diagnosis not present

## 2023-04-08 DIAGNOSIS — R2689 Other abnormalities of gait and mobility: Secondary | ICD-10-CM | POA: Diagnosis not present

## 2023-04-08 DIAGNOSIS — E871 Hypo-osmolality and hyponatremia: Secondary | ICD-10-CM | POA: Diagnosis not present

## 2023-04-08 DIAGNOSIS — K7200 Acute and subacute hepatic failure without coma: Secondary | ICD-10-CM | POA: Diagnosis not present

## 2023-04-08 DIAGNOSIS — E1165 Type 2 diabetes mellitus with hyperglycemia: Secondary | ICD-10-CM | POA: Diagnosis not present

## 2023-04-08 DIAGNOSIS — D696 Thrombocytopenia, unspecified: Secondary | ICD-10-CM | POA: Diagnosis not present

## 2023-04-08 HISTORY — PX: IR US GUIDE VASC ACCESS RIGHT: IMG2390

## 2023-04-08 HISTORY — PX: IR FLUORO GUIDE CV LINE RIGHT: IMG2283

## 2023-04-08 LAB — URINALYSIS, COMPLETE (UACMP) WITH MICROSCOPIC
Glucose, UA: 50 mg/dL — AB
Hgb urine dipstick: NEGATIVE
Ketones, ur: NEGATIVE mg/dL
Leukocytes,Ua: NEGATIVE
Nitrite: NEGATIVE
Protein, ur: 30 mg/dL — AB
Specific Gravity, Urine: 1.011 (ref 1.005–1.030)
pH: 5 (ref 5.0–8.0)

## 2023-04-08 LAB — PROTIME-INR
INR: 1.6 — ABNORMAL HIGH (ref 0.8–1.2)
Prothrombin Time: 18.9 s — ABNORMAL HIGH (ref 11.4–15.2)

## 2023-04-08 LAB — GLUCOSE, CAPILLARY
Glucose-Capillary: 169 mg/dL — ABNORMAL HIGH (ref 70–99)
Glucose-Capillary: 137 mg/dL — ABNORMAL HIGH (ref 70–99)
Glucose-Capillary: 151 mg/dL — ABNORMAL HIGH (ref 70–99)

## 2023-04-08 LAB — COMPREHENSIVE METABOLIC PANEL
ALT: 32 U/L (ref 0–44)
AST: 68 U/L — ABNORMAL HIGH (ref 15–41)
Albumin: 3.2 g/dL — ABNORMAL LOW (ref 3.5–5.0)
Alkaline Phosphatase: 130 U/L — ABNORMAL HIGH (ref 38–126)
Anion gap: 15 (ref 5–15)
BUN: 88 mg/dL — ABNORMAL HIGH (ref 8–23)
CO2: 13 mmol/L — ABNORMAL LOW (ref 22–32)
Calcium: 8.9 mg/dL (ref 8.9–10.3)
Chloride: 108 mmol/L (ref 98–111)
Creatinine, Ser: 5.89 mg/dL — ABNORMAL HIGH (ref 0.44–1.00)
GFR, Estimated: 7 mL/min — ABNORMAL LOW (ref >60)
Glucose, Bld: 166 mg/dL — ABNORMAL HIGH (ref 70–99)
Potassium: 5.3 mmol/L — ABNORMAL HIGH (ref 3.5–5.1)
Sodium: 136 mmol/L (ref 135–145)
Total Bilirubin: 31.7 mg/dL (ref <1.2)
Total Protein: 6 g/dL — ABNORMAL LOW (ref 6.5–8.1)

## 2023-04-08 LAB — BLOOD GAS, VENOUS
Acid-base deficit: 10.6 mmol/L — ABNORMAL HIGH (ref 0.0–2.0)
Bicarbonate: 11.4 mmol/L — ABNORMAL LOW (ref 20.0–28.0)
Drawn by: 66579
O2 Saturation: 100 %
Patient temperature: 36.6
pCO2, Ven: 18 mm[Hg] — CL (ref 44–60)
pH, Ven: 7.42 (ref 7.25–7.43)
pO2, Ven: 139 mm[Hg] — ABNORMAL HIGH (ref 32–45)

## 2023-04-08 LAB — RENAL FUNCTION PANEL
Albumin: 3.4 g/dL — ABNORMAL LOW (ref 3.5–5.0)
Anion gap: 15 (ref 5–15)
BUN: 91 mg/dL — ABNORMAL HIGH (ref 8–23)
CO2: 14 mmol/L — ABNORMAL LOW (ref 22–32)
Calcium: 9.3 mg/dL (ref 8.9–10.3)
Chloride: 107 mmol/L (ref 98–111)
Creatinine, Ser: 6.1 mg/dL — ABNORMAL HIGH (ref 0.44–1.00)
GFR, Estimated: 7 mL/min — ABNORMAL LOW (ref >60)
Glucose, Bld: 154 mg/dL — ABNORMAL HIGH (ref 70–99)
Phosphorus: 8 mg/dL — ABNORMAL HIGH (ref 2.5–4.6)
Potassium: 4.9 mmol/L (ref 3.5–5.1)
Sodium: 136 mmol/L (ref 135–145)

## 2023-04-08 LAB — CBC
HCT: 28.7 % — ABNORMAL LOW (ref 36.0–46.0)
Hemoglobin: 9.8 g/dL — ABNORMAL LOW (ref 12.0–15.0)
MCH: 29.9 pg (ref 26.0–34.0)
MCHC: 34.1 g/dL (ref 30.0–36.0)
MCV: 87.5 fL (ref 80.0–100.0)
Platelets: 66 10*3/uL — ABNORMAL LOW (ref 150–400)
RBC: 3.28 MIL/uL — ABNORMAL LOW (ref 3.87–5.11)
RDW: 18.4 % — ABNORMAL HIGH (ref 11.5–15.5)
WBC: 5.6 10*3/uL (ref 4.0–10.5)
nRBC: 0 % (ref 0.0–0.2)

## 2023-04-08 LAB — AMMONIA
Ammonia: 189 umol/L — ABNORMAL HIGH (ref 9–35)
Ammonia: 91 umol/L — ABNORMAL HIGH (ref 9–35)

## 2023-04-08 LAB — MRSA NEXT GEN BY PCR, NASAL: MRSA by PCR Next Gen: NOT DETECTED

## 2023-04-08 MED ORDER — LACTULOSE 10 GM/15ML PO SOLN
20.0000 g | Freq: Three times a day (TID) | ORAL | Status: DC
Start: 2023-04-08 — End: 2023-04-08

## 2023-04-08 MED ORDER — LACTULOSE ENEMA
300.0000 mL | Freq: Once | ORAL | Status: AC
  Administered 2023-04-08: 300 mL via RECTAL
  Filled 2023-04-08: qty 300

## 2023-04-08 MED ORDER — DEXTROSE 5 % IV SOLN
6.2500 mg/kg/h | INTRAVENOUS | Status: DC
  Filled 2023-04-08: qty 30

## 2023-04-08 MED ORDER — LACTULOSE 10 GM/15ML PO SOLN
30.0000 g | Freq: Three times a day (TID) | ORAL | Status: DC
  Administered 2023-04-08: 30 g
  Filled 2023-04-08: qty 45

## 2023-04-08 MED ORDER — HEPARIN SODIUM (PORCINE) 1000 UNIT/ML DIALYSIS
1000.0000 [IU] | INTRAMUSCULAR | Status: DC | PRN
  Filled 2023-04-08: qty 6

## 2023-04-08 MED ORDER — PRISMASOL BGK 0/2.5 32-2.5 MEQ/L EC SOLN
Status: DC
  Filled 2023-04-08 (×2): qty 5000

## 2023-04-08 MED ORDER — SODIUM CHLORIDE 0.9 % IV SOLN: INTRAVENOUS | Status: DC | PRN

## 2023-04-08 MED ORDER — PRISMASOL BGK 0/2.5 32-2.5 MEQ/L EC SOLN
Status: DC
  Filled 2023-04-08 (×4): qty 5000

## 2023-04-08 MED ORDER — LIDOCAINE HCL 1 % IJ SOLN
20.0000 mL | Freq: Once | INTRAMUSCULAR | Status: AC
  Administered 2023-04-08: 7 mL via INTRADERMAL
  Filled 2023-04-08: qty 20

## 2023-04-08 MED ORDER — DEXTROSE 5 % IV SOLN
12.5000 mg/kg/h | INTRAVENOUS | Status: DC
  Administered 2023-04-08 (×2): 12.5 mg/kg/h via INTRAVENOUS
  Filled 2023-04-08: qty 90

## 2023-04-08 MED ORDER — DEXTROSE 5 % IV SOLN
6.2500 mg/kg/h | INTRAVENOUS | Status: DC
  Filled 2023-04-08: qty 90

## 2023-04-08 MED ORDER — DEXTROSE 5 % IV SOLN
12.5000 mg/kg/h | INTRAVENOUS | Status: DC
  Filled 2023-04-08: qty 90

## 2023-04-08 MED ORDER — OXYCODONE HCL 5 MG PO TABS: 5.0000 mg | ORAL_TABLET | ORAL | Status: DC | PRN

## 2023-04-08 MED ORDER — RIFAXIMIN 550 MG PO TABS
550.0000 mg | ORAL_TABLET | Freq: Two times a day (BID) | ORAL | Status: DC
Start: 2023-04-08 — End: 2023-04-09
  Administered 2023-04-08: 550 mg
  Filled 2023-04-08: qty 1

## 2023-04-08 MED ORDER — HALOPERIDOL LACTATE 5 MG/ML IJ SOLN
1.0000 mg | Freq: Four times a day (QID) | INTRAMUSCULAR | Status: DC | PRN
  Administered 2023-04-08: 1 mg via INTRAVENOUS
  Filled 2023-04-08: qty 1

## 2023-04-08 MED ORDER — HEPARIN SODIUM (PORCINE) 1000 UNIT/ML IJ SOLN
10000.0000 [IU] | Freq: Once | INTRAMUSCULAR | Status: AC
  Administered 2023-04-08: 2600 [IU]
  Filled 2023-04-08: qty 10

## 2023-04-08 MED ORDER — DIPHENHYDRAMINE HCL 12.5 MG/5ML PO ELIX: 25.0000 mg | ORAL_SOLUTION | Freq: Four times a day (QID) | ORAL | Status: DC | PRN

## 2023-04-08 MED ORDER — LACTULOSE 10 GM/15ML PO SOLN: 30.0000 g | Freq: Once | ORAL | Status: DC

## 2023-04-08 MED ORDER — SODIUM CHLORIDE 0.9 % IV SOLN
2.0000 g | INTRAVENOUS | Status: DC
  Administered 2023-04-08: 2 g via INTRAVENOUS
  Filled 2023-04-08: qty 20

## 2023-04-08 MED ORDER — NOREPINEPHRINE 4 MG/250ML-% IV SOLN
0.0000 ug/min | INTRAVENOUS | Status: DC
  Administered 2023-04-08: 2 ug/min via INTRAVENOUS
  Filled 2023-04-08: qty 250

## 2023-04-08 MED ORDER — MIDODRINE HCL 5 MG PO TABS
15.0000 mg | ORAL_TABLET | Freq: Three times a day (TID) | ORAL | Status: DC
  Administered 2023-04-08: 15 mg
  Filled 2023-04-08: qty 3

## 2023-04-08 MED ORDER — ACETYLCYSTEINE LOAD VIA INFUSION
15000.0000 mg | Freq: Once | INTRAVENOUS | Status: DC
Start: 2023-04-08 — End: 2023-04-08
  Filled 2023-04-08: qty 492

## 2023-04-08 MED ORDER — ACETYLCYSTEINE LOAD VIA INFUSION
15000.0000 mg | Freq: Once | INTRAVENOUS | Status: AC
  Administered 2023-04-08: 15000 mg via INTRAVENOUS
  Filled 2023-04-08: qty 492

## 2023-04-08 MED ORDER — LACTULOSE 10 GM/15ML PO SOLN
20.0000 g | Freq: Three times a day (TID) | ORAL | Status: DC
Start: 2023-04-08 — End: 2023-04-08
  Filled 2023-04-08: qty 30

## 2023-04-08 MED ORDER — WHITE PETROLATUM EX OINT
TOPICAL_OINTMENT | CUTANEOUS | Status: DC | PRN
  Administered 2023-04-08: 0.2 via TOPICAL
  Filled 2023-04-08: qty 28.35

## 2023-04-08 MED ORDER — LIDOCAINE HCL 1 % IJ SOLN
INTRAMUSCULAR | Status: AC
  Filled 2023-04-08: qty 20

## 2023-04-08 MED ORDER — SODIUM BICARBONATE 650 MG PO TABS: 650.0000 mg | ORAL_TABLET | Freq: Two times a day (BID) | ORAL | Status: DC

## 2023-04-08 MED ORDER — HEPARIN SODIUM (PORCINE) 1000 UNIT/ML IJ SOLN
INTRAMUSCULAR | Status: AC
  Filled 2023-04-08: qty 10

## 2023-04-08 NOTE — Progress Notes (Signed)
[facility] Progress Note    Assessment/ Plan:   AKI on CKD3a -baseline seems to range around 1.1-1.4. 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 Received contrast on 12/18 but likely not the culprit based on timeline. No obstruction on renal ultrasound. Also received diclofenac suppositories x 2 during this admission (not sure why). Cr worsening--now up to 4.99, has yet to peak -pending GI recommendations for continuously elevated T.Bili. Stopped albumin, she has gotten adequate volume resuscitation. Started on midodrine and octreotide with no real improvement in MAPs. Agree with levophed to help with renal perfusion -appreciate IR's assistance with temp line placement. Hold off on consider HD today, plans changed to initiate CRRT. Discussed with PCCM. -all 2k bags, net uf 50cc/hr to start with -Avoid nephrotoxic medications including NSAIDs and iodinated intravenous contrast exposure unless the latter is absolutely indicated.  Preferred narcotic agents for pain control are hydromorphone, fentanyl, and methadone. Morphine should not be used. Avoid Baclofen and avoid oral sodium phosphate and magnesium citrate based laxatives / bowel preps. Continue strict Input and Output monitoring. 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 Will defer to primary service and GI to see if this is appropriate  Encephalopathy -hepatic encephalopathy, possible uremia-CRRT as above  Obstructive jaundice -s/p ERCP/EGD 12/20, s/p stent in ampulla/distal CBD -ca 19-9 232-high -t bili continues to rise despite stent exchange 12/23 -Per GI  HTN -home meds on hold, agreed.  Currently on midodrine. Levophed as above  DM2 -per primary service  Pancytopenia -per primary, likely related to cirrhosis/splenomegaly  Metabolic acidosis -Likely related to AKI and cirrhosis. On po bicarb, can stop once on HD  Edema -d/c'ed albumin. Hd as above  Hyperkalemia -CRRT as above  Hyponatremia -mild, Na up to 136 today  Discussed with husband at the bedside--agreement with plan, discussed with PCCM.   Subjective:   Patient seen and examined bedside. Husband in room. Patient now altered and somnolent. Had episodes of agitation. Moving to ICU to start levophed.   Objective:   BP (!) 135/58 (BP Location: Right Arm)   Pulse 82   Temp 97.7 F (36.5 C) (Axillary)   Resp 20   Ht 5\' 4"  (1.626 m)   Wt 105.9 kg   SpO2 99%   BMI 40.07 kg/m   Intake/Output Summary (Last 24 hours) at 04/08/2023 1242 Last data filed at 04/08/2023 0028 Gross per 24 hour  Intake 250 ml  Output 300 ml  Net -50 ml   Weight change: 0 kg  Physical Exam: Gen: ill appearing, jaundiced CVS: RRR Resp: CTA B/L Abd: soft, distended Ext: +edema b/l Les Neuro: somnolent, not following commands  Imaging: DG Chest 2 View Result Date: 04/07/2023 CLINICAL DATA:  Sepsis EXAM: CHEST - 2 VIEW COMPARISON:  None Available. FINDINGS: Shallow inspiration. Heart size and pulmonary vascularity are normal. Lungs are clear. No pleural effusions. No pneumothorax. Mediastinal contours appear intact. Degenerative changes in the spine. IMPRESSION: No active cardiopulmonary disease. Electronically Signed   By: Burman Nieves M.D.   On: 04/07/2023 19:56   DG Abd Portable 1V Result Date: 04/07/2023 CLINICAL DATA:  Nausea and vomiting. EXAM: PORTABLE ABDOMEN - 1 VIEW COMPARISON:  Abdominal radiograph dated 04/04/2023. FINDINGS: Evaluation is limited due to body  habitus. Interval removal of the biliary stent. Mildly dilated small bowel loops measure up to 4 cm in diameter. No free air. Degenerative changes  of the spine. No acute osseous pathology. IMPRESSION: Mildly dilated small bowel loops may represent ileus or partial small bowel obstruction. Electronically Signed   By: Elgie Collard M.D.   On: 04/07/2023 18:22    Labs: BMET Recent Labs  Lab 04/02/23 0640 04/03/23 0547 04/04/23 0623 04/05/23 0501 04/06/23 0602 04/07/23 0546 04/08/23 0623  NA 136 137 136 135 133* 134* 136  K 4.1 4.7 4.5 4.1 5.3* 5.0 5.3*  CL 110 109 108 109 107 106 108  CO2 19* 18* 16* 16* 13* 14* 13*  GLUCOSE 149* 261* 161* 167* 263* 232* 166*  BUN 22 35* 48* 51* 59* 72* 88*  CREATININE 1.17* 2.06* 3.13* 3.18* 3.80* 4.99* 5.89*  CALCIUM 8.3* 8.4* 8.5* 8.6* 8.9 9.1 8.9   CBC Recent Labs  Lab 04/03/23 0547 04/05/23 0501 04/06/23 0602 04/07/23 0546 04/08/23 0623  WBC 1.9* 3.4* 2.2* 5.7 5.6  NEUTROABS 1.6*  --   --   --   --   HGB 9.9* 9.4* 9.1* 9.9* 9.8*  HCT 30.4* 28.5* 28.1* 29.8* 28.7*  MCV 89.9 90.2 90.4 89.2 87.5  PLT 62* 67* 50* 76* 66*    Medications:     Chlorhexidine Gluconate Cloth  6 each Topical Q0600   insulin aspart  0-15 Units Subcutaneous TID WC   insulin aspart  0-5 Units Subcutaneous QHS   lactulose  30 g Per Tube TID   midodrine  15 mg Oral TID WC   octreotide  100 mcg Subcutaneous TID   pantoprazole (PROTONIX) IV  40 mg Intravenous QAC breakfast   rifaximin  550 mg Per Tube BID   sodium bicarbonate  650 mg Oral BID      Anthony Sar, MD [facility] 04/08/2023, 12:42 PM

## 2023-04-08 NOTE — Progress Notes (Signed)
[facility] arrived at 2000 to transport patient. Family at bedside. All patient belongings sent with family. Patient left with [facility] at 20:54. Report called to MICU, all questions answered.

## 2023-04-08 NOTE — Procedures (Signed)
Interventional Radiology Procedure Note  Procedure: Non-tunneled dialysis catheter placement; limited abd [place]  Complications: None  Estimated Blood Loss: < 10 mL  Findings: Right internal jugular 16 cm [place] temp HD catheter placed with tip at SVC/RA junction. OK to use.  US shows no significant ascites. Unable to perform paracentesis.  Jodi Marble. Fredia Sorrow, M.D Pager:  [phone]

## 2023-04-08 NOTE — Progress Notes (Signed)
KEERTHANA CLEAVES [id number] Admission Data: 04/08/2023 7:18 PM Attending Provider: Josephine Igo, DO  [id number], Marzella Schlein, MD Consults/ Treatment Team: Treatment Team:  Beverley Fiedler, MD Delano Metz, MD  Cindy Trujillo is a 67 y.o. female patient admitted to 57M room 6  - Blood pressure (!) 153/81, pulse (!) 102, temperature (!) 97.2 F (36.2 C), temperature source Oral, resp. rate 20, height 5\' 4"  (1.626 m), weight 106.2 kg, SpO2 96%.,RA, no c/o shortness of breath, no c/o chest pain, no distress noted. Tele # placed and pt is currently running:normal sinus rhythm.   IV site WDL:  forearm right, condition patent and no redness and left, condition patent and no redness with a transparent dsg that's clean dry and intact.    Skin, clean-dry- intact without evidence of bruising, or skin tears.   No evidence of skin break down noted on exam.     Will cont to monitor and assist as needed.  Phineas Douglas, [place] 04/08/2023 7:18 PM

## 2023-04-08 NOTE — Progress Notes (Signed)
[facility]                                                                               Cindy Trujillo, is a 67 y.o. female, DOB - Apr 24, 1955, [id number] Admit date - 04/01/2023    Outpatient Primary MD for the patient is Beryle Flock, Marzella Schlein, MD  LOS - 7  days    Brief summary   67yo with h/o DM, HTN, HLD, and DVT who presented to [facility] on 12/19 with abdominal pain and jaundice.  Bili 16.6.  [place] with nodular liver, trace ascites, ?varices, distended gallbladder with sludge.  CT with cirrhosis and CBD dilatation to 15 mm.  MRCP with CBD dilated to 14 mm, suspected 3 mm CBD stone.  She was transferred to [facility] for ERCP.  Surgery consulted. GI on board,  performed for EGD, ERCP on 12/20 which showed grade 2 esophageal varices with mild portal hypertensive gastropathy.  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  Pathology is negative for malignant cells. She underwent repeat ERCP for persistent hyperbilirubinemia, and repeat biopsy is negative for malignant cells. Hospital stay is significant for worsening creatinine, possible hepatorenal syndrome to the point of requiring HD. Earlier this am, pt became encephalopathic from hyperammonemia and hyperbilirubinemia. Dr Tomasa Rand reached out to [facility] at [facility] for possible transfer.  Meanwhile requested PCCM consult for transfer to ICU.    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 Unclear etiology for CBD dilatation, stone vs complicated stricture. 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 Her bili continues to worsen despite stent placement.  KUB showed stent  in place.  Repeat ERCP  on 12/23 showed malignant looking stricture. Biopsies done and results are negative for malignant cells.  Hyperbilirubinemia continues to worsen despite the stent placement.  Appreciate GI input and recommendations. Waiting to hear from [facility].  Decompensated Cirrhosis Currently on octreotide and midodrine. Completed albumin infusions. Unable to take midodrine due to encephalopathy.  Her MELD score is up at 40 today.  Requested Pccm Consult for transfer to ICU, for airway,and CCRT.    Acute Hepatic Encephalopathy from decompensated cirrhosis and hyperammonemia  Lactulose enema with 1 bm. Recheck ammonia level this afternoon.  Will place NG tube and continue with lactulose if unable to take oral.  CT head without contrast ordered for further evaluation.    Acute kidney injury on stage IIIb CKD with acute metabolic acidosis Baseline creatinine around 1.4, creatinine continues to worsen gradually to 5.89.  Concern for development of hepatorenal renal syndrome.  Nephrology consulted, plan for HD in the next 24 hours.  US kidney did not show any hydronephrosis.     Hyperkalemia Repeat k is 5.3, unable to take lokelma due to encephalopathy.  Will place NG tube if family is agreeable.    Hypertension On Midodrine.  Hyperlipidemia Crestor is on hold.   Type 2 diabetes mellitus, non insulin dependent.  Last A1c 7.5. Marland KitchenCBG (last 3)  Recent Labs    04/07/23 1741 04/07/23 2014 04/08/23 0843  GLUCAP 156* 130* 169*   Resume SSI.     History of right leg DVT Completed 6 months of eliquis.   Body mass index is 40.07 kg/m. Obesity Recommend outpatient follow up with PCP/ Bariatric surgery.    Pancytopenia From Cirrhosis.   Constipation  Resolved.   Estimated body mass index is 40.07 kg/m as calculated from the following:   Height as of this encounter: 5\' 4"  (1.626 m).   Weight as of this encounter: 105.9 kg.  Code Status:  full code.  DVT Prophylaxis:  Place and maintain sequential compression device Start: 04/01/23 1259   Level of Care: Level of care: ICU Family Communication: family at bedside.   Disposition Plan:     Remains inpatient appropriate:   decompensated cirrhosis.   Procedures:  EGD/ERCP on 12/20 ERCP ON 12/23  Consultants:   Nephrology Gastroenterology PCCM  Antimicrobials:   Anti-infectives (From admission, onward)    Start     Dose/Rate Route Frequency Ordered Stop   04/05/23 1430  ciprofloxacin (CIPRO) IVPB 400 mg        400 mg 200 mL/hr over 60 Minutes Intravenous  Once 04/05/23 0207 04/05/23 1400   04/02/23 1415  ampicillin-sulbactam (UNASYN) 1.5 g in sodium chloride 0.9 % 100 mL IVPB        1.5 g 200 mL/hr over 30 Minutes Intravenous  Once 04/02/23 1405 04/02/23 1502        Medications  Scheduled Meds:  Chlorhexidine Gluconate Cloth  6 each Topical Q0600   insulin aspart  0-15 Units Subcutaneous TID WC   insulin aspart  0-5 Units Subcutaneous QHS   midodrine  15 mg Oral TID WC   octreotide  100 mcg Subcutaneous TID   pantoprazole (PROTONIX) IV  40 mg Intravenous QAC breakfast   sodium bicarbonate  650 mg Oral BID   Continuous Infusions:   PRN Meds:.acetaminophen **OR** acetaminophen, albuterol, bisacodyl, camphor-menthol, diphenhydrAMINE, hydrALAZINE, mineral oil-hydrophilic petrolatum, ondansetron (ZOFRAN) IV, oxyCODONE, polyethylene glycol    Subjective:   Sophiamarie Navarette was seen and examined today.    Encephalopathic.   Objective:   Vitals:   04/07/23 2016 04/08/23 0339 04/08/23 0600 04/08/23 0745  BP: (!) 108/57 134/87  (!) 135/58  Pulse: 60 87  90  Resp: 18 19  18   Temp: 98.2 F (36.8 C) 97.8 F (36.6 C)  97.7 F (36.5 C)  TempSrc: Oral Axillary  Axillary  SpO2: 99% 97%    Weight:   105.9 kg   Height:        Intake/Output Summary (Last 24 hours) at 04/08/2023 1156 Last data filed at 04/08/2023 0028 Gross per 24 hour  Intake 250 ml   Output 300 ml  Net -50 ml   Filed Weights   04/06/23 0348 04/07/23 0500 04/08/23 0600  Weight: 102.7 kg 105.9 kg 105.9 kg     Exam General exam:Ill appearing elderly lady, jaundiced, encephalopathic.  Respiratory system: diminished at bases, on 2 lit of [employer] oxygen.  Cardiovascular system: S1 & S2 heard, RRR.  Gastrointestinal system: Abdomen is soft, mild tenderness. Bs+ Central nervous system: lethargic, not following commands. Able to move all extremities spontaneously.  Extremities: pedal edema present.  Skin: petechiae on arms Psychiatry: unable to assess due to altered mental status.     Data Reviewed:  I have personally reviewed following labs and imaging studies   CBC Lab Results  Component Value Date   WBC 5.6 04/08/2023   RBC 3.28 (L) 04/08/2023   HGB 9.8 (L) 04/08/2023   HCT 28.7 (L) 04/08/2023   MCV 87.5 04/08/2023   MCH 29.9 04/08/2023   PLT 66 (L) 04/08/2023   MCHC 34.1 04/08/2023   RDW 18.4 (H) 04/08/2023   LYMPHSABS 0.3 (L) 04/03/2023   MONOABS 0.1 04/03/2023   EOSABS 0.0 04/03/2023   BASOSABS 0.0 04/03/2023     Last metabolic panel Lab Results  Component Value Date   NA 136 04/08/2023   K 5.3 (H) 04/08/2023   CL 108 04/08/2023   CO2 13 (L) 04/08/2023   BUN 88 (H) 04/08/2023   CREATININE 5.89 (H) 04/08/2023   GLUCOSE 166 (H) 04/08/2023   GFRNONAA 7 (L) 04/08/2023   GFRAA 57 (L) 12/06/2019   CALCIUM 8.9 04/08/2023   PROT 6.0 (L) 04/08/2023   ALBUMIN 3.2 (L) 04/08/2023   LABGLOB 3.0 01/13/2023   AGRATIO 1.2 04/27/2022   BILITOT 31.7 (HH) 04/08/2023   ALKPHOS 130 (H) 04/08/2023   AST 68 (H) 04/08/2023   ALT 32 04/08/2023   ANIONGAP 15 04/08/2023    CBG (last 3)  Recent Labs    04/07/23 1741 04/07/23 2014 04/08/23 0843  GLUCAP 156* 130* 169*      Coagulation Profile: Recent Labs  Lab 04/04/23 0623 04/05/23 0501 04/06/23 0602 04/07/23 0546 04/08/23 0623  INR 1.3* 1.5* 1.5* 1.6* 1.6*     Radiology Studies: DG Chest 2  View Result Date: 04/07/2023 CLINICAL DATA:  Sepsis EXAM: CHEST - 2 VIEW COMPARISON:  None Available. FINDINGS: Shallow inspiration. Heart size and pulmonary vascularity are normal. Lungs are clear. No pleural effusions. No pneumothorax. Mediastinal contours appear intact. Degenerative changes in the spine. IMPRESSION: No active cardiopulmonary disease. Electronically Signed   By: Burman Nieves M.D.   On: 04/07/2023 19:56   DG Abd Portable 1V Result Date: 04/07/2023 CLINICAL DATA:  Nausea and vomiting. EXAM: PORTABLE ABDOMEN - 1 VIEW COMPARISON:  Abdominal radiograph dated 04/04/2023. FINDINGS: Evaluation is limited due to body habitus. Interval removal of the biliary stent. Mildly dilated small bowel loops measure up to 4 cm in diameter. No free air. Degenerative changes of the spine. No acute osseous pathology. IMPRESSION: Mildly dilated small bowel loops may represent ileus or partial small bowel obstruction. Electronically Signed   By: Elgie Collard M.D.   On: 04/07/2023 18:22       Kathlen Mody M.D. [facility] 04/08/2023, 11:56 AM  Available via Epic secure chat 7am-7pm After 7 pm, please refer to night coverage provider listed on amion.

## 2023-04-08 NOTE — Progress Notes (Signed)
[name], female   DOB: [birth year], 67 y.o.   MRN: [id number]   Physician Discharge Summary  [name] MRN: [id number] DOB/AGE: Jul 26, 1955 67 y.o.  Admit date: 04/01/2023 Discharge date: 04/08/2023  Problem List Principal Problem:   Obstructive jaundice Active Problems:   Class 2 obesity due to excess calories with body mass index (BMI) of 37.0 to 37.9 in adult   T2DM (type 2 diabetes mellitus) (HCC)   Hyperlipidemia associated with type 2 diabetes mellitus (HCC)   Thrombocytopenia (HCC)   Fatty liver disease, nonalcoholic   History of DVT (deep vein thrombosis)   Choledocholithiasis   Chronic kidney disease, stage 3b (HCC)   Cirrhosis, non-alcoholic (HCC)   Secondary esophageal varices without bleeding (HCC)   Portal hypertensive gastropathy (HCC)   Ampullary stenosis   Abnormal cholangiogram   Acute liver failure with hepatic coma (HCC)   Hepatorenal syndrome (HCC)   Hepatic encephalopathy (HCC)   Acute kidney injury (HCC)   Hyperbilirubinemia   DNR (do not resuscitate)   Metabolic acidosis   DNR (do not resuscitate) discussion   Anemia   Goals of care, counseling/discussion Hyperkalemia  Hyperphosphatemia  Anemia Coagulopathy  Acute encephalopathy   HPI:  67 yo F 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 Labs significant for hyperbili, elevated LFTs elevated lipase as well as AKI  imaging was c/f choledocholithiasis, possible varices, cirrhosis. The pt was transferred to [facility] for ERCP and admitted to [facility] 12/19. GI consulted for EGD and ERCP 11/20 which had grade II esophageal varices, portal hypertensive gastropathy, enlarged papilla (so ampulla biopsied), biliary stricture  which was tx with stent. 12/22 c/f stent malfunction, made NPO for repeat ERCP 12/23  again noted  abnormal papilla, stent appeared patent but removed w c/f malfunction and replaced w a metal stent. There was c/f CBD sludge.  Path from bx with no malignant cells on both 12/20 and 12/23    Nephrology was consulted 12/22 for worsening renal fxn  -- c/f HRS    12/26 pt had a decline in mental status. She has not had meaningful response to supportive efforts like albumin octreotide and midodrine. IR consulted for HD cath placement for initiation of iHD as well as for para. Ammonia elevated to 189. GI is reaching out to txp centers and has recommended ICU transfer for closer monitoring in interim.    PCCM is consulted in this setting 04/08/23   Hospital Course:  12/18 to [facility] ED w jaundice, itchiness. CT a/p and MRCP c/f  12/19 txf and admit to [facility]. GI consult  12/20 ERCP and EGD. Biliary stricture, plastic stent. Abnormal Papilla, Ampulla bx (negative)  12/22 nephrology consult for worsening AKI 12/23 repeat ERCP with concern for stent malfunction due to rising bili. Exchanged with metal stent. Repeat bx (negative) 12/26 worsening mentation. Ammonia 200 Tbili 32 worsening renal fxn. IR to place HD cath. Inadequate ascites for para, unable to complete. GI talking to txp centers. Nephro to start CRRT. PCCM is consulted for ICU transfer. Started lactulose, rifaxamin, NAC .  Accepted in transfer to [facility], Accepting physician Dr. Ebony Hail     Exam at discharge: General: Chronically ill-appearing in NAD. HEENT: [place]/AT, icteric sclera, PERRL, moist mucous membranes. Neuro: Lethargic. Responds to tactile stimuli. Not following commands. Moves all 4 extremities spontaneously.  CV: RRR, no  m/g/r. PULM: Breathing even and unlabored . Lung fields Clear. GI: Soft, Distended, + BS Extremities: No LE edema noted. Skin: Warm/dry,  Stable for transfer on 5 mcgs of levophed  Plan at discharge:  Acute encephalopathy: Hepatic encephalopathy, uremia -lactulose enemas were started 12/26. Lactulose and  rifaxin per tube were subsequently ordered.  -CRRT was started 12/26  -NPO  Decompensated liver failure, acute on chronic.  Elevated LFTs Hyperbilirubinemia Hyperammonemia / Hepatic encephalopathy Anemia Thrombocytopenia Coagulopathy Grade 2 esophageal varices Portal hypertensive gastropathy  Ampullary lesion (no malignant cells x 2 bx/brushing)  -underwent EGD and ERCP 12/20 - grade 2 esoph varices, abnormal papilla prompting ampulla biopsy & plastic stent placement for isolated biliary stricture -repeat ERCP 12/23 + stent exchange and repeat bx/brushings -- there was concern for stent malfunction as Tbili cont to rise  -Brushing/Bx x2 were without malignant cells. CA 19-9 is elevated at 232 -Despite stent & exchange, continued to have climbing Tbili, and on 12/26 had an abrupt decline in clinical status (HE, significant hyperammonemia) prompting initiation of lactulose and later ICU consult + transfer to ICU for CRRT  -MELD has ranged 38-40 of late  P -transfer to [facility]. 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     Labs at discharge Lab Results  Component Value Date   CREATININE 6.10 (H) 04/08/2023   BUN 91 (H) 04/08/2023   NA 136 04/08/2023   K 4.9 04/08/2023    CL 107 04/08/2023   CO2 14 (L) 04/08/2023   Lab Results  Component Value Date   WBC 5.6 04/08/2023   HGB 9.8 (L) 04/08/2023   HCT 28.7 (L) 04/08/2023   MCV 87.5 04/08/2023   PLT 66 (L) 04/08/2023   Lab Results  Component Value Date   ALT 32 04/08/2023   AST 68 (H) 04/08/2023   ALKPHOS 130 (H) 04/08/2023   BILITOT 31.7 (HH) 04/08/2023   Lab Results  Component Value Date   INR 1.6 (H) 04/08/2023   INR 1.6 (H) 04/07/2023   INR 1.5 (H) 04/06/2023    Current radiology studies IR Fluoro Guide CV Line Right Result Date: 04/08/2023 CLINICAL DATA:  Progressive renal failure and need for temporary non tunneled hemodialysis catheter to begin hemodialysis. EXAM: NON-TUNNELED CENTRAL VENOUS HEMODIALYSIS CATHETER PLACEMENT WITH ULTRASOUND AND FLUOROSCOPIC GUIDANCE FLUOROSCOPY: 24 seconds.  4.9 mGy. PROCEDURE: The procedure, risks, benefits, and alternatives were explained to the patient's husband. Questions regarding the procedure were encouraged and answered. The patient's husband understands and consents to the procedure. A time-out was performed prior to initiating the procedure. The right neck and chest were prepped with chlorhexidine in a sterile fashion, and a sterile drape was applied covering the operative field. Maximum barrier sterile technique with sterile gowns and gloves  were used for the procedure. Local anesthesia was provided with 1% lidocaine. Ultrasound was used to confirm patency of the right internal jugular vein. An ultrasound image was saved in recorder. After creating a small venotomy incision, a 21 gauge needle was advanced into the right internal jugular vein under direct, real-time ultrasound guidance. Ultrasound image documentation was performed. After securing guidewire access the venotomy was dilated over the wire. A 16 cm, 13 French non tunneled Mahurkar temporary hemodialysis catheter was advanced over the wire. Final catheter positioning was confirmed and documented with  a fluoroscopic spot image. The catheter was aspirated, flushed with saline, and injected with appropriate volume heparin dwells. The catheter exit site was secured with 0-Prolene retention sutures. COMPLICATIONS: None.  No pneumothorax. FINDINGS: After catheter placement, the tip lies at the cavoatrial junction. The catheter aspirates normally and is ready for immediate use. IMPRESSION: Placement of non-tunneled central venous hemodialysis catheter via the right internal jugular vein. The catheter tip lies at the cavoatrial junction. The catheter is ready for immediate use. Electronically Signed   By: Irish Lack M.D.   On: 04/08/2023 14:31   IR US Guide Vasc Access Right Result Date: 04/08/2023 CLINICAL DATA:  Progressive renal failure and need for temporary non tunneled hemodialysis catheter to begin hemodialysis. EXAM: NON-TUNNELED CENTRAL VENOUS HEMODIALYSIS CATHETER PLACEMENT WITH ULTRASOUND AND FLUOROSCOPIC GUIDANCE FLUOROSCOPY: 24 seconds.  4.9 mGy. PROCEDURE: The procedure, risks, benefits, and alternatives were explained to the patient's husband. Questions regarding the procedure were encouraged and answered. The patient's husband understands and consents to the procedure. A time-out was performed prior to initiating the procedure. The right neck and chest were prepped with chlorhexidine in a sterile fashion, and a sterile drape was applied covering the operative field. Maximum barrier sterile technique with sterile gowns and gloves were used for the procedure. Local anesthesia was provided with 1% lidocaine. Ultrasound was used to confirm patency of the right internal jugular vein. An ultrasound image was saved in recorder. After creating a small venotomy incision, a 21 gauge needle was advanced into the right internal jugular vein under direct, real-time ultrasound guidance. Ultrasound image documentation was performed. After securing guidewire access the venotomy was dilated over the wire. A 16  cm, 13 French non tunneled Mahurkar temporary hemodialysis catheter was advanced over the wire. Final catheter positioning was confirmed and documented with a fluoroscopic spot image. The catheter was aspirated, flushed with saline, and injected with appropriate volume heparin dwells. The catheter exit site was secured with 0-Prolene retention sutures. COMPLICATIONS: None.  No pneumothorax. FINDINGS: After catheter placement, the tip lies at the cavoatrial junction. The catheter aspirates normally and is ready for immediate use. IMPRESSION: Placement of non-tunneled central venous hemodialysis catheter via the right internal jugular vein. The catheter tip lies at the cavoatrial junction. The catheter is ready for immediate use. Electronically Signed   By: Irish Lack M.D.   On: 04/08/2023 14:31   IR ABDOMEN US LIMITED Result Date: 04/08/2023 CLINICAL DATA:  Patient with history of NASH cirrhosis, new onset abdominal distention and jaundice. Request for possible paracentesis. EXAM: LIMITED ABDOMEN ULTRASOUND FOR ASCITES TECHNIQUE: Limited ultrasound survey for ascites was performed in all four abdominal quadrants. Scant ascites noted in the left upper quadrant which not amenable to percutaneous drainage. No ascites seen in right upper, right lower or left lower quadrants. COMPARISON:  US abdomen limited RUQ 03/31/23 FINDINGS: Limited abdominal ultrasound with scant ascites in the left upper quadrant. IMPRESSION: Scant ascites in the left upper  quadrant which is not amenable to percutaneous drainage. No procedure performed. Performed by Lynnette Caffey, PA-C Electronically Signed   By: Irish Lack M.D.   On: 04/08/2023 14:09   CT HEAD WO CONTRAST ( ) Result Date: 04/08/2023 CLINICAL DATA:  Mental status change, unknown cause EXAM: CT HEAD WITHOUT CONTRAST TECHNIQUE: Contiguous axial images were obtained from the base of the skull through the vertex without intravenous contrast. RADIATION DOSE  REDUCTION: This exam was performed according to the departmental dose-optimization program which includes automated exposure control, adjustment of the mA and/or kV according to patient size and/or use of iterative reconstruction technique. COMPARISON:  Head CT 01/14/21 FINDINGS: Brain: No hemorrhage. No hydrocephalus. No extra-axial fluid collection. No CT evidence of an acute cortical infarct. No mass effect. No mass lesion. Vascular: No hyperdense vessel or unexpected calcification. Skull: Normal. Negative for fracture or focal lesion. Sinuses/Orbits: No middle ear or mastoid effusion. Paranasal sinuses are clear. Orbits are unremarkable. Other: None. IMPRESSION: No acute intracranial abnormality. Electronically Signed   By: Lorenza Cambridge M.D.   On: 04/08/2023 12:49   DG Chest 2 View Result Date: 04/07/2023 CLINICAL DATA:  Sepsis EXAM: CHEST - 2 VIEW COMPARISON:  None Available. FINDINGS: Shallow inspiration. Heart size and pulmonary vascularity are normal. Lungs are clear. No pleural effusions. No pneumothorax. Mediastinal contours appear intact. Degenerative changes in the spine. IMPRESSION: No active cardiopulmonary disease. Electronically Signed   By: Burman Nieves M.D.   On: 04/07/2023 19:56   DG Abd Portable 1V Result Date: 04/07/2023 CLINICAL DATA:  Nausea and vomiting. EXAM: PORTABLE ABDOMEN - 1 VIEW COMPARISON:  Abdominal radiograph dated 04/04/2023. FINDINGS: Evaluation is limited due to body habitus. Interval removal of the biliary stent. Mildly dilated small bowel loops measure up to 4 cm in diameter. No free air. Degenerative changes of the spine. No acute osseous pathology. IMPRESSION: Mildly dilated small bowel loops may represent ileus or partial small bowel obstruction. Electronically Signed   By: Elgie Collard M.D.   On: 04/07/2023 18:22    Disposition: Transfer to [facility]) to ICU      Allergies as of 04/08/2023       Reactions   Sulfa Antibiotics Hives         Medication List     STOP taking these medications    aspirin EC 81 MG tablet   cyanocobalamin 1000 MCG tablet Commonly known as: VITAMIN B12   Farxiga 10 MG Tabs tablet Generic drug: dapagliflozin propanediol   FeroSul 325 (65 Fe) MG tablet Generic drug: ferrous sulfate   fluconazole 100 MG tablet Commonly known as: Diflucan   FREESTYLE LITE test strip Generic drug: glucose blood   FreeStyle Unistick II Lancets Misc   guaiFENesin-codeine 100-10 MG/5ML syrup   guaiFENesin-dextromethorphan 100-10 MG/5ML syrup Commonly known as: ROBITUSSIN DM   lisinopril 5 MG tablet Commonly known as: ZESTRIL   Magnesium 400 MG Tabs   metFORMIN 1000 MG tablet Commonly known as: GLUCOPHAGE   omeprazole 20 MG capsule Commonly known as: PRILOSEC   rosuvastatin 5 MG tablet Commonly known as: CRESTOR   Rybelsus 3 MG Tabs Generic drug: Semaglutide   Vitamin D (Ergocalciferol) 1.25 MG (50000 UNIT) Caps capsule Commonly known as: DRISDOL   VITAMIN E PO       No current facility-administered medications on file prior to encounter.   Current Outpatient Medications on File Prior to Encounter  Medication Sig Dispense Refill   aspirin EC 81 MG tablet Take 1 tablet (81 mg total) by  mouth daily. Swallow whole. 90 tablet 0   dapagliflozin propanediol (FARXIGA) 10 MG TABS tablet Take 1 tablet (10 mg total) by mouth daily. 90 tablet 3   ferrous sulfate 325 (65 FE) MG tablet TAKE 1 TABLET BY MOUTH TWICE DAILY (Patient taking differently: Take 325 mg by mouth daily.) 60 tablet 2   guaiFENesin-codeine 100-10 MG/5ML syrup Take 10 mLs by mouth 3 (three) times daily as needed for cough. 120 mL 0   guaiFENesin-dextromethorphan (ROBITUSSIN DM) 100-10 MG/5ML syrup Take 5 mLs by mouth every 4 (four) hours as needed for cough.     lisinopril (ZESTRIL) 5 MG tablet Take 1 tablet (5 mg total) by mouth daily. 90 tablet 3   Magnesium 400 MG TABS Take 1 tablet by mouth 2 (two) times daily.     metFORMIN  (GLUCOPHAGE) 1000 MG tablet Take 1 tablet (1,000 mg total) by mouth 2 (two) times daily with a meal. 180 tablet 3   omeprazole (PRILOSEC) 20 MG capsule Take 1 capsule (20 mg total) by mouth daily. 90 capsule 3   rosuvastatin (CRESTOR) 5 MG tablet Take 1 tablet (5 mg total) by mouth daily. 90 tablet 3   Semaglutide (RYBELSUS) 3 MG TABS Take 1 tablet (3 mg total) by mouth daily. (Patient not taking: Reported on 02/23/2023) 90 tablet 1   vitamin B-12 (CYANOCOBALAMIN) 1000 MCG tablet Take 1 tablet (1,000 mcg total) by mouth daily. 90 tablet 1   VITAMIN E PO Take 1 tablet by mouth daily.     fluconazole (DIFLUCAN) 100 MG tablet Take 1 tablet (100 mg total) by mouth daily. (Patient not taking: Reported on 04/01/2023) 1 tablet 0   FreeStyle Unistick II Lancets MISC Use as instructed to check blood glucose daily 100 each 5   glucose blood (FREESTYLE TEST STRIPS) test strip Use as instructed to check blood glucose daily 100 each 12   Vitamin D, Ergocalciferol, (DRISDOL) 1.25 MG (50000 UNIT) CAPS capsule Take 1 capsule (50,000 Units total) by mouth every 7 (seven) days. (Patient not taking: Reported on 04/01/2023) 12 capsule 0    Discharged Condition: critical  Time spent on discharge greater than 40 minutes.  Vital signs at Discharge. Temp:  [97.2 F (36.2 C)-98.2 F (36.8 C)] 97.2 F (36.2 C) (12/26 1353) Pulse Rate:  [60-90] 78 (12/26 1353) Resp:  [18-23] 19 (12/26 1353) BP: (108-135)/(57-87) 122/60 (12/26 1332) SpO2:  [96 %-99 %] 96 % (12/26 1353) Weight:  [105.9 kg-106.2 kg] 106.2 kg (12/26 1353)  Chilton Greathouse MD [facility] See Amion for pager  If no response to pager , please call [phone] until 7pm After 7:00 pm call Elink  [phone] 04/08/2023, 8:17 PM

## 2023-04-08 NOTE — Plan of Care (Signed)
Limited abdominal ultrasound of all 4 quadrants shows diffuse subcutaneous edema and scant ascites which is not amenable to paracentesis at this time.  No paracentesis performed, patient to undergo trialysis catheter placement in IR next with separate procedure note to follow.   Images from today's exam available for review under imaging section of Epic.  Lynnette Caffey, PA-C

## 2023-04-08 NOTE — IPAL (Addendum)
  Interdisciplinary Goals of Care Family Meeting   Date carried out: 04/08/2023  Location of the meeting: Bedside  Member's involved: Nurse Practitioner and Family Member or next of kin  Durable Power of Attorney or Environmental health practitioner:   Husband   Discussion: We discussed goals of care for Cindy Trujillo .  I have reviewed the Advanced directive on file for Cindy Trujillo, which gives guidance regarding not prolonging life in situations that cannot be cured & would result in death within a relatively short time, if she were to become unconscious and it was felt she would not regain consciousness, and if she were to suffer from advanced dementia or another condition which significantly impacted her ability to think and was not felt to be improvable.   Resuscitative efforts and scope of intervention otherwise are not clearly spoken to.   I spoke w pts husband Cindy Trujillo (specified in AD as her healthcare agent) at bedside, with several other family members. Cindy Trujillo shared a lot of insightful information regarding their prior conversations:  -He is certain that she would never want resuscitative efforts in event of cardiac arrest. Family corroborates this as something "set in stone."   -We talked about advanced airway in situations independent of arrest, I spoke specifically to my concerns in her current state where her mental status may inhibit airway protection. Cindy Trujillo is not sure. It sounds like she has expressed against interventions like life support consistently, but they haven't spoken much about such measures in a situation like this.   -He shared that as of a few days ago she had said she doesn't think she would want a transplant, and would not want dialysis. Other family members echo this but worry that some of her motivation behind her sentiment could be financial fear.   -Cindy Trujillo shared that they have had many conversations about being comfortable if either of them had a non-curable  disease, and a large part of him thinks she might just want to be made comfortable now, but the abrupt nature of her decline makes navigating these decisions more difficult.   We talked about updating her code status -- based on this conversation I recommended DNR, with pre-arrest interventions required given the uncertainty regarding Cindy Trujillo's wishes about advanced airway independent of arrest. They agree with this.   They plan to continue talking about Cindy Trujillo's wishes as it pertains to her current state. I advised that we will continue exploring aggressive options (call is out to [facility] re txf for txp eval) and will stay in close communication about potential avenues. Similarly, if there are any clarifying decisions about what Cindy Trujillo would or would not want, they will share that with [place]. Cindy Trujillo additionally has asked that later today we talk about what the process of comfort care would entail so they can be informed of that path should a decision be reached to shift focus toward EOL care.    Code status:   Code Status: Do not attempt resuscitation (DNR) PRE-ARREST INTERVENTIONS DESIRED   Disposition: Continue current acute care  Time spent for the meeting: 27 min add'l CCT    Lanier Clam, NP  04/08/2023, 1:59 PM

## 2023-04-08 NOTE — Consult Note (Signed)
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 Labs significant for hyperbili, elevated LFTs elevated lipase as well as AKI  imaging was c/f choledocholithiasis, possible varices, cirrhosis. The pt was transferred to [facility] for ERCP and admitted to [facility] 12/19. GI consulted for EGD and ERCP 11/20 which had grade II esophageal varices, portal hypertensive gastropathy, enlarged papilla (so ampulla biopsied), biliary stricture  which was tx with stent. 12/22 c/f stent malfunction, made NPO for repeat ERCP 12/23  again noted abnormal papilla, stent appeared patent but removed w c/f malfunction and replaced w a metal stent. There was c/f CBD sludge.  Path from bx with no malignant cells on both 12/20 and 12/23   Nephrology was consulted 12/22 for worsening renal fxn  -- c/f HRS   12/26 pt had a decline in mental status. She has not had meaningful response to supportive efforts like albumin octreotide and midodrine. IR consulted for HD cath placement for initiation of iHD as well as for para. Ammonia elevated to 189. GI is reaching out to txp centers and has recommended ICU transfer for closer monitoring in interim.   PCCM is consulted in this setting   Pertinent  Medical History  DM2 DVT Endometrial ca s/p hysterectomy  HLD  Possible NASH   Significant Hospital Events: Including procedures, antibiotic start and stop dates in addition to other pertinent events   12/18 to [facility] ED w jaundice, itchiness. CT a/p and MRCP c/f  12/19 txf and admit to [facility]. GI consult  12/20 ERCP and EGD. Biliary stricture, plastic stent. Abnormal  Papilla, Ampulla bx (negative)  12/22 nephrology consult for worsening AKI 12/23 repeat ERCP with concern for stent malfunction due to rising bili. Exchanged with metal stent. Repeat bx (negative) 12/26 worsening mentation. Ammonia 200 Tbili 32 worsening renal fxn. IR to place HD cath and do para. GI talking to txp centers. Nephro to start RRT. PCCM is consulted for ICU transfer   Interim History / Subjective:  Worsening mental status overnight -- apparently was normal mentation 12/25 night when she went to sleep but around 0300 12/26 was acting erratic and agitated, was not redirectable by family and did not recognize family. 12/26 day shift has been altered, lethargic and less responsive   Ammonia just shy of 200. Tbili 32   Cr 5.9 BUN 88 serum bicarb is 13   Objective   Blood pressure 122/60, pulse 78, temperature (!) 97.2 F (36.2 C), temperature source Oral, resp. rate 19, height 5\' 4"  (1.626 m), weight 106.2 kg, SpO2 96%.        Intake/Output Summary (Last 24 hours) at 04/08/2023 1400 Last data filed at 04/08/2023 0028 Gross per 24 hour  Intake 250 ml  Output 300 ml  Net -50 ml   Filed Weights   04/07/23 0500 04/08/23 0600 04/08/23 1353  Weight: 105.9 kg 105.9 kg 106.2 kg    Examination: General: Critically ill F supine in bed  Neuro: Does not follow commands or open eyes to stimulation. Thrashes head with forced eye opening. Pupil exam very limited by eyelid resistance but appeared dilated  HENT: Icteric sclera  Lungs: even unlabored, symmetrical chest expansion  Cardiovascular: rr  Abdomen: round soft  Extremities: no acute joint deformity. Edematous   Skin: Jaundice. Red rash on torso and some of her face-- looks like spider angiomata  GU: defer  Resolved Hospital Problem list     Assessment & Plan:   Acute encephalopathy -- hepatic encephalopathy +/- uremia  P -for CT H r/o cerebral edema -HE tx as below  -moving to ICU; will need close monitoring for airway.  *notably: Family is still contemplating if she would want intubation in that setting, unless absolutely emergent please talk with husband should ETT indication arise.   -NPO  Decompensated liver failure (hx hepatic fibrosis)  Elevated LFTs  Hyperbilirubinemia  Hyperammonemia // hepatic encephalopathy  Coagulopathy, mild Anemia  Thrombocytopenia  G2 esophageal varices  Ampullary lesion (no malignant cells)  Biliary stricture (s/p stent, stent exchange)  -ERCP 12/20 w plastic stent + ampulla bx (no malignant cells), repeat ERCP 12/23 w stent exchange + bx (no malignant cells) for suspected stent malfunction -Ca19-9 is elevated (232) -despite stent exchange, bili rising and failing to improve; has declined 12/25-12/26 -MELD 40  P -transfer to ICU  -GI is following and trying to coordinate txf to txp center, awaiting call back from [facility]  -octreotide, albumin, LR, if needed NE for HRS MAP targets  -needs NGT placement for per-tube lactulose as well as continuing enema. 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 Have d/w IR and have requested trialysis cath // HD cath w add'l central access port with expected need for NE  -RRT per nephro  -NE for renal perfusion   DM2 w hyperglycemia  P -SSI  GOC DNR discussion // DNR status P -see IPAL 12/26  -family is still contemplating scope of medical interventions, but feel strongly that pt would want to be DNR.   Best Practice (right click and "Reselect all SmartList Selections" daily)   Diet/type: NPO w/ meds via tube DVT prophylaxis other Pressure ulcer(s): pressure ulcer assessment deferred  -- examined while going to IR  GI prophylaxis: PPI Lines: Dialysis Catheter Foley:   N/A Code Status:  DNR; pre arrest interventions desired  Last date of multidisciplinary goals of care discussion [12/26 ]  Labs   CBC: Recent Labs  Lab 04/03/23 0547 04/05/23 0501 04/06/23 0602 04/07/23 0546 04/08/23 0623  WBC 1.9* 3.4* 2.2* 5.7 5.6  NEUTROABS 1.6*  --   --   --   --   HGB 9.9* 9.4* 9.1* 9.9* 9.8*  HCT 30.4* 28.5* 28.1* 29.8* 28.7*  MCV 89.9 90.2 90.4 89.2 87.5  PLT 62* 67* 50* 76* 66*    Basic Metabolic Panel: Recent Labs  Lab 04/04/23 0623 04/05/23 0501 04/06/23 0602 04/07/23 0546 04/08/23 0623  NA 136 135 133* 134* 136  K 4.5 4.1 5.3* 5.0 5.3*  CL 108 109 107 106 108  CO2 16* 16* 13* 14* 13*  GLUCOSE 161* 167* 263* 232* 166*  BUN 48* 51* 59* 72* 88*  CREATININE 3.13* 3.18* 3.80* 4.99* 5.89*  CALCIUM 8.5* 8.6* 8.9 9.1 8.9   GFR: Estimated Creatinine Clearance: 11.3 mL/min (A) (by C-G formula based on SCr of 5.89 mg/dL (H)). Recent Labs  Lab 04/05/23 0501 04/06/23 0602  04/07/23 0546 04/08/23 0623  WBC 3.4* 2.2* 5.7 5.6    Liver Function Tests: Recent Labs  Lab 04/04/23 0623 04/05/23 0501 04/06/23 0602 04/07/23 0546 04/08/23 0623  AST 82* 74* 55* 58* 68*  ALT 40 32 30 31 32  ALKPHOS 174* 146* 127* 127* 130*  BILITOT 20.2* 21.3* 26.0* 29.9* 31.7*  PROT 6.2* 5.9* 6.0* 6.3* 6.0*  ALBUMIN 2.1* 2.6* 3.1* 3.4* 3.2*   Recent Labs  Lab 04/02/23 0640 04/03/23 0547  LIPASE 595* 156*   Recent Labs  Lab 04/08/23 0623  AMMONIA 189*    ABG    Component Value Date/Time   HCO3 11.4 (L) 04/08/2023 0623   ACIDBASEDEF 10.6 (H) 04/08/2023 0623   O2SAT 100 04/08/2023 0623     Coagulation Profile: Recent Labs  Lab 04/04/23 0623 04/05/23 0501 04/06/23 0602 04/07/23 0546 04/08/23 0623  INR 1.3* 1.5* 1.5* 1.6* 1.6*    Cardiac Enzymes: No results for input(s): "CKTOTAL", "CKMB", "CKMBINDEX", "TROPONINI" in the last 168 hours.  HbA1C: Hgb A1c MFr Bld  Date/Time Value Ref Range Status  01/13/2023 08:57 AM 7.5 (H) 4.8 - 5.6 %  Final    Comment:             Prediabetes: 5.7 - 6.4          Diabetes: >6.4          Glycemic control for adults with diabetes: <7.0   10/09/2022 09:29 AM 7.5 (H) 4.8 - 5.6 % Final    Comment:             Prediabetes: 5.7 - 6.4          Diabetes: >6.4          Glycemic control for adults with diabetes: <7.0     CBG: Recent Labs  Lab 04/07/23 0829 04/07/23 1220 04/07/23 1741 04/07/23 2014 04/08/23 0843  GLUCAP 222* 210* 156* 130* 169*    Review of Systems:   Unable to obtain due to degree of encephalopathy   Past Medical History:  She,  has a past medical history of Anemia, Bell's palsy, Diabetes mellitus without complication (HCC), DVT (deep venous thrombosis) (HCC) (08/2018), GERD (gastroesophageal reflux disease), Hyperlipidemia, and Post-menopausal bleeding (09/21/2019).   Surgical History:   Past Surgical History:  Procedure Laterality Date   BILIARY BRUSHING  04/02/2023   Procedure: BILIARY BRUSHING;  Surgeon: Iva Boop, MD;  Location: [facility] ENDOSCOPY;  Service: Gastroenterology;;   BILIARY STENT PLACEMENT  04/02/2023   Procedure: BILIARY STENT PLACEMENT;  Surgeon: Iva Boop, MD;  Location: [facility] ENDOSCOPY;  Service: Gastroenterology;;   BILIARY STENT PLACEMENT  04/05/2023   Procedure: BILIARY STENT PLACEMENT;  Surgeon: Meryl Dare, MD;  Location: [facility] ENDOSCOPY;  Service: Gastroenterology;;   BIOPSY  04/02/2023   Procedure: BIOPSY;  Surgeon: Iva Boop, MD;  Location: [facility] ENDOSCOPY;  Service: Gastroenterology;;   COLONOSCOPY WITH PROPOFOL N/A 08/23/2017   Procedure: COLONOSCOPY WITH PROPOFOL;  Surgeon: Scot Jun, MD;  Location: [facility] ENDOSCOPY;  Service: Endoscopy;  Laterality: N/A;   ENDOSCOPIC RETROGRADE CHOLANGIOPANCREATOGRAPHY (ERCP) WITH PROPOFOL N/A 04/05/2023   Procedure: ENDOSCOPIC RETROGRADE CHOLANGIOPANCREATOGRAPHY (ERCP) WITH PROPOFOL;  Surgeon: Meryl Dare, MD;  Location: [facility] ENDOSCOPY;  Service: Gastroenterology;  Laterality:  N/A;   ENDOVENOUS ABLATION SAPHENOUS VEIN W/ LASER Left 02/29/2020   endovenous laser ablation left greater saphenous vein and stab phlebectomy 10-20 incisions left leg by Cari Caraway MD    ERCP N/A 04/02/2023   Procedure: ENDOSCOPIC RETROGRADE CHOLANGIOPANCREATOGRAPHY (ERCP);  Surgeon: Iva Boop, MD;  Location: [facility] ENDOSCOPY;  Service: Gastroenterology;  Laterality: N/A;   ESOPHAGOGASTRODUODENOSCOPY (EGD) WITH PROPOFOL N/A 04/02/2023   Procedure: ESOPHAGOGASTRODUODENOSCOPY (EGD) WITH PROPOFOL;  Surgeon: Iva Boop, MD;  Location: [facility] ENDOSCOPY;  Service: Gastroenterology;  Laterality: N/A;   HYSTEROSCOPY WITH D & C N/A 10/31/2019   Procedure: DILATATION AND CURETTAGE /HYSTEROSCOPY WITH MYOSURE RESECTION;  Surgeon: Natale Milch, MD;  Location: [facility] ORS;  Service: Gynecology;  Laterality: N/A;   ROBOTIC ASSISTED TOTAL HYSTERECTOMY WITH BILATERAL SALPINGO OOPHERECTOMY N/A 12/06/2019   Procedure: XI ROBOTIC ASSISTED TOTAL HYSTERECTOMY WITH BILATERAL SALPINGO OOPHORECTOMY;  Surgeon: Artelia Laroche, MD;  Location: [facility] ORS;  Service: Gynecology;  Laterality: N/A;   SENTINEL NODE BIOPSY N/A 12/06/2019   Procedure: SENTINEL NODE BIOPSY;  Surgeon: Artelia Laroche, MD;  Location: [facility] ORS;  Service: Gynecology;  Laterality: N/A;   SPHINCTEROTOMY  04/02/2023   Procedure: SPHINCTEROTOMY;  Surgeon: Iva Boop, MD;  Location: [facility] ENDOSCOPY;  Service: Gastroenterology;;   Francine Graven REMOVAL  04/05/2023   Procedure: STENT REMOVAL;  Surgeon: Meryl Dare, MD;  Location: [facility] ENDOSCOPY;  Service: Gastroenterology;;   TONSILLECTOMY     TONSILLECTOMY AND ADENOIDECTOMY  1968   VEIN SURGERY  05/2019   laser     Social History:   reports that she has never smoked. She has never used smokeless tobacco. She reports that she does not drink alcohol and does not use drugs.   Family History:  Her family history includes COPD in her brother, father, mother, sister, sister, and  sister; Cancer in her father, paternal grandfather, and paternal grandmother; Fibromyalgia in her mother; Healthy in her brother; Heart attack in her maternal grandfather and paternal grandfather; Heart failure in her maternal grandmother; Melanoma in her father; Pulmonary fibrosis in her mother, sister, sister, and sister. There is no history of Breast cancer or Cervical cancer.   Allergies Allergies  Allergen Reactions   Sulfa Antibiotics Hives     Home Medications  Prior to Admission medications   Medication Sig Start Date End Date Taking? Authorizing Provider  aspirin EC 81 MG tablet Take 1 tablet (81 mg total) by mouth daily. Swallow whole. 11/15/20  Yes Rickard Patience, MD  dapagliflozin propanediol (FARXIGA) 10 MG TABS tablet Take 1 tablet (10 mg total) by mouth daily. 10/09/22  Yes Bacigalupo, Marzella Schlein, MD  ferrous sulfate 325 (65 FE) MG tablet TAKE 1 TABLET BY MOUTH TWICE DAILY Patient taking differently: Take 325 mg by mouth daily. 07/04/20 04/01/23 Yes Rickard Patience, MD  guaiFENesin-codeine 100-10 MG/5ML syrup Take 10 mLs by mouth 3 (three) times daily as needed for cough. 02/23/23  Yes Bacigalupo, Marzella Schlein, MD  guaiFENesin-dextromethorphan (ROBITUSSIN DM) 100-10 MG/5ML syrup Take 5 mLs by mouth every 4 (four) hours as needed for cough.   Yes [provider]  lisinopril (ZESTRIL) 5 MG tablet Take 1 tablet (5 mg total) by mouth daily. 10/09/22 10/09/23 Yes Bacigalupo, Marzella Schlein, MD  Magnesium 400 MG TABS Take 1 tablet by mouth 2 (two) times daily.   Yes [provider]  metFORMIN (GLUCOPHAGE) 1000 MG tablet Take 1 tablet (1,000 mg total) by mouth 2 (two) times daily with a meal. 10/09/22 10/09/23 Yes Bacigalupo, Marzella Schlein, MD  omeprazole (PRILOSEC) 20 MG capsule Take 1 capsule (20 mg total) by mouth daily. 04/16/22  Yes Bacigalupo, Marzella Schlein, MD  rosuvastatin (CRESTOR) 5 MG tablet Take 1 tablet (5 mg total) by mouth daily. 10/09/22  Yes Bacigalupo, Marzella Schlein,  MD  Semaglutide (RYBELSUS) 3 MG  TABS Take 1 tablet (3 mg total) by mouth daily. Patient not taking: Reported on 02/23/2023 01/21/23   Erasmo Downer, MD  vitamin B-12 (CYANOCOBALAMIN) 1000 MCG tablet Take 1 tablet (1,000 mcg total) by mouth daily. 03/21/19  Yes Rickard Patience, MD  VITAMIN E PO Take 1 tablet by mouth daily.   Yes [provider]  fluconazole (DIFLUCAN) 100 MG tablet Take 1 tablet (100 mg total) by mouth daily. Patient not taking: Reported on 04/01/2023 03/05/23   Sherlyn Hay, DO  FreeStyle Unistick II Lancets MISC Use as instructed to check blood glucose daily 06/26/19   Erasmo Downer, MD  glucose blood (FREESTYLE TEST STRIPS) test strip Use as instructed to check blood glucose daily 04/16/22   Erasmo Downer, MD  Vitamin D, Ergocalciferol, (DRISDOL) 1.25 MG (50000 UNIT) CAPS capsule Take 1 capsule (50,000 Units total) by mouth every 7 (seven) days. Patient not taking: Reported on 04/01/2023 05/08/22   Erasmo Downer, MD     Critical care time: 65 min      CRITICAL CARE Performed by: Lanier Clam   Total critical care time: 65 minutes  Critical care time was exclusive of separately billable procedures and treating other patients.  Critical care was necessary to treat or prevent imminent or life-threatening deterioration.  Critical care was time spent personally by me on the following activities: development of treatment plan with patient and/or surrogate as well as nursing, discussions with consultants, evaluation of patient's response to treatment, examination of patient, obtaining history from patient or surrogate, ordering and performing treatments and interventions, ordering and review of laboratory studies, ordering and review of radiographic studies, pulse oximetry and re-evaluation of patient's condition.  Tessie Fass MSN, AGACNP-BC [place] Amion for pager  04/08/2023, 2:00 PM

## 2023-04-08 NOTE — Progress Notes (Signed)
0350 Pt has new onset of confusion this AM. Per husband she sounds delirious. Complained of severe abdl pain. BP 134/87, P87, T 97.8, O2 sats. 97% in RA. Dilaudid 0.5mg  given. Liana Crocker, NP notified of this episode. No new order received. Assessed pt after 30 mins. Pt noted pt sleeping comfortably. Will continue to monitor.

## 2023-04-08 NOTE — Progress Notes (Signed)
Myself and Dr. Tonia Brooms spoke with [facility] regarding patient transfer to [facility].  Accepted under Dr. Ebony Hail, dx Acute on chronic liver failure.  Will notify [facility] of any subsequent clinical status changes in interim    Tessie Fass MSN, AGACNP-BC [facility] Pulmonary/Critical Care Medicine 04/08/2023, 5:26 PM

## 2023-04-08 NOTE — Progress Notes (Signed)
[facility] ROUNDING NOTE   Subjective: Patient developed new onset confusion and somnolence around 0300 last night.  Patient's husband states that she was at baseline when she went to sleep last night.  Ammonia level significantly elevated at 189.  No significant hemodynamic changes, afebrile.  Blood/urine Cx ordered and pending.  CXR neg.     Objective: Vital signs in last 24 hours: Temp:  [97.7 F (36.5 C)-98.2 F (36.8 C)] 97.7 F (36.5 C) (12/26 0745) Pulse Rate:  [60-90] 90 (12/26 0745) Resp:  [17-19] 18 (12/26 0745) BP: (94-135)/(47-87) 135/58 (12/26 0745) SpO2:  [97 %-99 %] 97 % (12/26 0339) Weight:  [105.9 kg] 105.9 kg (12/26 0600) Last BM Date : 04/07/23 (small after enema) General: Ill appearing Caucasian female, accompanied by husband at bedside Lungs:  CTA b/l, no w/r/r Heart:  RRR, no m/r/g Abdomen:  Soft, obese.  No tenderness elicited to palpation Ext:  Trace b/l LE edema Neuro:  Sleeping but arousable.  Not following commands or answering any questions.  Not making eye contact.    Intake/Output from previous day: 12/25 0701 - 12/26 0700 In: 250 [P.O.:200; IV Piggyback:50] Out: 400 [Urine:400] Intake/Output this shift: No intake/output data recorded.   Lab Results: Recent Labs    04/06/23 0602 04/07/23 0546 04/08/23 0623  WBC 2.2* 5.7 5.6  HGB 9.1* 9.9* 9.8*  PLT 50* 76* 66*  MCV 90.4 89.2 87.5   BMET Recent Labs    04/06/23 0602 04/07/23 0546 04/08/23 0623  NA 133* 134* 136  K 5.3* 5.0 5.3*  CL 107 106 108  CO2 13* 14* 13*  GLUCOSE 263* 232* 166*  BUN 59* 72* 88*  CREATININE 3.80* 4.99* 5.89*  CALCIUM 8.9 9.1 8.9   LFT Recent Labs    04/06/23 0602 04/07/23 0546 04/08/23 0623  PROT 6.0* 6.3* 6.0*  ALBUMIN 3.1* 3.4* 3.2*  AST 55* 58* 68*  ALT 30 31 32  ALKPHOS 127* 127* 130*  BILITOT 26.0* 29.9* 31.7*  BILIDIR 15.7*  --   --   IBILI 10.3*  --   --    PT/INR Recent Labs    04/07/23 0546 04/08/23 0623  INR  1.6* 1.6*      Imaging/Other results: DG Chest 2 View Result Date: 04/07/2023 CLINICAL DATA:  Sepsis EXAM: CHEST - 2 VIEW COMPARISON:  None Available. FINDINGS: Shallow inspiration. Heart size and pulmonary vascularity are normal. Lungs are clear. No pleural effusions. No pneumothorax. Mediastinal contours appear intact. Degenerative changes in the spine. IMPRESSION: No active cardiopulmonary disease. Electronically Signed   By: Burman Nieves M.D.   On: 04/07/2023 19:56   DG Abd Portable 1V Result Date: 04/07/2023 CLINICAL DATA:  Nausea and vomiting. EXAM: PORTABLE ABDOMEN - 1 VIEW COMPARISON:  Abdominal radiograph dated 04/04/2023. FINDINGS: Evaluation is limited due to body habitus. Interval removal of the biliary stent. Mildly dilated small bowel loops measure up to 4 cm in diameter. No free air. Degenerative changes of the spine. No acute osseous pathology. IMPRESSION: Mildly dilated small bowel loops may represent ileus or partial small bowel obstruction. Electronically Signed   By: Elgie Collard M.D.   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  Bilirubin was 16 at the time of admission.  Her bilirubin had been 1.4 two months prior. 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 After bilirubin increased following stent placement, it was presumed that the stent had become occluded or was not functioning.  She underwent a repeat ERCP with stent exchange on December 23, this time with a 10 mm x 4 cm partially covered metal stent. However, even after this stent replacement, her bilirubin has continued to rise.  In addition, she has had rapid decline in renal function, and is now approaching need for dialysis.  Nephrology has been following and feels that the patient has either hepatorenal syndrome versus bile nephropathy.  She has not had a meaningful response to albumin, octreotide and midodrine. She has experienced a significant change in her mental status, presumably secondary the hepatic encephalopathy   Acute on chronic liver failure Patient's presentation atypical and very concerning.  Patient did not have a previous diagnosis of cirrhosis prior to admission, but did have clinically significant fibrosis on biopsy a year ago.  Her bilirubin was 1.4 in October but was 16 on admission.  It was thought that the bilirubin elevation was secondary to biliary obstruction, however the bilirubin has continued to increase drastically despite 2 biliary stents.  MELD score now 40. She has likely HRS and as of this morning has developed hepatic encephalopathy, which is a very concerning development -- I reached to out [facility] this morning to update them on the new onset encephalopathy; awaiting call back - Awaiting, anti-smooth muscle, antimitochondrial antibodies, IgG levels, ANA, anti- LKM - Awaiting CMV IgG/IgM, EBV IgG/IgM - Will add CMV/EBV PCR per [facility] recs -Daily MELD labs -- Patient would likely benefit from ICU transfer for closer monitoring -- Pending culture  results   Altered mental status, likely hepatic encephalopathy -- Start lactulose enemas 300 mL TID -- Consider NGT if not improving with enemas --  Recommend CT Head to evaluate for cerebral edema    Ampullary lesion/biliary stricture - Biopsies, brushings and stent cytology results negative/nondiagnostic -Elevated CA 19-9 (232) - Patient will need endoscopic ultrasound for further evaluation - She will likely need to be sent to a tertiary institution for this, as our EUS capabilities are very limited for the next 1 to 2 months     Acute on chronic renal failure, suspect HRS versus bile nephropathy - Nephrology following, tentatively planning to start dialysis tomorrow - Creatinine increasing daily, not responsive to albumin, midodrine (increased to 15 3 times daily), started on octreotide yesterday -- Per [facility] recs, start norepinephrine drip to improve renal perfusion    Jenel Lucks, MD  04/08/2023, 9:31 AM [facility]

## 2023-04-08 NOTE — Procedures (Signed)
Brief PCCM Procedure Note   Called to bedside to assist in placement of small bore NG tube. Obtained a 28fr gastric tube and inserted it into the right nair. No stomach contents seen but able to auscultate air bubble to assess placement. Tube secured in place and STAT KUB ordered to assess location of gastric tube prior to use.   Cindy Trujillo D. Harris, NP-C [facility] Personal contact information can be found on Amion  If no contact or response made please call [phone] 04/08/2023, 6:35 PM

## 2023-04-08 NOTE — Consult Note (Signed)
Chief Complaint: Patient was seen in consultation today for temporary HD cath placement at the request of Forbach,Cory  Referring Physician(s): Loleta Rose  Supervising Physician: Irish Lack  Patient Status: [facility] - In-pt  History of Present Illness: Cindy Trujillo is a 67 y.o. female with PMHs of DVT in right leg, NASH cirrhosis, DM, currently admitted due to hyperbilirubinemia despite s/p ERCP and stent placement, AKI on CKD with uremic symptoms, IR was consulted for temp cath placement.   Patient presented to [facility] on 12/19 with RUQ pain and jaundice, CT showed cirrhosis and CBD dilatation. 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 T bili continued to raise, patient underwent repeat ERCP with stent exchange on December 23,  a 10 mm x 4 cm partially covered metal stent was placed. Unfortunately T bili continued to raise and patient developed AKI on CKD, nephrology was consulted, initiation of HD was recommended to the patient which she decided to proceed. IR was also consulted for paracentesis.   Patient seen in room.  Laying in bed sleeping, spouse and sister at bedside.  Family states that the patient became confused this morning around 3 am, she is currently sedation with medication (on haldol.)   Discussed benefit and risks of temp cath placement and paracentesis, after thorough discussion and shared decision making, family members decided to proceed. Informed consent obtained, in chart.  Family raised concern for the procedure being performed with local anesthetic only due to confusion that started this morning. Family was informed that as she has been nothing per mouth since MN, IR can offer full moderate sedation if it  neccessary.   Only obstacle is that patient does not follow command and airway cannot be evaluated for possible moderate sedation.   Per MD-pre sedation eval in chart, patient's Mallampati/airway score is two, documented on 04/05/23. It is unlikely that the Mallampati/airway score changed in 3 days, IR wll proceed with moderate sedation if necessary.   ROD not obtained due to confusion.   Past Medical History:  Diagnosis Date   Anemia    Bell's palsy    Diabetes mellitus without complication (HCC)    DVT (deep venous thrombosis) (HCC) 08/2018   right leg   GERD (gastroesophageal reflux disease)    Hyperlipidemia    Post-menopausal bleeding 09/21/2019    Past Surgical History:  Procedure Laterality Date   BILIARY BRUSHING  04/02/2023   Procedure: BILIARY BRUSHING;  Surgeon: Iva Boop, MD;  Location: [facility] ENDOSCOPY;  Service: Gastroenterology;;   BILIARY STENT PLACEMENT  04/02/2023   Procedure: BILIARY STENT PLACEMENT;  Surgeon: Iva Boop, MD;  Location: [facility] ENDOSCOPY;  Service: Gastroenterology;;   BILIARY STENT PLACEMENT  04/05/2023   Procedure: BILIARY STENT PLACEMENT;  Surgeon: Meryl Dare, MD;  Location: [facility] ENDOSCOPY;  Service: Gastroenterology;;   BIOPSY  04/02/2023   Procedure: BIOPSY;  Surgeon: Iva Boop, MD;  Location: [facility] ENDOSCOPY;  Service: Gastroenterology;;   COLONOSCOPY WITH PROPOFOL N/A 08/23/2017   Procedure: COLONOSCOPY WITH PROPOFOL;  Surgeon: Scot Jun, MD;  Location: [facility] ENDOSCOPY;  Service: Endoscopy;  Laterality: N/A;   ENDOSCOPIC RETROGRADE CHOLANGIOPANCREATOGRAPHY (ERCP) WITH PROPOFOL N/A 04/05/2023   Procedure: ENDOSCOPIC RETROGRADE CHOLANGIOPANCREATOGRAPHY (ERCP) WITH PROPOFOL;  Surgeon: Meryl Dare, MD;  Location: [facility] ENDOSCOPY;  Service: Gastroenterology;  Laterality:  N/A;   ENDOVENOUS ABLATION SAPHENOUS VEIN W/ LASER Left 02/29/2020   endovenous laser ablation left greater saphenous vein and stab phlebectomy 10-20 incisions  left leg by Cari Caraway MD    ERCP N/A 04/02/2023   Procedure: ENDOSCOPIC RETROGRADE CHOLANGIOPANCREATOGRAPHY (ERCP);  Surgeon: Iva Boop, MD;  Location: [facility] ENDOSCOPY;  Service: Gastroenterology;  Laterality: N/A;   ESOPHAGOGASTRODUODENOSCOPY (EGD) WITH PROPOFOL N/A 04/02/2023   Procedure: ESOPHAGOGASTRODUODENOSCOPY (EGD) WITH PROPOFOL;  Surgeon: Iva Boop, MD;  Location: [facility] ENDOSCOPY;  Service: Gastroenterology;  Laterality: N/A;   HYSTEROSCOPY WITH D & C N/A 10/31/2019   Procedure: DILATATION AND CURETTAGE /HYSTEROSCOPY WITH MYOSURE RESECTION;  Surgeon: Natale Milch, MD;  Location: [facility] ORS;  Service: Gynecology;  Laterality: N/A;   ROBOTIC ASSISTED TOTAL HYSTERECTOMY WITH BILATERAL SALPINGO OOPHERECTOMY N/A 12/06/2019   Procedure: XI ROBOTIC ASSISTED TOTAL HYSTERECTOMY WITH BILATERAL SALPINGO OOPHORECTOMY;  Surgeon: Artelia Laroche, MD;  Location: [facility] ORS;  Service: Gynecology;  Laterality: N/A;   SENTINEL NODE BIOPSY N/A 12/06/2019   Procedure: SENTINEL NODE BIOPSY;  Surgeon: Artelia Laroche, MD;  Location: [facility] ORS;  Service: Gynecology;  Laterality: N/A;   SPHINCTEROTOMY  04/02/2023   Procedure: SPHINCTEROTOMY;  Surgeon: Iva Boop, MD;  Location: [facility] ENDOSCOPY;  Service: Gastroenterology;;   Francine Graven REMOVAL  04/05/2023   Procedure: STENT REMOVAL;  Surgeon: Meryl Dare, MD;  Location: [facility] ENDOSCOPY;  Service: Gastroenterology;;   TONSILLECTOMY     TONSILLECTOMY AND ADENOIDECTOMY  1968   VEIN SURGERY  05/2019   laser    Allergies: Sulfa antibiotics  Medications: Prior to Admission medications   Medication Sig Start Date End Date Taking? Authorizing Provider  aspirin EC 81 MG tablet Take 1 tablet (81 mg total) by mouth daily. Swallow whole. 11/15/20  Yes Rickard Patience, MD  dapagliflozin propanediol (FARXIGA) 10 MG TABS tablet Take 1 tablet (10 mg total) by mouth daily. 10/09/22  Yes Bacigalupo, Marzella Schlein, MD  ferrous sulfate 325 (65 FE) MG tablet  TAKE 1 TABLET BY MOUTH TWICE DAILY Patient taking differently: Take 325 mg by mouth daily. 07/04/20 04/01/23 Yes Rickard Patience, MD  guaiFENesin-codeine 100-10 MG/5ML syrup Take 10 mLs by mouth 3 (three) times daily as needed for cough. 02/23/23  Yes Bacigalupo, Marzella Schlein, MD  guaiFENesin-dextromethorphan (ROBITUSSIN DM) 100-10 MG/5ML syrup Take 5 mLs by mouth every 4 (four) hours as needed for cough.   Yes [provider]  lisinopril (ZESTRIL) 5 MG tablet Take 1 tablet (5 mg total) by mouth daily. 10/09/22 10/09/23 Yes Bacigalupo, Marzella Schlein, MD  Magnesium 400 MG TABS Take 1 tablet by mouth 2 (two) times daily.   Yes [provider]  metFORMIN (GLUCOPHAGE) 1000 MG tablet Take 1 tablet (1,000 mg total) by mouth 2 (two) times daily with a meal. 10/09/22 10/09/23 Yes Bacigalupo, Marzella Schlein, MD  omeprazole (PRILOSEC) 20 MG capsule Take 1 capsule (20 mg total) by mouth daily. 04/16/22  Yes Bacigalupo, Marzella Schlein, MD  rosuvastatin (CRESTOR) 5 MG tablet Take 1 tablet (5 mg total) by mouth daily. 10/09/22  Yes Bacigalupo, Marzella Schlein, MD  Semaglutide (RYBELSUS) 3 MG TABS Take 1 tablet (3 mg total) by mouth daily. Patient not taking: Reported on 02/23/2023 01/21/23   Erasmo Downer, MD  vitamin B-12 (CYANOCOBALAMIN) 1000 MCG tablet Take 1 tablet (1,000 mcg total) by mouth daily. 03/21/19  Yes Rickard Patience, MD  VITAMIN E PO Take 1 tablet by mouth daily.   Yes [provider]  fluconazole (DIFLUCAN) 100 MG  tablet Take 1 tablet (100 mg total) by mouth daily. Patient not taking: Reported on 04/01/2023 03/05/23   Sherlyn Hay, DO  FreeStyle Unistick II Lancets MISC Use as instructed to check blood glucose daily 06/26/19   Erasmo Downer, MD  glucose blood (FREESTYLE TEST STRIPS) test strip Use as instructed to check blood glucose daily 04/16/22   Erasmo Downer, MD  Vitamin D, Ergocalciferol, (DRISDOL) 1.25 MG (50000 UNIT) CAPS capsule Take 1 capsule (50,000 Units total) by mouth every 7 (seven)  days. Patient not taking: Reported on 04/01/2023 05/08/22   Erasmo Downer, MD     Family History  Problem Relation Age of Onset   COPD Mother        heavy smoker   Pulmonary fibrosis Mother    Fibromyalgia Mother    Cancer Father    COPD Father    Melanoma Father    COPD Sister    Pulmonary fibrosis Sister    Pulmonary fibrosis Sister    COPD Sister    Pulmonary fibrosis Sister    COPD Sister    COPD Brother    Healthy Brother    Heart failure Maternal Grandmother    Heart attack Maternal Grandfather    Cancer Paternal Grandmother        unsure of type, had mets in colon   Heart attack Paternal Grandfather    Cancer Paternal Grandfather        unknown   Breast cancer Neg Hx    Cervical cancer Neg Hx     Social History   Socioeconomic History   Marital status: Married    Spouse name: Fredrik Cove   Number of children: 0   Years of education: some college   Highest education level: 12th grade  Occupational History   Occupation: CARE MANGEMENT    Employer: [employer]  Tobacco Use   Smoking status: Never   Smokeless tobacco: Never  Vaping Use   Vaping status: Never Used  Substance and Sexual Activity   Alcohol use: No    Alcohol/week: 0.0 standard drinks of alcohol   Drug use: No   Sexual activity: Not Currently    Birth control/protection: Surgical  Other Topics Concern   Not on file  Social History Narrative   Lives with Geographical information systems officer.  No pets.    Social Drivers of Corporate investment banker Strain: Low Risk  (08/17/2022)   Overall Financial Resource Strain (CARDIA)    Difficulty of Paying Living Expenses: Not hard at all  Food Insecurity: No Food Insecurity (04/01/2023)   Hunger Vital Sign    Worried About Running Out of Food in the Last Year: Never true    Ran Out of Food in the Last Year: Never true  Transportation Needs: No Transportation Needs (04/01/2023)   PRAPARE - Administrator, Civil Service (Medical): No    Lack of  Transportation (Non-Medical): No  Physical Activity: Insufficiently Active (08/17/2022)   Exercise Vital Sign    Days of Exercise per Week: 3 days    Minutes of Exercise per Session: 30 min  Stress: No Stress Concern Present (08/17/2022)   [employer] of Occupational Health - Occupational Stress Questionnaire    Feeling of Stress : Only a little  Social Connections: Unknown (08/17/2022)   Social Connection and Isolation Panel [NHANES]    Frequency of Communication with Friends and Family: More than three times a week    Frequency of Social Gatherings with  Friends and Family: Once a week    Attends Religious Services: More than 4 times per year    Active Member of Clubs or Organizations: Patient declined    Attends Engineer, structural: Not on file    Marital Status: Married     Review of Systems: A 12 point ROS discussed and pertinent positives are indicated in the HPI above.  All other systems are negative.  Vital Signs: BP (!) 135/58 (BP Location: Right Arm)   Pulse 90   Temp 97.7 F (36.5 C) (Axillary)   Resp 18   Ht 5\' 4"  (1.626 m)   Wt 233 lb 7.5 oz (105.9 kg)   SpO2 97%   BMI 40.07 kg/m    Physical Exam Vitals reviewed.  Constitutional:      General: She is not in acute distress. HENT:     Head: Normocephalic and atraumatic.  Cardiovascular:     Rate and Rhythm: Normal rate and regular rhythm.  Pulmonary:     Effort: Pulmonary effort is normal.     Breath sounds: Normal breath sounds.  Abdominal:     General: Abdomen is flat.     Palpations: Abdomen is soft.  Musculoskeletal:     Cervical back: Neck supple.  Skin:    General: Skin is warm and dry.     Coloration: Skin is jaundiced. Skin is not pale.     Comments: Patient feels very warm   Neurological:     Mental Status: She is disoriented.     MD Evaluation Airway:  (pt not able to follow command, does not open her mouth) Heart: WNL Abdomen: WNL Chest/ Lungs: WNL ASA  Classification:  3 Mallampati/Airway Score: Two  Imaging: DG Chest 2 View Result Date: 04/07/2023 CLINICAL DATA:  Sepsis EXAM: CHEST - 2 VIEW COMPARISON:  None Available. FINDINGS: Shallow inspiration. Heart size and pulmonary vascularity are normal. Lungs are clear. No pleural effusions. No pneumothorax. Mediastinal contours appear intact. Degenerative changes in the spine. IMPRESSION: No active cardiopulmonary disease. Electronically Signed   By: Burman Nieves M.D.   On: 04/07/2023 19:56   DG Abd Portable 1V Result Date: 04/07/2023 CLINICAL DATA:  Nausea and vomiting. EXAM: PORTABLE ABDOMEN - 1 VIEW COMPARISON:  Abdominal radiograph dated 04/04/2023. FINDINGS: Evaluation is limited due to body habitus. Interval removal of the biliary stent. Mildly dilated small bowel loops measure up to 4 cm in diameter. No free air. Degenerative changes of the spine. No acute osseous pathology. IMPRESSION: Mildly dilated small bowel loops may represent ileus or partial small bowel obstruction. Electronically Signed   By: Elgie Collard M.D.   On: 04/07/2023 18:22   DG ERCP Result Date: 04/05/2023 CLINICAL DATA:  Elective surgery Biliary dilatation concerning for choledocholithiasis EXAM: ERCP TECHNIQUE: Multiple spot images obtained with the fluoroscopic device and submitted for interpretation post-procedure. FLUOROSCOPY: Radiation Exposure Index (as provided by the fluoroscopic device): 96 mGy Kerma COMPARISON:  04/02/2023 FINDINGS: Ten intraoperative fluoroscopic images were provided for interpretation. Initial images demonstrate plastic stent in place. Subsequent images show cannulation and opacification of the intra and extrahepatic bile ducts. No significant biliary dilatation is identified. No filling defects seen within the visualized common bile duct. Portions are obscured by the endoscope. Final image demonstrates a metallic biliary stent in place. IMPRESSION: Intraoperative cholangiogram demonstrates exchange of  plastic biliary stent for metal. These images were submitted for radiologic interpretation only. Please see the procedural report for the amount of contrast and the fluoroscopy time utilized. Electronically  Signed   By: Acquanetta Belling M.D.   On: 04/05/2023 19:24   US RENAL Result Date: 04/05/2023 CLINICAL DATA:  Acute renal failure superimposed on stage 3 chronic kidney disease, unspecified acute renal failure type, unspecified whether stage 3a or 3b CKD (HCC) [id number] EXAM: RENAL / URINARY TRACT ULTRASOUND COMPLETE COMPARISON:  CT abdomen 03/31/2023 FINDINGS: Right Kidney: Renal measurements: 10.1 x 4.1 x 4.3 cm = volume: 94 mL. Echogenicity within normal limits. No mass or hydronephrosis visualized. Mild cortical thinning. Left Kidney: Renal measurements: 11.7 x 5.7 x 5.5 cm = volume: 190 mL. Echogenicity within normal limits. No mass or hydronephrosis visualized. Bladder: Appears normal for degree of bladder distention. Other: Perihepatic ascites. Nodular liver contour is compatible with cirrhosis. IMPRESSION: 1. No acute abnormality in the kidneys. No hydronephrosis. 2. Cirrhosis with ascites. Electronically Signed   By: Richarda Overlie M.D.   On: 04/05/2023 11:16   DG Abd Portable 1V Result Date: 04/04/2023 CLINICAL DATA:  Obstructive jaundice. EXAM: PORTABLE ABDOMEN - 1 VIEW COMPARISON:  CT scan 03/31/2023 FINDINGS: No gaseous small bowel dilatation. Common bile duct stent identified medial right abdomen. Calcified granulomata noted in the spleen. Visualized bony anatomy unremarkable. IMPRESSION: Common bile duct stent identified medial right abdomen. Electronically Signed   By: Kennith Center M.D.   On: 04/04/2023 15:12   DG ERCP Result Date: 04/02/2023 CLINICAL DATA:  ERCP EXAM: ERCP TECHNIQUE: Multiple spot images obtained with the fluoroscopic device and submitted for interpretation post-procedure. FLUOROSCOPY TIME: FLUOROSCOPY TIME 3 minutes, 50 seconds (85.9 mGy) COMPARISON:  MRCP-03/31/2023 FINDINGS:  Five spot fluoroscopic images of the right upper abdominal quadrant during ERCP are provided for review Initial image demonstrates an ERCP probe overlying the right upper abdominal quadrant Subsequent images demonstrate selective cannulation and opacification of the common bile duct which appears moderately dilated with tapered narrowing at its distal aspect Subsequent images demonstrate placement of a internal biliary stent overlying the distal aspect of the CBD. IMPRESSION: ERCP with biliary stent placement as above. These images were submitted for radiologic interpretation only. Please see the procedural report for the amount of contrast and the fluoroscopy time utilized. Electronically Signed   By: Simonne Come M.D.   On: 04/02/2023 17:54   MR 3D Recon At Scanner Result Date: 04/01/2023 CLINICAL DATA:  Intrahepatic/extrahepatic ductal dilatation, concern for choledocholithiasis EXAM: MRI ABDOMEN WITHOUT AND WITH CONTRAST (INCLUDING MRCP) TECHNIQUE: Multiplanar multisequence MR imaging of the abdomen was performed both before and after the administration of intravenous contrast. Heavily T2-weighted images of the biliary and pancreatic ducts were obtained, and three-dimensional MRCP images were rendered by post processing. CONTRAST:  10mL GADAVIST GADOBUTROL 1 MMOL/ML IV SOLN COMPARISON:  CT abdomen/pelvis dated 03/31/2023 FINDINGS: Lower chest: Lung bases are essentially clear. Hepatobiliary: Cirrhosis. No hepatic steatosis. No suspicious/enhancing hepatic lesions. Distended gallbladder, without cholelithiasis or pericholecystic inflammatory changes. Mild layering gallbladder sludge (series 21/image 87). Mild central intrahepatic ductal dilatation. Dilated common duct, measuring 14 mm. Suspected 3 mm distal CBD stone (series 4/image 25). Pancreas:  Within normal limits. Spleen:  Mild splenomegaly. Adrenals/Urinary Tract:  Adrenal glands are within normal limits. Kidneys are within normal limits.  No  hydronephrosis. Stomach/Bowel: Stomach is within normal limits. Visualized bowel is grossly unremarkable. Vascular/Lymphatic: No evidence of abdominal aortic aneurysm. Portal vein is patent. Recanalized periumbilical vein. Mild perigastric varices. No suspicious abdominal lymphadenopathy. Other:  Trace upper abdominal ascites. Musculoskeletal: No focal osseous lesions. IMPRESSION: Dilated common duct, measuring 14 mm, with suspected 3 mm distal CBD stone. Consider  ERCP. Mild layering gallbladder sludge. No associated inflammatory changes to suggest acute cholecystitis. Cirrhosis with sequela of portal hypertension, as above. No evidence of HCC. Electronically Signed   By: Charline Bills M.D.   On: 04/01/2023 22:47   MR ABDOMEN MRCP W WO CONTAST Result Date: 03/31/2023 CLINICAL DATA:  Intrahepatic/extrahepatic dilatation, concern for choledocholithiasis EXAM: MRI ABDOMEN WITHOUT AND WITH CONTRAST (INCLUDING MRCP) TECHNIQUE: Multiplanar multisequence MR imaging of the abdomen was performed both before and after the administration of intravenous contrast. Heavily T2-weighted images of the biliary and pancreatic ducts were obtained, and three-dimensional MRCP images were rendered by post processing. CONTRAST:  10mL GADAVIST GADOBUTROL 1 MMOL/ML IV SOLN COMPARISON:  CT abdomen/pelvis dated 03/31/2023. FINDINGS: Lower chest: Lung bases are essentially clear. Hepatobiliary: Cirrhosis. No hepatic steatosis. No suspicious/enhancing hepatic lesions. Distended gallbladder, without cholelithiasis or pericholecystic inflammatory changes. Mild layering gallbladder sludge (series 21/image 87). Mild central intrahepatic ductal dilatation. Dilated common duct, measuring 14 mm. Suspected 3 mm distal CBD stone (series 4/image 25). Pancreas:  Within normal limits. Spleen:  Mild splenomegaly. Adrenals/Urinary Tract:  Adrenal glands are within normal limits. Kidneys are within normal limits.  No hydronephrosis. Stomach/Bowel:  Stomach is within normal limits. Visualized bowel is grossly unremarkable. Vascular/Lymphatic: No evidence of abdominal aortic aneurysm. Portal vein is patent. Recanalized periumbilical vein. Mild perigastric varices. No suspicious abdominal lymphadenopathy. Other:  Trace upper abdominal ascites. Musculoskeletal: No focal osseous lesions. IMPRESSION: Dilated common duct, measuring 14 mm, with suspected 3 mm distal CBD stone. Consider ERCP. Mild layering gallbladder sludge. No associated inflammatory changes to suggest acute cholecystitis. Cirrhosis with sequela of portal hypertension, as above. No evidence of HCC. Electronically Signed   By: Charline Bills M.D.   On: 03/31/2023 23:21   CT ABDOMEN PELVIS W CONTRAST Result Date: 03/31/2023 CLINICAL DATA:  Nausea/vomiting, jaundice, fatigue EXAM: CT ABDOMEN AND PELVIS WITH CONTRAST TECHNIQUE: Multidetector CT imaging of the abdomen and pelvis was performed using the standard protocol following bolus administration of intravenous contrast. RADIATION DOSE REDUCTION: This exam was performed according to the departmental dose-optimization program which includes automated exposure control, adjustment of the mA and/or kV according to patient size and/or use of iterative reconstruction technique. CONTRAST:  OMNIPAQUE IOHEXOL 300 MG/ML  SOLN COMPARISON:  Right upper quadrant ultrasound dated 03/31/2023 FINDINGS: Lower chest: 9 mm calcified granuloma at the left lung base, benign. Hepatobiliary: Cirrhosis. Calcified hepatic granulomata. No suspicious hepatic lesions. Gallbladder is unremarkable. Mild central intrahepatic and extrahepatic ductal dilatation. Common duct measuring up to 15 mm (coronal image 2). Pancreas: Within normal limits. Spleen: Enlarged, measuring 15.9 cm in maximal craniocaudal dimension. Calcified splenic granulomata. Adrenals/Urinary Tract: Adrenal glands are within normal limits. Left kidney is within normal limits. Punctate nonobstructing  right upper pole renal calculus (series 2/image 28). No hydronephrosis. Bladder is within normal limits. Stomach/Bowel: Stomach is within normal limits. No evidence of bowel obstruction. Normal appendix (series 2/image 6). Left colonic diverticulosis, without evidence of diverticulitis. Vascular/Lymphatic: No evidence of abdominal aortic aneurysm. Atherosclerotic calcifications of the abdominal aorta and branch vessels, although vessels remain patent. Portal vein is patent. Recanalized periumbilical vein. Mild perigastric varices. No suspicious abdominopelvic lymphadenopathy. Reproductive: Status post hysterectomy. No adnexal masses. Other: Trace upper abdominal and pelvic ascites. Musculoskeletal: Mild degenerative changes of the visualized thoracolumbar spine. IMPRESSION: Cirrhosis.  No findings suspicious for HCC Mild central intrahepatic and extrahepatic ductal dilatation. Common duct measuring up to 15 mm. In the setting of abnormal LFTs, consider ERCP or MRCP for further evaluation, as clinically warranted.  Additional ancillary findings as above. Electronically Signed   By: Charline Bills M.D.   On: 03/31/2023 21:08   US Abdomen Limited RUQ (LIVER/GB) Result Date: 03/31/2023 CLINICAL DATA:  Pain and nausea and vomiting for 2 weeks.  Jaundice EXAM: ULTRASOUND ABDOMEN LIMITED RIGHT UPPER QUADRANT COMPARISON:  Ultrasound 06/10/2021. FINDINGS: Gallbladder: Dilated gallbladder with sludge. No wall thickening or adjacent fluid. No shadowing stones. No reported sonographic Murphy's sign. Common bile duct: Diameter: 7 mm Liver: Heterogeneous liver with some nodular contours. Please correlate for any history of liver disease. There is some intrahepatic biliary ductal dilatation. Portal vein is patent on color Doppler imaging with normal direction of blood flow towards the liver. Other: Possible varices.  Trace ascites IMPRESSION: Nodular heterogeneous liver. Question some varices. Trace ascites suggested.  Distended gallbladder with sludge. Mild biliary duct ectasia. Recommend further workup with CT to further delineate these findings when appropriate Electronically Signed   By: Karen Kays M.D.   On: 03/31/2023 15:26    Labs:  CBC: Recent Labs    04/05/23 0501 04/06/23 0602 04/07/23 0546 04/08/23 0623  WBC 3.4* 2.2* 5.7 5.6  HGB 9.4* 9.1* 9.9* 9.8*  HCT 28.5* 28.1* 29.8* 28.7*  PLT 67* 50* 76* 66*    COAGS: Recent Labs    03/31/23 1924 04/01/23 1142 04/05/23 0501 04/06/23 0602 04/07/23 0546 04/08/23 0623  INR 1.3*   < > 1.5* 1.5* 1.6* 1.6*  APTT 32  --   --   --   --   --    < > = values in this interval not displayed.    BMP: Recent Labs    04/05/23 0501 04/06/23 0602 04/07/23 0546 04/08/23 0623  NA 135 133* 134* 136  K 4.1 5.3* 5.0 5.3*  CL 109 107 106 108  CO2 16* 13* 14* 13*  GLUCOSE 167* 263* 232* 166*  BUN 51* 59* 72* 88*  CALCIUM 8.6* 8.9 9.1 8.9  CREATININE 3.18* 3.80* 4.99* 5.89*  GFRNONAA 16* 13* 9* 7*    LIVER FUNCTION TESTS: Recent Labs    04/05/23 0501 04/06/23 0602 04/07/23 0546 04/08/23 0623  BILITOT 21.3* 26.0* 29.9* 31.7*  AST 74* 55* 58* 68*  ALT 32 30 31 32  ALKPHOS 146* 127* 127* 130*  PROT 5.9* 6.0* 6.3* 6.0*  ALBUMIN 2.6* 3.1* 3.4* 3.2*    TUMOR MARKERS: No results for input(s): "AFPTM", "CEA", "CA199", "CHROMGRNA" in the last 8760 hours.  Assessment and Plan: 67 y.o. female with hyperbilirubinemia despite biliary stent placement x 2 by GI, NASH cirrhosis with abdominal distention, and AKI on CKD with uremic symptoms who presents for temporary HD cath and paracentesis.   NPO since MN VSS CBC stable, no leukocytosis, hgb 9.8, plt 66 T bili raising, 31.7 RF worsening  INR 1.6   Risks and benefits discussed with the patient's family members including, but not limited to bleeding, infection, and damage to adjacent structures.   All of the family members' questions were answered, patient is agreeable to proceed. Consent  signed and in chart.   Thank you for this interesting consult.  I greatly enjoyed meeting Joelle A Vollmer and look forward to participating in their care.  A copy of this report was sent to the requesting provider on this date.  Electronically Signed: Willette Brace, PA-C 04/08/2023, 9:40 AM   I spent a total of 40 Minutes    in face to face in clinical consultation, greater than 50% of which was counseling/coordinating care for temp cath  placement and paracentesis.   This chart was dictated using voice recognition software.  Despite best efforts to proofread,  errors can occur which can change the documentation meaning.

## 2023-04-09 DIAGNOSIS — K7460 Unspecified cirrhosis of liver: Secondary | ICD-10-CM | POA: Diagnosis not present

## 2023-04-09 DIAGNOSIS — K7290 Hepatic failure, unspecified without coma: Secondary | ICD-10-CM | POA: Diagnosis not present

## 2023-04-09 DIAGNOSIS — N179 Acute kidney failure, unspecified: Secondary | ICD-10-CM | POA: Diagnosis not present

## 2023-04-09 DIAGNOSIS — R7989 Other specified abnormal findings of blood chemistry: Secondary | ICD-10-CM | POA: Diagnosis not present

## 2023-04-09 LAB — CMV DNA, QUANTITATIVE, PCR
CMV DNA Quant: NEGATIVE [IU]/mL
Log10 CMV Qn DNA Pl: UNDETERMINED {Log}

## 2023-04-09 LAB — EPSTEIN-BARR VIRUS VCA, IGM: EBV VCA IgM: <36 U/mL (ref 0.0–35.9)

## 2023-04-09 LAB — CMV ANTIBODY, IGG (EIA): CMV Ab - IgG: <0.6 U/mL (ref 0.00–0.59)

## 2023-04-09 LAB — ANTI-MICROSOMAL ANTIBODY LIVER / KIDNEY: LKM1 Ab: 0.9 U (ref 0.0–20.0)

## 2023-04-09 LAB — ANA: Anti Nuclear Antibody (ANA): NEGATIVE

## 2023-04-09 LAB — MITOCHONDRIAL ANTIBODIES: Mitochondrial M2 Ab, IgG: <20 U (ref 0.0–20.0)

## 2023-04-09 LAB — CMV IGM: CMV IgM: <30 [AU]/ml (ref 0.0–29.9)

## 2023-04-09 LAB — ANTI-SMOOTH MUSCLE ANTIBODY, IGG: F-Actin IgG: 12 U (ref 0–19)

## 2023-04-09 LAB — URINE CULTURE: Culture: NO GROWTH

## 2023-04-09 LAB — ALPHA-1-ANTITRYPSIN: A-1 Antitrypsin, Ser: 143 mg/dL (ref 101–187)

## 2023-04-09 LAB — EPSTEIN-BARR VIRUS VCA, IGG: EBV VCA IgG: >600 U/mL — ABNORMAL HIGH (ref 0.0–17.9)

## 2023-04-09 LAB — HEPATITIS B SURFACE ANTIBODY, QUANTITATIVE: Hep B S AB Quant (Post): <3.5 m[IU]/mL — ABNORMAL LOW

## 2023-04-09 LAB — EPSTEIN BARR VRS(EBV DNA BY PCR): EBV DNA QN by PCR: NEGATIVE [IU]/mL

## 2023-04-09 LAB — IGG: IgG (Immunoglobin G), Serum: 1122 mg/dL (ref 586–1602)

## 2023-04-09 LAB — HEMOGLOBIN A1C
Hgb A1c MFr Bld: 6.7 % — ABNORMAL HIGH (ref 4.8–5.6)
Mean Plasma Glucose: 146 mg/dL

## 2023-04-12 ENCOUNTER — Encounter: Payer: Self-pay | Admitting: Oncology

## 2023-04-12 ENCOUNTER — Other Ambulatory Visit: Payer: Self-pay

## 2023-04-12 LAB — CULTURE, BLOOD (ROUTINE X 2)
Culture: NO GROWTH
Culture: NO GROWTH
Special Requests: ADEQUATE

## 2023-04-12 MED ORDER — HYOSCYAMINE SULFATE 0.125 MG SL SUBL
0.1250 mg | SUBLINGUAL_TABLET | SUBLINGUAL | 2 refills | Status: DC | PRN
  Filled 2023-04-12: qty 56, 10d supply, fill #0

## 2023-04-12 MED ORDER — ONDANSETRON HCL 4 MG PO TABS
4.0000 mg | ORAL_TABLET | ORAL | 2 refills | Status: DC | PRN
  Filled 2023-04-12: qty 84, 14d supply, fill #0

## 2023-04-12 MED ORDER — MORPHINE SULFATE (CONCENTRATE) 10 MG /0.5 ML PO SOLN
5.0000 mg | ORAL | 0 refills | Status: DC | PRN
  Filled 2023-04-12: qty 30, 20d supply, fill #0

## 2023-04-12 MED ORDER — LORAZEPAM 0.5 MG PO TABS
0.5000 mg | ORAL_TABLET | ORAL | 0 refills | Status: DC | PRN
  Filled 2023-04-12: qty 84, 14d supply, fill #0

## 2023-04-13 ENCOUNTER — Encounter: Payer: Self-pay | Admitting: Oncology

## 2023-04-13 ENCOUNTER — Other Ambulatory Visit: Payer: Self-pay

## 2023-04-15 ENCOUNTER — Ambulatory Visit: Payer: Commercial Managed Care - PPO | Admitting: Family Medicine

## 2023-04-16 ENCOUNTER — Other Ambulatory Visit: Payer: Self-pay

## 2023-04-19 ENCOUNTER — Other Ambulatory Visit: Payer: Self-pay

## 2023-04-19 MED ORDER — HYDROXYZINE HCL 25 MG PO TABS
25.0000 mg | ORAL_TABLET | Freq: Four times a day (QID) | ORAL | 1 refills | Status: DC | PRN
  Filled 2023-04-19: qty 56, 14d supply, fill #0

## 2023-04-19 MED ORDER — HYDROCORTISONE 0.5 % EX CREA
TOPICAL_CREAM | CUTANEOUS | 1 refills | Status: DC
  Filled 2023-04-19: qty 28.4, 5d supply, fill #0

## 2023-04-20 ENCOUNTER — Other Ambulatory Visit: Payer: Self-pay

## 2023-04-21 ENCOUNTER — Other Ambulatory Visit: Payer: Self-pay

## 2023-04-21 MED ORDER — TRAMADOL HCL 50 MG PO TABS: 50.0000 mg | ORAL_TABLET | Freq: Four times a day (QID) | ORAL | 1 refills | Status: DC | PRN

## 2023-04-21 MED ORDER — TRAMADOL HCL 50 MG PO TABS
50.0000 mg | ORAL_TABLET | Freq: Four times a day (QID) | ORAL | 1 refills | Status: DC | PRN
  Filled 2023-04-21 (×2): qty 56, 14d supply, fill #0

## 2023-04-23 ENCOUNTER — Other Ambulatory Visit: Payer: Self-pay

## 2023-04-23 MED ORDER — FLUCONAZOLE 150 MG PO TABS
150.0000 mg | ORAL_TABLET | Freq: Once | ORAL | 0 refills | Status: AC
  Filled 2023-04-23: qty 1, 1d supply, fill #0

## 2023-04-28 ENCOUNTER — Other Ambulatory Visit: Payer: Self-pay

## 2023-05-04 NOTE — Discharge Summary (Signed)
[name], female   DOB: [birth year], 68 y.o.   MRN: [id number]   Physician Discharge Summary  [name] MRN: [id number] DOB/AGE: 04-03-1956 68 y.o.  Admit date: 04/01/2023 Discharge date: 05/04/2023  Problem List Principal Problem:   Obstructive jaundice Active Problems:   Class 2 obesity due to excess calories with body mass index (BMI) of 37.0 to 37.9 in adult   T2DM (type 2 diabetes mellitus) (HCC)   Hyperlipidemia associated with type 2 diabetes mellitus (HCC)   Thrombocytopenia (HCC)   Fatty liver disease, nonalcoholic   History of DVT (deep vein thrombosis)   Choledocholithiasis   Chronic kidney disease, stage 3b (HCC)   Cirrhosis, non-alcoholic (HCC)   Secondary esophageal varices without bleeding (HCC)   Portal hypertensive gastropathy (HCC)   Ampullary stenosis   Abnormal cholangiogram   Acute liver failure with hepatic coma (HCC)   Hepatorenal syndrome (HCC)   Hepatic encephalopathy (HCC)   Acute kidney injury (HCC)   Hyperbilirubinemia   DNR (do not resuscitate)   Metabolic acidosis   DNR (do not resuscitate) discussion   Anemia   Goals of care, counseling/discussion Hyperkalemia  Hyperphosphatemia  Anemia Coagulopathy  Acute encephalopathy   HPI:  68 yo F PMH NASH/suspected cirrhosis, thrombocytopenia, HLD, DM2, endometrial ca s/p hysterectomy in 2021, DVT (2020) not on [facility], who presented to [facility] ED 12/28 w n/v/ jaundice + associated clay stools and itchiness. Labs significant for hyperbili, elevated LFTs elevated lipase as well as AKI  imaging was c/f choledocholithiasis, possible varices, cirrhosis. The pt was transferred to [facility] for ERCP and admitted to [facility] 12/19. GI consulted for EGD and ERCP 11/20 which had grade II esophageal varices, portal hypertensive gastropathy, enlarged papilla (so ampulla biopsied), biliary stricture  which was tx with stent. 12/22 c/f stent malfunction, made NPO for repeat ERCP 12/23  again noted  abnormal papilla, stent appeared patent but removed w c/f malfunction and replaced w a metal stent. There was c/f CBD sludge.  Path from bx with no malignant cells on both 12/20 and 12/23    Nephrology was consulted 12/22 for worsening renal fxn  -- c/f HRS    12/26 pt had a decline in mental status. She has not had meaningful response to supportive efforts like albumin octreotide and midodrine. IR consulted for HD cath placement for initiation of iHD as well as for para. Ammonia elevated to 189. GI is reaching out to txp centers and has recommended ICU transfer for closer monitoring in interim.    PCCM is consulted in this setting 04/08/23   Hospital Course:  12/18 to [facility] ED w jaundice, itchiness. CT a/p and MRCP c/f  12/19 txf and admit to [facility]. GI consult  12/20 ERCP and EGD. Biliary stricture, plastic stent. Abnormal Papilla, Ampulla bx (negative)  12/22 nephrology consult for worsening AKI 12/23 repeat ERCP with concern for stent malfunction due to rising bili. Exchanged with metal stent. Repeat bx (negative) 12/26 worsening mentation. Ammonia 200 Tbili 32 worsening renal fxn. IR to place HD cath. Inadequate ascites for para, unable to complete. GI talking to txp centers. Nephro to start CRRT. PCCM is consulted for ICU transfer. Started lactulose, rifaxamin, NAC .  Accepted in transfer to [facility], Accepting physician Dr. Ebony Hail     Exam at discharge: General: Chronically ill-appearing in NAD. HEENT: /AT, icteric sclera, PERRL, moist mucous membranes. Neuro: Lethargic. Responds to tactile stimuli. Not following commands. Moves all 4 extremities spontaneously.  CV: RRR, no  m/g/r. PULM: Breathing even and unlabored . Lung fields Clear. GI: Soft, Distended, + BS Extremities: No LE edema noted. Skin: Warm/dry,  Stable for transfer on 5 mcgs of levophed  Plan at discharge:  Acute encephalopathy: Hepatic encephalopathy, uremia -lactulose enemas were started 12/26. Lactulose and  rifaxin per tube were subsequently ordered.  -CRRT was started 12/26  -NPO  Decompensated liver failure, acute on chronic.  Elevated LFTs Hyperbilirubinemia Hyperammonemia / Hepatic encephalopathy Anemia Thrombocytopenia Coagulopathy Grade 2 esophageal varices Portal hypertensive gastropathy  Ampullary lesion (no malignant cells x 2 bx/brushing)  -underwent EGD and ERCP 12/20 - grade 2 esoph varices, abnormal papilla prompting ampulla biopsy & plastic stent placement for isolated biliary stricture -repeat ERCP 12/23 + stent exchange and repeat bx/brushings -- there was concern for stent malfunction as Tbili cont to rise  -Brushing/Bx x2 were without malignant cells. CA 19-9 is elevated at 232 -Despite stent & exchange, continued to have climbing Tbili, and on 12/26 had an abrupt decline in clinical status (HE, significant hyperammonemia) prompting initiation of lactulose and later ICU consult + transfer to ICU for CRRT  -MELD has ranged 38-40 of late  P -transfer to [facility]. 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     Labs at discharge Lab Results  Component Value Date   CREATININE 6.10 (H) 04/08/2023   BUN 91 (H) 04/08/2023   NA 136 04/08/2023   K 4.9 04/08/2023    CL 107 04/08/2023   CO2 14 (L) 04/08/2023   Lab Results  Component Value Date   WBC 5.6 04/08/2023   HGB 9.8 (L) 04/08/2023   HCT 28.7 (L) 04/08/2023   MCV 87.5 04/08/2023   PLT 66 (L) 04/08/2023   Lab Results  Component Value Date   ALT 32 04/08/2023   AST 68 (H) 04/08/2023   ALKPHOS 130 (H) 04/08/2023   BILITOT 31.7 (HH) 04/08/2023   Lab Results  Component Value Date   INR 1.6 (H) 04/08/2023   INR 1.6 (H) 04/07/2023   INR 1.5 (H) 04/06/2023    Current radiology studies No results found.   Disposition: Transfer to [facility]) to ICU   Discharge disposition: 02-Transferred to [facility]       Discharge Instructions     Call MD for:  difficulty breathing, headache or visual disturbances   Complete by: As directed    Call MD for:  extreme fatigue   Complete by: As directed    Call MD for:  hives   Complete by: As directed    Call MD for:  persistant dizziness or light-headedness   Complete by: As directed    Call MD for:  persistant nausea and vomiting   Complete by: As directed  Call MD for:  redness, tenderness, or signs of infection (pain, swelling, redness, odor or green/yellow discharge around incision site)   Complete by: As directed    Call MD for:  severe uncontrolled pain   Complete by: As directed    Call MD for:  temperature >100.4   Complete by: As directed       Allergies as of 04/08/2023       Reactions   Sulfa Antibiotics Hives        Medication List     STOP taking these medications    aspirin EC 81 MG tablet   cyanocobalamin 1000 MCG tablet Commonly known as: VITAMIN B12   Farxiga 10 MG Tabs tablet Generic drug: dapagliflozin propanediol   FeroSul 325 (65 Fe) MG tablet Generic drug: ferrous sulfate   fluconazole 100 MG tablet Commonly known as: Diflucan   FREESTYLE LITE test strip Generic drug: glucose blood   FreeStyle Unistick II Lancets Misc   guaiFENesin-codeine 100-10 MG/5ML syrup    guaiFENesin-dextromethorphan 100-10 MG/5ML syrup Commonly known as: ROBITUSSIN DM   lisinopril 5 MG tablet Commonly known as: ZESTRIL   Magnesium 400 MG Tabs   metFORMIN 1000 MG tablet Commonly known as: GLUCOPHAGE   omeprazole 20 MG capsule Commonly known as: PRILOSEC   rosuvastatin 5 MG tablet Commonly known as: CRESTOR   Rybelsus 3 MG Tabs Generic drug: Semaglutide   Vitamin D (Ergocalciferol) 1.25 MG (50000 UNIT) Caps capsule Commonly known as: DRISDOL   VITAMIN E PO       No current facility-administered medications on file prior to encounter.   No current outpatient medications on file prior to encounter.    Discharged Condition: critical  Time spent on discharge greater than 40 minutes.  Vital signs at Discharge.    Chilton Greathouse MD [facility] See Amion for pager  If no response to pager , please call [phone] until 7pm After 7:00 pm call Elink  [phone] 05/04/2023, 1:56 PM

## 2023-05-04 NOTE — ED Notes (Signed)
This RN took a call from niece Victorino Dike) who states pt lost a wallet during stay here on 12/19, Victorino Dike states multiple calls with no return phone calls.  This RN contacted pt advocate who checked lost and found and this RN also contacted security who looked in safe and did not find it either.  This RN contacted Victorino Dike back and left a voicemail making it aware wallet was not found and if any questions to call charge RN back and number provided.

## 2023-05-15 DEATH — deceased

## 2023-07-13 IMAGING — CT CT HEAD W/O CM
3 series · 16 of 47 positions shown, 19 images · non-contrast
Comparison: None.

CLINICAL DATA: Headache.  Left facial droop and numbness.

EXAM:
CT HEAD WITHOUT CONTRAST
TECHNIQUE: Contiguous axial images were obtained from the base of the skull
through the vertex without intravenous contrast.

[Series 2: head wo · axial · 0.41mm/px · z∈[-143,-18]mm · 10 of 30 slices shown, 13 images]
[im 3/30  brain]
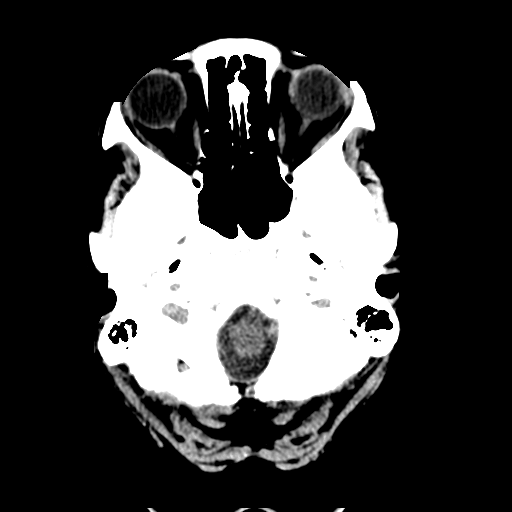
[im 3/30  bone]
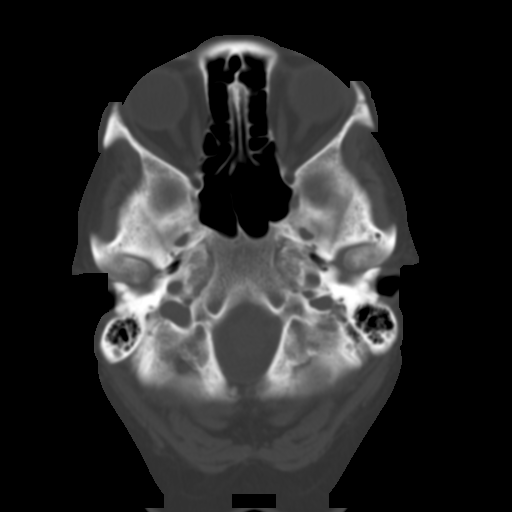
[im 6/30  brain]
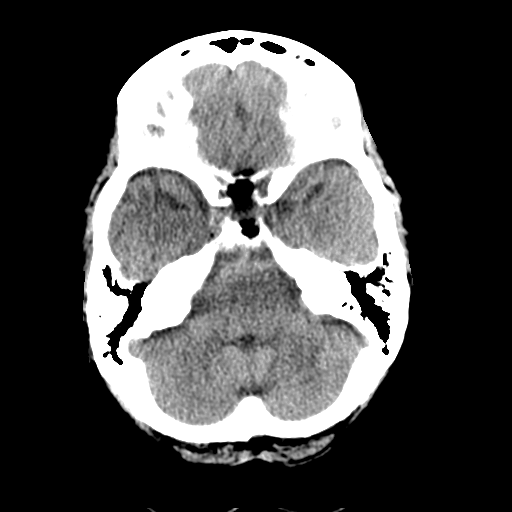
[im 9/30  brain]
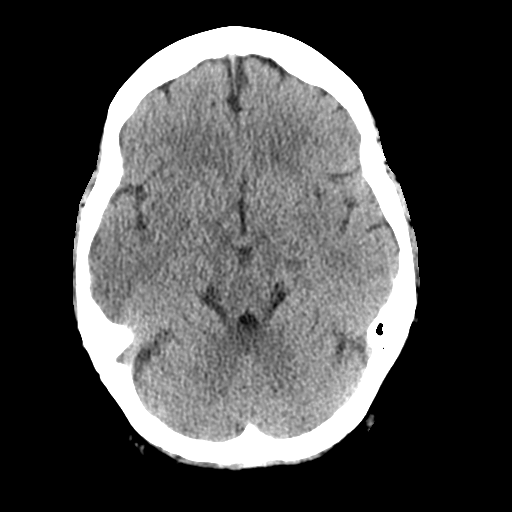
[im 11/30  brain]
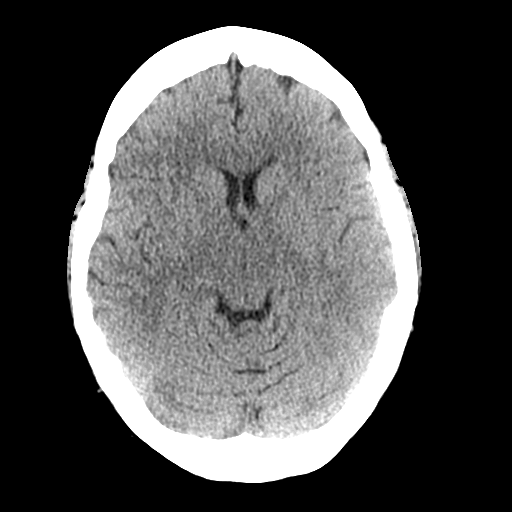
[im 14/30  brain]
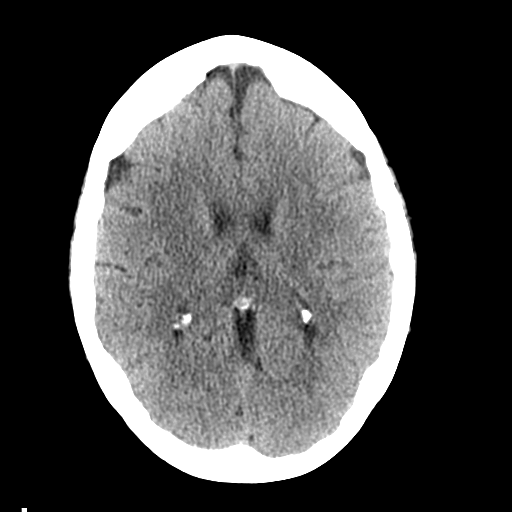
[im 14/30  bone]
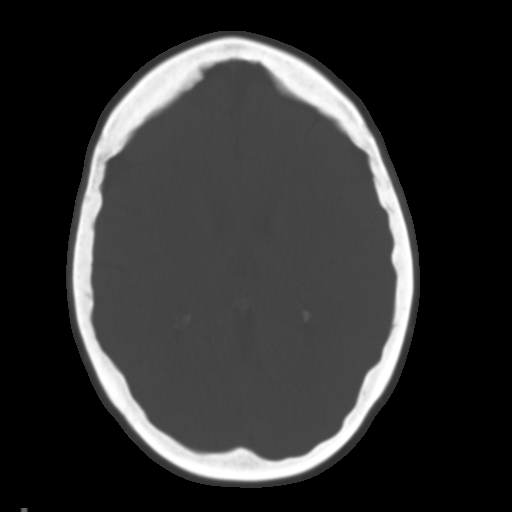
[im 17/30  brain]
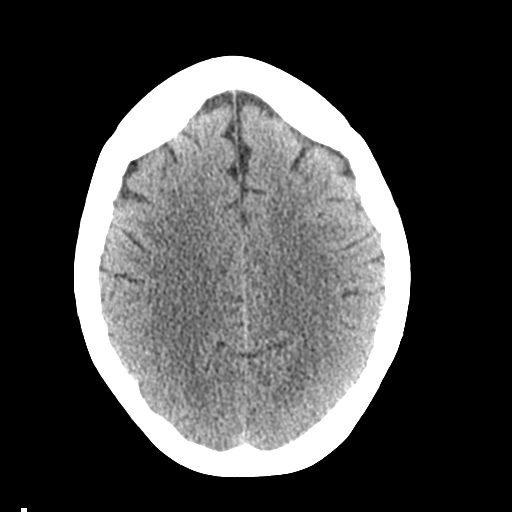
[im 20/30  brain]
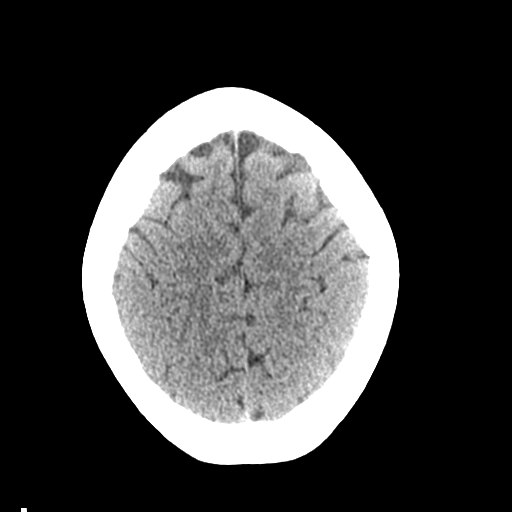
[im 23/30  brain]
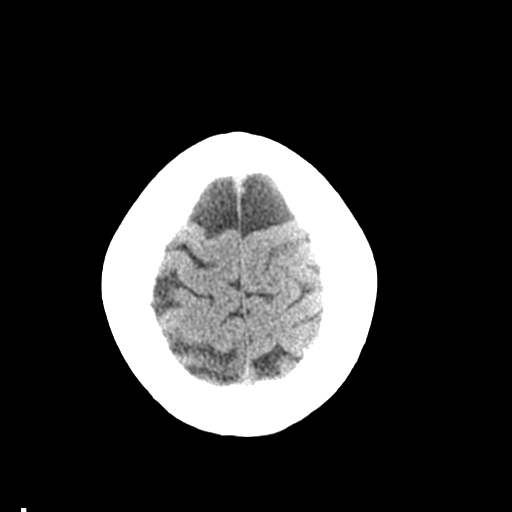
[im 25/30  brain]
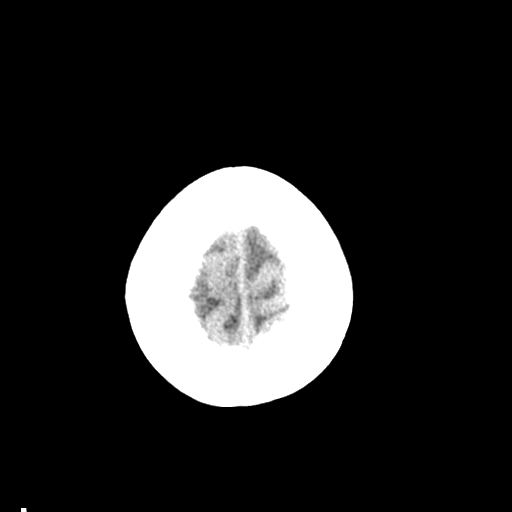
[im 25/30  bone]
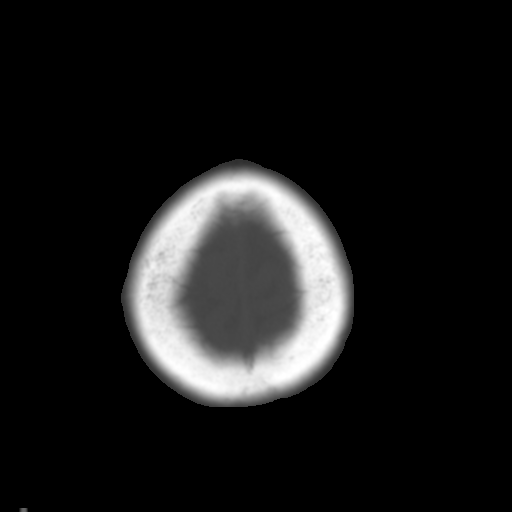
[im 28/30  brain]
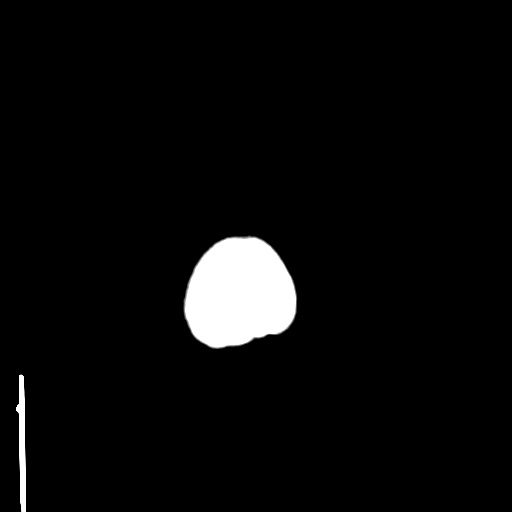

[Series 4: coronal soft tissue · coronal · 0.30mm/px · 3 of 65 slices shown]
[im 22/65  brain]
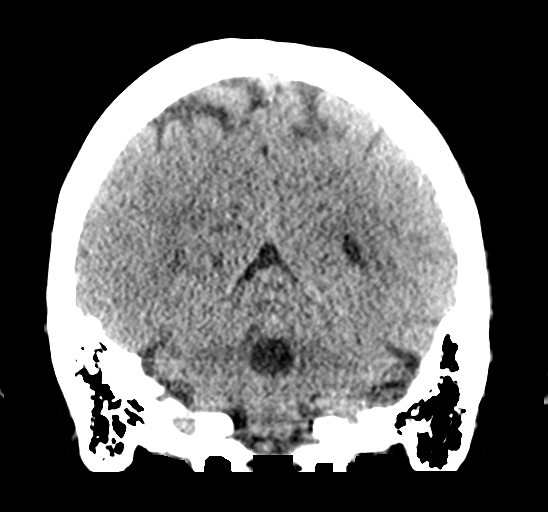
[im 29/65  brain]
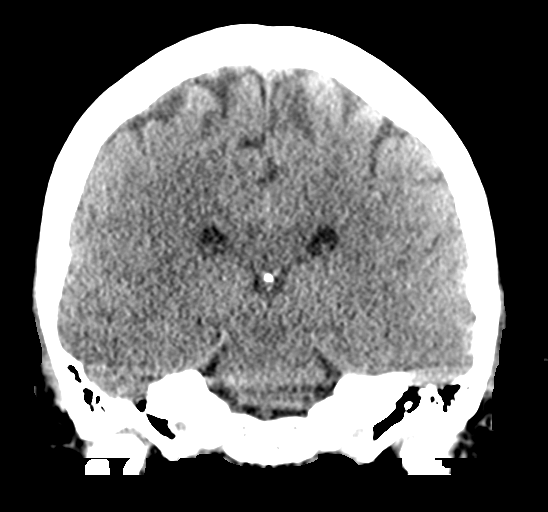
[im 36/65  brain]
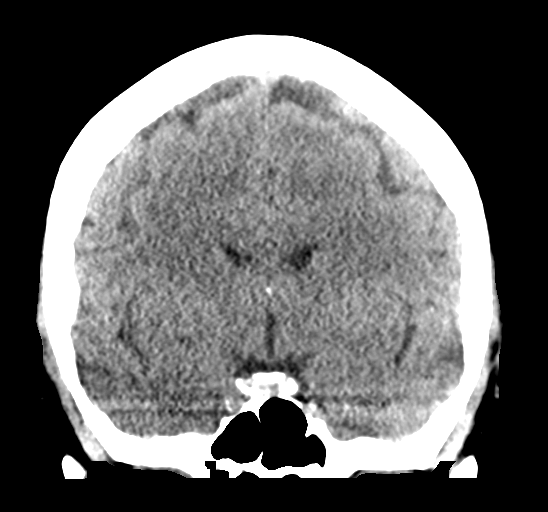

[Series 5: sagittal soft tissue · sagittal · 0.30mm/px · 3 of 52 slices shown]
[im 18/52  brain]
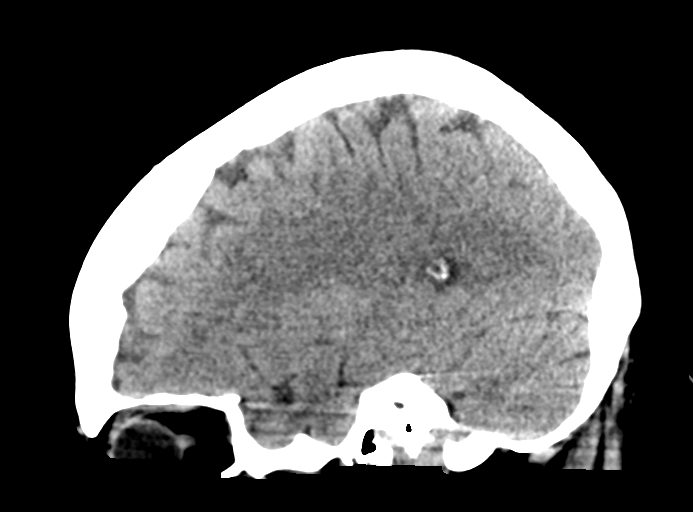
[im 26/52  brain]
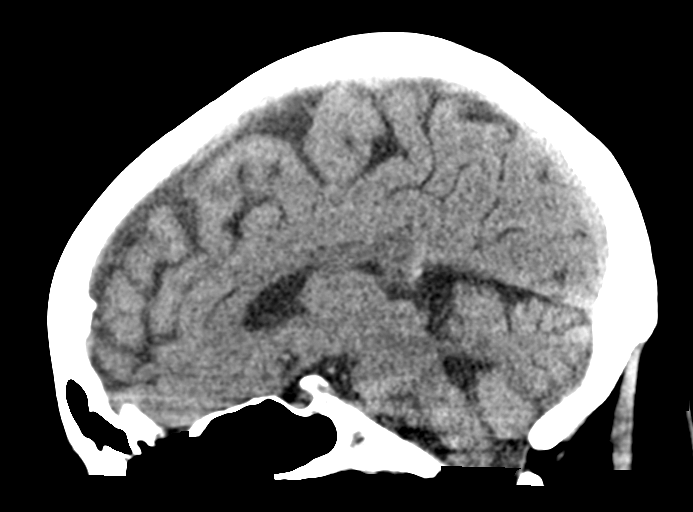
[im 35/52  brain]
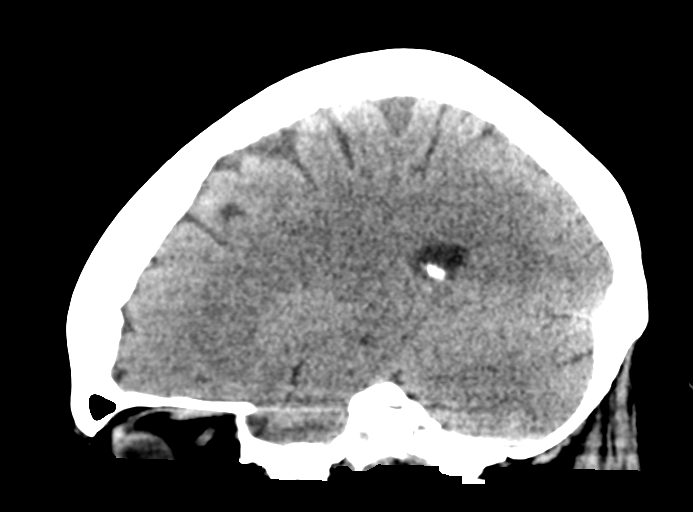

[16 of 47 positions shown; findings below may reference images not displayed]

FINDINGS: Brain: No evidence of acute infarction, hemorrhage, hydrocephalus,
extra-axial collection or mass lesion/mass effect.

Vascular: No hyperdense vessel or unexpected calcification.

Skull: Normal. Negative for fracture or focal lesion.

Sinuses/Orbits: No acute finding.

Other: None.
IMPRESSION: 1. Normal noncontrast head CT.

## 2023-07-16 ENCOUNTER — Other Ambulatory Visit (HOSPITAL_COMMUNITY): Payer: Self-pay

## 2023-09-07 ENCOUNTER — Other Ambulatory Visit: Payer: Self-pay

## 2023-12-07 IMAGING — US US ABDOMEN COMPLETE
1 series · 14 of 25 positions shown · non-contrast
Comparison: March 20, 2019

CLINICAL DATA: Thrombocytopenia.

EXAM:
ABDOMEN ULTRASOUND COMPLETE

[Series 1: us abdomen complete · 14 of 123 slices shown]
[im 1/123]
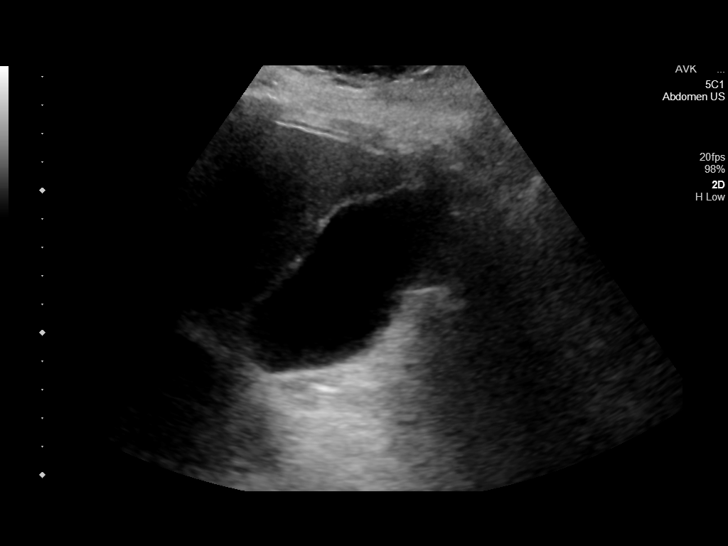
[im 11/123]
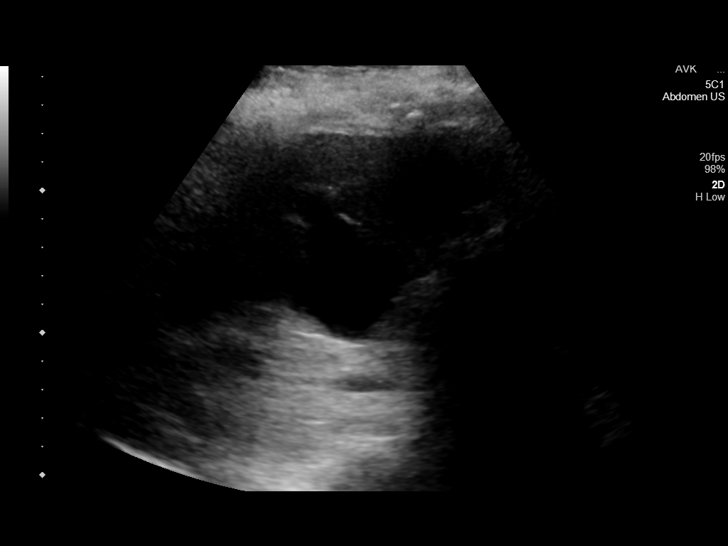
[im 21/123]
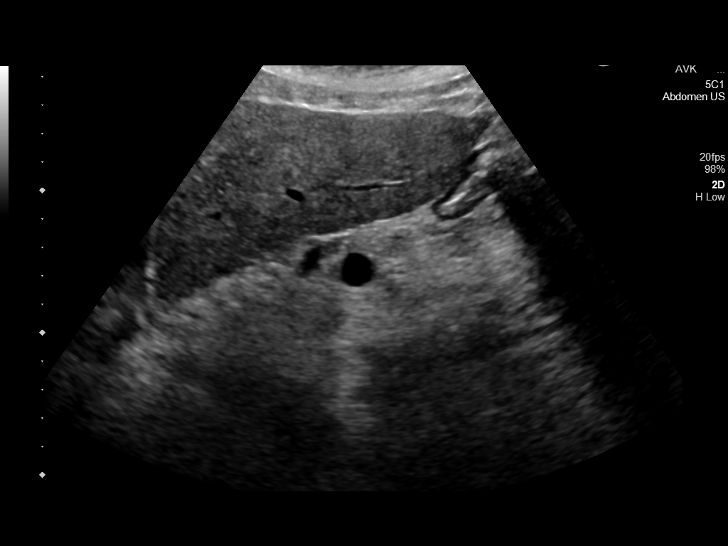
[im 31/123]
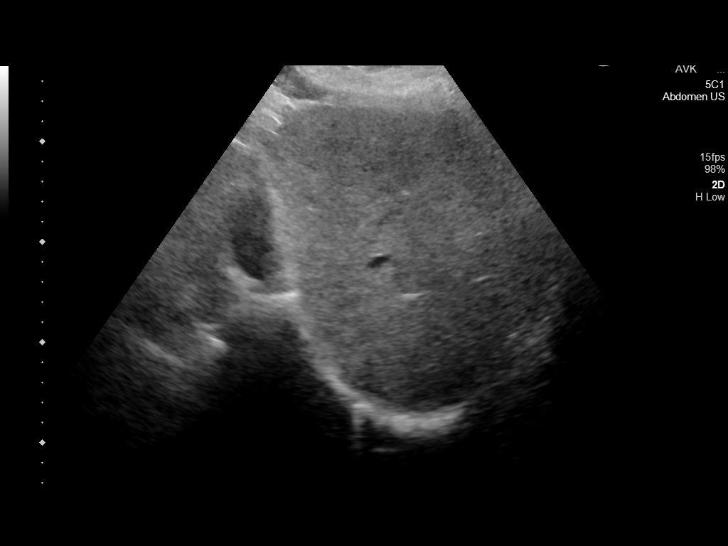
[im 41/123]
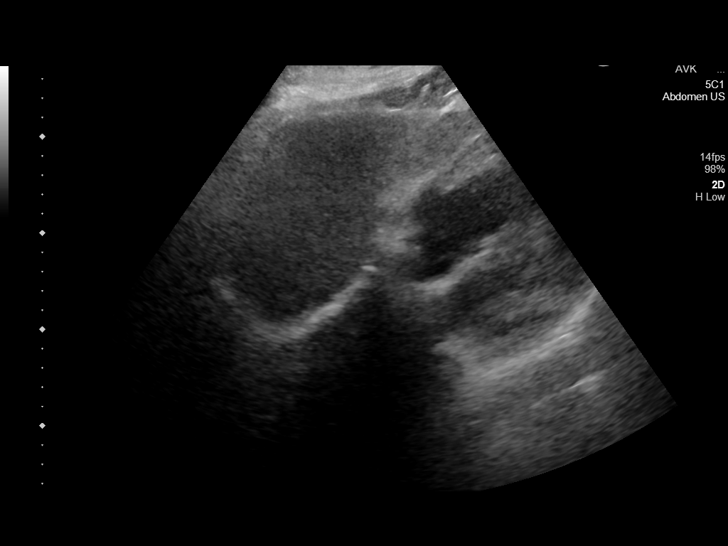
[im 46/123]
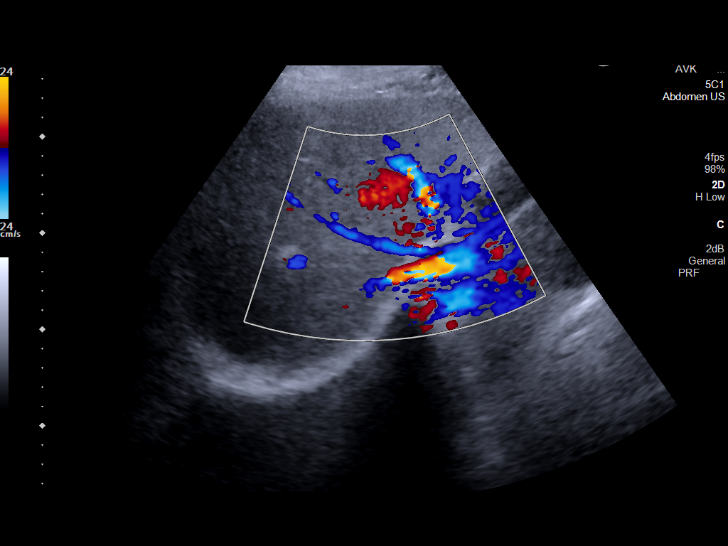
[im 56/123]
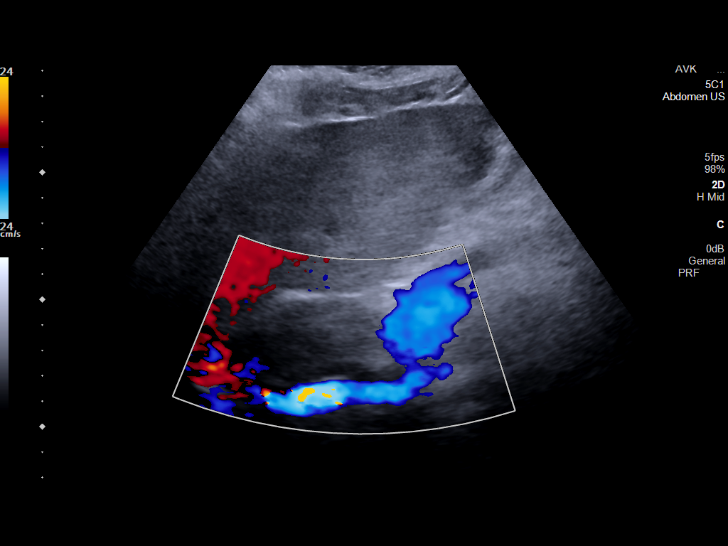
[im 67/123]
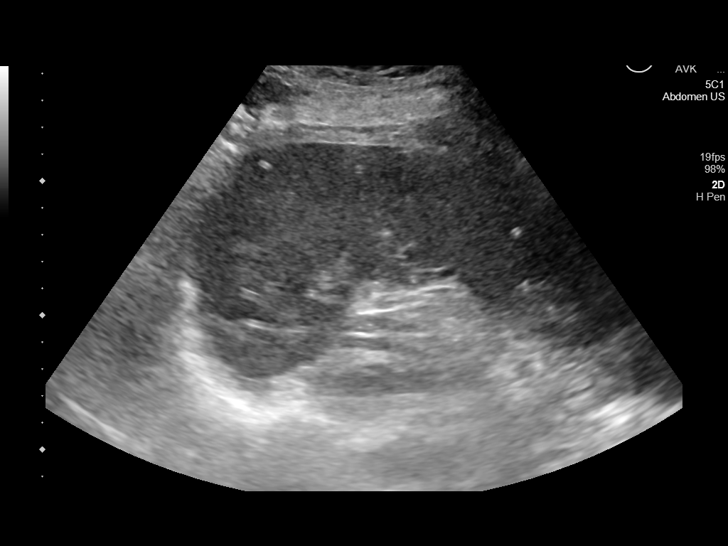
[im 77/123]
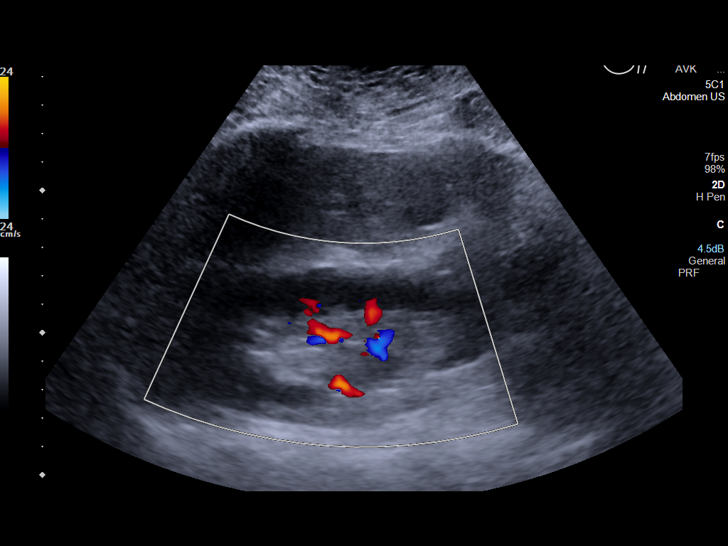
[im 82/123]
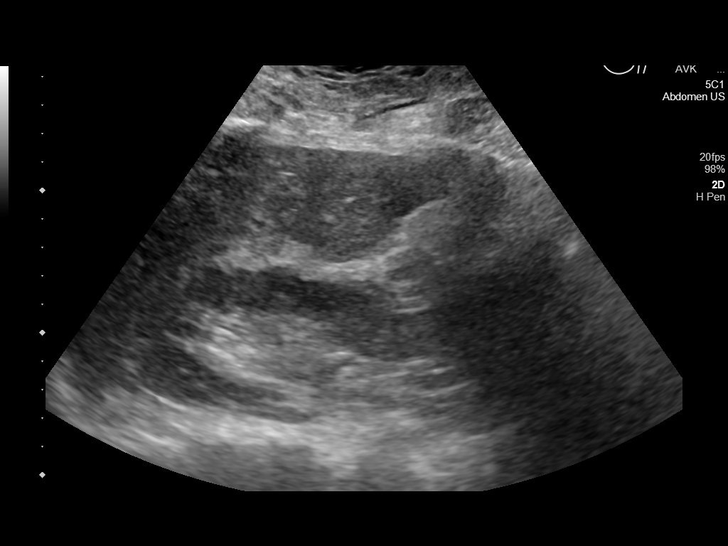
[im 92/123]
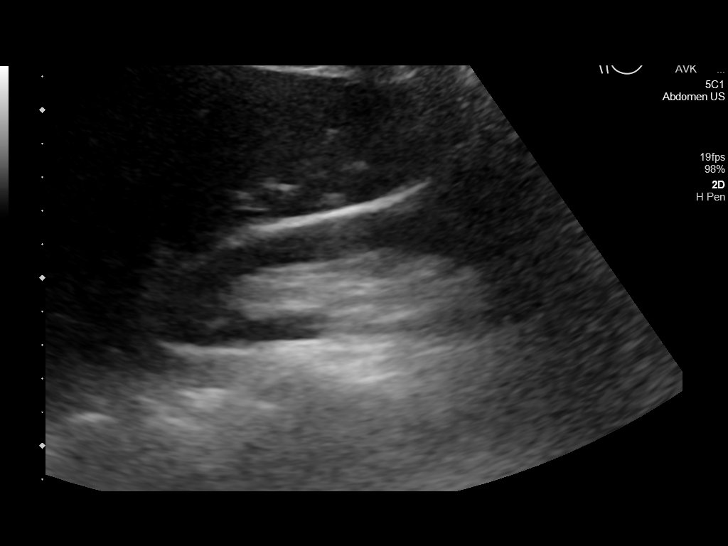
[im 102/123]
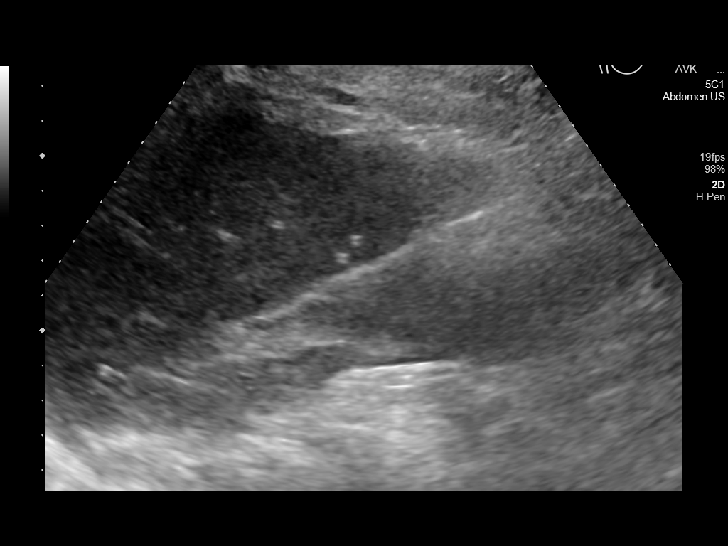
[im 112/123]
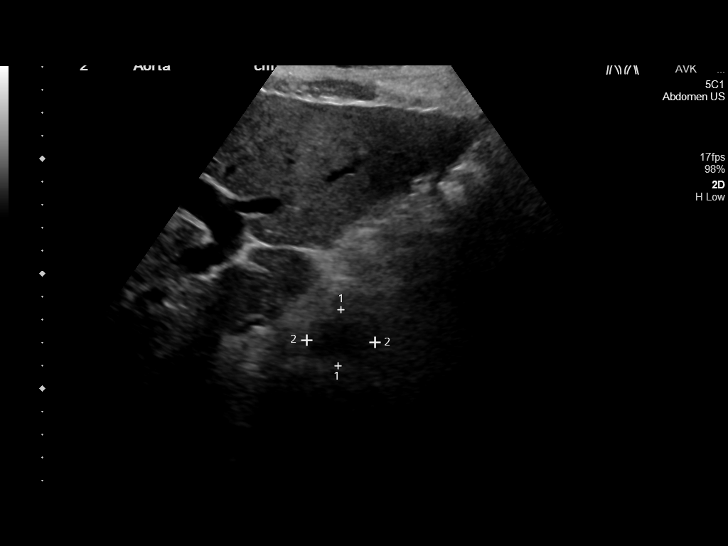
[im 123/123]
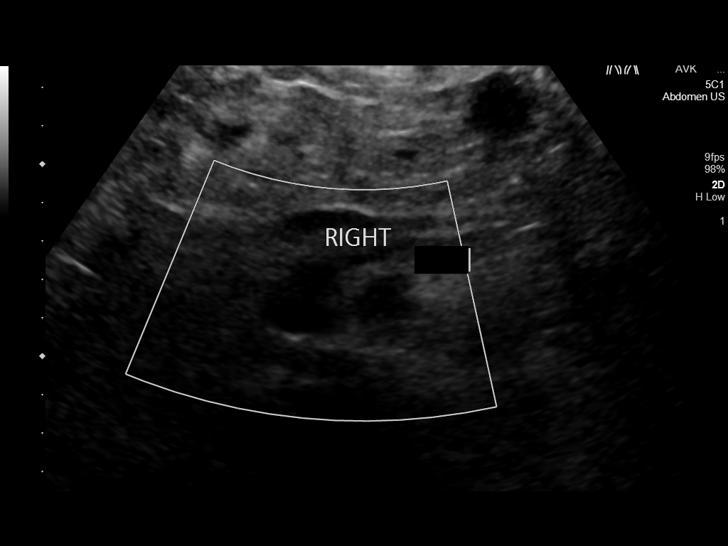

[14 of 25 positions shown; findings below may reference images not displayed]

FINDINGS: Gallbladder: No gallstones or wall thickening visualized (2.3 mm).
No sonographic Murphy sign noted by sonographer.

Common bile duct: Diameter: 3.3 mm

Liver: No focal lesion identified. The liver parenchyma is nodular
in echotexture and diffusely increased in echogenicity. Portal vein
is patent on color Doppler imaging with normal direction of blood
flow towards the liver.

IVC: No abnormality visualized.

Pancreas: Visualized portion unremarkable.

Spleen: The spleen measures 16.2 cm in length and contains numerous
echogenic calcified granulomas.

Right Kidney: Length: 10.7 cm. Echogenicity within normal limits. No
mass or hydronephrosis visualized.

Left Kidney: Length: 10.9 cm. Echogenicity within normal limits. No
mass or hydronephrosis visualized.

Abdominal aorta: No aneurysm visualized (2.6 cm in AP diameter).

Other findings: None.
IMPRESSION: 1. Hepatic steatosis and additional findings suggestive of hepatic
cirrhosis, without focal liver lesions.
2. Multiple calcified granulomas within an enlarged spleen.
